# Patient Record
Sex: Female | Born: 1940 | Race: White | Hispanic: No | Marital: Married | Smoking: Former smoker
Health system: Southern US, Community
[De-identification: ages and names within clinical notes are randomized; demographics above are authoritative.]

## PROBLEM LIST (undated history)

## (undated) DIAGNOSIS — I2510 Atherosclerotic heart disease of native coronary artery without angina pectoris: Secondary | ICD-10-CM

## (undated) DIAGNOSIS — J449 Chronic obstructive pulmonary disease, unspecified: Secondary | ICD-10-CM

## (undated) DIAGNOSIS — K219 Gastro-esophageal reflux disease without esophagitis: Secondary | ICD-10-CM

## (undated) DIAGNOSIS — R635 Abnormal weight gain: Secondary | ICD-10-CM

## (undated) DIAGNOSIS — J398 Other specified diseases of upper respiratory tract: Secondary | ICD-10-CM

## (undated) DIAGNOSIS — G4733 Obstructive sleep apnea (adult) (pediatric): Secondary | ICD-10-CM

## (undated) DIAGNOSIS — Z9289 Personal history of other medical treatment: Secondary | ICD-10-CM

## (undated) DIAGNOSIS — E119 Type 2 diabetes mellitus without complications: Secondary | ICD-10-CM

## (undated) DIAGNOSIS — M1990 Unspecified osteoarthritis, unspecified site: Secondary | ICD-10-CM

## (undated) DIAGNOSIS — K5790 Diverticulosis of intestine, part unspecified, without perforation or abscess without bleeding: Secondary | ICD-10-CM

## (undated) DIAGNOSIS — I10 Essential (primary) hypertension: Secondary | ICD-10-CM

## (undated) DIAGNOSIS — IMO0001 Reserved for inherently not codable concepts without codable children: Secondary | ICD-10-CM

## (undated) DIAGNOSIS — Z9981 Dependence on supplemental oxygen: Secondary | ICD-10-CM

## (undated) DIAGNOSIS — E785 Hyperlipidemia, unspecified: Secondary | ICD-10-CM

## (undated) HISTORY — DX: Reserved for inherently not codable concepts without codable children: IMO0001

## (undated) HISTORY — DX: Obstructive sleep apnea (adult) (pediatric): G47.33

## (undated) HISTORY — DX: Diverticulosis of intestine, part unspecified, without perforation or abscess without bleeding: K57.90

## (undated) HISTORY — DX: Essential (primary) hypertension: I10

## (undated) HISTORY — DX: Hyperlipidemia, unspecified: E78.5

## (undated) HISTORY — DX: Atherosclerotic heart disease of native coronary artery without angina pectoris: I25.10

## (undated) HISTORY — PX: TUBAL LIGATION: SHX77

## (undated) HISTORY — DX: Type 2 diabetes mellitus without complications: E11.9

## (undated) HISTORY — DX: Abnormal weight gain: R63.5

## (undated) HISTORY — DX: Gastro-esophageal reflux disease without esophagitis: K21.9

---

## 1997-11-26 ENCOUNTER — Emergency Department (HOSPITAL_COMMUNITY): Admission: EM | Admit: 1997-11-26 | Discharge: 1997-11-26 | Payer: Self-pay | Admitting: Emergency Medicine

## 1999-01-24 ENCOUNTER — Encounter: Payer: Self-pay | Admitting: Internal Medicine

## 1999-01-24 ENCOUNTER — Ambulatory Visit (HOSPITAL_COMMUNITY): Admission: RE | Admit: 1999-01-24 | Discharge: 1999-01-24 | Payer: Self-pay | Admitting: Internal Medicine

## 1999-01-28 ENCOUNTER — Ambulatory Visit (HOSPITAL_COMMUNITY): Admission: RE | Admit: 1999-01-28 | Discharge: 1999-01-28 | Payer: Self-pay | Admitting: Internal Medicine

## 1999-03-09 ENCOUNTER — Other Ambulatory Visit: Admission: RE | Admit: 1999-03-09 | Discharge: 1999-03-09 | Payer: Self-pay | Admitting: Obstetrics and Gynecology

## 1999-03-09 ENCOUNTER — Encounter (INDEPENDENT_AMBULATORY_CARE_PROVIDER_SITE_OTHER): Payer: Self-pay

## 2004-09-29 ENCOUNTER — Encounter: Admission: RE | Admit: 2004-09-29 | Discharge: 2004-09-29 | Payer: Self-pay | Admitting: Family Medicine

## 2005-10-05 ENCOUNTER — Encounter: Admission: RE | Admit: 2005-10-05 | Discharge: 2005-10-05 | Payer: Self-pay | Admitting: Family Medicine

## 2006-08-17 ENCOUNTER — Emergency Department (HOSPITAL_COMMUNITY): Admission: EM | Admit: 2006-08-17 | Discharge: 2006-08-17 | Payer: Self-pay | Admitting: Emergency Medicine

## 2006-08-18 ENCOUNTER — Inpatient Hospital Stay (HOSPITAL_COMMUNITY): Admission: EM | Admit: 2006-08-18 | Discharge: 2006-08-24 | Payer: Self-pay | Admitting: Emergency Medicine

## 2006-08-18 ENCOUNTER — Ambulatory Visit: Payer: Self-pay | Admitting: Pulmonary Disease

## 2006-08-20 ENCOUNTER — Encounter: Payer: Self-pay | Admitting: Emergency Medicine

## 2006-08-21 ENCOUNTER — Encounter (INDEPENDENT_AMBULATORY_CARE_PROVIDER_SITE_OTHER): Payer: Self-pay | Admitting: Internal Medicine

## 2006-08-23 ENCOUNTER — Encounter: Payer: Self-pay | Admitting: Pulmonary Disease

## 2006-09-07 ENCOUNTER — Ambulatory Visit: Payer: Self-pay | Admitting: Pulmonary Disease

## 2006-09-19 ENCOUNTER — Ambulatory Visit: Payer: Self-pay | Admitting: Cardiology

## 2006-09-25 ENCOUNTER — Ambulatory Visit: Payer: Self-pay | Admitting: Pulmonary Disease

## 2006-09-26 ENCOUNTER — Encounter: Payer: Self-pay | Admitting: Pulmonary Disease

## 2006-09-26 ENCOUNTER — Ambulatory Visit (HOSPITAL_BASED_OUTPATIENT_CLINIC_OR_DEPARTMENT_OTHER): Admission: RE | Admit: 2006-09-26 | Discharge: 2006-09-26 | Payer: Self-pay | Admitting: Pulmonary Disease

## 2006-10-03 ENCOUNTER — Ambulatory Visit: Payer: Self-pay | Admitting: Pulmonary Disease

## 2006-10-08 ENCOUNTER — Ambulatory Visit: Payer: Self-pay

## 2006-10-08 ENCOUNTER — Encounter: Payer: Self-pay | Admitting: Cardiology

## 2006-10-16 ENCOUNTER — Ambulatory Visit: Admission: RE | Admit: 2006-10-16 | Discharge: 2006-10-16 | Payer: Self-pay | Admitting: Critical Care Medicine

## 2006-10-16 ENCOUNTER — Encounter: Payer: Self-pay | Admitting: Emergency Medicine

## 2006-10-16 ENCOUNTER — Ambulatory Visit: Payer: Self-pay | Admitting: Critical Care Medicine

## 2006-10-24 ENCOUNTER — Ambulatory Visit: Payer: Self-pay | Admitting: Pulmonary Disease

## 2006-10-26 ENCOUNTER — Ambulatory Visit: Admission: RE | Admit: 2006-10-26 | Discharge: 2006-10-26 | Payer: Self-pay | Admitting: *Deleted

## 2006-10-26 ENCOUNTER — Ambulatory Visit: Payer: Self-pay | Admitting: Pulmonary Disease

## 2006-10-26 ENCOUNTER — Encounter: Payer: Self-pay | Admitting: Pulmonary Disease

## 2006-10-31 ENCOUNTER — Ambulatory Visit: Payer: Self-pay | Admitting: Critical Care Medicine

## 2006-11-26 ENCOUNTER — Ambulatory Visit: Payer: Self-pay | Admitting: Pulmonary Disease

## 2006-12-04 ENCOUNTER — Encounter (HOSPITAL_COMMUNITY): Admission: RE | Admit: 2006-12-04 | Discharge: 2007-02-12 | Payer: Self-pay | Admitting: Pulmonary Disease

## 2006-12-04 ENCOUNTER — Encounter: Payer: Self-pay | Admitting: Pulmonary Disease

## 2006-12-31 ENCOUNTER — Telehealth: Payer: Self-pay | Admitting: Pulmonary Disease

## 2007-01-01 DIAGNOSIS — R635 Abnormal weight gain: Secondary | ICD-10-CM | POA: Insufficient documentation

## 2007-01-01 DIAGNOSIS — J8410 Pulmonary fibrosis, unspecified: Secondary | ICD-10-CM | POA: Insufficient documentation

## 2007-01-01 DIAGNOSIS — G4733 Obstructive sleep apnea (adult) (pediatric): Secondary | ICD-10-CM | POA: Insufficient documentation

## 2007-01-07 ENCOUNTER — Telehealth: Payer: Self-pay | Admitting: Pulmonary Disease

## 2007-01-08 ENCOUNTER — Telehealth (INDEPENDENT_AMBULATORY_CARE_PROVIDER_SITE_OTHER): Payer: Self-pay | Admitting: *Deleted

## 2007-01-15 ENCOUNTER — Telehealth (INDEPENDENT_AMBULATORY_CARE_PROVIDER_SITE_OTHER): Payer: Self-pay | Admitting: *Deleted

## 2007-02-14 ENCOUNTER — Encounter (HOSPITAL_COMMUNITY): Admission: RE | Admit: 2007-02-14 | Discharge: 2007-05-09 | Payer: Self-pay | Admitting: Pulmonary Disease

## 2007-02-15 ENCOUNTER — Ambulatory Visit: Payer: Self-pay | Admitting: Pulmonary Disease

## 2007-02-15 DIAGNOSIS — Q318 Other congenital malformations of larynx: Secondary | ICD-10-CM | POA: Insufficient documentation

## 2007-02-15 DIAGNOSIS — Q349 Congenital malformation of respiratory system, unspecified: Secondary | ICD-10-CM

## 2007-02-15 LAB — CONVERTED CEMR LAB
BUN: 19 mg/dL (ref 6–23)
CO2: 35 meq/L — ABNORMAL HIGH (ref 19–32)
Calcium: 10.2 mg/dL (ref 8.4–10.5)
Chloride: 103 meq/L (ref 96–112)
Creatinine, Ser: 0.5 mg/dL (ref 0.4–1.2)

## 2007-04-02 ENCOUNTER — Ambulatory Visit (HOSPITAL_COMMUNITY): Admission: RE | Admit: 2007-04-02 | Discharge: 2007-04-02 | Payer: Self-pay | Admitting: Pulmonary Disease

## 2007-04-03 ENCOUNTER — Encounter: Payer: Self-pay | Admitting: Pulmonary Disease

## 2007-04-22 ENCOUNTER — Ambulatory Visit: Payer: Self-pay | Admitting: Pulmonary Disease

## 2007-09-09 ENCOUNTER — Encounter: Admission: RE | Admit: 2007-09-09 | Discharge: 2007-09-09 | Payer: Self-pay | Admitting: Family Medicine

## 2007-10-14 ENCOUNTER — Ambulatory Visit: Payer: Self-pay | Admitting: Pulmonary Disease

## 2008-05-19 ENCOUNTER — Ambulatory Visit: Payer: Self-pay | Admitting: Pulmonary Disease

## 2008-05-20 ENCOUNTER — Ambulatory Visit: Payer: Self-pay | Admitting: Cardiology

## 2008-05-21 ENCOUNTER — Telehealth: Payer: Self-pay | Admitting: Pulmonary Disease

## 2008-09-09 ENCOUNTER — Encounter: Admission: RE | Admit: 2008-09-09 | Discharge: 2008-09-09 | Payer: Self-pay | Admitting: Family Medicine

## 2008-11-27 ENCOUNTER — Ambulatory Visit: Payer: Self-pay | Admitting: Pulmonary Disease

## 2009-03-08 ENCOUNTER — Encounter: Payer: Self-pay | Admitting: Pulmonary Disease

## 2009-04-17 ENCOUNTER — Emergency Department (HOSPITAL_COMMUNITY): Admission: EM | Admit: 2009-04-17 | Discharge: 2009-04-17 | Payer: Self-pay | Admitting: Family Medicine

## 2009-06-01 ENCOUNTER — Ambulatory Visit: Payer: Self-pay | Admitting: Pulmonary Disease

## 2009-06-29 ENCOUNTER — Ambulatory Visit: Payer: Self-pay | Admitting: Pulmonary Disease

## 2009-06-30 DIAGNOSIS — R490 Dysphonia: Secondary | ICD-10-CM | POA: Insufficient documentation

## 2009-07-27 ENCOUNTER — Ambulatory Visit: Payer: Self-pay | Admitting: Pulmonary Disease

## 2009-09-10 ENCOUNTER — Encounter: Admission: RE | Admit: 2009-09-10 | Discharge: 2009-09-10 | Payer: Self-pay | Admitting: Family Medicine

## 2009-09-15 ENCOUNTER — Encounter: Admission: RE | Admit: 2009-09-15 | Discharge: 2009-09-15 | Payer: Self-pay | Admitting: Family Medicine

## 2010-03-06 ENCOUNTER — Encounter: Payer: Self-pay | Admitting: Pulmonary Disease

## 2010-03-15 NOTE — Assessment & Plan Note (Signed)
Summary: ROV 4 MONTHS///KP   Primary Provider/Referring Provider:  Dr. Lynnea Ferrier, [facility]  CC:  Pt is here for a routine f/u appt.  Pt states she is using her bipap machine every night.  Pt denied any complaints with bipap machine.  Pt states breathing is unchanged from last visit.  Pt does c/o hoarseness x 1 month.   Pt states occ non-productive cough. .  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 Improved with BiPAP. HRCT 4/10 did not show any evidence of pulmonary fibrosis 8/09 Desaturates on exertion. walks on a treadmill x 30 mins  4/10 >>Dyspnea at baseline. Still unable to lie flat but can sleep with CPAP. Heartburn 'not bad'. Has changed to generic metformin 1000 two times a day _-nausea & lisinopril for avapro (doughnut hole) Cough 'sometimes', hoarse voice  June 01, 2009 3:50 PM  c/o hoarseness, otherwise doing well. Could not afford avapro Still working 4 h/day, continues with exercise regimen 5 ds/ wk Compliant with BiPAP, cannot lie flat for longer duration without it. Did not desaturate on exertion   Current Medications (verified): 1)  Simvastatin 80 Mg  Tabs (Simvastatin) .... Take 1 Tab By Mouth At Bedtime 2)  Metformin Hcl 500 Mg Tabs (Metformin Hcl) .... Take 1 Tablet By Mouth Two Times A Day 3)  Gabapentin 600 Mg  Tabs (Gabapentin) .... Take 1 Tablet By Mouth Four Times A Day 4)  Adult Aspirin Low Strength 81 Mg  Tbdp (Aspirin) .... Take 1 Tablet By Mouth Once A Day 5)  Oscal 500/200 D-3 500-200 Mg-Unit  Tabs (Calcium-Vitamin D) .... Take 1 Tablet By Mouth Once A Day 6)  Spiriva Handihaler 18 Mcg  Caps (Tiotropium Bromide Monohydrate) .Marland Kitchen.. 1 Capsule Once Daily 7)  Vitamin C .... As Needed 8)  Antioxidant A/c/e   Tabs (Multiple Vitamin) .... Once Daily 9)  Bipap .... +10/5 10)  Lisinopril 20 Mg Tabs (Lisinopril) .... Take 1 Tablet By Mouth  Once A Day 11)  Vitamin B-12 1000 Mcg Tabs (Cyanocobalamin) .... Take 1 Tablet By Mouth Once A Day 12)  Vitamin B-6 100 Mg Tabs (Pyridoxine Hcl) .... Take 1 Tablet By Mouth Once A Day  Allergies (verified): No Known Drug Allergies  Past History:  Social History: Last updated: 06/01/2009 quit smoking '88. 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  A sleep study did reveal mild obstructive sleep apnea.  She has been able to use her BiPAP  +10/5 & is able to sleep supine.   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  HRCT in 2/09 >> Large hiatal hernia. Stable overall appearance of the lungs with mild nonspecific interstitial process but no acute overlying alveolitis or pulmonary edema or infiltrate.  PFTs 8/09 >> marked improvment, FEV1% was 78, FEV68 %, FVC 63%, TLC 67%, DLCO remains 51% Work exposure to chemicals - has been outlined for her school.  Social History: quit smoking '88. max 1.5 PPD, started @ 19 = 20 Pyrs  Review of Systems  The patient complains of dyspnea on exertion.  The patient denies anorexia, fever, weight loss, weight gain, vision loss, decreased hearing, hoarseness, chest pain, syncope, peripheral edema, prolonged cough, headaches, hemoptysis, abdominal pain, melena, hematochezia, severe indigestion/heartburn, hematuria, muscle weakness, difficulty walking, depression, unusual weight change, and abnormal bleeding.    Vital Signs:  Patient profile:   70 year old female Height:      62 inches Weight:      181.13  pounds BMI:     33.25 O2 Sat:      94 % on Room air Temp:     98.1 degrees F oral Pulse rate:   97 / minute BP sitting:   118 / 72  (right arm) Cuff size:   regular  Vitals Entered By: Arman Filter LPN (June 01, 2009 3:39 PM)  O2 Flow:  Room air  Serial Vital Signs/Assessments:  Comments: 4:38 PM Ambulatory Pulse Oximetry  Resting; HR__101___    02 Sat__96%ra___  Lap1 (185 feet)   HR___108__   02 Sat___96%ra__ Lap2 (185 feet)   HR___113__   02 Sat__93%ra___    Lap3 (185 feet)   HR___112__   02 Sat__95%ra___  _X_Test Completed without Difficulty ___Test Stopped due to:  Arman Filter LPN  June 01, 2009 4:39 PM  By: Arman Filter LPN   CC: Pt is here for a routine f/u appt.  Pt states she is using her bipap machine every night.  Pt denied any complaints with bipap machine.  Pt states breathing is unchanged from last visit.  Pt does c/o hoarseness x 1 month.   Pt states occ non-productive cough.  Comments Medications reviewed with patient Arman Filter LPN  June 01, 2009 3:43 PM    Physical Exam  Additional Exam:  Gen. 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 Patient Level III (57846) Prescription Created Electronically [phone]) Pulse Oximetry, Ambulatory (28413)  Problem # 2:  HYPERTENSION (ICD-401.9)  Change to atenolol FU in 1 mnth for BP chk The following medications were removed from the medication list:     Lisinopril 20 Mg Tabs (Lisinopril) .Marland Kitchen... Take 1 tablet by mouth once a day Her updated medication list for this problem includes:    Atenolol 25 Mg Tabs (Atenolol) ..... Once daily  Orders: Est. Patient Level III (24401) Prescription Created Electronically [phone] Pulse Oximetry, Ambulatory (36644)  Medications Added to Medication List This Visit: 1)  Metformin Hcl 500 Mg Tabs (Metformin hcl) .... Take 1 tablet by mouth two times a day 2)  Atenolol 25 Mg Tabs (Atenolol) .... Once daily  Patient Instructions: 1)  Copy sent I[facility]:[id number] [place] [facility] 2)  Please schedule a follow-up appointment in 1 month with TP 3)  STOP lisinopril 4)  Use atenolol 25 once daily , check BP every other day , call me if BP more than 140/90 Prescriptions: ATENOLOL 25 MG TABS (ATENOLOL) once daily  #30 x 1   Entered and Authorized by:   Comer Locket Vassie Loll MD   Signed  by:   Comer Locket Vassie Loll MD on 06/01/2009   Method used:   [facility]        [facility]  [address] [id number]       [address]       [place], [place]  [address]       Ph: [phone]       Fax: [phone]   RxID:   [id number]

## 2010-03-15 NOTE — Assessment & Plan Note (Signed)
Summary: NP follow up - BP med change   Primary Provider/Referring Provider:  Dr. Lynnea Ferrier, [facility]  CC:  1 month follow up for med change to atenolol - pt states she is still hoarse.  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 Improved with BiPAP. HRCT 4/10 did not show any evidence of pulmonary fibrosis 8/09 Desaturates on exertion. walks on a treadmill x 30 mins  4/10 >>Dyspnea at baseline. Still unable to lie flat but can sleep with CPAP. Heartburn 'not bad'. Has changed to generic metformin 1000 two times a day _-nausea & lisinopril for avapro (doughnut hole) Cough 'sometimes', hoarse voice  June 01, 2009 3:50 PM  c/o hoarseness, otherwise doing well. Could not afford avapro Still working 4 h/day, continues with exercise regimen 5 ds/ wk Compliant with BiPAP, cannot lie flat for longer duration without it. Did not desaturate on exertion  May 17, 201--Returns for 1 month follow up for med change to atenolol. Last visit w/ chnaged off ACE inhibitor Lisinopril to Atenolol due to hoarseness. She is some better , hoarsenss is not completely gone but better. She does have some drainage intermittently. Denies chest pain, dyspnea, orthopnea, hemoptysis, fever, n/v/d, edema, headache.    Medications Prior to Update: 1)  Simvastatin 80 Mg  Tabs (Simvastatin) .... Take 1 Tab By Mouth At Bedtime 2)  Metformin Hcl 500 Mg Tabs (Metformin Hcl) .... Take 1 Tablet By Mouth Two Times A Day 3)  Gabapentin 600 Mg  Tabs (Gabapentin) .... Take 1 Tablet By Mouth Four Times A Day 4)  Adult Aspirin Low Strength 81 Mg  Tbdp (Aspirin) .... Take 1 Tablet By Mouth Once A Day 5)  Oscal 500/200 D-3 500-200 Mg-Unit  Tabs (Calcium-Vitamin D) .... Take 1 Tablet By Mouth Once A Day 6)  Spiriva Handihaler 18 Mcg  Caps (Tiotropium Bromide Monohydrate) .Marland Kitchen.. 1 Capsule Once Daily 7)  Vitamin  C .... As Needed 8)  Antioxidant A/c/e   Tabs (Multiple Vitamin) .... Once Daily 9)  Bipap .... +10/5 10)  Vitamin B-12 1000 Mcg Tabs (Cyanocobalamin) .... Take 1 Tablet By Mouth Once A Day 11)  Vitamin B-6 100 Mg Tabs (Pyridoxine Hcl) .... Take 1 Tablet By Mouth Once A Day 12)  Atenolol 25 Mg Tabs (Atenolol) .... Once Daily  Allergies: No Known Drug Allergies  Past History:  Past Medical History: Last updated: 06/01/2009 Current Problems:  WEIGHT GAIN (ICD-783.1) OBSTRUCTIVE SLEEP APNEA (ICD-327.23) Hypertension  Family History: Last updated: 06/29/2009 asthma - MGM heart disease - mother, MGM, MGF clotting disorders - mother DM - brother, pat aunts x4  Social History: Last updated: 06/29/2009 quit smoking '88. 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  A sleep study did reveal mild obstructive sleep apnea.  She has been able to use her BiPAP  +10/5 & is able to sleep supine.   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  HRCT in 2/09 >> Large hiatal hernia. Stable  overall appearance of the lungs with mild nonspecific interstitial process but no acute overlying alveolitis or pulmonary edema or infiltrate.  PFTs 8/09 >> marked improvment, FEV1% was 78, FEV68 %, FVC 63%, TLC 67%, DLCO remains 51% Work exposure to chemicals - has been outlined for her school.  Family History: asthma - MGM heart disease - mother, MGM, MGF clotting disorders - mother DM - brother, pat aunts x4  Social History: quit  smoking '88. 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 Yes Comments Medications reviewed with patient Daytime contact number verified with patient. Boone Master CNA  Jun 29, 2009 3:50 PM    Physical Exam  Additional Exam:  Gen. Pleasant, well-nourished, in no distress, mild hoarseness, ENT - no lesions, no post nasal drip Neck: No JVD, no thyromegaly, no carotid bruits Lungs: no use of accessory muscles, no dullness to percussion,clear, no rhonchi  Cardiovascular: Rhythm regular, heart sounds  normal, no murmurs or gallops, no peripheral edema Musculoskeletal: No deformities, no cyanosis or clubbing       Impression & Recommendations:  Problem # 1:  HYPERTENSION (ICD-401.9)  controlled w/ recent change in meds . Now off ace inhibitor.  cont on current meds.   Her updated medication list for this problem includes:    Atenolol 25 Mg Tabs (Atenolol) ..... Once daily  Orders: Est. Patient Level IV (16109)  Problem # 2:  HOARSENESS (UEA-540.98)   ? etiology possible 2/2 ACE vs rhinitis vs GERD REC: if not improving may need referral to ENT to evaluate Zyrtec 10mg  at bedtime for 1 week then as needed for draiange Saline nasal rinses as needed nasal congestion  Dexilant 60mg  once daily until seen back in office .  Continue on Atenolol once daily  Avoid clearing throat, use sugarless candy, ice chips and water. NO MINTS.  follow up  3 weeks Nickole Adamek NP  Please contact office for sooner follow up if symptoms do not improve or worsen   Orders: Est. Patient Level IV (11914)  Complete Medication List: 1)  Simvastatin 80 Mg Tabs (Simvastatin) .... Take 1 tab by mouth at bedtime 2)  Metformin Hcl 500 Mg Tabs (Metformin hcl) .... Take 1 tablet by mouth two times a day 3)  Gabapentin 600 Mg Tabs (Gabapentin) .... Take 1 tablet by mouth four times a day 4)  Adult Aspirin Low Strength 81 Mg Tbdp (Aspirin) .... Take 1 tablet by mouth once a day 5)  Oscal 500/200 D-3 500-200 Mg-unit Tabs (Calcium-vitamin d) .... Take 1 tablet by mouth once a day 6)  Spiriva Handihaler 18 Mcg Caps (Tiotropium bromide monohydrate) .Marland Kitchen.. 1 capsule once daily 7)  Vitamin C  .... As  needed 8)  Antioxidant A/c/e Tabs (Multiple vitamin) .... Once daily 9)  Bipap  .... +10/5 10)  Vitamin B-12 1000 Mcg Tabs (Cyanocobalamin) .... Take 1 tablet by mouth once a day 11)  Vitamin B-6 100 Mg Tabs (Pyridoxine hcl) .... Take 1 tablet by mouth once a day 12)  Atenolol 25 Mg Tabs (Atenolol) .... Once daily  Patient Instructions: 1)  Zyrtec 10mg  at bedtime for 1 week then as needed for draiange 2)  Saline nasal rinses as needed nasal congestion  3)  Dexilant 60mg  once daily until seen back in office .  4)  Continue on Atenolol once daily  5)  Avoid clearing throat, use sugarless candy, ice chips and water. NO MINTS.  6)  follow up 3 weeks Malakhi Markwood NP  7)  Please contact office for sooner follow up if symptoms do not improve or worsen

## 2010-03-15 NOTE — Letter (Signed)
Summary: CMN for CPAP Supplies/[facility]  CMN for CPAP Supplies/[facility]   Imported By: Sherian Rein 03/13/2009 08:19:33  _____________________________________________________________________  External Attachment:    Type:   Image     Comment:   External Document

## 2010-03-15 NOTE — Assessment & Plan Note (Signed)
Summary: NP follow up - BP med follow up   Primary Provider/Referring Provider:  Dr. Lynnea Ferrier, [facility]  CC:  3 week follow up for hoarseness w/ BP med change to atenolol.  states hoarseness has improved and believes it was from allergies.  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 Improved with BiPAP. HRCT 4/10 did not show any evidence of pulmonary fibrosis 8/09 Desaturates on exertion. walks on a treadmill x 30 mins  4/10 >>Dyspnea at baseline. Still unable to lie flat but can sleep with CPAP. Heartburn 'not bad'. Has changed to generic metformin 1000 two times a day _-nausea & lisinopril for avapro (doughnut hole) Cough 'sometimes', hoarse voice  June 01, 2009 3:50 PM  c/o hoarseness, otherwise doing well. Could not afford avapro Still working 4 h/day, continues with exercise regimen 5 ds/ wk Compliant with BiPAP, cannot lie flat for longer duration without it. Did not desaturate on exertion  May 17, 201--Returns for 1 month follow up for med change to atenolol. Last visit w/ chnaged off ACE inhibitor Lisinopril to Atenolol due to hoarseness. She is some better , hoarsenss is not completely gone but better. She does have some drainage intermittently.   July 27, 2009--Returns for 3 week follow up for hoarseness. Last visit she was doing well w/ change from ACE inhibitor to Atenolol however hoarseness did not totally resolve. She was recommended to use zyrtec and PPI for possible rhinitis and GERD. She did use dexilant for 2 weeks and zyrtec occasionally. Hoarseness is better but has not resolved. She gets it on most days-it comes and goes. Does have occasional GERD. On previous CT showed large Hiatal hernia. Denies chest pain, dyspnea, orthopnea, hemoptysis, fever, n/v/d, edema, headache, disclored mucus.   Medications Prior to Update: 1)  Simvastatin 80  Mg  Tabs (Simvastatin) .... Take 1 Tab By Mouth At Bedtime 2)  Metformin Hcl 500 Mg Tabs (Metformin Hcl) .... Take 1 Tablet By Mouth Two Times A Day 3)  Gabapentin 600 Mg  Tabs (Gabapentin) .... Take 1 Tablet By Mouth Four Times A Day 4)  Adult Aspirin Low Strength 81 Mg  Tbdp (Aspirin) .... Take 1 Tablet By Mouth Once A Day 5)  Oscal 500/200 D-3 500-200 Mg-Unit  Tabs (Calcium-Vitamin D) .... Take 1 Tablet By Mouth Once A Day 6)  Spiriva Handihaler 18 Mcg  Caps (Tiotropium Bromide Monohydrate) .Marland Kitchen.. 1 Capsule Once Daily 7)  Vitamin C .... As Needed 8)  Antioxidant A/c/e   Tabs (Multiple Vitamin) .... Once Daily 9)  Bipap .... +10/5 10)  Vitamin B-12 1000 Mcg Tabs (Cyanocobalamin) .... Take 1 Tablet By Mouth Once A Day 11)  Vitamin B-6 100 Mg Tabs (Pyridoxine Hcl) .... Take 1 Tablet By Mouth Once A Day  Allergies: No Known Drug Allergies  Past History:  Past Medical History: Last updated: 06/01/2009 Current Problems:  WEIGHT GAIN (ICD-783.1) OBSTRUCTIVE SLEEP APNEA (ICD-327.23) Hypertension  Family History: Last updated: 06/29/2009 asthma - MGM heart disease - mother, MGM, MGF clotting disorders - mother DM - brother, pat aunts x4  Social History: Last updated: 06/29/2009 quit smoking '88. 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  A sleep study did reveal mild obstructive sleep apnea.  She has been able to use her BiPAP  +10/5 & is able to sleep supine.   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  HRCT  in 2/09 >> Large hiatal hernia. Stable overall appearance of the lungs with mild nonspecific interstitial process but no acute overlying alveolitis or pulmonary edema or infiltrate.  PFTs 8/09 >> marked improvment, FEV1% was 78, FEV68 %, FVC 63%, TLC 67%, DLCO remains 51% Work exposure to chemicals - has been outlined for her school.  Review of Systems      See HPI  Vital Signs:  Patient profile:   70 year old female Height:      62 inches Weight:      178 pounds BMI:     32.67 O2 Sat:      92 % on Room air Temp:     97.6 degrees F oral Pulse rate:   68 / minute BP sitting:   108 / 72  (left arm) Cuff size:   regular  Vitals Entered By: Boone Master CNA/MA (July 27, 2009 9:55 AM)  O2 Flow:  Room air CC: 3 week follow up for hoarseness w/ BP med change to atenolol.  states hoarseness has improved and believes it was from allergies Is Patient Diabetic? No Comments Medications reviewed with patient Daytime contact number verified with patient. Boone Master CNA/MA  July 27, 2009 9:56 AM  .p  Physical Exam  Additional Exam:  Gen. Pleasant, well-nourished, in no distress, mild hoarseness, ENT - no lesions in post pharynx, clear drainage noted. nontender sinus, TM normal.  Neck: No JVD, no thyromegaly, no carotid bruits, no adenopathy paplataed.  Lungs: no use of accessory muscles, no dullness to percussion,clear, no rhonchi  Cardiovascular: Rhythm regular, heart sounds  normal, no murmurs or gallops, no peripheral edema ABD: soft and nontender  Musculoskeletal: No deformities, no cyanosis or clubbing       Impression & Recommendations:  Problem # 1:  HOARSENESS (ONG-295.28)   ? etiology . she is improved but not totally resolved w/ discontinuation of ACE.  I do not think she has maximized her rhinitis/ GERD prevention regimen.  However she is a former smoker, will refer to  ENT to evaluate for persistent hoarseness.  REC:  Zyrtec 10mg  at bedtime for 1 week then as needed  for draiange Saline nasal rinses as needed nasal congestion  Prilosec daily , GERD diet  .  Continue on Atenolol once daily  Avoid clearing throat, use sugarless candy, ice chips and water. NO MINTS.     Orders: ENT Referral (ENT) Est. Patient Level IV (41324)  Problem # 2:  HYPERTENSION (ICD-401.9)  controlled on rx follow up PCP for further management  would avoid ace inhibitors in future.  atenolol refills sent today.  Her updated medication list for this problem includes:    Atenolol 25 Mg Tabs (Atenolol) .Marland Kitchen... 1 by mouth once daily  Orders: Est. Patient Level IV (40102)  Problem # 3:  INTERSTITIAL LUNG DISEASE (ICD-515) No evidence of worsening , no further CTs needed she is doing well follow up Dr. Vassie Loll in 3 months.   Problem # 4:  OBSTRUCTIVE SLEEP APNEA (ICD-327.23)  cont on nocturnal bipap  Orders: Est. Patient  Level IV (62130)  Medications Added to Medication List This Visit: 1)  Atenolol 25 Mg Tabs (Atenolol) .Marland Kitchen.. 1 by mouth once daily 2)  Vitamin C 500 Mg Tabs (Ascorbic acid) .... As needed  Complete Medication List: 1)  Simvastatin 80 Mg Tabs (Simvastatin) .... Take 1 tab by mouth at bedtime 2)  Atenolol 25 Mg Tabs (Atenolol) .Marland Kitchen.. 1 by mouth once daily 3)  Metformin Hcl 500 Mg Tabs (Metformin hcl) .... Take 1 tablet by mouth two times a day 4)  Gabapentin 600 Mg Tabs (Gabapentin) .... Take 1 tablet by mouth four times a day 5)  Adult Aspirin Low Strength 81 Mg Tbdp (Aspirin) .... Take 1 tablet by mouth once a day 6)  Oscal 500/200 D-3 500-200 Mg-unit Tabs (Calcium-vitamin d) .... Take 1 tablet by mouth once a day 7)  Spiriva Handihaler 18 Mcg Caps (Tiotropium bromide monohydrate) .Marland Kitchen.. 1 capsule once daily 8)  Vitamin C 500 Mg Tabs (Ascorbic acid) .... As needed 9)  Antioxidant A/c/e Tabs (Multiple vitamin) .... Once daily 10)  Bipap  .... +10/5 11)  Vitamin B-12 1000 Mcg Tabs (Cyanocobalamin) .... Take 1 tablet by mouth once a day 12)  Vitamin B-6 100  Mg Tabs (Pyridoxine hcl) .... Take 1 tablet by mouth once a day  Patient Instructions: 1)  Zyrtec 10mg  at bedtime for 1 week then as needed for draiange 2)  Saline nasal rinses as needed nasal congestion  3)  Begin Prilosec 20mg  once daily before meal.  4)  Continue on Atenolol once daily  5)  We are referring you to ENT (throat specialist) to evaluate your hoarseness.  6)  Avoid clearing throat, use sugarless candy, ice chips and water. NO MINTS.  7)  follow up  3-4 months Dr. Vassie Loll  8)  Please contact office for sooner follow up if symptoms do not improve or worsen  Prescriptions: ATENOLOL 25 MG TABS (ATENOLOL) 1 by mouth once daily  #30 x 5   Entered and Authorized by:   Rubye Oaks NP   Signed by:   Denham Mose NP on 07/27/2009   Method used:   [facility] to        [facility]  [address] [id number]       [address]       [place], [place]  [address]       Ph: [phone]       Fax: [phone]   RxID:   [id number]     Appended Document: NP follow up - BP med follow up faxed via biscom to Dr. Suzanna Obey for upcoming appt 08-20-09 @1 :20pm.  ov note printed off and faxed to pt's PCP Lynnea Ferrier @ [facility] (P) [phone].

## 2010-03-24 ENCOUNTER — Ambulatory Visit (INDEPENDENT_AMBULATORY_CARE_PROVIDER_SITE_OTHER): Payer: Medicare Other | Admitting: Pulmonary Disease

## 2010-03-24 ENCOUNTER — Encounter: Payer: Self-pay | Admitting: Pulmonary Disease

## 2010-03-24 DIAGNOSIS — G4733 Obstructive sleep apnea (adult) (pediatric): Secondary | ICD-10-CM

## 2010-03-24 DIAGNOSIS — Q318 Other congenital malformations of larynx: Secondary | ICD-10-CM

## 2010-03-31 ENCOUNTER — Encounter: Payer: Self-pay | Admitting: Pulmonary Disease

## 2010-03-31 NOTE — Assessment & Plan Note (Addendum)
Summary: 7 mth follow-up//jj   Visit Type:  Follow-up Primary Provider/Referring Provider:  Dr. Lynnea Ferrier, [facility]  CC:  OSA follow up. Pt states no new complaints. Using BiPAP all night. Mask not fitting well feels like it is leaking.  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 Improved with BiPAP. HRCT 4/10 did not show any evidence of pulmonary fibrosis 8/09 Desaturates on exertion. walks on a treadmill x 30 mins   March 24, 2010 4:25 PM  Still working 4 h/day, continues with exercise regimen 5 ds/ wk Compliant with BiPAP, cannot lie flat for longer duration without it. Did not desaturate on exertion Hoarseness resolved  - lisinopril stopped, neg ENt evaluation Jearld Fenton) Does have occasional GERD. On previous CT showed large Hiatal hernia. Denies chest pain, dyspnea, orthopnea, hemoptysis, fever, n/v/d, edema, headache, disclored mucus.   Preventive Screening-Counseling & Management  Alcohol-Tobacco     Smoking Status: quit 1988     Packs/Day: 1-1.5     Year Quit: 1988     Pack years: 25  Current Medications (verified): 1)  Simvastatin 80 Mg  Tabs (Simvastatin) .... Take 1 Tab By Mouth At Bedtime 2)  Atenolol 25 Mg Tabs (Atenolol) .Marland Kitchen.. 1 By Mouth Once Daily 3)  Metformin Hcl 500 Mg Tabs (Metformin Hcl) .... Take 1 Tablet By Mouth Two Times A Day 4)  Gabapentin 600 Mg  Tabs (Gabapentin) .... Take 1 Tablet By Mouth Four Times A Day 5)  Adult Aspirin Low Strength 81 Mg  Tbdp (Aspirin) .... Take 1 Tablet By Mouth Once A Day 6)  Oscal 500/200 D-3 500-200 Mg-Unit  Tabs (Calcium-Vitamin D) .... Take 1 Tablet By Mouth Once A Day 7)  Spiriva Handihaler 18 Mcg  Caps (Tiotropium Bromide Monohydrate) .Marland Kitchen.. 1 Capsule Once Daily 8)  Vitamin C 500 Mg Tabs (Ascorbic Acid) .... As Needed 9)  Antioxidant A/c/e   Tabs (Multiple Vitamin) .... Once Daily 10)  Bipap  .... +10/5 11)  Vitamin B-12 1000 Mcg Tabs (Cyanocobalamin) .... Take 1 Tablet By Mouth Once A Day 12)  Vitamin B-6 100 Mg Tabs (Pyridoxine Hcl) .... Take 1 Tablet By Mouth Once A Day  Allergies (verified): No Known Drug Allergies  Past History:  Past Medical History: Last updated: 06/01/2009 Current Problems:  WEIGHT GAIN (ICD-783.1) OBSTRUCTIVE SLEEP APNEA (ICD-327.23) Hypertension  Social History: Last updated: 06/29/2009 quit smoking '88. max 1.5 PPD, started @ 19 = 20 Pyrs no alcohol  married 3 children works in a cafeteria at a local school  Past Pulmonary History:  Pulmonary History:  PFT  showed intraparenchymal restriction with a diffuse capacity of 49% and CT revealed mild bibasilar interstitial pulmonary fibrosis.  A sleep study did reveal mild obstructive sleep apnea.  On  BiPAP  +10/5 & is able to sleep supine.   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  HRCT in 2/09 >> Large hiatal hernia. Stable overall appearance of the lungs with mild nonspecific interstitial process but no acute overlying alveolitis or pulmonary edema or infiltrate.  PFTs 8/09 >> marked improvment, FEV1% was 78, FEV68 %, FVC 63%, TLC 67%, DLCO remains 51% Work exposure to chemicals - has been outlined for her school.  Review of Systems       The patient complains of dyspnea on exertion.  The patient denies anorexia, fever, weight loss, weight gain, vision loss, decreased hearing, hoarseness, chest pain, syncope, peripheral edema, prolonged cough, headaches, hemoptysis, abdominal pain, melena, hematochezia, severe indigestion/heartburn, hematuria, muscle weakness, suspicious skin lesions, transient blindness, difficulty walking, depression, unusual weight change, abnormal bleeding, enlarged lymph nodes, and angioedema.    Vital Signs:  Patient profile:   70 year old female Height:      62  inches Weight:      169.8 pounds BMI:     31.17 O2 Sat:      98 % on Room air Temp:     98.5 degrees F oral Pulse rate:   80 / minute BP sitting:   134 / 82  (left arm) Cuff size:   regular  Vitals Entered By: Zackery Barefoot CMA (March 24, 2010 3:33 PM)  O2 Flow:  Room air  Serial Vital Signs/Assessments:  Comments: Ambulatory Pulse Oximetry  Resting; HR__61___    02 Sat_95%RA____  Lap1 (185 feet)   HR__86___   02 Sat__94%RA___ Lap2 (185 feet)   HR__88___   02 Sat__92%RA___    Lap3 (185 feet)   HR__105___   02 Sat__93%RA___  _x__Test Completed without Difficulty Zackery Barefoot CMA  March 24, 2010 4:08 PM  ___Test Stopped due to:   By: Zackery Barefoot CMA   CC: OSA follow up. Pt states no new complaints. Using BiPAP all night. Mask not fitting well feels like it is leaking Comments Medications reviewed with patient Verified contact number and pharmacy with patient Zackery Barefoot CMA  March 24, 2010 3:33 PM    Physical Exam  Additional Exam:  Gen. Pleasant, well-nourished, in no distress, mild hoarseness, ENT - no lesions in post pharynx, clear drainage noted. nontender sinus, TM normal.  Neck: No JVD, no thyromegaly, no carotid bruits, no adenopathy paplataed.  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 Patient Level III (16109)  Problem # 2:  OBSTRUCTIVE SLEEP APNEA (ICD-327.23)  cont on nocturnal bipap  Orders: DME Referral (DME) Est.  Patient Level III (60454)  Patient Instructions: 1)  Copy sent to: [facility] [place] 2)  Please schedule a follow-up appointment in 6 months with TP 3)  Ambulatory satn & check oxygen levels durign sleep 4)  If both OK, will dc oxygen    Immunization History:  Influenza Immunization History:    Influenza:  historical (11/16/2009)  Pneumovax Immunization History:    Pneumovax:  historical (03/19/2006)    Appended Document: 7 mth follow-up//jj ONO shows on bipap/ RA  desaturation for about 6 mins - OK to stay off oxygen during sleep whiel on bipap  Appended Document: 7 mth follow-up//jj lmomtcb x 1  Appended Document: 7 mth follow-up//jj pt informed

## 2010-04-13 ENCOUNTER — Telehealth: Payer: Self-pay | Admitting: Pulmonary Disease

## 2010-04-21 NOTE — Procedures (Signed)
Summary: Oximetry/[facility]  Oximetry/[facility]   Imported By: Sherian Rein 04/14/2010 14:38:32  _____________________________________________________________________  External Attachment:    Type:   Image     Comment:   External Document

## 2010-04-21 NOTE — Progress Notes (Signed)
Summary: returned call  Phone Note Call from Patient Call back at Home Phone [phone]   Caller: Patient Call For: alva Summary of Call: pt returned call from Ruth r. (from yesterday per pt).  Initial call taken by: Tivis Ringer, CNA,  April 13, 2010 4:25 PM  Follow-up for Phone Call        see 04/11/10 append. Zackery Barefoot CMA  April 13, 2010 5:42 PM

## 2010-06-17 ENCOUNTER — Ambulatory Visit (INDEPENDENT_AMBULATORY_CARE_PROVIDER_SITE_OTHER): Payer: Medicare Other | Admitting: Pulmonary Disease

## 2010-06-17 ENCOUNTER — Encounter: Payer: Self-pay | Admitting: Pulmonary Disease

## 2010-06-17 ENCOUNTER — Ambulatory Visit (INDEPENDENT_AMBULATORY_CARE_PROVIDER_SITE_OTHER)
Admission: RE | Admit: 2010-06-17 | Discharge: 2010-06-17 | Disposition: A | Discharge status: Home / Self Care | Payer: Medicare Other | Source: Ambulatory Visit | Attending: Pulmonary Disease | Admitting: Pulmonary Disease

## 2010-06-17 VITALS — BP 132/74 | HR 93 | Temp 98.2°F | Ht 62.0 in | Wt 165.0 lb

## 2010-06-17 DIAGNOSIS — Q318 Other congenital malformations of larynx: Secondary | ICD-10-CM

## 2010-06-17 DIAGNOSIS — J40 Bronchitis, not specified as acute or chronic: Secondary | ICD-10-CM

## 2010-06-17 DIAGNOSIS — G4733 Obstructive sleep apnea (adult) (pediatric): Secondary | ICD-10-CM

## 2010-06-17 MED ORDER — PREDNISONE 10 MG PO TABS: ORAL_TABLET | ORAL | Status: DC

## 2010-06-17 NOTE — Progress Notes (Signed)
  Subjective:    Patient ID: April [name], female    DOB: October 19, 1940, 70 y.o.   MRN: [id number]  HPI Primary Provider/Referring Provider: Dr. 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 Improved with BiPAP.  HRCT 2/09 &  4/10 did not show any evidence of pulmonary fibrosis . Large hiatal hernia was noted. PFT showed intraparenchymal restriction with a diffuse capacity of 49%.PFTs 8/09 >> marked improvment, FEV1% was 78, FEV68 %, FVC 63%, TLC 67%, DLCO remains 51%.  A sleep study did reveal mild obstructive sleep apnea. On BiPAP +10/5 & is able to sleep supine.  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    Work exposure to chemicals - has been outlined for her school   March 24, 2010  Still working 4 h/day, continues with exercise regimen 5 ds/ wk  Compliant with BiPAP, cannot lie flat for longer duration without it.  Did not desaturate on exertion  Hoarseness resolved - lisinopril stopped, neg ENt evaluation Jearld Fenton) Does have occasional GERD. On previous CT showed large Hiatal hernia.   06/17/2010 ONO shows on bipap/ RA  desaturation for about 6 mins - OK to stay off oxygen during sleep whiel on bipap Sick visit - diagnosed with strep throat ,  Throat swab neg, given  amox . C/o dyspnea & green phlegm CXR - no infiltrate  Denies chest pain, dyspnea, orthopnea, hemoptysis, fever, n/v/d, edema, headache      Review of Systems Pt denies any significant  nasal congestion or excess secretions, fever, chills, sweats, unintended wt loss, pleuritic or exertional cp, orthopnea pnd or leg swelling.  Pt also denies any obvious fluctuation in symptoms with weather or environmental change or other alleviating or aggravating factors.    Pt denies any increase in rescue therapy  over baseline, denies waking up needing it or having early am exacerbations or coughing/wheezing/ or dyspnea       Objective:   Physical Exam    Gen. Pleasant, well-nourished, in no distress ENT - no lesions, no post nasal drip Neck: No JVD, no thyromegaly, no carotid bruits Lungs: no use of accessory muscles, no dullness to percussion, coarse BS with faint rhonchi , no rales Cardiovascular: Rhythm regular, heart sounds  normal, no murmurs or gallops, no peripheral edema Musculoskeletal: No deformities, no cyanosis or clubbing      Assessment & Plan:

## 2010-06-17 NOTE — Patient Instructions (Addendum)
CXR does not show pneumonia You have bronchitis Complete antibiotic course Short course of prednisone as prescribed

## 2010-06-18 NOTE — Assessment & Plan Note (Addendum)
Ct BiPAP for splinting of airways during sleep OK to dc O2 For acute bronchitis, complete course of amoxcillin Short burst of prednisone x 10 ds She will call if no better.

## 2010-06-22 ENCOUNTER — Other Ambulatory Visit: Payer: Self-pay | Admitting: Pulmonary Disease

## 2010-06-28 NOTE — Assessment & Plan Note (Signed)
[facility]                             PULMONARY OFFICE NOTE   REE, ALCALDE                       MRN:          [id number]  DATE:10/31/2006                            DOB:          Mar 01, 1940    Ms. Maraj was seen today in followup.  Her dyspnea is essentially  unchanged.  She is still short of breath with significant orthopnea.  Her blood gasses from her last visit showed a pH of 7.39, PCO2 49, PO2  of 63.  She is now on Bystolic, and off Coreg, and now on Spiriva daily.  Bronchoscopy done by Dr. Vassie Loll shows significant tracheal bronchomalacia.  I will discuss with Dr. Vassie Loll the possibility of bilevel or CPAP HS  support for her chronic respiratory failure.  In the interim, she is to  continue Bystolic and Spiriva.  Prescription for Bystolic was given.  She is to stay off Coreg.     Charlcie Cradle Delford Field, MD, [facility]  Electronically Signed    PEW/MedQ  DD: 10/31/2006  DT: 10/31/2006  Job #: [id number]   cc:   Ernestina Penna, M.D.  Oretha Milch, MD

## 2010-06-28 NOTE — Assessment & Plan Note (Signed)
[facility]                             PULMONARY OFFICE NOTE   NAME:APPLEMyra, Weng                       MRN:          [id number]  DATE:11/26/2006                            DOB:          08-Jan-1941    Ms. Losh is a 70 year old nonsmoker with an extensive workup for  dyspnea which has finally been diagnosed as tracheobronchial malacia.  Her PFT have shown intraparenchymal restriction with a diffuse capacity  of 49% and imaging has revealed mild bibasilar interstitial pulmonary  fibrosis.  A sleep study did reveal mild obstructive sleep apnea.  She  has been able to use her BiPAP  +10/5 with good results.  She is finally  able to rest at night.  Her exercise tolerance during day time is still  limited.  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   CURRENT MEDICATIONS:  1. Simvastatin 80 mg q.h.s.  2. Glyburide/Metformin 2.5/500 one tablet b.i.d.  3. Avapro 300 mg daily.  4. Gabapentin 600 mg 4 times a day.  5. Multivitamin.  6. Aspirin 81 mg daily.  7. Bystolic 5 mg daily.  8. Spiriva inhaler 1 capsule daily.  9. Os-Cal.  10.Tylenol Arthritis.   PHYSICAL EXAMINATION:  VITAL SIGNS:  Weight 185 pounds, blood pressure  104/67, heart rate 78, oxygen saturation 94% on room air.  CV:  S1 S2 normal.  No S3, no pedal edema.  CHEST:  Fine bibasilar crackles.   IMPRESSION:  1. Tracheobronchial malacia.  I have explained the prognosis of this      illness to Ms. Lauderback.  Unfortunately, I do not think her dyspnea      will improve much and she may have periodic exacerbations.  2. Interstitial lung disease.  A followup CT will be obtained in 3      months.  A flu shot was given today.  She has received a pneumonia      vaccine in the past.  3. Sleep apnea.  She was unable to tolerate the CPAP but has done much      better with the BiPAP.   Indication for the BiPAP here is sleep      apnea as well as hypercarbic respiratory failure.  4. Weight gain.  She is interested in losing weight.  I would suggest      changing her diabetes medications to something like      Januvia/Metformin combination which may help with weight      loss. I will defer this to her PMD.  I will go ahead and enroll her      in a pulmonary rehab program which hopefully will also help her to      lose weight.     Oretha Milch, MD  Electronically Signed    RVA/MedQ  DD: 11/26/2006  DT: 11/26/2006  Job #: [id number]   cc:   Alfredia Client, MD  @ [employer] Ms. Madelyn Flavors

## 2010-06-28 NOTE — Consult Note (Signed)
NAME:  Borge, Flor                ACCOUNT NO.:  [id number]   MEDICAL RECORD NO.:  [id number]          PATIENT TYPE:  INP   LOCATION:  5735                         FACILITY:  [facility]   PHYSICIAN:  Oretha Milch, MD      DATE OF BIRTH:  [birth year]   DATE OF CONSULTATION:  08/21/2006  DATE OF DISCHARGE:                                 CONSULTATION   REASON FOR CONSULTATION:  Unexplained dyspnea.   HISTORY OF PRESENT ILLNESS:  Ms. Cobb is a pleasant 70 year old  Caucasian woman who developed sudden onset of dyspnea about 4 days ago.  This was worse on lying flat and, in fact, she spent a couple of nights  sitting up.  She also developed some pedal edema.  She sought attention  in the emergency room when she also developed some left shoulder pain  radiating to her back, and chest x-ray did not show any infiltrates.  She was initially given low-dose prednisone and discharged, but then she  went to urgent care when her shortness of breath was persistent, and  this time she was directly admitted to the hospital.  She has been  treated with steroids for an acute bronchitis; however, she denies  cough, sputum production, or wheezing.  She denies fever or chills.   Her chest x-ray showed some mild atelectasis at the bases and large  hiatal hernia.  The BtNP level on admission was normal.  A spiral CT  chest did not show any evidence of pulmonary emboli, and again showed  some atelectasis at the bases.  Full PFTs were performed.  FEV1/FVC  ratio was normal, thus ruling out airway obstruction.  However, both  FEV1 and FVC were decreased, as was the diffusion capacity suggesting  moderate restriction likely from an intraparenchymal component.   PAST MEDICAL HISTORY:  Includes:  1. Diabetes type 2.  2. Hypertension.  3. Hyperlipidemia.  4. Large hiatal hernia.  5. GERD.   PAST SURGICAL HISTORY:  Includes bilateral tubal ligation.   HOME MEDICATIONS:  1. Prilosec 20 mg daily.  2. Aspirin  81 mg daily.  3. Avapro 300 mg p.o. daily.  4. Gabapentin 600 mg 3 times a day.  5. Flexeril daily.  6. Glyburide/metformin 2.5/500 mg twice a day.  7. Multivitamin daily.  8. Zocor 80 mg daily.  9. Coreg 10 mg daily.  10.Fish oil 1000 mg daily.  11.Calcium daily.   SOCIAL HISTORY:  She works in a Futures trader.  She is married and  lives with her husband.  She has 3 grown children.  She quit smoking in  1988, smoked about a pack and a half a day and started at the age of 7.   FAMILY HISTORY:  Her mother died of congestive heart failure at an  advanced age.  Her siblings are all in good health.   REVIEW OF SYSTEMS:  States that her shoulder pain has subsided after a  pain shot ,her pedal edema has gone down.  Her dyspnea, however, is  still present, and she has to sleep upright.   PHYSICAL EXAM:  GENERAL:  Adult woman ambulating in the room with a long  oxygen cord and not in respiratory distress.  HEART:  Heart rate 68 per minute, respirations 20 per minute, oxygen  saturation 98% on 3 liters nasal cannula, and blood pressure 132/72.  HEENT:  Normal pharyngeal space,no postnasal drip.  NECK:  Supple.  No JVD, no lymphadenopathy.  CVS:  S1, S2 normal.  No rub, no murmur.  ABDOMEN:  Soft, nontender.  NEUROLOGICALLY:  Nonfocal.  EXTREMITIES:  No edema, no calf tenderness.  CHEST:  Clear to auscultation.   LABORATORY DATA:  BtNP level was 48.  Arterial ABG on admission was  7.39/44/76 on ? FiO2.  BUN and creatinine was 32/0.8, hemoglobin 13, WBC  count 13.  Cardiac enzymes have been negative.   IMPRESSION:  1. Unexplained dyspnea:  Pulmonary function tests suggest moderate      restriction with an intraparenchymal component.  However, the BtNP      level was normal, and the CT scan did not show any evidence of      pulmonary fibrosis.  She does have a large hiatal hernia, but I      feel that this is unlikely to explain the degree of restriction and      her sudden onset  dyspnea and mild hypoxia.  I think we will need to      look for other etiologies, such as, pulmonary hypertension and      intracardiac right-to-left shunt (less likely).  There is a small      likelihood that she still has pulmonary fibrosis, which was missed      under spiral CT scan, and we would need a high-resolution CT scan      to further evaluate this.  2. Diabetes type 2.  3. Gastroesophageal reflux disease and hiatal hernia.  4. Ex-smoker.   RECOMMEND:  1. I would suggest that we obtain an echo with Doppler to evaluate the      right heart pressures.  If that is normal, we can do a bubble study      at the same time to evaluate for an intracardiac shunt.  2. I would also like to obtain room air oximetry at rest and on      ambulation.   Thank you for this interesting consult.  We will assist with management  during her hospital stay and after.      Oretha Milch, MD  Electronically Signed    RVA/MEDQ  D:  08/21/2006  T:  08/21/2006  Job:  [id number]   cc:   Day Irving Burton

## 2010-06-28 NOTE — Assessment & Plan Note (Signed)
[facility]                             PULMONARY OFFICE NOTE   [name], April                       MRN:          [id number]  DATE:09/25/2006                            DOB:          September 27, 1940    April [name] is a 70 year old nonsmoker with bibasilar pulmonary fibrosis  with moderate intraparenchymal restriction on PFTs with a total lung  capacity of 55% and diffusion capacity of 49%. Surprisingly, her main  symptom is orthopnea. She reports that she is fine when walking around,  but worse when she has to lie down to sleep at night. She denies cough  or sputum production. She was recently hospitalized for sudden onset  dyspnea and hypoxemia which seemed to have improved.   Cardiac evaluation has been negative. She is scheduled for a treadmill  stress test, but does not appear to be cardiac cause for her symptoms.  Echo did not reveal any evidence of pulmonary hypertension either. She  used her son's CPAP machine and was able to sleep a little bit with  that.   PHYSICAL EXAMINATION:  Weight 180 pounds, blood pressure 118/80, heart  rate 96, oxygen saturation is 93% on room air.  CARDIOVASCULAR: No S3. No pedal edema.  CHEST: Bibasilar fine crackles. Clear to auscultation.   IMPRESSION/PLAN:  1. I have explained to April [name] that she certainly seems to have      interstitial lung disease with moderate restriction. However, I do      not feel that this is the cause of her orthopnea. This seems to be      her most distressing symptom. I have told her that I can pursue a      lung biopsy ( prefer surgical lung biopsy), but I am not sure      whether this will relieve her symptom of orthopnea. I have      explained to her that it may help [place] find a cause for her pulmonary      fibrosis and help to treat it better. I have explained the      procedures involved.  2. Since her symptoms were somewhat relieved with the CPAP, I feel it      is  reasonable to pursue a sleep study. I do note that she is not      sleepy. Her Epworth sleepiness score is 5/24. She does report      snoring and occasional choking episodes during sleep.   At her request, I will send her to Dr.  Delford [name] for a second opinion  specifically in regards to her interstitial lung disease, the need for a  biopsy and whether this would contribute to her symptoms of orthopnea.     Oretha Milch, MD  Electronically Signed    RVA/MedQ  DD: 09/25/2006  DT: 09/26/2006  Job #: [id number]   cc:   April Client, MD  April Cradle. Delford Field, MD, FCCP

## 2010-06-28 NOTE — Letter (Signed)
November 02, 2006    Letter of medical necessity.   RE:  April, [name]  MRN:  [id number]  /  DOB:  1940/09/28   To Whom It May Concern:   This is to certify that [name] has been diagnosed with hypercarbic  respiratory failure. An arterial blood gas on room air demonstrates a  pCO2 of 49. 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 She has severe  orthopnea and has not been able to lay down flat. An overnight  polysomnogram showed mild obstructive sleep apnea but unfortunately only  61 minutes of sleep was recorded. There were a total of 8 hypopneas. No  supine or REM sleep was noted. However she has shown subjective  improvement on positive airway pressure (PAP) therapy. PAP therapy has  enabled her to lie down flat and significantly improved her sleep and  dyspnea.   For these reasons I think she would qualify for bilevel therapy. I have  decided to undertake an empiric trial of BIPAP less 10 over 5 for 3  months and continue this if she has good response to therapy.    Sincerely,      Oretha Milch, MD  Electronically Signed    RVA/MedQ  DD: 11/02/2006  DT: 11/02/2006  Job #: [id number]

## 2010-06-28 NOTE — Assessment & Plan Note (Signed)
[facility]                             PULMONARY OFFICE NOTE   NAME:April [name]                       MRN:          [id number]  DATE:10/17/2006                            DOB:          April 08, 1940    This patient is seen for second opinion. This is a 70 year old female  who has had significant dyspnea but mainly orthopnea since July of 2008.  She is short of breath at rest. She cannot sleep flat at all. If she  bends over, she is short of breath as well. She says her air gets cut  off very easily. She has no cough. No mucous production. She denies any  wheezing. She has slight hoarseness and overall heartburn. No real  choking or dysphagia. There is no edema. She has had some weight gain.  She has post-nasal drainage. She cannot sleep at night because of the  inability to lay flat. She has been using her son's CPAP machine, and  she states this helps her breathing. She has no real cough at this time.  No acid indigestion. No abdominal pain. There is no sore throat, nasal  congestion, sneezing, itching, earache, anxiety or depression. She was  seen previously by Dr. Vassie Loll who diagnosed mild pulmonary fibrosis and  also no real significant apnea. A sleep study that was performed did not  show significant respiratory disturbance index but did show desaturation  at night. Dr. Vassie Loll felt that a surgical lung biopsy would be preferable.  The patient was not keen on this, and he sent the patient to me for a  second opinion.   PAST HISTORY:  Medical history of hypertension, diabetes,  hypercholesterolemia. No operative history except for tubal ligation.   MEDICATION ALLERGIES:  None.   SOCIAL HISTORY:  She smoked 1-1/2 packs a day for 25 years, quit smoking  in 1988. She works as a Engineer, petroleum.   FAMILY HISTORY:  Heart disease in a mother.   REVIEW OF SYSTEMS:  Otherwise noncontributory.   CURRENT MEDICATIONS:  1. Simvastatin 80 mg daily.  2.  Glyburide metformin b.i.d.  3. Coreg 10 mg daily.  4. Avapro 300 mg daily.  5. Gabapentin 600 mg 4 times daily.  6. Multivitamins daily.  7. Aspirin 81 mg daily.  8. Os-Cal D 500 mg daily.   PHYSICAL EXAMINATION:  This is an obese female in no distress.  Temperature 97, blood pressure 114/76, pulse 90. Saturation was 95% on  room air.  CHEST:  Showed diminished breath sounds with a few wheezes with forced  exhalation. There were dry rales at the bases.  CARDIAC EXAM:  Showed a regular rate and rhythm without S3. Normal S1  and S2.  ABDOMEN:  Was soft, protuberant.  EXTREMITIES:  Showed no edema, clubbing or venous disease.  SKIN:  Was clear.  NEUROLOGIC EXAM:  Was intact.  HEENT EXAM:  Showed no jugular venous distention. No lymphadenopathy.   LABORATORY DATA:  CT scan of the chest showed bronchiectatic changes and  minimal fibrosis at the basis but no evidence of ground-glass changes.  Pulmonary functions  including nocturnal polysomnography showed a  respiratory disturbance index of AHI of only 3.7. Pulmonary functions  showed desaturation 79% range, FEV1 was 53% predicted, FVC 49%  predicted; no change with bronchodilator. FEF 25/75 was low at 38%  predicted. There was restriction in the reduction in total lung capacity  down to 65% predicted. Diffusing capacity was low at 48% predicted.   IMPRESSION:  On this patient is significant orthopnea and dyspnea that  appears to be multifactorial. This patient clearly has evidence of  obstructive airways disease on pulmonary function studies and may well  benefit from bronchodilator therapy. Also, I am concerned that the Coreg  may be adversely affecting the patient's pulmonary function. She may  also have some degree of pulmonary fibrosis, but I do not believe it is  severe enough to warrant an open lung biopsy. The plan will be to obtain  an arterial blood gas on room air and to discontinue Coreg, begin  Bystolic 5 mg daily, begin  Spiriva daily, pulse prednisone 40 mg daily.  If the blood gas shows significant CO2 retention, we may need to give  consideration for bilevel h.s. for this patient.     April Cradle Delford Field, MD, [facility]  Electronically Signed    PEW/MedQ  DD: 10/17/2006  DT: 10/18/2006  Job #: [id number]   cc:   April Milch, MD

## 2010-06-28 NOTE — Procedures (Signed)
NAME:  April [name], April [name]                ACCOUNT NO.:  [id number]   MEDICAL RECORD NO.:  [id number]          PATIENT TYPE:  OUT   LOCATION:  SLEEP CENTER                 FACILITY:  [facility]   PHYSICIAN:  Oretha Milch, MD      DATE OF BIRTH:  [birth year]   DATE OF STUDY:  09/26/2006                            NOCTURNAL POLYSOMNOGRAM   REFERRING PHYSICIAN:   INDICATION FOR STUDY:  April [name] is a 70 year old woman who weighs 180  pounds, height 5 feet 1 inch with interstitial lung disease and severe  orthopnea which was somewhat alleviated after using her son's CPAP  machine.  Neck size 13 inches.   EPWORTH SLEEPINESS SCORE:  8   This baseline polysomnogram was performed with a sleep  . EEG, ECG, EOG,  EMG and were recorded.  Sleep stages, periodic movement and EEG arousal  during sleep were scored according to criteria from American Academy of  sleep medicine.  Data acquisition, collation have been validated and  clinically correlated.   MEDICATIONS:   SLEEP ARCHITECTURE:  Lights out time was 10:24 p.m.  Lights on was 4:31  a.m.  Total sleep time was 128 minutes and a sleep period time of 280  minutes.  Sleep maintenance efficiency was 46% with awake during sleep  of 153 minutes.  Sleep latency was 77 minutes.  REM sleep was not  recorded.  Sleep stage distribution as a percentage of total sleep time  was 20% stage I, 65% stage II, 15% stage III sleep.  No REM sleep was  recorded.   AROUSAL DATA:  She had 34 total arousals of which 30 were nonspecific  and 4 were associated with TLM.  There were no arousals associated with  respiratory events.   BODY POSITION:  She was elevated on a wedge and slept on her right side  throughout the study.   RESPIRATORY DATA:  There were a total of 8 hypopneas, 0 obstructive  apneas and 0 mixed events, with an AHI of 3.7 per hour.  No supine or  REM sleep was noted.   OXYGEN DATA:  The lowest oxygen saturation was 79% of end-stage II sleep  and 80%  with a respiratory event.  She spent a total of 61.2 minutes  asleep with a saturation less than 90% and 244.9 minutes of total  recording time.  She met lab protocol for oxygen, 1 liter per minute was  applied at 4:50 a.m. with improvement in her O2 saturation.   CARDIAC DATA:  A low heart rate during sleep was 67 beats per minute,  and a high heart rate as 106 beats per minute during non-REMsleep.   PERIODIC LEG MOVEMENT DATA:  There were a total of 8 leg movements of  which four were associated with arousal, leading to a limb movement with  arousal index of 1.9 per hour.   IMPRESSIONS-RECOMMENDATIONS:  1. No evidence for obstructive sleep apnea.  Sleep efficiency was      extremely poor.  Cardiac arrhythmias were not noted.  Few PLM's      were noticed.  2. There was a delay in sleep onset, and REM  sleep was not observed.  3. Episodes of spontaneous oxygen desaturation were noted which      improved after  application of 1 liter of oxygen.   DISCUSSION:  The patient does not seem to have significant sleep apnea  or periodic limb movements.  The desaturations are most likely on the  basis of interstitial lung disease. Her significant orthopnea remains  unexplained.  Since no supine or REM sleep was observed, the possibility  of a false negative study for obstructive sleep apnea remains.   RECOMMENDATIONS:  1. She will qualify for nocturnal oxygen 1 liter per minute.  2. Maintain lean body weight.  3. Management of interstitial lung disease.      Oretha Milch, MD  Electronically Signed     RVA/MEDQ  D:  10/03/2006 14:33:52  T:  10/04/2006 13:48:56  Job:  [id number]   cc:   Alfredia Client, MD  Fax: [phone]

## 2010-06-28 NOTE — H&P (Signed)
NAME:  April [name], April [name]                ACCOUNT NO.:  [id number]   MEDICAL RECORD NO.:  [id number]          PATIENT TYPE:  INP   LOCATION:  5735                         FACILITY:  [facility]   PHYSICIAN:  Lonia Blood, M.D.       DATE OF BIRTH:  20-Feb-1940   DATE OF ADMISSION:  08/18/2006  DATE OF DISCHARGE:                              HISTORY & PHYSICAL   PRIMARY CARE PHYSICIAN:  Dr. Jaymes Graff [facility].   CHIEF COMPLAINT:  Shortness of breath.   HISTORY OF PRESENT ILLNESS:  Ms. Karabin is a 70 year old woman with a  history of diabetes and hypertension who reported 3 days prior to  admission started having some back pain.  Shortly after, she noticed  that she was so short of breath.  She noticed that her dyspnea was worse  when she was lying flat.  She denies any coughing and any chest pain.  The patient continued to remain short of breath and had a visit to the  emergency room where she was prescribed a low-dose prednisone.  She did  not improve and returned today for further workup and evaluation.  The  patient denies any recent exposures to allergens, chemicals, animals.  She did not travel.  She denies any fever or chills.  Nobody else is  sick in the family.  The patient has not changed her medications  recently.  No pets have recently moved into the house.   PAST MEDICAL HISTORY:  1. Diabetes mellitus, type 2.  2. Hypertension.  3. Hyperlipidemia.  4. Bilateral tubal ligation.  5. Hiatal hernia.  6. Gastroesophageal reflux disease.   HOME MEDICATIONS.:  1. Aspirin 81 mg daily.  2. Avapro 300 mg daily.  3. Calcium daily.  4. Flexeril daily.  5. Gabapentin 600 5hree times a day  6. Glyburide/metformin 2.5/500 twice a day.  7. Multivitamin daily.  8. Zocor 80 mg daily.  9. Coreg 10 mg daily.  10.Fish oil 1000 mg daily.  11.Prilosec over-the-counter 20 mg daily.   SOCIAL HISTORY:  The patient is married and lives with her husband.  She  works in a Chartered certified accountant. She has three grown-up children.  She used  to smoke cigarettes but quit in 1988.  She does not drink alcohol.  Does  not abuse any drugs.   FAMILY HISTORY:  The patient's mother passed away at age 61 with  congestive heart failure.  The patient's father died at age 53 in an  accident.  The patient has two sister and three brothers living. All of  them are healthy.   REVIEW OF SYSTEMS:  As per HPI.  Also the patient denies any headaches,  denies vision changes, denies focal weakness and numbness.  Denies any  swallowing difficulties.  She denies any abdominal pain.  Denies any  nausea or vomiting, denies any urinary complaints. Reports some minor  joint pain.  Denies any skin changes.   PHYSICAL EXAMINATION:  VITAL SIGNS:  Upon admission, temperature 97.9,  blood pressure 129/63, pulse 66, respirations 22, saturation 96% on room  air.  GENERAL  APPEARANCE:  A well-developed, well-nourished woman in no acute  major impending doom.  She is alert, oriented to place, person and time.  Head normocephalic, atraumatic. Eyes:  Pupils equal, round reactive to  light and accommodation.  Extraocular movements intact.  Throat clear.  NECK:  Supple.  No JVD.  CHEST:  The patient has good inspiratory effort.  The expiratory phase  is prolonged.  There is no wheezing that I can appreciate.  There are  crackles in both upper fields and lower fields.  HEART: Regular rate and rhythm without murmurs, rubs or gallop.  ABDOMEN:  Soft, nontender, nondistended.  Bowel sounds are present.  EXTREMITIES:  Lower Extremities have no edema.  SKIN:  Warm, dry without any suspicious rashes.  NEUROLOGIC:  Cranial nerves II-XII are intact. Sensation intact.  Deep  tendon reflexes symmetric.   LABORATORY VALUES ON ADMISSION:  White blood cell count 13,000,  hemoglobin 13, platelet count 261.  Sodium 138, potassium 4.8, chloride  106, BUN 32, creatinine 0.8.  BNP 48.  Arterial blood gas measurements   showed pH 7.39, pCO2 44, pO2 76. Myoglobin is 68, CK-MB less than 1,  troponin less than 0.05.   Scan of the chest shows scattered atelectasis and no pulmonary embolus.   ASSESSMENT/PLAN:  1. Worsening dyspnea.  The etiology of this patient's worsening      dyspnea is obscure for now.  I would worry about possibility of an      atypical pneumonia versus possibility of a pulmonary hypertension      problem.  My plan is to admit the patient to the hospital.  I would      place her on continuous pulse oximetry.  Will obtain pulmonary      function tests and transthoracic echocardiogram.  Empiric      antibiotics for community-acquired pneumonia will be started.      Steroids will be started also for air flow obstruction improvement.  2. Hiatal hernia and gastroesophageal reflux disease.  I am going to      increase the patient proton pump inhibitor dose twice a day to try      to control the reflux symptoms as they may be contributing to her      dyspnea.  3. Diabetes mellitus, type 2.  We will check hemoglobin A1c and keep      the patient on this while in hospital.      Lonia Blood, M.D.  Electronically Signed     SL/MEDQ  D:  08/18/2006  T:  08/19/2006  Job:  [id number]   cc:   Dr.Day, Olena Leatherwood [facility]

## 2010-06-28 NOTE — Discharge Summary (Signed)
NAME:  [name] [name]                ACCOUNT NO.:  [id number]   MEDICAL RECORD NO.:  [id number]          PATIENT TYPE:  INP   LOCATION:  5735                         FACILITY:  [facility]   PHYSICIAN:  Lonia Blood, M.D.DATE OF BIRTH:  Jan 29, 1941   DATE OF ADMISSION:  08/18/2006  DATE OF DISCHARGE:  08/24/2006                               DISCHARGE SUMMARY   ADDENDUM   PRIMARY CARE PHYSICIAN:  Dr. Molly Maduro Day with [facility].   DISCHARGE DIAGNOSES:  1. Dyspnea - thought to be secondary to bilateral lower lobe      bronchiectasis and pulmonary fibrosis - outpatient pulmonary      followup to be accomplished.  2. Diabetes mellitus type 2.  3. Dyslipidemia.  4. Hypertension.  5. Large hiatal hernia with gastroesophageal reflux disease.   DISCHARGE MEDICATIONS:  1. Doxicycline 100 mg p.o. daily for 4 days and then stop.  2. Prednisone 10 mg tablets - 3 tablets for 2 days and then 2 tablets      for 3 days and then 1 tablet for 3 days and then stop.  3. Gabapentin 600 mg t.i.d.  4. Glyburide/metformin 2.5/500 b.i.d.  5. Avapro 300 mg daily.  6. Coreg CR 10 mg daily.  7. Simvastatin 80 mg q.h.s.  8. Os-Cal 600 plus D daily.  9. Multivitamin daily.  10.Vitamin B daily.  11.Fish oil daily.  12.Flexeril 10 mg p.r.n.   FOLLOWUP:  1. The patient is advised to followup with Dr. Vassie Loll with [facility] in 2 weeks.  2. The patient is advised to followup with her primary care physician,      Dr. Molly Maduro Day at [facility] for routine medical      recheck in approximately 1 week.   ADDENDUM TO THE PROCEDURES:  High resolution CT scan showing  bronchiectasis in the bibasilar regions with some pulmonary fibrosis.   ADDENDUM TO HOSPITAL COURSE:  The patient's hospital course was extended  from the date of her previously dictated discharge summary of August 22, 2006 to facilitate the accomplishment of a high resolution CT scan. For  details  concerning extensive testing done prior to the  portion of the  hospital stay covered by this discharge summary, please see that  discharge summary dictated by Dr. Brien Few on August 22, 2006 labeled job  #[id number]. Nonetheless, high resolution CT scan was able to be  accomplished on August 23, 2006. It revealed evidence of bibasilar  bronchiectasis with some pulmonary fibrosis. This is felt to be an  adequate explanation for the patient's dyspnea. The patient was found to  be able to ambulate in the hallway with O2 sats  remaining at 93%  without oxygen support. She had improved significantly from the clinical  standpoint. She was therefore felt to be stable for discharge home and  pulmonary service agreed. On August 24, 2006, the patient was cleared for  discharge to the home setting with followup as detailed above.      Lonia Blood, M.D.  Electronically Signed  JTM/MEDQ  D:  08/24/2006  T:  08/24/2006  Job:  [id number]   cc:   Oretha Milch, MD  Alfredia Client, MD

## 2010-06-28 NOTE — Op Note (Signed)
NAME:  April [name], April [name]                ACCOUNT NO.:  [id number]   MEDICAL RECORD NO.:  [id number]          PATIENT TYPE:  AMB   LOCATION:  CARD                         FACILITY[facility]   PHYSICIAN:  Oretha Milch, MD      DATE OF BIRTH:  06/06/1940   DATE OF PROCEDURE:  10/26/2006  DATE OF DISCHARGE:                               OPERATIVE REPORT   PROCEDURE PERFORMED:  Bronchoscopy.   REASON FOR PROCEDURE:  Persistent orthopnea with CT scan suggestive of  interstitial lung disease.   PROCEDURE NOTE:  Written informed consent was obtained from the patient  prior to the procedure.  The risks of the procedure including coughing,  bleeding, small chance of lung puncture in case a biopsy was taken were  discussed.   ANESTHESIA:  Versed 2 mg and fentanyl 50 mcg were used in divided doses  for conscious sedation.   PROCEDURE IN DETAIL:  Bronchoscope was inserted through the right  nostril.  Upper airway appeared normal.  Vocal cords showed normal  appearance and motion.   The posterior wall of the trachea seemed extremely collapsible during  exploration.  The trachea lumen would narrow down to a few millimeters  during exploration (see picture).  This seemed to extend all the way  down to the segmental bronchi.  The lower lobes of both the lungs, the  lower lobar bronchi of both the lungs seemed to collapse during  exploration.  BAL was obtained from the right lower lobe.  The patient  tolerated the procedure well except for transient desaturation.   FINAL IMPRESSION:  Tracheo- bronchomalacia.  This may very well be the  cause of the patient's severe orthopnea.  Further treatment measures  will be discussed with the patient.      Oretha Milch, MD  Electronically Signed     RVA/MEDQ  D:  10/26/2006  T:  10/26/2006  Job:  [id number]

## 2010-06-28 NOTE — Assessment & Plan Note (Signed)
[facility]                             PULMONARY OFFICE NOTE   NAME:APPLESantasia, Rew                       MRN:          [id number]  DATE:09/07/2006                            DOB:          12/31/1940    Ms. Clendenning is a 70 year old nonsmoker who was admitted for dyspnea and  transient hypoxia.  PFTs showed an FEV1/FVC ratio of 78, ruling out  airway obstruction.  FVC was decreased at 49%.  FEV1 was 53%.  Total  lung capacity was 55%.  Diffusion capacity was decreased at 49%  suggesting severe intraparenchymal restriction.  A 2D echo showed normal  left ventricular function.  Did not suggest pulmonary hypertension.  Mobile study was negative.  Interestingly, the spiral CT scan did not  show any evidence of pulmonary emboli, but did not suggest much fibrosis  either.  However, a high-resolution CT scan with tone images confirmed  fibrosis in both lung bases with traction bronchiectasis.  A moderate to  large-sized hiatal hernia was also noted.   She returns today and still reports considerable dyspnea.  Surprisingly,  she has no dyspnea on exertion, but this seems to be mostly on lying  down.  She denies cough, sputum production.  She does not desaturate on  exercise.   PHYSICAL EXAM:  Blood pressure 128/92, heart rate 84, oxygen saturation  97% on room air.  Respirations 18 per minute.  CHEST:  Fine bibasilar crackles.  No rhonchi.  CVS:  S1, S2 normal.  No murmur.   IMPRESSION:  1. Pulmonary fibrosis with moderate restriction.  Her dyspnea seems to      be quite out of proportion to the degree of fibrosis noted on a CT      scan.  2. The presence of persistent orthopnea makes me wonder about cardiac      disease.   RECOMMENDATIONS:  1. We will get cardiology inpur regarding her persistent orthopnea &      the presence of cardiac disease.  2. She may need a cardiopulmonary stress test.  3. I wonder if the hiatal hernia is in some way  contributing to this      sensation of dyspnea when she lays down.  4. If the bowel workup is all negative, we will proceed with a lung      biopsy to further delineate the kind of fibrosis.  I have discussed      that a transbronchial biopsy may not give [place] sufficient yield to      find the etiology of fibrosis and an open lung biopsy may be      indicated here.  This is      discussed with the patient in detail and she evidenced      understanding. She will also need cardiology clearance for the      procedure.     Oretha Milch, MD  Electronically Signed    RVA/MedQ  DD: 09/07/2006  DT: 09/08/2006  Job #: [id number]   cc:   Alfredia Client, MD  Arturo Morton. Riley Kill, MD, [facility]

## 2010-06-28 NOTE — Assessment & Plan Note (Signed)
April [name]                            CARDIOLOGY OFFICE NOTE   April, [name]                       MRN:          [id number]  DATE:09/19/2006                            DOB:          [birth year]    REFERRING PHYSICIAN:  Comer Locket. Vassie Loll, M.D.   PRIMARY CARE PHYSICIAN:  Ernestina Penna, M.D.   REASON FOR CONSULTATION:  Cardiac evaluation of dyspnea.   HISTORY OF PRESENT ILLNESS:  April [name] is a pleasant 70 year old woman  with a history of type 2 diabetes mellitus, hypertension,  hyperlipidemia, large hiatal hernia and gastroesophageal reflux disease.  She was admitted to the hospital in July, presenting with progressive  dyspnea.  She noted these symptoms becoming a problem in June and  progressing since that time.  She has a remote history of tobacco use,  but quit approximately 20 years ago, and no clear diagnosis of chronic  obstructive pulmonary disease.  She underwent a high resolution CT scan  of the chest during her evaluation and was noted to have bibasilar  bronchiectasis as well as pulmonary fibrosis.  She did not have  ambulatory hypoxia and has not been on supplemental oxygen.  She  recently saw Dr. Oretha Milch in late July, and had also undergone  pulmonary function tests, revealing an FEV-I to FVC ratio of 78, with a  decreased FVC of 49% and FEV-I of 53%.  Diffusion capacity was also  decreased at 49%.  I note that the patient underwent a 2-D  echocardiogram during her hospital stay in July, which was read by Dr.  Jaclyn Prime. [place], to show a left ventricular fraction of 50%-55% with  evidence of diastolic dysfunction.  Also noted was systolic bowing of  the mitral anterior leaflet without frank prolapse and with no  significant mitral regurgitation.  The right heart size was described as  being upper normal, although pulmonary artery systolic pressure was  estimated in the normal range.  She is being considered for possible  transbronchial biopsy and an additional pulmonary evaluation.  We have  been asked to assess her cardiac status from the perspective of  ischemia.   April [name] denies having any sense of exertional chest pain or  palpitations.  When describing her symptoms to me, she says that she  really notices her shortness of breath only when she tries to lie back  or has something touch her back, or if she leans forward.  She states  that this cuts off her breath.  When she is seated upright and even  walking upright, she does not notice any marked shortness of breath.   Her electrocardiogram today showed a sinus rhythm at 89 beats per  minute.   ALLERGIES:  No known drug allergies.   CURRENT MEDICATIONS:  1. Simvastatin 80 mg p.o. q.h.s.  2. Glyburide/Metformin 2.5/500 mg p.o. twice daily.  3. Coreg CR 10 mg p.o. daily.  4. Avapro 300 mg p.o. daily.  5. Gabapentin 600 mg p.o. four times daily.  6. Multivitamin daily.  7. Aspirin 81 mg p.o. daily.  8. Tylenol  Arthritis.  9. Os-Cal/vitamin D, 500 mg p.o. daily.   PAST MEDICAL/SURGICAL HISTORY:  As outlined above.  The patient also  reports a tubal ligation in 1979.  No reported history of pneumonia or  occupational exposures.   SOCIAL HISTORY:  The patient is married.  She has three children.  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  She has a remote tobacco use  history but quit in 1988.  Denies any significant alcohol use.  She does  not exercise regularly.   FAMILY HISTORY:  Was reviewed.  The patient's mother died at age 82 with  congestive heart failure.  The patient's father died at age 76, due to  an accidental death.   REVIEW OF SYSTEMS:  As described in the history of present illness.  She  denies any wheezing.  Has occasional cough but not progressive.  No  hemoptysis.  No palpitations.  No significant lower extremity edema.  Appetite has been normal.  No fever or chills.   PHYSICAL EXAMINATION:  VITAL SIGNS:  Weight 150  pounds, blood pressure  109/66, heart rate 86.  GENERAL:  The patient is overweight.  In no acute distress, without  labored  breathing at rest.  HEENT:  Conjunctivae normal.  Pharynx clear.  NECK:  Supple, no elevated jugular venous pressure.  No obvious  Kussmaul's sign.  No loud bruits.  No thyromegaly.  LUNGS:  Coarse breath sounds at the left base.  Decreased breath sounds  at the right base.  No active wheezing.  HEART:  A regular rate and rhythm.  No obvious S3 gallop.  No loud  systolic murmur or pericardial rub.  No pericardial knot.  ABDOMEN:  Obese, nontender.  Normoactive bowel sounds.  EXTREMITIES:  Exhibit trace edema.  No significant pitting edema.  Distal pulses are 2+.  SKIN:  Warm and dry.  MUSCULOSKELETAL:  No kyphosis noted.  NEUROPSYCHIATRIC:  The patient is alert and oriented x3.   IMPRESSION/RECOMMENDATIONS:  1. Dyspnea, as outlined above:  This really seems to be more      positional rather than clearly with exertion, based on the      patient's description today.  She is not reporting any chest pain.      Evaluation to this point seems to indicate a pulmonary etiology,      based on high resolution CT scan findings and the pulmonary      function tests, although not obstructive lung disease.  She does      have risk factors for underlying cardiac disease, although no frank      angina.  I have records of a stress test performed in 2006,      demonstrating no ischemia.  She has had no interval testing since      that time.  I will plan to have her follow up in the office soon      for a resting and exercise echocardiogram.  If these remain      reassuring and do not offer any clear explanation of the patient's      symptoms, then she should be able to proceed with her continued      pulmonary assessment.  One could ultimately consider a      cardiopulmonary stress test as well.  We will plan to inform the      patient of the test results and also forward  these to Dr. Oretha Milch.  If reassuring,  I doubt that we need to proceed with any      additional      cardiac evaluation at this time.  2. Further plans to follow.     Jonelle Sidle, MD  Electronically Signed    SGM/MedQ  DD: 09/19/2006  DT: 09/19/2006  Job #: [id number]   cc:   Ernestina Penna, M.D.  Oretha Milch, MD

## 2010-06-28 NOTE — Discharge Summary (Signed)
NAME:  April [name], April [name]                ACCOUNT NO.:  [id number]   MEDICAL RECORD NO.:  [id number]          PATIENT TYPE:  INP   LOCATION:  5735                         FACILITY:  [facility]   PHYSICIAN:  Isidor Holts, M.D.  DATE OF BIRTH:  [birth year]   DATE OF ADMISSION:  08/18/2006  DATE OF DISCHARGE:                               DISCHARGE SUMMARY   PMD:  Alfredia Client, M.D., [facility]   DISCHARGE DIAGNOSES:  1. Dyspnea, query etiology.  2. 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  3. Type 2 diabetes mellitus.  4. Dyslipidemia.  5. Hypertension.  6. Large hiatal hernia.  7. Gastroesophageal reflux disease.   DISCHARGE MEDICATIONS:  These will be listed in an addendum at their  appropriate time by the discharging MD.   PROCEDURES:  1. Chest x-ray dated August 17, 2006:  This showed a normal heart size,      mediastinal contours, and pulmonary vascularity, minimal left      basilar atelectasis.  Lungs are otherwise clear.  2. Chest CT angiogram, dated August 18, 2006.  This showed no pulmonary      embolism.  There was moderate-to-large sized hiatal hernia.  Also      mild bilateral lower lobe atelectasis.  3. A 2D echocardiogram dated August 21, 2006.  This showed overall left      ventricular systolic function at the lower limits of normal.  EF 50-      55%.  The study was inadequate for evaluation of left ventricular      regional wall motion.  There was an increased relative contribution      of atrial contraction to left ventricular filling.  Aortic valve      thickness was mildly increased.  There was systolic mitral bowing      of the anterior leaflet but without diagnosed evidence of mitral      valve prolapse.  Right ventricular size was at the upper limits of      normal.   CONSULTATIONS:  Dr. Cyril Mourning, pulmonologist.   ADMISSION HISTORY:  As in H&P notes of August 18, 2006, dictated by Dr.  Leticia Penna. However, in brief, this  is a 70 year old female, with known  history of type 2 diabetes mellitus, hypertension, dyslipidemia, hiatal  hernia, GERD, bilateral tubal ligation, who presents with a three day  history of back pain, subsequently followed by marked shortness of  breath, particularly on lying flat.  No associated coughing or chest  pain.  She initially presented to the emergency department, where she  was evaluated, prescribed low-dose prednisone and allowed home. This  however, this did not seem to ameliorate her symptoms; therefore, she  represented and was subsequently admitted for further evaluation,  investigation, and management.   CLINICAL COURSE:  1. Dyspnea:  Etiology is still not clear.  The patient is an ex-      smoker.  She quit smoking far back in 1988 and denies passive      smoking.  She also has no known  history of allergies.  Has had no      recent sick contacts.  No history of recent prolonged travel.  Be      that as it may, she was evaluated initially with chest x-ray, which      was quite unremarkable, and then subsequently by chest CT      angiogram, which demonstrated a large hiatal hernia with no      evidence of pneumonic consolidation or pulmonary embolism.  ABGs on      August 18, 2006 showed a pH of 7.399, pCO2 44.5, pO2 76, bicarbonate      27.4.  She underwent pulmonary function testing on August 20, 2006.      This showed an FVC of 1.41, against a predicted of 2.86, an FEV1 of      1.11, against a predicted of 2.07.  An FEV1/FVC ratio, however, was      78, effectively ruling out obstructive airway disease, although      there was an obvious restrictive defect.  In addition, DLCO was      11.10, against a predicted of 22.91.  Lung volumes were diminished      with an FVC of 1.78, against a predicted of 2.86.  An IC was 0.98      against a predicted of 2.12.  These findings raised suspicions of      restrictive lung disease with consideration of possible pulmonary       fibrosis.  However, patient was managed with bronchodilator      nebulizers and an empiric trial of antibiotics as well as steroids,      which were administered initially parenterally and subsequently      transitioned to oral taper.  Pulmonary consultation was called,      which was kindly provided by Dr. Cyril Mourning, who recommended a 2D      echocardiogram to rule out pulmonary hypertension and also high-      resolution chest CT scan.  For details of 2D echocardiogram, refer      to procedure list above.  this however, did not show conclusive      evidence of pulmonary hypertension.  The patient had a normal      ejection fraction.  Unfortunately, high-resolution CT scan could      not be carried out on August 22, 2006 as anticipated, because of the      patient's inability to lie flat for this study.  It has been      deferred for now, but the hope is to complete this study prior to      discharge.   1. Type 2 diabetes mellitus:  This has been controlled on a      carbohydrate-modified diet as well as sliding-scale insulin      coverage and pre-admission oral hypoglycemics.   1. Hypertension:  This was addressed with ACE inhibitor treatment.   1. Dyslipidemia:  Patient continues on Statin treatment for this.   1. Hiatal hernia:  Imaging studies, in particular CT angiogram,      demonstrated a large hiatal hernia.  It is not likely that this is      contributory to the patient's acute onset of shortness of breath;      however, she has been placed on proton pump inhibitor treatment.      Certainly, she has had no complaints of retrosternal chest      discomfort.   DISPOSITION:  The patient  has improved somewhat on a combination of  bronchodilator nebulizers, steroid treatment, empiric antibiotics, and  oxygen supplementation.  Unfortunately, she does have a tendency to  desaturate on ambulation and at the time of this dictation, gentle  ambulation has been initiated.  It is likely  that the patient's clinical  picture will become a lot clearer following high-resolution chest CT,  whenever this is done.  However, in the interim, she may require short-  term oxygen supplementation and certainly will benefit from pulmonary  followup on discharge.  Details of disposition will be elucidated in an  addendum at the appropriate time, by the discharging MD.      Isidor Holts, M.D.  Electronically Signed     CO/MEDQ  D:  08/22/2006  T:  08/22/2006  Job:  [id number]   cc:   Alfredia Client, MD

## 2010-08-15 ENCOUNTER — Encounter: Payer: Self-pay | Admitting: Family Medicine

## 2010-08-15 DIAGNOSIS — R0600 Dyspnea, unspecified: Secondary | ICD-10-CM | POA: Insufficient documentation

## 2010-08-15 DIAGNOSIS — E785 Hyperlipidemia, unspecified: Secondary | ICD-10-CM | POA: Insufficient documentation

## 2010-08-15 DIAGNOSIS — I10 Essential (primary) hypertension: Secondary | ICD-10-CM | POA: Insufficient documentation

## 2010-08-15 DIAGNOSIS — E1169 Type 2 diabetes mellitus with other specified complication: Secondary | ICD-10-CM | POA: Insufficient documentation

## 2010-08-15 DIAGNOSIS — E119 Type 2 diabetes mellitus without complications: Secondary | ICD-10-CM | POA: Insufficient documentation

## 2010-08-15 DIAGNOSIS — K5790 Diverticulosis of intestine, part unspecified, without perforation or abscess without bleeding: Secondary | ICD-10-CM | POA: Insufficient documentation

## 2010-08-15 DIAGNOSIS — K219 Gastro-esophageal reflux disease without esophagitis: Secondary | ICD-10-CM | POA: Insufficient documentation

## 2010-08-19 ENCOUNTER — Other Ambulatory Visit: Payer: Self-pay | Admitting: Family Medicine

## 2010-08-19 DIAGNOSIS — Z1231 Encounter for screening mammogram for malignant neoplasm of breast: Secondary | ICD-10-CM

## 2010-09-01 ENCOUNTER — Ambulatory Visit: Payer: Medicare Other | Admitting: Pulmonary Disease

## 2010-09-14 ENCOUNTER — Ambulatory Visit
Admission: RE | Admit: 2010-09-14 | Discharge: 2010-09-14 | Disposition: A | Discharge status: Home / Self Care | Payer: Medicare Other | Source: Ambulatory Visit | Attending: Family Medicine | Admitting: Family Medicine

## 2010-09-14 DIAGNOSIS — Z1231 Encounter for screening mammogram for malignant neoplasm of breast: Secondary | ICD-10-CM

## 2010-11-25 LAB — AFB CULTURE WITH SMEAR (NOT AT ARMC)

## 2010-11-25 LAB — BLOOD GAS, ARTERIAL
Acid-Base Excess: 4.2 — ABNORMAL HIGH
Patient temperature: 98.6
TCO2: 26.3

## 2010-11-25 LAB — CULTURE, RESPIRATORY W GRAM STAIN

## 2010-11-25 LAB — FUNGUS CULTURE W SMEAR: Fungal Smear: NONE SEEN

## 2010-11-29 LAB — POCT CARDIAC MARKERS
CKMB, poc: <1 — ABNORMAL LOW
CKMB, poc: <1 — ABNORMAL LOW
Myoglobin, poc: 41.1
Myoglobin, poc: 68.9
Operator id: 291361
Troponin i, poc: <0.05

## 2010-11-29 LAB — POCT I-STAT 3, ART BLOOD GAS (G3+)
Acid-Base Excess: 2
O2 Saturation: 95
Patient temperature: 98.7
TCO2: 29
pH, Arterial: 7.399

## 2010-11-29 LAB — I-STAT 8, (EC8 V) (CONVERTED LAB)
Acid-Base Excess: 2
Acid-Base Excess: 3 — ABNORMAL HIGH
Bicarbonate: 28.3 — ABNORMAL HIGH
Bicarbonate: 29.7 — ABNORMAL HIGH
Chloride: 106
Glucose, Bld: 84
HCT: 44
Hemoglobin: 15
Potassium: 4
Potassium: 4.8
TCO2: 30
pH, Ven: 7.349 — ABNORMAL HIGH

## 2010-11-29 LAB — CBC
HCT: 42
Hemoglobin: 13.6
Hemoglobin: 13.8
MCHC: 32.4
MCHC: 32.7
MCV: 90.5
MCV: 91.4
MCV: 91.8
Platelets: 261
Platelets: 278
RBC: 4.54
RBC: 4.68
RDW: 13.4
WBC: 13 — ABNORMAL HIGH
WBC: 14.3 — ABNORMAL HIGH

## 2010-11-29 LAB — B-NATRIURETIC PEPTIDE (CONVERTED LAB): Pro B Natriuretic peptide (BNP): 45

## 2010-11-29 LAB — CULTURE, BLOOD (ROUTINE X 2)
Culture: NO GROWTH
Culture: NO GROWTH

## 2010-11-29 LAB — MPO/PR-3 (ANCA) ANTIBODIES: Serine Protease 3: <3.5 U/mL (ref <3.5)

## 2010-11-29 LAB — BASIC METABOLIC PANEL
CO2: 34 — ABNORMAL HIGH
Calcium: 9.8
Chloride: 100
GFR calc Af Amer: >60
Sodium: 143

## 2010-11-29 LAB — DIFFERENTIAL
Basophils Absolute: 0
Basophils Relative: 0
Eosinophils Absolute: 0
Eosinophils Absolute: 0.1
Eosinophils Relative: 1
Lymphocytes Relative: 47 — ABNORMAL HIGH
Lymphs Abs: 1.2
Monocytes Absolute: 0.5
Monocytes Relative: 3
Neutro Abs: 11.4 — ABNORMAL HIGH
Neutrophils Relative %: 88 — ABNORMAL HIGH

## 2010-11-29 LAB — LEGIONELLA ANTIGEN, URINE

## 2010-11-29 LAB — CK TOTAL AND CKMB (NOT AT ARMC)
CK, MB: 1.2
Total CK: 53

## 2010-11-29 LAB — POCT I-STAT CREATININE
Creatinine, Ser: 0.8
Operator id: 284251
Operator id: 291361

## 2010-11-29 LAB — TROPONIN I: Troponin I: 0.01

## 2010-11-29 LAB — D-DIMER, QUANTITATIVE: D-Dimer, Quant: <0.22

## 2010-11-29 LAB — STREP PNEUMONIAE URINARY ANTIGEN: Strep Pneumo Urinary Antigen: NEGATIVE

## 2010-11-29 LAB — RAPID URINE DRUG SCREEN, HOSP PERFORMED: Barbiturates: NOT DETECTED

## 2011-08-10 ENCOUNTER — Ambulatory Visit (INDEPENDENT_AMBULATORY_CARE_PROVIDER_SITE_OTHER): Payer: Medicare Other | Admitting: Pulmonary Disease

## 2011-08-10 ENCOUNTER — Encounter: Payer: Self-pay | Admitting: Pulmonary Disease

## 2011-08-10 VITALS — BP 110/64 | HR 74 | Temp 98.2°F | Ht 62.0 in | Wt 164.0 lb

## 2011-08-10 DIAGNOSIS — G4733 Obstructive sleep apnea (adult) (pediatric): Secondary | ICD-10-CM

## 2011-08-10 DIAGNOSIS — Q318 Other congenital malformations of larynx: Secondary | ICD-10-CM

## 2011-08-10 NOTE — Progress Notes (Signed)
  Subjective:    Patient ID: April [name], female    DOB: 05-13-1940, 71 y.o.   MRN: [id number]  HPI Primary Provider: Dr. 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 Improved with BiPAP.  HRCT 2/09 & 4/10 did not show any evidence of pulmonary fibrosis . Large hiatal hernia was noted.  PFT showed intraparenchymal restriction with a diffuse capacity of 49%.PFTs 8/09 >> marked improvment, FEV1% was 78, FEV68 %, FVC 63%, TLC 67%, DLCO remains 51%.  A sleep study did reveal mild obstructive sleep apnea. On BiPAP +10/5 & is able to sleep supine.  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  Work exposure to chemicals - has been outlined for her school   March 24, 2010  -Did not desaturate on exertion  Hoarseness resolved - lisinopril stopped, neg ENt evaluation Jearld Fenton) Does have occasional GERD. On previous CT showed large Hiatal hernia.   06/17/2010  -ONO shows on bipap/ RA desaturation for about 6 mins - OK to stay off oxygen during sleep while on bipap  Sick visit - diagnosed with strep throat , Throat swab neg, given amox . C/o dyspnea & green phlegm  CXR - no infiltrate   08/10/2011 1 yr FU Still working 4 h/day, continues with exercise regimen 5 ds/ wk  Compliant with BiPAP, cannot lie flat for longer duration without it. Continues to use O2 during sleep but not in daytime Pt states overall breathing is doing very well. She denies having a cough or any other complaints today.  Denies chest pain, dyspnea, orthopnea, hemoptysis, fever, n/v/d, edema, headache     Review of Systems Patient denies significant dyspnea,cough, hemoptysis,  chest pain, palpitations, pedal edema, orthopnea, paroxysmal nocturnal dyspnea, lightheadedness, nausea, vomiting, abdominal or  leg pains        Objective:   Physical Exam  Gen. Pleasant, well-nourished, in no distress ENT - no lesions, no post nasal drip Neck: No JVD, no thyromegaly, no carotid bruits Lungs: no use of accessory muscles, no dullness to percussion, clear without rales or rhonchi  Cardiovascular: Rhythm regular, heart sounds  normal, no murmurs or gallops, no peripheral edema Musculoskeletal: No deformities, no cyanosis or clubbing        Assessment & Plan:

## 2011-08-10 NOTE — Patient Instructions (Signed)
We will ask DME to take away daytime oxygen Let me know when you are ready to get rid of night time oxygen & we can test you for it.

## 2011-08-10 NOTE — Assessment & Plan Note (Signed)
Ct bipap for splinting airways

## 2011-08-11 NOTE — Assessment & Plan Note (Signed)
Ct bipap during sleep Dc daytime o2 - ok to stay off o2 during sleep as a trial

## 2011-08-16 ENCOUNTER — Other Ambulatory Visit: Payer: Self-pay | Admitting: Family Medicine

## 2011-08-16 DIAGNOSIS — Z1231 Encounter for screening mammogram for malignant neoplasm of breast: Secondary | ICD-10-CM

## 2011-09-18 ENCOUNTER — Ambulatory Visit
Admission: RE | Admit: 2011-09-18 | Discharge: 2011-09-18 | Disposition: A | Discharge status: Home / Self Care | Payer: Medicare Other | Source: Ambulatory Visit | Attending: Family Medicine | Admitting: Family Medicine

## 2011-09-18 DIAGNOSIS — Z1231 Encounter for screening mammogram for malignant neoplasm of breast: Secondary | ICD-10-CM

## 2012-08-12 ENCOUNTER — Other Ambulatory Visit: Payer: Self-pay | Admitting: Family Medicine

## 2012-08-20 ENCOUNTER — Other Ambulatory Visit: Payer: Self-pay | Admitting: Family Medicine

## 2012-08-22 ENCOUNTER — Encounter: Payer: Self-pay | Admitting: Pulmonary Disease

## 2012-08-22 ENCOUNTER — Ambulatory Visit (INDEPENDENT_AMBULATORY_CARE_PROVIDER_SITE_OTHER): Payer: Self-pay | Admitting: Pulmonary Disease

## 2012-08-22 VITALS — BP 122/76 | HR 80 | Temp 97.0°F | Ht 61.0 in | Wt 171.4 lb

## 2012-08-22 DIAGNOSIS — Q318 Other congenital malformations of larynx: Secondary | ICD-10-CM

## 2012-08-22 DIAGNOSIS — R0609 Other forms of dyspnea: Secondary | ICD-10-CM

## 2012-08-22 DIAGNOSIS — R0600 Dyspnea, unspecified: Secondary | ICD-10-CM

## 2012-08-22 DIAGNOSIS — R0989 Other specified symptoms and signs involving the circulatory and respiratory systems: Secondary | ICD-10-CM

## 2012-08-22 NOTE — Patient Instructions (Addendum)
Breathing test shows mild restriction as before- 75% Oxygen level stayed ok Sniff test for diaphragm

## 2012-08-22 NOTE — Progress Notes (Signed)
  Subjective:    Patient ID: April [name], female    DOB: [birth year], 72 y.o.   MRN: [id number]  HPI  Primary Provider: Dr. 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 Improved with BiPAP.   Data : HRCT 2/09 & 4/10 did not show any evidence of pulmonary fibrosis . Large hiatal hernia was noted.  PFT showed intraparenchymal restriction with a diffuse capacity of 49%.PFTs 8/09 >> marked improvment, FEV1% was 78, FEV68 %, FVC 63%, TLC 67%, DLCO remains 51%.  A sleep study did reveal mild obstructive sleep apnea. On BiPAP +10/5 & is able to sleep supine.  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  Work exposure to chemicals - has been outlined for her school   March 24, 2010 -  Hoarseness resolved - lisinopril stopped, neg ENt evaluation Jearld Fenton) Does have occasional GERD. CT showed large Hiatal hernia.  06/17/2010 -ONO on bipap/ RA desaturation for about 6 mins -   08/22/2012 Annual FU Oxygen was dc'd 2013 & she has done well without it pt reports has been okay as long as she does not over exert herself. Continues to have some DOE. Orthopnea persists -Pt uses BIPAP and O2 everynight. Denies any problems with machine.  Did not desaturate on exertion She wonders if she will ever improve fully RPt spirometry >> FVC 75% , mild restriction - improved slightly  Past Medical History  Diagnosis Date  . Abnormal weight gain   . OSA (obstructive sleep apnea)   . Hyperlipidemia   . GERD (gastroesophageal reflux disease)   . Hypertension   . NIDDM (non-insulin dependent diabetes mellitus)   . Dyspnea   . Diverticulosis      Review of Systems neg for any significant sore throat, dysphagia, itching, sneezing, nasal congestion or excess/ purulent secretions, fever, chills,  sweats, unintended wt loss, pleuritic or exertional cp, hempoptysis, orthopnea pnd or change in chronic leg swelling. Also denies presyncope, palpitations, heartburn, abdominal pain, nausea, vomiting, diarrhea or change in bowel or urinary habits, dysuria,hematuria, rash, arthralgias, visual complaints, headache, numbness weakness or ataxia.     Objective:   Physical Exam  Gen. Pleasant, well-nourished, in no distress ENT - no lesions, no post nasal drip Neck: No JVD, no thyromegaly, no carotid bruits Lungs: no use of accessory muscles, no dullness to percussion, clear without rales or rhonchi  Cardiovascular: Rhythm regular, heart sounds  normal, no murmurs or gallops, no peripheral edema Musculoskeletal: No deformities, no cyanosis or clubbing         Assessment & Plan:

## 2012-08-22 NOTE — Assessment & Plan Note (Addendum)
Breathing test shows mild restriction as before- 75% Oxygen level stayed ok Sniff test for diaphragmatic weakness - Looking for alternative diagnosis, neurologic weakness should have been apparent by now as would be pulmonary fibrosis. Hiatal hernia may be contributing in some way

## 2012-08-26 ENCOUNTER — Ambulatory Visit (HOSPITAL_COMMUNITY)
Admission: RE | Admit: 2012-08-26 | Discharge: 2012-08-26 | Disposition: A | Discharge status: Home / Self Care | Payer: Medicare Other | Source: Ambulatory Visit | Attending: Pulmonary Disease | Admitting: Pulmonary Disease

## 2012-08-26 DIAGNOSIS — R0602 Shortness of breath: Secondary | ICD-10-CM | POA: Insufficient documentation

## 2012-08-26 DIAGNOSIS — Q318 Other congenital malformations of larynx: Secondary | ICD-10-CM

## 2012-08-27 ENCOUNTER — Other Ambulatory Visit: Payer: Self-pay

## 2012-08-27 DIAGNOSIS — Z1231 Encounter for screening mammogram for malignant neoplasm of breast: Secondary | ICD-10-CM

## 2012-09-03 ENCOUNTER — Encounter: Payer: Self-pay | Admitting: Family Medicine

## 2012-09-03 ENCOUNTER — Ambulatory Visit (INDEPENDENT_AMBULATORY_CARE_PROVIDER_SITE_OTHER): Payer: Medicare Other | Admitting: Family Medicine

## 2012-09-03 VITALS — BP 152/94 | HR 82 | Temp 98.5°F | Resp 16 | Ht 61.0 in | Wt 169.0 lb

## 2012-09-03 DIAGNOSIS — Z124 Encounter for screening for malignant neoplasm of cervix: Secondary | ICD-10-CM

## 2012-09-03 DIAGNOSIS — I10 Essential (primary) hypertension: Secondary | ICD-10-CM

## 2012-09-03 DIAGNOSIS — E785 Hyperlipidemia, unspecified: Secondary | ICD-10-CM

## 2012-09-03 DIAGNOSIS — E119 Type 2 diabetes mellitus without complications: Secondary | ICD-10-CM

## 2012-09-03 DIAGNOSIS — Z0000 Encounter for general adult medical examination without abnormal findings: Secondary | ICD-10-CM

## 2012-09-03 LAB — LIPID PANEL
Cholesterol: 176 mg/dL (ref 0–200)
HDL: 53 mg/dL (ref >39)
LDL Cholesterol: 49 mg/dL (ref 0–99)
Triglycerides: 370 mg/dL — ABNORMAL HIGH (ref <150)

## 2012-09-03 LAB — CBC WITH DIFFERENTIAL/PLATELET
Eosinophils Absolute: 0.1 10*3/uL (ref 0.0–0.7)
Eosinophils Relative: 2 % (ref 0–5)
HCT: 44.5 % (ref 36.0–46.0)
Lymphocytes Relative: 44 % (ref 12–46)
Lymphs Abs: 2.7 10*3/uL (ref 0.7–4.0)
MCH: 29.9 pg (ref 26.0–34.0)
MCV: 88.8 fL (ref 78.0–100.0)
Monocytes Absolute: 0.4 10*3/uL (ref 0.1–1.0)
Platelets: 255 10*3/uL (ref 150–400)
RBC: 5.01 MIL/uL (ref 3.87–5.11)
WBC: 6 10*3/uL (ref 4.0–10.5)

## 2012-09-03 LAB — COMPLETE METABOLIC PANEL WITH GFR
ALT: 38 U/L — ABNORMAL HIGH (ref 0–35)
BUN: 16 mg/dL (ref 6–23)
CO2: 31 mEq/L (ref 19–32)
Calcium: 10.2 mg/dL (ref 8.4–10.5)
Chloride: 100 mEq/L (ref 96–112)
Creat: 0.6 mg/dL (ref 0.50–1.10)
GFR, Est African American: >89 mL/min
GFR, Est Non African American: >89 mL/min
Glucose, Bld: 161 mg/dL — ABNORMAL HIGH (ref 70–99)
Total Bilirubin: 0.4 mg/dL (ref 0.3–1.2)

## 2012-09-03 LAB — MICROALBUMIN, URINE: Microalb, Ur: 0.9 mg/dL (ref 0.00–1.89)

## 2012-09-03 LAB — TSH: TSH: 2.551 u[IU]/mL (ref 0.350–4.500)

## 2012-09-03 MED ORDER — AMLODIPINE BESYLATE 5 MG PO TABS: 5.0000 mg | ORAL_TABLET | Freq: Every day | ORAL | Status: DC

## 2012-09-03 NOTE — Progress Notes (Signed)
Subjective:    Patient ID: April [name], female    DOB: 21-Feb-1940, 72 y.o.   MRN: [id number]  HPI Patient is here for complete physical exam. Her last colonoscopy was 2 years ago and was normal. She does have a history of colon polyps and is due again in 3 more years. Her mammogram is not due again until August. She has had a pneumonia vaccine. Her last tetanus shot was in 2006. She is overdue for the shingles vaccine.  Her blood pressure is elevated today. We had to discontinue losartan due to hyperkalemia. She is currently only taking chlorothiazide 12.5 mg by mouth daily. She is not checking her blood pressure at home. She denies any chest pain shortness of breath or dyspnea on exertion.   Past Medical History  Diagnosis Date  . Abnormal weight gain   . OSA (obstructive sleep apnea)   . Hyperlipidemia   . GERD (gastroesophageal reflux disease)   . Hypertension   . NIDDM (non-insulin dependent diabetes mellitus)   . Dyspnea   . Diverticulosis    No past surgical history on file. Current Outpatient Prescriptions on File Prior to Visit  Medication Sig Dispense Refill  . acetaminophen (TYLENOL ARTHRITIS PAIN) 650 MG CR tablet Take 650 mg by mouth every 8 (eight) hours as needed.        Marland Kitchen aspirin 81 MG tablet Take 81 mg by mouth daily.        . Calcium Carbonate-Vitamin D (CALCIUM 600 + D PO) Take 1 tablet by mouth daily.        Marland Kitchen gabapentin (NEURONTIN) 600 MG tablet Take 1 tablet by mouth 4 (four) times daily.      Marland Kitchen glipiZIDE (GLUCOTROL) 5 MG tablet Take 5 mg by mouth daily.      . hydrochlorothiazide (MICROZIDE) 12.5 MG capsule TAKE 1 CAPSULE BY MOUTH DAILY  30 capsule  2  . Multiple Vitamin (MULTIVITAMIN) capsule Take 1 capsule by mouth daily.        . Omega-3 Fatty Acids (FISH OIL) 1000 MG CAPS Take 1 capsule by mouth daily.        . simvastatin (ZOCOR) 40 MG tablet TAKE 1 TABLET BY MOUTH DAILY AT BEDTIME  30 tablet  0   No current facility-administered medications on file prior  to visit.   No Known Allergies History   Social History  . Marital Status: Married    Spouse Name: N/A    Number of Children: 3  . Years of Education: N/A   Occupational History  . Engineer, petroleum    Social History Main Topics  . Smoking status: Former Smoker -- 1.50 packs/day for 20 years    Types: Cigarettes    Quit date: 02/13/1986  . Smokeless tobacco: Never Used  . Alcohol Use: No  . Drug Use: Not on file  . Sexually Active: Not on file   Other Topics Concern  . Not on file   Social History Narrative  . No narrative on file   Family History  Problem Relation Age of Onset  . Asthma Maternal Grandmother   . Heart disease Mother   . Heart disease Maternal Grandmother   . Heart disease Maternal Grandfather   . Clotting disorder Mother   . Diabetes Brother   . Diabetes Paternal Aunt       Review of Systems  All other systems reviewed and are negative.       Objective:   Physical Exam  Vitals reviewed. Constitutional:  She is oriented to person, place, and time. She appears well-developed and well-nourished. No distress.  HENT:  Head: Normocephalic and atraumatic.  Right Ear: External ear normal.  Left Ear: External ear normal.  Nose: Nose normal.  Mouth/Throat: Oropharynx is clear and moist. No oropharyngeal exudate.  Eyes: Conjunctivae and EOM are normal. Pupils are equal, round, and reactive to light. Right eye exhibits no discharge. Left eye exhibits no discharge. No scleral icterus.  Neck: Normal range of motion. Neck supple. No JVD present. No tracheal deviation present. No thyromegaly present.  Cardiovascular: Normal rate, regular rhythm and intact distal pulses.  Exam reveals no gallop and no friction rub.   No murmur heard. Pulmonary/Chest: Effort normal and breath sounds normal. No respiratory distress. She has no wheezes. She has no rales. She exhibits no tenderness.  Abdominal: Soft. Bowel sounds are normal. She exhibits no distension and no  mass. There is no tenderness. There is no rebound and no guarding. Hernia confirmed negative in the right inguinal area and confirmed negative in the left inguinal area.  Genitourinary: No breast swelling, discharge or bleeding.  Musculoskeletal: Normal range of motion. She exhibits no edema and no tenderness.  Lymphadenopathy:    She has no cervical adenopathy.       Right: No inguinal adenopathy present.       Left: No inguinal adenopathy present.  Neurological: She is alert and oriented to person, place, and time. She has normal reflexes. She displays normal reflexes. No cranial nerve deficit. She exhibits normal muscle tone. Coordination normal.  Skin: Skin is warm and dry. No rash noted. She is not diaphoretic. No erythema. No pallor.  Psychiatric: She has a normal mood and affect. Her behavior is normal. Judgment and thought content normal.   she has significant atrophic vaginitis.        Assessment & Plan:  1. HTN (hypertension) Amlodipine 5 mg by mouth daily and recheck in 1 month - amLODipine (NORVASC) 5 MG tablet; Take 1 tablet (5 mg total) by mouth daily.  Dispense: 30 tablet; Refill: 3 - COMPLETE METABOLIC PANEL WITH GFR  2. Routine general medical examination at a health care facility Otherwise physical exam is within normal limits. I gave patient prescription to get the shingles vaccine at her local pharmacy - CBC with Differential - COMPLETE METABOLIC PANEL WITH GFR - TSH  3. Type II or unspecified type diabetes mellitus without mention of complication, not stated as uncontrolled Check hemoglobin A1c. Goal is less than 6.5. - COMPLETE METABOLIC PANEL WITH GFR - Hemoglobin A1c - Microalbumin, urine  4. HLD (hyperlipidemia) Check fasting lipid panel, goal LDL is less than 100 - COMPLETE METABOLIC PANEL WITH GFR - Lipid panel  5. Cervical cancer screening Pap smear was sent for cytology. - PAP, ThinPrep, Imaging, Medicare

## 2012-09-04 LAB — PAP, THIN PREP, IMAGING, MEDICARE

## 2012-09-18 ENCOUNTER — Ambulatory Visit
Admission: RE | Admit: 2012-09-18 | Discharge: 2012-09-18 | Disposition: A | Discharge status: Home / Self Care | Payer: Medicare Other | Source: Ambulatory Visit

## 2012-09-18 DIAGNOSIS — Z1231 Encounter for screening mammogram for malignant neoplasm of breast: Secondary | ICD-10-CM

## 2012-09-21 ENCOUNTER — Other Ambulatory Visit: Payer: Self-pay | Admitting: Family Medicine

## 2012-09-23 ENCOUNTER — Other Ambulatory Visit: Payer: Self-pay | Admitting: Family Medicine

## 2012-09-23 ENCOUNTER — Telehealth: Payer: Self-pay | Admitting: Family Medicine

## 2012-09-23 MED ORDER — GLIPIZIDE 5 MG PO TABS: 5.0000 mg | ORAL_TABLET | Freq: Every day | ORAL | Status: DC

## 2012-09-23 MED ORDER — GLIPIZIDE ER 5 MG PO TB24: 5.0000 mg | ORAL_TABLET | Freq: Every day | ORAL | Status: DC

## 2012-09-23 NOTE — Telephone Encounter (Signed)
Rx Refilled  

## 2012-09-23 NOTE — Telephone Encounter (Signed)
Resent rx for XR.

## 2012-09-24 NOTE — Telephone Encounter (Signed)
Med refilled.

## 2012-11-09 ENCOUNTER — Other Ambulatory Visit: Payer: Self-pay | Admitting: Family Medicine

## 2012-12-11 ENCOUNTER — Ambulatory Visit (INDEPENDENT_AMBULATORY_CARE_PROVIDER_SITE_OTHER): Payer: Medicare Other | Admitting: Family Medicine

## 2012-12-11 DIAGNOSIS — Z23 Encounter for immunization: Secondary | ICD-10-CM

## 2013-01-21 ENCOUNTER — Ambulatory Visit (INDEPENDENT_AMBULATORY_CARE_PROVIDER_SITE_OTHER): Payer: Medicare Other | Admitting: Family Medicine

## 2013-01-21 DIAGNOSIS — Z23 Encounter for immunization: Secondary | ICD-10-CM

## 2013-02-05 ENCOUNTER — Other Ambulatory Visit: Payer: Self-pay | Admitting: Family Medicine

## 2013-02-07 NOTE — Telephone Encounter (Signed)
Medication refilled per protocol. 

## 2013-02-11 ENCOUNTER — Telehealth: Payer: Self-pay | Admitting: Family Medicine

## 2013-02-11 NOTE — Telephone Encounter (Signed)
Has been sick since Christmas Eve.  Cough congestion. Green secretions.  NTBS.  Appt given for tomorrow morning

## 2013-02-12 ENCOUNTER — Ambulatory Visit (INDEPENDENT_AMBULATORY_CARE_PROVIDER_SITE_OTHER): Payer: Medicare Other | Admitting: Physician Assistant

## 2013-02-12 ENCOUNTER — Encounter: Payer: Self-pay | Admitting: Physician Assistant

## 2013-02-12 VITALS — BP 122/80 | HR 92 | Temp 97.1°F | Resp 20 | Wt 167.0 lb

## 2013-02-12 DIAGNOSIS — J988 Other specified respiratory disorders: Secondary | ICD-10-CM

## 2013-02-12 DIAGNOSIS — J22 Unspecified acute lower respiratory infection: Secondary | ICD-10-CM

## 2013-02-12 DIAGNOSIS — A499 Bacterial infection, unspecified: Secondary | ICD-10-CM

## 2013-02-12 MED ORDER — AZITHROMYCIN 250 MG PO TABS: ORAL_TABLET | ORAL | Status: DC

## 2013-02-12 NOTE — Progress Notes (Signed)
Patient ID: DECLAN ADAMSON MRN: [id number], DOB: 1940/03/06, 72 y.o. Date of Encounter: 02/12/2013, 12:35 PM    Chief Complaint:  Chief Complaint  Patient presents with  . cough,congestion, short of breath    x 1 week, green sputum     HPI: 72 y.o. year old white female reports that she's been sick for a little over one week. SHe says that she has all chest congestion with cough. Really has no head and nasal congestion. No mucus from her nose. No significant sore throat. No earache. No fevers or chills.     Home Meds: See attached medication section for any medications that were entered at today's visit. The computer does not put those onto this list.The following list is a list of meds entered prior to today's visit.   Current Outpatient Prescriptions on File Prior to Visit  Medication Sig Dispense Refill  . acetaminophen (TYLENOL ARTHRITIS PAIN) 650 MG CR tablet Take 650 mg by mouth every 8 (eight) hours as needed.        Marland Kitchen amLODipine (NORVASC) 5 MG tablet Take 1 tablet (5 mg total) by mouth daily.  30 tablet  3  . aspirin 81 MG tablet Take 81 mg by mouth daily.        . Calcium Carbonate-Vitamin D (CALCIUM 600 + D PO) Take 1 tablet by mouth daily.        Marland Kitchen gabapentin (NEURONTIN) 600 MG tablet Take 1 tablet by mouth 4 (four) times daily.      Marland Kitchen glipiZIDE (GLUCOTROL XL) 5 MG 24 hr tablet TAKE 1 TABLET BY MOUTH DAILY  30 tablet  5  . hydrochlorothiazide (MICROZIDE) 12.5 MG capsule TAKE 1 CAPSULE BY MOUTH DAILY  30 capsule  2  . Multiple Vitamin (MULTIVITAMIN) capsule Take 1 capsule by mouth daily.        . Omega-3 Fatty Acids (FISH OIL) 1000 MG CAPS Take 1 capsule by mouth daily.        . simvastatin (ZOCOR) 40 MG tablet TAKE 1 TABLET BY MOUTH DAILY AT BEDTIME, NEEDS TO BE SEEN  30 tablet  5   No current facility-administered medications on file prior to visit.    Allergies: No Known Allergies    Review of Systems: See HPI for pertinent ROS. All other ROS negative.     Physical Exam: Blood pressure 122/80, pulse 92, temperature 97.1 F (36.2 C), temperature source Oral, resp. rate 20, weight 167 lb (75.751 kg), SpO2 96.00%., Body mass index is 31.57 kg/(m^2). General: WNWD WF.  Appears in no acute distress. HEENT: Normocephalic, atraumatic, eyes without discharge, sclera non-icteric, nares are without discharge. Bilateral auditory canals clear, TM's are without perforation, pearly grey and translucent with reflective cone of light bilaterally. Oral cavity moist, posterior pharynx without exudate, erythema, peritonsillar abscess.  Neck: Supple. No thyromegaly. No lymphadenopathy. Lungs: Clear bilaterally to auscultation without wheezes, rales, or rhonchi. Breathing is unlabored. Lungs are clear. Heart: Regular rhythm. No murmurs, rubs, or gallops. Msk:  Strength and tone normal for age. Extremities/Skin: Warm and dry. No clubbing or cyanosis. No edema. No rashes or suspicious lesions. Neuro: Alert and oriented X 3. Moves all extremities spontaneously. Gait is normal. CNII-XII grossly in tact. Psych:  Responds to questions appropriately with a normal affect.     ASSESSMENT AND PLAN:  72 y.o. year old female with  1. Bacterial lower respiratory infection - azithromycin (ZITHROMAX) 250 MG tablet; Day 1: Take 2 daily.  Days 2-5: Take 1 daily.  Dispense: 6 tablet; Refill: 0 Recommend Mucinex DM as expectorant. Follow up if symptoms did not resolve after completion of antibiotic.  [address] [place], [place], [facility] 02/12/2013 12:35 PM

## 2013-03-05 ENCOUNTER — Other Ambulatory Visit: Payer: Medicare Other

## 2013-03-10 ENCOUNTER — Ambulatory Visit (INDEPENDENT_AMBULATORY_CARE_PROVIDER_SITE_OTHER): Payer: Medicare Other | Admitting: Family Medicine

## 2013-03-10 ENCOUNTER — Encounter: Payer: Self-pay | Admitting: Family Medicine

## 2013-03-10 VITALS — BP 128/72 | HR 80 | Temp 98.0°F | Resp 16 | Ht 62.0 in | Wt 170.0 lb

## 2013-03-10 DIAGNOSIS — E119 Type 2 diabetes mellitus without complications: Secondary | ICD-10-CM

## 2013-03-10 DIAGNOSIS — I10 Essential (primary) hypertension: Secondary | ICD-10-CM

## 2013-03-10 DIAGNOSIS — E785 Hyperlipidemia, unspecified: Secondary | ICD-10-CM

## 2013-03-10 DIAGNOSIS — D485 Neoplasm of uncertain behavior of skin: Secondary | ICD-10-CM

## 2013-03-10 LAB — COMPLETE METABOLIC PANEL WITH GFR
ALBUMIN: 4.4 g/dL (ref 3.5–5.2)
ALK PHOS: 62 U/L (ref 39–117)
ALT: 74 U/L — AB (ref 0–35)
AST: 60 U/L — ABNORMAL HIGH (ref 0–37)
BUN: 19 mg/dL (ref 6–23)
CO2: 33 meq/L — AB (ref 19–32)
Calcium: 9.9 mg/dL (ref 8.4–10.5)
Chloride: 99 mEq/L (ref 96–112)
Creat: 0.63 mg/dL (ref 0.50–1.10)
GFR, Est African American: >89 mL/min
GLUCOSE: 170 mg/dL — AB (ref 70–99)
POTASSIUM: 4.6 meq/L (ref 3.5–5.3)
SODIUM: 140 meq/L (ref 135–145)
TOTAL PROTEIN: 7.3 g/dL (ref 6.0–8.3)
Total Bilirubin: 0.5 mg/dL (ref 0.3–1.2)

## 2013-03-10 LAB — LIPID PANEL
CHOL/HDL RATIO: 3.4 ratio
Cholesterol: 165 mg/dL (ref 0–200)
HDL: 49 mg/dL (ref >39)
LDL CALC: 66 mg/dL (ref 0–99)
TRIGLYCERIDES: 251 mg/dL — AB (ref <150)
VLDL: 50 mg/dL — AB (ref 0–40)

## 2013-03-10 LAB — HEMOGLOBIN A1C
Hgb A1c MFr Bld: 7.5 % — ABNORMAL HIGH (ref <5.7)
Mean Plasma Glucose: 169 mg/dL — ABNORMAL HIGH (ref <117)

## 2013-03-10 NOTE — Progress Notes (Signed)
Subjective:    Patient ID: April [name], female    DOB: March 27, 1940, 73 y.o.   MRN: [id number]  HPI Patient is here today for a recheck of her diabetes.  She is currently on glipizide xr 5 mg poqday.  She inadvertantly stopped metformin in the past.  Her fasting blood sugar is 130-150.  She is not taking post prandial readings.  She denies hypoglycemia.  She is also concerned about a lesion on her right cheek that has been there for 1 year.  It is 6 mm in size, scaly, and bleeds occasionally.  She denies any chest pain, sob, doe.  She denies any myalgias or ruq pain. Past Medical History  Diagnosis Date  . Abnormal weight gain   . OSA (obstructive sleep apnea)   . Hyperlipidemia   . GERD (gastroesophageal reflux disease)   . Hypertension   . NIDDM (non-insulin dependent diabetes mellitus)   . Dyspnea   . Diverticulosis   \ Current Outpatient Prescriptions on File Prior to Visit  Medication Sig Dispense Refill  . acetaminophen (TYLENOL ARTHRITIS PAIN) 650 MG CR tablet Take 650 mg by mouth every 8 (eight) hours as needed.        Marland Kitchen aspirin 81 MG tablet Take 81 mg by mouth daily.        . Calcium Carbonate-Vitamin D (CALCIUM 600 + D PO) Take 1 tablet by mouth daily.        Marland Kitchen gabapentin (NEURONTIN) 600 MG tablet Take 1 tablet by mouth 4 (four) times daily.      Marland Kitchen glipiZIDE (GLUCOTROL XL) 5 MG 24 hr tablet TAKE 1 TABLET BY MOUTH DAILY  30 tablet  5  . hydrochlorothiazide (MICROZIDE) 12.5 MG capsule TAKE 1 CAPSULE BY MOUTH DAILY  30 capsule  2  . Multiple Vitamin (MULTIVITAMIN) capsule Take 1 capsule by mouth daily.        . Omega-3 Fatty Acids (FISH OIL) 1000 MG CAPS Take 1 capsule by mouth daily.        . simvastatin (ZOCOR) 40 MG tablet TAKE 1 TABLET BY MOUTH DAILY AT BEDTIME, NEEDS TO BE SEEN  30 tablet  5   No current facility-administered medications on file prior to visit.   No Known Allergies History   Social History  . Marital Status: Married    Spouse Name: N/A    Number of  Children: 3  . Years of Education: N/A   Occupational History  . Systems analyst    Social History Main Topics  . Smoking status: Former Smoker -- 1.50 packs/day for 20 years    Types: Cigarettes    Quit date: 02/13/1986  . Smokeless tobacco: Never Used  . Alcohol Use: No  . Drug Use: Not on file  . Sexual Activity: Not on file   Other Topics Concern  . Not on file   Social History Narrative  . No narrative on file      Review of Systems  All other systems reviewed and are negative.       Objective:   Physical Exam  Vitals reviewed. Constitutional: She appears well-developed and well-nourished.  Neck: Neck supple. No JVD present. No thyromegaly present.  Cardiovascular: Normal rate, regular rhythm, normal heart sounds and intact distal pulses.  Exam reveals no gallop and no friction rub.   No murmur heard. Pulmonary/Chest: Effort normal and breath sounds normal. No respiratory distress. She has no wheezes. She has no rales. She exhibits no tenderness.  Abdominal: Soft.  Bowel sounds are normal. She exhibits no distension and no mass. There is no tenderness. There is no rebound and no guarding.  Lymphadenopathy:    She has no cervical adenopathy.  6 mm scaly red papule on right cheek.        Assessment & Plan:  1. Neoplasm of uncertain behavior of skin Using sterile technique, I anesthetized the lesion with lidocaine and then performed a shave biopsy.  The lesion was sent to path in a labeled container.  Hemostasis was achieved with drysol.   - Pathology  2. Type II or unspecified type diabetes mellitus without mention of complication, not stated as uncontrolled Check HgA1c, if greater than 6.5, add metformin. - COMPLETE METABOLIC PANEL WITH GFR - Hemoglobin A1c  3. HLD (hyperlipidemia) Check flp, goal ldl is less than 100. - COMPLETE METABOLIC PANEL WITH GFR - Lipid panel  4. HTN (hypertension) Blood pressure is at goal.  No change in meds at this  time. - COMPLETE METABOLIC PANEL WITH GFR

## 2013-03-12 ENCOUNTER — Other Ambulatory Visit: Payer: Self-pay | Admitting: Family Medicine

## 2013-03-12 DIAGNOSIS — R7989 Other specified abnormal findings of blood chemistry: Secondary | ICD-10-CM

## 2013-03-12 DIAGNOSIS — R945 Abnormal results of liver function studies: Principal | ICD-10-CM

## 2013-03-12 LAB — PATHOLOGY

## 2013-03-19 ENCOUNTER — Other Ambulatory Visit: Payer: Self-pay | Admitting: Family Medicine

## 2013-04-09 ENCOUNTER — Ambulatory Visit (INDEPENDENT_AMBULATORY_CARE_PROVIDER_SITE_OTHER): Payer: Medicare Other | Admitting: Physician Assistant

## 2013-04-09 ENCOUNTER — Encounter: Payer: Self-pay | Admitting: Physician Assistant

## 2013-04-09 VITALS — BP 128/80 | HR 100 | Temp 98.6°F | Resp 18 | Ht 62.0 in | Wt 169.0 lb

## 2013-04-09 DIAGNOSIS — J988 Other specified respiratory disorders: Secondary | ICD-10-CM

## 2013-04-09 DIAGNOSIS — A499 Bacterial infection, unspecified: Secondary | ICD-10-CM

## 2013-04-09 DIAGNOSIS — B9689 Other specified bacterial agents as the cause of diseases classified elsewhere: Principal | ICD-10-CM

## 2013-04-09 MED ORDER — AZITHROMYCIN 250 MG PO TABS: ORAL_TABLET | ORAL | Status: DC

## 2013-04-09 NOTE — Progress Notes (Signed)
Patient ID: April [name] MRN: [id number], DOB: February 20, 1940, 73 y.o. Date of Encounter: 04/09/2013, 2:36 PM    Chief Complaint:  Chief Complaint  Patient presents with  . sick x 2-3 days    very nauseated with HA, cough up green secretions, also nasal drainage     HPI: 73 y.o. year old white female is that she's only been sick for about 3 days.  However she is coughing up very thick dark phlegm from her chest. She actually has some any tissue that she just coughed up and it is extremely dark green and extremely thick. Says she is having some nasal congestion and mucus but that the chest is much worse than the head and nose. Has had no known fever. No sore throat or ear ache.     Home Meds: See attached medication section for any medications that were entered at today's visit. The computer does not put those onto this list.The following list is a list of meds entered prior to today's visit.   Current Outpatient Prescriptions on File Prior to Visit  Medication Sig Dispense Refill  . aspirin 81 MG tablet Take 81 mg by mouth daily.        . Calcium Carbonate-Vitamin D (CALCIUM 600 + D PO) Take 1 tablet by mouth daily.        Marland Kitchen gabapentin (NEURONTIN) 600 MG tablet TAKE 1 TABLET 4 TIMES DAILY  360 tablet  3  . hydrochlorothiazide (MICROZIDE) 12.5 MG capsule TAKE 1 CAPSULE BY MOUTH DAILY  30 capsule  2  . metFORMIN (GLUCOPHAGE) 500 MG tablet TAKE 1 TABLET TWICE DAILY  120 tablet  3  . Multiple Vitamin (MULTIVITAMIN) capsule Take 1 capsule by mouth daily.        . Omega-3 Fatty Acids (FISH OIL) 1000 MG CAPS Take 1 capsule by mouth daily.        Marland Kitchen acetaminophen (TYLENOL ARTHRITIS PAIN) 650 MG CR tablet Take 650 mg by mouth every 8 (eight) hours as needed.        Marland Kitchen glipiZIDE (GLUCOTROL XL) 5 MG 24 hr tablet TAKE 1 TABLET BY MOUTH DAILY  30 tablet  5  . simvastatin (ZOCOR) 40 MG tablet TAKE 1 TABLET BY MOUTH DAILY AT BEDTIME, NEEDS TO BE SEEN  30 tablet  5   No current facility-administered  medications on file prior to visit.    Allergies: No Known Allergies    Review of Systems: See HPI for pertinent ROS. All other ROS negative.    Physical Exam: Blood pressure 128/80, pulse 100, temperature 98.6 F (37 C), temperature source Oral, resp. rate 18, height 5\' 2"  (1.575 m), weight 169 lb (76.658 kg)., Body mass index is 30.9 kg/(m^2). General: WNWD WF.  Appears in no acute distress. HEENT: Normocephalic, atraumatic, eyes without discharge, sclera non-icteric, nares are without discharge. Bilateral auditory canals clear, TM's are without perforation, pearly grey and translucent with reflective cone of light bilaterally. Oral cavity moist, posterior pharynx without exudate, erythema, peritonsillar abscess. No tenderness with percussion of the frontal or maxillary sinuses bilaterally.  Neck: Supple. No thyromegaly. No lymphadenopathy. Lungs: Clear bilaterally to auscultation without wheezes, rales, or rhonchi. Breathing is unlabored. Lungs are clear throughout. I hear no wheezes rhonchi or rales. Heart: Regular rhythm. No murmurs, rubs, or gallops. Msk:  Strength and tone normal for age. Extremities/Skin: Warm and dry. Neuro: Alert and oriented X 3. Moves all extremities spontaneously. Gait is normal. CNII-XII grossly in tact. Psych:  Responds to  questions appropriately with a normal affect.     ASSESSMENT AND PLAN:  73 y.o. year old female with  1. Bacterial respiratory infection - azithromycin (ZITHROMAX) 250 MG tablet; Day 1; Take 2 daily. Days 2-5; Take 1 daily.  Dispense: 6 tablet; Refill: 0 Recommended she also used Mucinex DM as expectorant. Drink lots of fluids to help thin secretions. Followup is symptoms worsen significantly or do not resolve within one week after completion of antibiotic.  Marin Olp Dexter, [place], [facility] 04/09/2013 2:36 PM

## 2013-04-21 ENCOUNTER — Other Ambulatory Visit: Payer: Self-pay | Admitting: Family Medicine

## 2013-04-21 ENCOUNTER — Other Ambulatory Visit: Payer: Medicare Other

## 2013-04-21 DIAGNOSIS — R945 Abnormal results of liver function studies: Principal | ICD-10-CM

## 2013-04-21 DIAGNOSIS — R7989 Other specified abnormal findings of blood chemistry: Secondary | ICD-10-CM

## 2013-04-22 LAB — COMPREHENSIVE METABOLIC PANEL
ALK PHOS: 52 U/L (ref 39–117)
ALT: 86 U/L — AB (ref 0–35)
AST: 74 U/L — AB (ref 0–37)
Albumin: 4 g/dL (ref 3.5–5.2)
BILIRUBIN TOTAL: 0.3 mg/dL (ref 0.2–1.2)
BUN: 22 mg/dL (ref 6–23)
CALCIUM: 9.3 mg/dL (ref 8.4–10.5)
CHLORIDE: 101 meq/L (ref 96–112)
CO2: 28 mEq/L (ref 19–32)
CREATININE: 0.64 mg/dL (ref 0.50–1.10)
Glucose, Bld: 168 mg/dL — ABNORMAL HIGH (ref 70–99)
Potassium: 4.8 mEq/L (ref 3.5–5.3)
Sodium: 140 mEq/L (ref 135–145)
Total Protein: 6.9 g/dL (ref 6.0–8.3)

## 2013-04-23 LAB — HEPATITIS PANEL, ACUTE
HCV Ab: NEGATIVE
HEP A IGM: NONREACTIVE
HEP B C IGM: NONREACTIVE
HEP B S AG: NEGATIVE

## 2013-04-24 ENCOUNTER — Other Ambulatory Visit: Payer: Self-pay | Admitting: Family Medicine

## 2013-04-24 DIAGNOSIS — R945 Abnormal results of liver function studies: Principal | ICD-10-CM

## 2013-04-24 DIAGNOSIS — R7989 Other specified abnormal findings of blood chemistry: Secondary | ICD-10-CM

## 2013-04-28 ENCOUNTER — Encounter: Payer: Self-pay | Admitting: *Deleted

## 2013-04-30 ENCOUNTER — Ambulatory Visit
Admission: RE | Admit: 2013-04-30 | Discharge: 2013-04-30 | Disposition: A | Discharge status: Home / Self Care | Payer: Medicare Other | Source: Ambulatory Visit | Attending: Family Medicine | Admitting: Family Medicine

## 2013-04-30 DIAGNOSIS — R945 Abnormal results of liver function studies: Principal | ICD-10-CM

## 2013-04-30 DIAGNOSIS — R7989 Other specified abnormal findings of blood chemistry: Secondary | ICD-10-CM

## 2013-05-05 ENCOUNTER — Other Ambulatory Visit: Payer: Self-pay | Admitting: Family Medicine

## 2013-05-05 NOTE — Telephone Encounter (Signed)
Medication refilled per protocol. 

## 2013-06-13 ENCOUNTER — Inpatient Hospital Stay (HOSPITAL_COMMUNITY)
Admission: EM | Admit: 2013-06-13 | Discharge: 2013-06-17 | DRG: 287 | Disposition: A | Discharge status: Home / Self Care | Payer: Medicare Other | Attending: Cardiology | Admitting: Cardiology

## 2013-06-13 ENCOUNTER — Encounter (HOSPITAL_COMMUNITY): Payer: Self-pay | Admitting: Emergency Medicine

## 2013-06-13 ENCOUNTER — Emergency Department (HOSPITAL_COMMUNITY): Payer: Medicare Other

## 2013-06-13 DIAGNOSIS — I200 Unstable angina: Secondary | ICD-10-CM | POA: Diagnosis present

## 2013-06-13 DIAGNOSIS — J988 Other specified respiratory disorders: Secondary | ICD-10-CM

## 2013-06-13 DIAGNOSIS — Z7982 Long term (current) use of aspirin: Secondary | ICD-10-CM

## 2013-06-13 DIAGNOSIS — Z8249 Family history of ischemic heart disease and other diseases of the circulatory system: Secondary | ICD-10-CM

## 2013-06-13 DIAGNOSIS — E1169 Type 2 diabetes mellitus with other specified complication: Secondary | ICD-10-CM | POA: Diagnosis present

## 2013-06-13 DIAGNOSIS — K219 Gastro-esophageal reflux disease without esophagitis: Secondary | ICD-10-CM | POA: Diagnosis present

## 2013-06-13 DIAGNOSIS — Z6832 Body mass index (BMI) 32.0-32.9, adult: Secondary | ICD-10-CM

## 2013-06-13 DIAGNOSIS — R0609 Other forms of dyspnea: Secondary | ICD-10-CM

## 2013-06-13 DIAGNOSIS — Z87891 Personal history of nicotine dependence: Secondary | ICD-10-CM

## 2013-06-13 DIAGNOSIS — R0600 Dyspnea, unspecified: Secondary | ICD-10-CM

## 2013-06-13 DIAGNOSIS — E119 Type 2 diabetes mellitus without complications: Secondary | ICD-10-CM | POA: Diagnosis present

## 2013-06-13 DIAGNOSIS — G4733 Obstructive sleep apnea (adult) (pediatric): Secondary | ICD-10-CM | POA: Diagnosis present

## 2013-06-13 DIAGNOSIS — R0989 Other specified symptoms and signs involving the circulatory and respiratory systems: Secondary | ICD-10-CM

## 2013-06-13 DIAGNOSIS — E039 Hypothyroidism, unspecified: Secondary | ICD-10-CM | POA: Diagnosis present

## 2013-06-13 DIAGNOSIS — K7689 Other specified diseases of liver: Secondary | ICD-10-CM | POA: Diagnosis present

## 2013-06-13 DIAGNOSIS — E785 Hyperlipidemia, unspecified: Secondary | ICD-10-CM | POA: Diagnosis present

## 2013-06-13 DIAGNOSIS — J398 Other specified diseases of upper respiratory tract: Secondary | ICD-10-CM | POA: Diagnosis present

## 2013-06-13 DIAGNOSIS — I10 Essential (primary) hypertension: Secondary | ICD-10-CM | POA: Diagnosis present

## 2013-06-13 DIAGNOSIS — I2510 Atherosclerotic heart disease of native coronary artery without angina pectoris: Secondary | ICD-10-CM

## 2013-06-13 HISTORY — DX: Chronic obstructive pulmonary disease, unspecified: J44.9

## 2013-06-13 HISTORY — DX: Unspecified osteoarthritis, unspecified site: M19.90

## 2013-06-13 HISTORY — DX: Personal history of other medical treatment: Z92.89

## 2013-06-13 HISTORY — DX: Other specified diseases of upper respiratory tract: J39.8

## 2013-06-13 HISTORY — DX: Atherosclerotic heart disease of native coronary artery without angina pectoris: I25.10

## 2013-06-13 HISTORY — PX: CARDIAC CATHETERIZATION: SHX172

## 2013-06-13 HISTORY — DX: Dependence on supplemental oxygen: Z99.81

## 2013-06-13 LAB — PROTIME-INR
INR: 1.05 (ref 0.00–1.49)
PROTHROMBIN TIME: 13.5 s (ref 11.6–15.2)

## 2013-06-13 LAB — COMPREHENSIVE METABOLIC PANEL
ALBUMIN: 4 g/dL (ref 3.5–5.2)
ALK PHOS: 58 U/L (ref 39–117)
ALT: 103 U/L — ABNORMAL HIGH (ref 0–35)
AST: 73 U/L — ABNORMAL HIGH (ref 0–37)
BUN: 18 mg/dL (ref 6–23)
CO2: 27 mEq/L (ref 19–32)
Calcium: 9.7 mg/dL (ref 8.4–10.5)
Chloride: 101 mEq/L (ref 96–112)
Creatinine, Ser: 0.61 mg/dL (ref 0.50–1.10)
GFR calc Af Amer: >90 mL/min (ref >90)
GFR calc non Af Amer: 89 mL/min — ABNORMAL LOW (ref >90)
Glucose, Bld: 133 mg/dL — ABNORMAL HIGH (ref 70–99)
Potassium: 4.8 mEq/L (ref 3.7–5.3)
SODIUM: 142 meq/L (ref 137–147)
TOTAL PROTEIN: 7.5 g/dL (ref 6.0–8.3)

## 2013-06-13 LAB — APTT: APTT: 32 s (ref 24–37)

## 2013-06-13 LAB — CBC
HCT: 43.6 % (ref 36.0–46.0)
Hemoglobin: 14.3 g/dL (ref 12.0–15.0)
MCH: 30.3 pg (ref 26.0–34.0)
MCHC: 32.8 g/dL (ref 30.0–36.0)
MCV: 92.4 fL (ref 78.0–100.0)
Platelets: 236 10*3/uL (ref 150–400)
RBC: 4.72 MIL/uL (ref 3.87–5.11)
RDW: 13.7 % (ref 11.5–15.5)
WBC: 8 10*3/uL (ref 4.0–10.5)

## 2013-06-13 LAB — TROPONIN I: Troponin I: <0.3 ng/mL (ref <0.30)

## 2013-06-13 MED ORDER — NITROGLYCERIN 0.4 MG SL SUBL: 0.4000 mg | SUBLINGUAL_TABLET | SUBLINGUAL | Status: DC | PRN

## 2013-06-13 MED ORDER — OMEGA-3-ACID ETHYL ESTERS 1 G PO CAPS
1.0000 g | ORAL_CAPSULE | Freq: Every day | ORAL | Status: DC
  Administered 2013-06-14 – 2013-06-17 (×4): 1 g via ORAL
  Filled 2013-06-13 (×4): qty 1

## 2013-06-13 MED ORDER — HEPARIN SODIUM (PORCINE) 5000 UNIT/ML IJ SOLN
5000.0000 [IU] | Freq: Three times a day (TID) | INTRAMUSCULAR | Status: DC
  Administered 2013-06-13 – 2013-06-17 (×8): 5000 [IU] via SUBCUTANEOUS
  Filled 2013-06-13 (×14): qty 1

## 2013-06-13 MED ORDER — ASPIRIN 81 MG PO CHEW
324.0000 mg | CHEWABLE_TABLET | Freq: Once | ORAL | Status: AC
  Administered 2013-06-13: 324 mg via ORAL
  Filled 2013-06-13: qty 4

## 2013-06-13 MED ORDER — SODIUM CHLORIDE 0.9 % IJ SOLN: 3.0000 mL | INTRAMUSCULAR | Status: DC | PRN

## 2013-06-13 MED ORDER — INSULIN ASPART 100 UNIT/ML ~~LOC~~ SOLN
0.0000 [IU] | Freq: Three times a day (TID) | SUBCUTANEOUS | Status: DC
  Administered 2013-06-14 – 2013-06-15 (×3): 2 [IU] via SUBCUTANEOUS
  Administered 2013-06-16: 3 [IU] via SUBCUTANEOUS
  Administered 2013-06-17: 2 [IU] via SUBCUTANEOUS

## 2013-06-13 MED ORDER — SODIUM CHLORIDE 0.9 % IJ SOLN
3.0000 mL | Freq: Two times a day (BID) | INTRAMUSCULAR | Status: DC
Start: 2013-06-13 — End: 2013-06-16
  Administered 2013-06-13 – 2013-06-16 (×5): 3 mL via INTRAVENOUS

## 2013-06-13 MED ORDER — METOPROLOL TARTRATE 25 MG PO TABS
25.0000 mg | ORAL_TABLET | Freq: Two times a day (BID) | ORAL | Status: DC
  Administered 2013-06-13 – 2013-06-17 (×8): 25 mg via ORAL
  Filled 2013-06-13 (×9): qty 1

## 2013-06-13 MED ORDER — GABAPENTIN 600 MG PO TABS
600.0000 mg | ORAL_TABLET | Freq: Four times a day (QID) | ORAL | Status: DC
  Administered 2013-06-13 – 2013-06-17 (×14): 600 mg via ORAL
  Filled 2013-06-13 (×17): qty 1

## 2013-06-13 MED ORDER — ASPIRIN EC 81 MG PO TBEC
81.0000 mg | DELAYED_RELEASE_TABLET | Freq: Every day | ORAL | Status: DC
  Administered 2013-06-14 – 2013-06-17 (×3): 81 mg via ORAL
  Filled 2013-06-13 (×4): qty 1

## 2013-06-13 MED ORDER — GLIPIZIDE ER 5 MG PO TB24
5.0000 mg | ORAL_TABLET | Freq: Every day | ORAL | Status: DC
  Administered 2013-06-13 – 2013-06-16 (×4): 5 mg via ORAL
  Filled 2013-06-13 (×5): qty 1

## 2013-06-13 MED ORDER — SODIUM CHLORIDE 0.9 % IV SOLN: 250.0000 mL | INTRAVENOUS | Status: DC | PRN

## 2013-06-13 MED ORDER — ASPIRIN 81 MG PO TABS: 81.0000 mg | ORAL_TABLET | Freq: Every day | ORAL | Status: DC

## 2013-06-13 MED ORDER — HYDROCHLOROTHIAZIDE 12.5 MG PO CAPS
12.5000 mg | ORAL_CAPSULE | Freq: Every day | ORAL | Status: DC
  Administered 2013-06-14 – 2013-06-15 (×2): 12.5 mg via ORAL
  Filled 2013-06-13 (×2): qty 1

## 2013-06-13 NOTE — ED Notes (Signed)
Having chest pain off and on for 3 to 3 weeks. Patient is pain free at this time but when she has the pain it is in her left chest and is non-radiating. Chest pain usually occurs with activity. Pain is relieved by rest.

## 2013-06-13 NOTE — ED Notes (Signed)
Attempted report x1. 

## 2013-06-13 NOTE — ED Provider Notes (Signed)
CSN: [id number]     Arrival date & time 06/13/13  1509 History   First MD Initiated Contact with Patient 06/13/13 1539     Chief Complaint  Patient presents with  . Chest Pain     (Consider location/radiation/quality/duration/timing/severity/associated sxs/prior Treatment) HPI 73 year old female former smoker, hypertension, diabetes presents today with exertional chest pain occurring for the past one to 2 weeks. She states that she now has pain with walking. She describes it as substernal and pressure in nature. It does radiate up to her neck bilaterally. She does not have any associated dyspnea, diaphoresis, lightheadedness, nausea, or vomiting. She states that it resolves when she sits down. She has not taken any medications to treat this. She spoke with her doctor today and they told her to come to the emergency department. She is not currently having pain as she does not have pain when she is at rest. Past Medical History  Diagnosis Date  . Abnormal weight gain   . OSA (obstructive sleep apnea)   . Hyperlipidemia   . GERD (gastroesophageal reflux disease)   . Hypertension   . NIDDM (non-insulin dependent diabetes mellitus)   . Dyspnea   . Diverticulosis    History reviewed. No pertinent past surgical history. Family History  Problem Relation Age of Onset  . Asthma Maternal Grandmother   . Heart disease Mother   . Heart disease Maternal Grandmother   . Heart disease Maternal Grandfather   . Clotting disorder Mother   . Diabetes Brother   . Diabetes Paternal Aunt    History  Substance Use Topics  . Smoking status: Former Smoker -- 1.50 packs/day for 20 years    Types: Cigarettes    Quit date: 02/13/1986  . Smokeless tobacco: Never Used  . Alcohol Use: No   OB History   Grav Para Term Preterm Abortions TAB SAB Ect Mult Living                 Review of Systems  All other systems reviewed and are negative.     Allergies  Review of patient's allergies indicates no  known allergies.  Home Medications   Prior to Admission medications   Medication Sig Start Date End Date Taking? Authorizing Provider  acetaminophen (TYLENOL ARTHRITIS PAIN) 650 MG CR tablet Take 650 mg by mouth every 8 (eight) hours as needed.      Historical Provider, MD  aspirin 81 MG tablet Take 81 mg by mouth daily.      Historical Provider, MD  azithromycin (ZITHROMAX) 250 MG tablet Day 1; Take 2 daily. Days 2-5; Take 1 daily. 04/09/13   Orlena Sheldon, PA-C  Calcium Carbonate-Vitamin D (CALCIUM 600 + D PO) Take 1 tablet by mouth daily.      Historical Provider, MD  gabapentin (NEURONTIN) 600 MG tablet TAKE 1 TABLET 4 TIMES DAILY 03/19/13   Susy Frizzle, MD  glipiZIDE (GLUCOTROL XL) 5 MG 24 hr tablet TAKE 1 TABLET BY MOUTH DAILY 09/23/12   Susy Frizzle, MD  hydrochlorothiazide (MICROZIDE) 12.5 MG capsule TAKE 1 CAPSULE BY MOUTH DAILY 05/05/13   Susy Frizzle, MD  metFORMIN (GLUCOPHAGE) 500 MG tablet TAKE 1 TABLET TWICE DAILY 03/12/13   Susy Frizzle, MD  Multiple Vitamin (MULTIVITAMIN) capsule Take 1 capsule by mouth daily.      Historical Provider, MD  Omega-3 Fatty Acids (FISH OIL) 1000 MG CAPS Take 1 capsule by mouth daily.      Historical Provider, MD  simvastatin (  ZOCOR) 40 MG tablet TAKE 1 TABLET BY MOUTH DAILY AT BEDTIME, NEEDS TO BE SEEN 03/12/13   Susy Frizzle, MD   BP 140/56  Pulse 88  Temp(Src) 97.5 F (36.4 C) (Oral)  Resp 18  Ht 5\' 1"  (1.549 m)  Wt 173 lb (78.472 kg)  BMI 32.70 kg/m2  SpO2 93% Physical Exam  Nursing note and vitals reviewed. Constitutional: She is oriented to person, place, and time. She appears well-developed and well-nourished.  HENT:  Head: Normocephalic and atraumatic.  Right Ear: External ear normal.  Left Ear: External ear normal.  Nose: Nose normal.  Mouth/Throat: Oropharynx is clear and moist.  Eyes: Conjunctivae and EOM are normal. Pupils are equal, round, and reactive to light.  Neck: Normal range of motion. Neck supple.   Cardiovascular: Normal rate, regular rhythm, normal heart sounds and intact distal pulses.   Pulmonary/Chest: Effort normal and breath sounds normal.  Abdominal: Soft.  Musculoskeletal: Normal range of motion.  Neurological: She is alert and oriented to person, place, and time. She has normal reflexes. She displays normal reflexes. No cranial nerve deficit. Coordination normal.  Skin: Skin is warm and dry.  Psychiatric: She has a normal mood and affect. Her behavior is normal. Judgment and thought content normal.    ED Course  Procedures (including critical care time) Labs Review Labs Reviewed  CBC  COMPREHENSIVE METABOLIC PANEL  TROPONIN I    Imaging Review No results found.   EKG Interpretation   Date/Time:  Friday Jun 13 2013 15:15:39 EDT Ventricular Rate:  89 PR Interval:  158 QRS Duration: 72 QT Interval:  370 QTC Calculation: 450 R Axis:   60 Text Interpretation:  Normal sinus rhythm Septal infarct , age  undetermined Abnormal ECG septal q waves new from first prior of August 18 2006 Confirmed by Yostin Malacara MD, Andee Poles [phone]) on 06/13/2013 3:40:56 PM      MDM   Final diagnoses:  Unstable angina   73 year old female with multiple coronary artery disease risk factors who presents today with new onset of exertional chest pain. EKG reveals anterior Q waves new from prior. Aspirin given here in emergency department. Patient is pain-free at rest here.  Discussed with Drs Mare Ferrari and [facility] and Dr. Hartford Poli will observe with Dr. Mare Ferrari will consult.    Shaune Pollack, MD 06/13/13 [id number]

## 2013-06-13 NOTE — ED Notes (Signed)
Pt c/o intermittent L sided chest pain for several weeks.  Pain increases with activity and decreases when she is at rest.  She finally became worried enough to call her Dr's office today and they told her to come here.

## 2013-06-13 NOTE — H&P (Signed)
Patient ID: April [name] MRN: [id number], DOB/AGE: [birth year]   Admit date: 06/13/2013  Primary Physician: Odette Fraction, MD Primary Cardiologist: new to  - seen by D. Catalino Plascencia   Pt. 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  Problem List  Past Medical History  Diagnosis Date  . Abnormal weight gain   . OSA (obstructive sleep apnea)   . Hyperlipidemia   . GERD (gastroesophageal reflux disease)   . Hypertension   . NIDDM (non-insulin dependent diabetes mellitus)   . Diverticulosis   . History of stress test     a. 09/2006 nl Dobutamine Echo  . Tracheobronchomalacia     a. followed by [facility]    History reviewed. No pertinent past surgical history.   Allergies  No Known Allergies  HPI  73 year old female without prior cardiac history. She does have multiple risk factors including hypertension, hyperlipidemia, diabetes mellitus, remote tobacco abuse, and a family history of coronary artery disease though not premature. She has a long history of dyspnea and has been followed in pulmonology clinic. She has a diagnosis of tracheobronchomalacia which has been managed with BiPAP at night with improvement in symptoms. In 2008, in the setting of dyspnea, she underwent a dobutamine echocardiogram which was normal.  She was in her usual state of health until approximately 3 weeks ago when she began to experience progressive exertional substernal chest tightness with radiation to her throat, jaw, and chin. Discomfort has been associated with more dyspnea than usual and resolves within one to 5 minutes of rest. She says that at this point even light activity will bring on her symptoms. Because of progressive symptoms, she realized that she needed to get checked out and called her primary care provider today. She was advised present to the ED for evaluation. Here, she is  pain-free. ECG is nonacute. Initial troponin is normal. We have been asked to evaluate.  She denies palpitations, dyspnea, pnd, orthopnea, n, v, dizziness, syncope, edema, weight gain, or early satiety.   Home Medications  Prior to Admission medications   Medication Sig Start Date End Date Taking? Authorizing Provider  aspirin 81 MG tablet Take 81 mg by mouth daily.     Yes Historical Provider, MD  Calcium Carbonate-Vitamin D (CALCIUM 600 + D PO) Take 1 tablet by mouth daily.     Yes Historical Provider, MD  gabapentin (NEURONTIN) 600 MG tablet Take 600 mg by mouth 4 (four) times daily.   Yes Historical Provider, MD  glipiZIDE (GLUCOTROL XL) 5 MG 24 hr tablet Take 5 mg by mouth at bedtime.   Yes Historical Provider, MD  hydrochlorothiazide (MICROZIDE) 12.5 MG capsule Take 12.5 mg by mouth daily.   Yes Historical Provider, MD  metFORMIN (GLUCOPHAGE) 500 MG tablet Take 1,000 mg by mouth 2 (two) times daily with a meal.   Yes Historical Provider, MD  Multiple Vitamin (MULTIVITAMIN) capsule Take 1 capsule by mouth daily.     Yes Historical Provider, MD  Omega-3 Fatty Acids (FISH OIL) 1000 MG CAPS Take 1 capsule by mouth daily.     Yes Historical Provider, MD    Family History  Family History  Problem Relation Age of Onset  . Asthma Maternal Grandmother   . Heart disease Mother   . Heart disease Maternal Grandmother   . Heart disease Maternal Grandfather   . Clotting disorder Mother   .  Diabetes Brother   . Diabetes Paternal Aunt     Social History  History   Social History  . Marital Status: Married    Spouse Name: N/A    Number of Children: 3  . Years of Education: N/A   Occupational History  . Systems analyst    Social History Main Topics  . Smoking status: Former Smoker -- 1.50 packs/day for 20 years    Types: Cigarettes    Quit date: 02/13/1986  . Smokeless tobacco: Never Used  . Alcohol Use: No  . Drug Use: No  . Sexual Activity: Not on file   Other Topics Concern   . Not on file   Social History Narrative  . No narrative on file    Review of Systems General:  No chills, fever, night sweats or weight changes.  Cardiovascular: +++chest pain, dyspnea on exertion, no edema, orthopnea, palpitations, paroxysmal nocturnal dyspnea. Dermatological: No rash, lesions/masses Respiratory: No cough, +++ chronic dyspnea Urologic: No hematuria, dysuria Abdominal:   No nausea, vomiting, diarrhea, bright red blood per rectum, melena, or hematemesis Neurologic:  No visual changes, wkns, changes in mental status. All other systems reviewed and are otherwise negative except as noted above.  Physical Exam  Blood pressure 140/56, pulse 88, temperature 97.5 F (36.4 C), temperature source Oral, resp. rate 18, height 5\' 1"  (1.549 m), weight 173 lb (78.472 kg), SpO2 93.00%.  General: Pleasant, NAD Psych: Normal affect. Neuro: Alert and oriented X 3. Moves all extremities spontaneously. HEENT: Normal  Neck: Supple without bruits or JVD. Lungs:  Resp regular and unlabored, bibasilar crackles that improve with deep breathing. Heart: RRR no s3, s4, or murmurs. Abdomen: Soft, non-tender, non-distended, BS + x 4.  Extremities: No clubbing, cyanosis or edema. DP/PT/Radials 2+ and equal bilaterally.  Labs   Recent Labs  06/13/13 1518  TROPONINI <0.30   Lab Results  Component Value Date   WBC 8.0 06/13/2013   HGB 14.3 06/13/2013   HCT 43.6 06/13/2013   MCV 92.4 06/13/2013   PLT 236 06/13/2013    Recent Labs Lab 06/13/13 1518  NA 142  K 4.8  CL 101  CO2 27  BUN 18  CREATININE 0.61  CALCIUM 9.7  PROT 7.5  BILITOT <0.2*  ALKPHOS 58  ALT 103*  AST 73*  GLUCOSE 133*   Lab Results  Component Value Date   CHOL 165 03/10/2013   HDL 49 03/10/2013   LDLCALC 66 03/10/2013   TRIG 251* 03/10/2013   Radiology/Studies  No results found.  ECG  Rsr, 89, ? Septal infarct, no acute st/t changes.  ASSESSMENT AND PLAN  1.  [place]:  Pt presents with a 3 wk h/o  progressive exertional chest tightness with radiation to the neck/throat/jaw/chin.  Ss highly concerning for angina in women with multiple risk factors including htn, dm, hl, rem tob abuse and FH of CAD.  Will admit, cycle CE.  Cont ASA.  Add bb (BP/HR mildly elev).  Hold off on adding statin for now as LFT's are mildly elevated.  She has not had any Ss @ rest thus will not add heparin unless she rules in.  Hold metformin and plan on cath on Monday.  2.  HTN:  BP up currently.  Follow.  Add BB as above.  3.  Lipids:  LDL 66 in January.  LFT's mildly elevated.  F/u in AM prior to adding statin.  4.  DM:  Hold metformin. Add SSI.  5.  Tracheobronchomalacia:  BiPap @  night.  She will likely need BiPap in place to lie flat for cath on Monday as well.  Signed, Rogelia Mire, NP 06/13/2013, 5:49 PM   I have seen and evaluated the patient this PM along with Rogelia Mire, NP-C. I agree with his findings, examination as well as impression recommendations.  She is a pleasant 73 year old woman with a history of hypertension, hyperlipidemia, diabetes mellitus and distant history of tobacco abuse as well as a family history of CAD. She has noticed about 3 weeks of exertional substernal chest pressure/tightness that radiates to the throat/jaw/chin associated with dyspnea. This has become progressively more severe in intensity as well as occurring with less and less activity.  Simply walking around the emergency room induced her chest tightness symptoms. He So does resolve with rest, but is lasting longer and is more severe.  Her exam is relatively benign, and her EKG is also benign. Her initial set of cardiac enzymes were also negative.  Based on the nature presentation, the most appropriate course of action would be to proceed with cardiac catheterization on Monday. I discussed the procedure with risks, benefits alternatives and indications. She has been both was understanding and agree to  proceed. 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  As she is currently chest pain-free, she can be admitted to the telemetry floor. Provided no symptoms at rest occur, we will hold on heparin unless her troponin levels increase.    Due to her age greater than 90, and more than 3 cardiac risk factors, aspirin use and more than 2 episodes of angina within the last 24 hours, her TIMI risk score for unstable angina is 4 making her risk of adverse events 19.9% which is a high-risk for Acute Coronary Syndrome.  She will need BiPAP at night, now I asked the RT team provide assistance with using BiPAP for R. catheterization since she is likely to have difficulty breathing with her tracheomalacia.  She does a history of NAFLD with mildly elevated LFTs. Most likely the recommendation from the GI would be to try to avoid statins. If her LFTs are within the normal range, I would like to at least to use short-term statins for the acute coronary syndrome timeframe and in the first month or so following her catheterization.     [name], M.D., M.S. Interventional Cardiologist  [place] Pager # [phone] 06/13/2013

## 2013-06-14 LAB — COMPREHENSIVE METABOLIC PANEL
ALBUMIN: 3.7 g/dL (ref 3.5–5.2)
ALK PHOS: 54 U/L (ref 39–117)
ALT: 94 U/L — AB (ref 0–35)
AST: 59 U/L — ABNORMAL HIGH (ref 0–37)
BUN: 19 mg/dL (ref 6–23)
CO2: 27 mEq/L (ref 19–32)
Calcium: 9.2 mg/dL (ref 8.4–10.5)
Chloride: 103 mEq/L (ref 96–112)
Creatinine, Ser: 0.57 mg/dL (ref 0.50–1.10)
GFR calc Af Amer: >90 mL/min (ref >90)
GFR calc non Af Amer: >90 mL/min (ref >90)
Glucose, Bld: 99 mg/dL (ref 70–99)
POTASSIUM: 4.2 meq/L (ref 3.7–5.3)
SODIUM: 146 meq/L (ref 137–147)
Total Bilirubin: 0.3 mg/dL (ref 0.3–1.2)
Total Protein: 7.3 g/dL (ref 6.0–8.3)

## 2013-06-14 LAB — GLUCOSE, CAPILLARY
GLUCOSE-CAPILLARY: 131 mg/dL — AB (ref 70–99)
GLUCOSE-CAPILLARY: 107 mg/dL — AB (ref 70–99)
Glucose-Capillary: 117 mg/dL — ABNORMAL HIGH (ref 70–99)

## 2013-06-14 LAB — TROPONIN I
Troponin I: <0.3 ng/mL (ref <0.30)
Troponin I: <0.3 ng/mL (ref <0.30)
Troponin I: <0.3 ng/mL (ref <0.30)

## 2013-06-14 LAB — TSH: TSH: 6.81 u[IU]/mL — ABNORMAL HIGH (ref 0.350–4.500)

## 2013-06-14 MED ORDER — PANTOPRAZOLE SODIUM 40 MG PO TBEC
40.0000 mg | DELAYED_RELEASE_TABLET | Freq: Every day | ORAL | Status: DC
  Administered 2013-06-14 – 2013-06-17 (×4): 40 mg via ORAL
  Filled 2013-06-14 (×4): qty 1

## 2013-06-14 MED ORDER — PANTOPRAZOLE SODIUM 40 MG IV SOLR: 40.0000 mg | INTRAVENOUS | Status: DC

## 2013-06-14 NOTE — Progress Notes (Signed)
Placed patient on CPAP auto with ranges from 2min to 23max, unable to use BiPAP auto at this time and patient aware and just wants to use something. Said she would like to bring her machine in tomorrow and explained our rules and she will try to bring in home machine tomorrow and mask. Mask is uncomfortable and she doesn't believe she can wear it all night, but she is trying.

## 2013-06-14 NOTE — Progress Notes (Signed)
SUBJECTIVE:  No further CP  OBJECTIVE:   Vitals:   Filed Vitals:   06/13/13 2044 06/13/13 2340 06/14/13 0453 06/14/13 1018  BP: 141/58  128/48 116/68  Pulse: 80 81 71 67  Temp: 98.4 F (36.9 C)  97.5 F (36.4 C)   TempSrc: Oral  Oral   Resp: 18 16 16    Height: 5\' 1"  (1.549 m)     Weight: 168 lb 3.4 oz (76.3 kg)     SpO2: 95% 96% 94%    I&O's:   Intake/Output Summary (Last 24 hours) at 06/14/13 1021 Last data filed at 06/14/13 0934  Gross per 24 hour  Intake    240 ml  Output    500 ml  Net   -260 ml   TELEMETRY: Reviewed telemetry pt in NSR:     PHYSICAL EXAM General: Well developed, well nourished, in no acute distress Head: Eyes PERRLA, No xanthomas.   Normal cephalic and atramatic  Lungs:   Clear bilaterally to auscultation and percussion. Heart:   HRRR S1 S2 Pulses are 2+ & equal. Abdomen: Bowel sounds are positive, abdomen soft and non-tender without masses Extremities:   No clubbing, cyanosis or edema.  DP +1 Neuro: Alert and oriented X 3. Psych:  Good affect, responds appropriately   LABS: Basic Metabolic Panel:  Recent Labs  06/13/13 1518 06/14/13 0511  NA 142 146  K 4.8 4.2  CL 101 103  CO2 27 27  GLUCOSE 133* 99  BUN 18 19  CREATININE 0.61 0.57  CALCIUM 9.7 9.2   Liver Function Tests:  Recent Labs  06/13/13 1518 06/14/13 0511  AST 73* 59*  ALT 103* 94*  ALKPHOS 58 54  BILITOT <0.2* 0.3  PROT 7.5 7.3  ALBUMIN 4.0 3.7   No results found for this basename: LIPASE, AMYLASE,  in the last 72 hours CBC:  Recent Labs  06/13/13 1518  WBC 8.0  HGB 14.3  HCT 43.6  MCV 92.4  PLT 236   Cardiac Enzymes:  Recent Labs  06/13/13 1518 06/14/13 0511  TROPONINI <0.30 <0.30   BNP: No components found with this basename: POCBNP,  D-Dimer: No results found for this basename: DDIMER,  in the last 72 hours Hemoglobin A1C: No results found for this basename: HGBA1C,  in the last 72 hours Fasting Lipid Panel: No results found for this  basename: CHOL, HDL, LDLCALC, TRIG, CHOLHDL, LDLDIRECT,  in the last 72 hours Thyroid Function Tests:  Recent Labs  06/13/13 2318  TSH 6.810*   Anemia Panel: No results found for this basename: VITAMINB12, FOLATE, FERRITIN, TIBC, IRON, RETICCTPCT,  in the last 72 hours Coag Panel:   Lab Results  Component Value Date   INR 1.05 06/13/2013    RADIOLOGY: Dg Chest 2 View  06/13/2013   CLINICAL DATA:  Chest pain, cough and shortness of breath.  EXAM: CHEST  2 VIEW  COMPARISON:  06/17/2010.  FINDINGS: Poor inspiration. Stable normal sized heart and diffuse peribronchial thickening. Small to moderate-sized hiatal hernia. Diffuse osteopenia. Thoracic spine degenerative changes.  IMPRESSION: 1. No acute abnormality. 2. Stable chronic bronchitic changes. 3. Small to moderate-sized hiatal hernia.   Electronically Signed   By: Enrique Sack M.D.   On: 06/13/2013 17:54    ASSESSMENT AND PLAN  1. [place]: Pt presents with a 3 wk h/o progressive exertional chest tightness with radiation to the neck/throat/jaw/chin. Ss highly concerning for angina in women with multiple risk factors including htn, dm, hl, rem tob abuse and FH  of CAD. Will admit, cycle CE. Cont ASA. Add bb (BP/HR mildly elev). Hold off on adding statin for now as LFT's are mildly elevated. She has not had any Ss @ rest thus will not add heparin unless she rules in. Hold metformin and plan on cath on Monday.  2. HTN: controlled. Continue metoprolol 3. Lipids: LDL 66 in January. LFT's mildly elevated. Will hold on statin for now 4. DM: Hold metformin. Add SSI.  5. 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 She will likely need BiPap in place to lie flat for cath on Monday as well.  6.  Mildly elevated TSH c/w hypothyroidism.  Will hold on synthroid until after cath given [place].   April Margarita, MD  06/14/2013  10:21 AM

## 2013-06-15 LAB — GLUCOSE, CAPILLARY
Glucose-Capillary: 114 mg/dL — ABNORMAL HIGH (ref 70–99)
Glucose-Capillary: 192 mg/dL — ABNORMAL HIGH (ref 70–99)
Glucose-Capillary: 133 mg/dL — ABNORMAL HIGH (ref 70–99)
Glucose-Capillary: 82 mg/dL (ref 70–99)
Glucose-Capillary: 132 mg/dL — ABNORMAL HIGH (ref 70–99)
Glucose-Capillary: 206 mg/dL — ABNORMAL HIGH (ref 70–99)

## 2013-06-15 LAB — TROPONIN I
Troponin I: <0.3 ng/mL (ref <0.30)
Troponin I: <0.3 ng/mL (ref <0.30)

## 2013-06-15 MED ORDER — ASPIRIN 81 MG PO CHEW
81.0000 mg | CHEWABLE_TABLET | ORAL | Status: AC
  Administered 2013-06-16: 81 mg via ORAL
  Filled 2013-06-15: qty 1

## 2013-06-15 MED ORDER — SODIUM CHLORIDE 0.9 % IV SOLN
1.0000 mL/kg/h | INTRAVENOUS | Status: DC
Start: 2013-06-16 — End: 2013-06-16
  Administered 2013-06-16: 1 mL/kg/h via INTRAVENOUS

## 2013-06-15 NOTE — Progress Notes (Signed)
Patient sets self on BiPAP QH'S, no issues. Water in chamber for humidify and patient can place self off and on .

## 2013-06-15 NOTE — Progress Notes (Signed)
SUBJECTIVE:  Denies any chest pain or SOB  OBJECTIVE:   Vitals:   Filed Vitals:   06/14/13 1018 06/14/13 1359 06/14/13 2100 06/15/13 0544  BP: 116/68 124/50 122/53 124/41  Pulse: 67 73 72 66  Temp:  97.9 F (36.6 C) 98.4 F (36.9 C) 98.3 F (36.8 C)  TempSrc:  Oral  Oral  Resp:  18 16 16   Height:      Weight:    165 lb 11.2 oz (75.161 kg)  SpO2:  94% 94% 94%   I&O's:   Intake/Output Summary (Last 24 hours) at 06/15/13 1117 Last data filed at 06/15/13 0544  Gross per 24 hour  Intake    300 ml  Output   2200 ml  Net  -1900 ml   TELEMETRY: Reviewed telemetry pt in NSR:     PHYSICAL EXAM General: Well developed, well nourished, in no acute distress Head: Eyes PERRLA, No xanthomas.   Normal cephalic and atramatic  Lungs:   Clear bilaterally to auscultation and percussion. Heart:   HRRR S1 S2 Pulses are 2+ & equal. Abdomen: Bowel sounds are positive, abdomen soft and non-tender without masses  Extremities:   No clubbing, cyanosis or edema.  DP +1 Neuro: Alert and oriented X 3. Psych:  Good affect, responds appropriately   LABS: Basic Metabolic Panel:  Recent Labs  06/13/13 1518 06/14/13 0511  NA 142 146  K 4.8 4.2  CL 101 103  CO2 27 27  GLUCOSE 133* 99  BUN 18 19  CREATININE 0.61 0.57  CALCIUM 9.7 9.2   Liver Function Tests:  Recent Labs  06/13/13 1518 06/14/13 0511  AST 73* 59*  ALT 103* 94*  ALKPHOS 58 54  BILITOT <0.2* 0.3  PROT 7.5 7.3  ALBUMIN 4.0 3.7   No results found for this basename: LIPASE, AMYLASE,  in the last 72 hours CBC:  Recent Labs  06/13/13 1518  WBC 8.0  HGB 14.3  HCT 43.6  MCV 92.4  PLT 236   Cardiac Enzymes:  Recent Labs  06/14/13 1808 06/15/13 0055 06/15/13 0751  TROPONINI <0.30 <0.30 <0.30   BNP: No components found with this basename: POCBNP,  D-Dimer: No results found for this basename: DDIMER,  in the last 72 hours Hemoglobin A1C: No results found for this basename: HGBA1C,  in the last 72  hours Fasting Lipid Panel: No results found for this basename: CHOL, HDL, LDLCALC, TRIG, CHOLHDL, LDLDIRECT,  in the last 72 hours Thyroid Function Tests:  Recent Labs  06/13/13 2318  TSH 6.810*   Anemia Panel: No results found for this basename: VITAMINB12, FOLATE, FERRITIN, TIBC, IRON, RETICCTPCT,  in the last 72 hours Coag Panel:   Lab Results  Component Value Date   INR 1.05 06/13/2013    RADIOLOGY: Dg Chest 2 View  06/13/2013   CLINICAL DATA:  Chest pain, cough and shortness of breath.  EXAM: CHEST  2 VIEW  COMPARISON:  06/17/2010.  FINDINGS: Poor inspiration. Stable normal sized heart and diffuse peribronchial thickening. Small to moderate-sized hiatal hernia. Diffuse osteopenia. Thoracic spine degenerative changes.  IMPRESSION: 1. No acute abnormality. 2. Stable chronic bronchitic changes. 3. Small to moderate-sized hiatal hernia.   Electronically Signed   By: Enrique Sack M.D.   On: 06/13/2013 17:54   ASSESSMENT AND PLAN  1. [place]: Pt presents with a 3 wk h/o progressive exertional chest tightness with radiation to the neck/throat/jaw/chin. Ss highly concerning for angina in women with multiple risk factors including htn, dm, hl, rem  tob abuse and FH of CAD.  Troponins are negative. Cont ASA/beta blocker. Hold off on adding statin for now as LFT's are mildly elevated. She has not had any Ss @ rest thus will not add heparin unless she rules in. Hold metformin and plan on cath on Monday.  2. HTN: controlled. Continue metoprolol.  Will hold HCTZ for cath 3. Lipids: LDL 66 in January. LFT's mildly elevated. Will hold on statin for now  4. DM: Hold metformin. Add SSI.  5. 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 She will likely need BiPap in place to lie flat for cath on Monday as well.  6. Mildly elevated TSH c/w hypothyroidism. Will hold on adding synthroid until after cath given [place].    April Margarita, MD  06/15/2013  11:17 AM

## 2013-06-16 ENCOUNTER — Encounter (HOSPITAL_COMMUNITY)
Admission: EM | Disposition: A | Discharge status: Home / Self Care | Payer: Self-pay | Source: Home / Self Care | Attending: Cardiology

## 2013-06-16 DIAGNOSIS — G4733 Obstructive sleep apnea (adult) (pediatric): Secondary | ICD-10-CM

## 2013-06-16 DIAGNOSIS — I2510 Atherosclerotic heart disease of native coronary artery without angina pectoris: Principal | ICD-10-CM

## 2013-06-16 DIAGNOSIS — J398 Other specified diseases of upper respiratory tract: Secondary | ICD-10-CM | POA: Diagnosis present

## 2013-06-16 DIAGNOSIS — E039 Hypothyroidism, unspecified: Secondary | ICD-10-CM | POA: Diagnosis present

## 2013-06-16 HISTORY — PX: LEFT HEART CATHETERIZATION WITH CORONARY ANGIOGRAM: SHX5451

## 2013-06-16 LAB — GLUCOSE, CAPILLARY
GLUCOSE-CAPILLARY: 154 mg/dL — AB (ref 70–99)
GLUCOSE-CAPILLARY: 93 mg/dL (ref 70–99)
Glucose-Capillary: 103 mg/dL — ABNORMAL HIGH (ref 70–99)
Glucose-Capillary: 105 mg/dL — ABNORMAL HIGH (ref 70–99)
Glucose-Capillary: 181 mg/dL — ABNORMAL HIGH (ref 70–99)

## 2013-06-16 LAB — TROPONIN I

## 2013-06-16 SURGERY — LEFT HEART CATHETERIZATION WITH CORONARY ANGIOGRAM
Anesthesia: LOCAL

## 2013-06-16 MED ORDER — HEPARIN (PORCINE) IN NACL 2-0.9 UNIT/ML-% IJ SOLN
INTRAMUSCULAR | Status: AC
  Filled 2013-06-16: qty 1500

## 2013-06-16 MED ORDER — SODIUM CHLORIDE 0.9 % IV SOLN: 250.0000 mL | INTRAVENOUS | Status: DC | PRN

## 2013-06-16 MED ORDER — VERAPAMIL HCL 2.5 MG/ML IV SOLN
INTRAVENOUS | Status: AC
  Filled 2013-06-16: qty 2

## 2013-06-16 MED ORDER — SODIUM CHLORIDE 0.9 % IJ SOLN: 3.0000 mL | INTRAMUSCULAR | Status: DC | PRN

## 2013-06-16 MED ORDER — ISOSORBIDE MONONITRATE ER 30 MG PO TB24
30.0000 mg | ORAL_TABLET | Freq: Every day | ORAL | Status: DC
Start: 2013-06-16 — End: 2013-06-17
  Administered 2013-06-16 – 2013-06-17 (×2): 30 mg via ORAL
  Filled 2013-06-16 (×2): qty 1

## 2013-06-16 MED ORDER — NITROGLYCERIN 0.2 MG/ML ON CALL CATH LAB
INTRAVENOUS | Status: AC
  Filled 2013-06-16: qty 1

## 2013-06-16 MED ORDER — MIDAZOLAM HCL 2 MG/2ML IJ SOLN
INTRAMUSCULAR | Status: AC
  Filled 2013-06-16: qty 2

## 2013-06-16 MED ORDER — LIDOCAINE HCL (PF) 1 % IJ SOLN
INTRAMUSCULAR | Status: AC
  Filled 2013-06-16: qty 30

## 2013-06-16 MED ORDER — SODIUM CHLORIDE 0.9 % IV SOLN: 1.0000 mL/kg/h | INTRAVENOUS | Status: AC

## 2013-06-16 MED ORDER — OXYCODONE HCL 5 MG PO TABS
5.0000 mg | ORAL_TABLET | Freq: Once | ORAL | Status: AC
  Administered 2013-06-16: 5 mg via ORAL
  Filled 2013-06-16: qty 1

## 2013-06-16 MED ORDER — SODIUM CHLORIDE 0.9 % IJ SOLN
3.0000 mL | Freq: Two times a day (BID) | INTRAMUSCULAR | Status: DC
  Administered 2013-06-17: 3 mL via INTRAVENOUS

## 2013-06-16 NOTE — CV Procedure (Signed)
CARDIAC CATHETERIZATION REPORT  NAME:  April [name]   MRN: [id number] DOB:  Jul 21, 1940   ADMIT DATE: 06/13/2013 Procedure Date: 06/16/2013  INTERVENTIONAL CARDIOLOGIST: [name], M.D., MS PRIMARY CARE PROVIDER: Odette Fraction, MD PRIMARY CARDIOLOGIST: [name], MD  PATIENT:  April [name] is a 73 y.o. female with cardiac risk factors of hypertension, hyperlipidemia, remote tobacco abuse or family history of premature CAD and without three-week history of gradually worsening exertional chest and jaw discomfort concerning for unstable angina with crescendo pattern. She is referred for diagnostic cardiac catheterization based on the high risk for CAD.  PRE-OPERATIVE DIAGNOSIS:    Unstable - Crescendo Angina  PROCEDURES PERFORMED:    Left Heart Catheterization with Coronary Angiography  PROCEDURE:Consent:  Risks of procedure as well as the alternatives and risks of each were explained to the (patient/caregiver).  Consent for procedure obtained. Consent for signed by MD and patient with RN witness -- placed on chart.   PROCEDURE: The patient was brought to the 2nd [facility] in the fasting state and prepped and draped in the usual sterile fashion for Right groin or radial access. A modified Allen's test with plethysmography was performed, revealing excellent Ulnar artery collateral flow.  Sterile technique was used including antiseptics, cap, gloves, gown, hand hygiene, mask and sheet.  Skin prep: Chlorhexidine.  Time Out: Verified patient identification, verified procedure, site/side was marked, verified correct patient position, special equipment/implants available, medications/allergies/relevent history reviewed, required imaging and test results available.  Performed  Access: Right Radial Artery; 6 Fr Sheath -- Seldinger technique (Angiocath Micropuncture Kit)  IA Radial Cocktail - 10 mL, IV Heparin 4000 Units Diagnostic Left Heart  Catheterization with Coronary Angiography:  5 Fr JR 4, JL 3.5, EBU 3.0 Guide catheters advanced and exchanged over [employer] J-wire into the ascending aorta and used for selective coronary artery engagement.  Right  Coronary Artery Angiography: JR 4  Left Coronary Artery Angiography: Unsuccessful a JL3.5, successful with EBU 3.0 guide  LV Hemodynamics (LV Gram): JR 4; due to tortuosity in the brachiocephalic artery and the ascending aorta, I was unable to advance the angled pigtail catheter into the a.c. in the aorta and off to cross the aortic valve.  TR Band:  1830 Hours, 17 mL air  Hemodynamics:  Central Aortic / Mean Pressures: 137/69 mmHg; 99 mmHg  Left Ventricular Pressures / EDP: 136/5 mmHg; 12 mmHg  Left Ventriculography: Not performed due to difficulty crossing with angled pigtail  Coronary Anatomy: Left dominant  Left Main: Large-caliber vessel that trifurcates into the LAD, Circumflex, and Ramus Intermedius. Angiographically normal. LAD: Large-caliber vessel that it is somewhat tortuous course. There is mild diffuse luminal irregularities proximally. There is a stent smaller moderate caliber bifurcating diagonal branch followed by a sharp bend in the LAD with a roughly 40-50% stenosis in the bend. The remainder the vessel takes a somewhat tortuous course around the apex withmother significant lesions.  D1: Small large-caliber vessel that bifurcates into 2 small caliber branches.  Left Circumflex: Large-caliber vessel it takes a very hyperacute angle off of the left main. It tapers to a roughly 70-80% stenosis prior to the bifurcation into a large lateral OM and the follow on AV Groove Circumflex which gives off a Posterior Lateral Branch and the Posterior Descending Artery. The ostia of both branch vessels are involved with roughly 50% in the OM 1 and 60% in AV Groove Circumflex. The branches both take hyperacute almost hairpin type  turns in the cephalad direction  before taking yet again another sharp hairpin turn back call to resume a normal courses for the vessels.  These sharp turns make this lesion very unfavorable for PCI options.  OM1: Large-caliber vessel with a tortuous course as noted above. He ostium is noted to have a roughly 50% stenosis. As the vessel is relatively free of disease, tortuous with a small distal branch. Essentially is in the main lateral OM.  AV Groove Circumflex: Again takes a tortuous course of toward the atrium before turning back down in the AV groove. It gives off a small pair of posterolateral branches with a major arch caliber descending artery. In the distal portion prior to the PDA has a roughly 40% eccentric tubular lesion.  Ramus intermedius: Moderate caliber vessel that courses as a diagonal along the anterolateral wall. Minimal luminal arteries.   RCA: Very small, nondominant vessel with ostial 50% stenosis.    After reviewing the initial angiography, the culprit lesion was thought to be the occasion point of the circumflex into OM1 and AV groove.  After reviewing these images with Dr. Burt Knack, we both agreed to this is a very unfavorable lesion for PCI. Best plan to be for medical therapy as the extreme nature the bands in this vessel make it very difficult to consider safely pass a stent at the bifurcation stenting.Marland Kitchen  MEDICATIONS:  Anesthesia:  Local Lidocaine 2 ml  Sedation:  1 mg IV Versed;   Omnipaque Contrast: 100 ml  Anticoagulation:  IV Heparin 4000 Units  Radial Cocktail: 5 mg Verapamil, 400 mcg NTG, 2 ml 2% Lidocaine in 10 ml NS Intracoronary Nitroglycerin 200 mcg  PATIENT DISPOSITION:    The patient was transferred to the PACU holding area in a hemodynamicaly stable, chest pain free condition.  The patient tolerated the procedure well, and there were no complications.  EBL:   < 10 ml  The patient was stable before, during, and after the procedure.  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 This clearly is a 2 culprit for exertional anginal symptoms, but is not significant enough to cause resting symptoms.  Otherwise mild luminal irregularities in the left dominant system.  Normal the high normal LVEDP. Unable to assess LV [place] to inability to cross the valve with the angled pigtail catheter.  PLAN OF CARE:  The patient was monitored overnight anticipate discharge tomorrow. Would check 2-D echocardiogram to assess EF.  Continue aggressive risk factor modification and medical therapy for her exertional angina.  Have added Imdur to beta blocker. Would consider the possibility of Ranexa plus or minus ARB pending upon blood pressure  She will followup with me post discharge.  If she did have intractable angina, we will need to consider the possibility of either high-risk PCI versus referral for CABG   [name], M.D., M.S. [facility] [address] [place], [place]  [address]  [phone]  06/16/2013 6:42 PM

## 2013-06-16 NOTE — Brief Op Note (Signed)
   BRIEF CARDIAC CATHETERIZATION REPORT  06/13/2013 - 06/16/2013  7:42 PM  PATIENT:  April [name]  73 y.o. female with DM, HTN, HLD & former smoker admitted for progressively worsening Sx of exertional jaw pain c/s crescendo unstable angina. She r/o for MI & has not had any pain since admission - has not exerted herself. She is referred for Cardiac Catheterization.  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  Both branches take a severe "hairpin" turn in opposite directions at the bifurcation taking "upward" turns before a second hairpin turn to return to the normal trajectory.  Very unfavorable for PCI -- HIGH RISK bifurcation lesion   Mild-moderate disease in LAD  Possible mildly reduced LVEF with mildly elevated LVEDP.  PROCEDURE:  Procedure(s): LEFT HEART CATHETERIZATION WITH CORONARY ANGIOGRAM (N/A)  SURGEON:  Surgeon(s) and Role:    * [name], MD - Primary  ANESTHESIA:   local and IV sedation; Lidocaine 2 ml; 1 mg Versed IV  EBL:    < 10 mL  LHC-ANGIO: 6Fr R Radial Artery Sheath - micropuncture kit --> TIG 4.0 Cath for LCA & RCA Angio, Angled Pigtail for LV Gram & hemodynamics   TR Band - 1830 hr, 17 ml  DICTATION: .Note written in EPIC  PLAN OF CARE: Optimize Medical Therapy -- add imdur to BB; Agressive RF modification - Lipid control (? Welchol with inabilty to use statins 2/2 NAFLD); check 2D Echo in AM.  Anticipate d/c tomorrow after Echo & ambulation.  PATIENT DISPOSITION:  PACU - hemodynamically stable.   Delay start of Pharmacological VTE agent (>24hrs) due to surgical blood loss or risk of bleeding: not applicable  [name],  M.D., M.S. Interventional Cardiologist  [place] Pager # [phone] 06/16/2013

## 2013-06-16 NOTE — Progress Notes (Signed)
Patient Profile: 73 year old female without prior cardiac history. She does have multiple risk factors including hypertension, hyperlipidemia, diabetes mellitus, remote tobacco abuse, and a family history of coronary artery disease, though not premature. She presented to [facility] on 5/1 with a 3 wk h/o progressive exertional chest tightness with radiation to the neck/throat/jaw/chin. Ss highly concerning for angina.   Troponins negative x 3. Plan is for diagnostic LHC, +/- PCI on 06/16/13.    Subjective: Denies CP. She felt a little SOB this morning while bathing. No dyspnea at rest.   Objective: Vital signs in last 24 hours: Temp:  [98.3 F (36.8 C)-98.5 F (36.9 C)] 98.5 F (36.9 C) (05/04 0522) Pulse Rate:  [65-73] 66 (05/04 0522) Resp:  [16] 16 (05/04 0522) BP: (97-115)/(47-64) 97/64 mmHg (05/04 0522) SpO2:  [94 %] 94 % (05/04 0522) Weight:  [164 lb 9.6 oz (74.662 kg)] 164 lb 9.6 oz (74.662 kg) (05/04 6144) Last BM Date: 06/15/13  Intake/Output from previous day: 05/03 0701 - 05/04 0700 In: -  Out: 800 [Urine:800] Intake/Output this shift:    Medications Current Facility-Administered Medications  Medication Dose Route Frequency Provider Last Rate Last Dose  . 0.9 %  sodium chloride infusion  250 mL Intravenous PRN Rogelia Mire, NP      . 0.9 %  sodium chloride infusion  1 mL/kg/hr Intravenous Continuous Rogelia Mire, NP 78.5 mL/hr at 06/16/13 0635 1 mL/kg/hr at 06/16/13 0635  . aspirin EC tablet 81 mg  81 mg Oral Daily [name], MD   81 mg at 06/15/13 0911  . gabapentin (NEURONTIN) tablet 600 mg  600 mg Oral QID Rogelia Mire, NP   600 mg at 06/15/13 2217  . glipiZIDE (GLUCOTROL XL) 24 hr tablet 5 mg  5 mg Oral QHS Rogelia Mire, NP   5 mg at 06/15/13 2217  . heparin injection 5,000 Units  5,000 Units Subcutaneous 3 times per day Rogelia Mire, NP   5,000 Units at 06/15/13 2216  . insulin aspart (novoLOG) injection 0-15 Units  0-15 Units  Subcutaneous TID WC Rogelia Mire, NP   2 Units at 06/15/13 1749  . metoprolol tartrate (LOPRESSOR) tablet 25 mg  25 mg Oral BID Rogelia Mire, NP   25 mg at 06/15/13 2217  . nitroGLYCERIN (NITROSTAT) SL tablet 0.4 mg  0.4 mg Sublingual Q5 Min x 3 PRN Rogelia Mire, NP      . omega-3 acid ethyl esters (LOVAZA) capsule 1 g  1 g Oral Daily Rogelia Mire, NP   1 g at 06/15/13 0911  . pantoprazole (PROTONIX) EC tablet 40 mg  40 mg Oral Daily Rocky Crafts Hammons, RPH   40 mg at 06/15/13 0911  . sodium chloride 0.9 % injection 3 mL  3 mL Intravenous Q12H Rogelia Mire, NP   3 mL at 06/15/13 2218  . sodium chloride 0.9 % injection 3 mL  3 mL Intravenous PRN Rogelia Mire, NP        PE: General appearance: alert, cooperative and mild distress Lungs: clear to auscultation bilaterally Heart: regular rate and rhythm, S1, S2 normal, no murmur, click, rub or gallop Extremities: no LEE Pulses: 2+ and symmetric Skin: warm and dry Neurologic: Grossly normal  Lab Results:   Recent Labs  06/13/13 1518  WBC 8.0  HGB 14.3  HCT 43.6  PLT 236   BMET  Recent Labs  06/13/13 1518 06/14/13 0511  NA 142 146  K 4.8  4.2  CL 101 103  CO2 27 27  GLUCOSE 133* 99  BUN 18 19  CREATININE 0.61 0.57  CALCIUM 9.7 9.2   PT/INR  Recent Labs  06/13/13 2318  LABPROT 13.5  INR 1.05   Lipid Panel     Component Value Date/Time   CHOL 165 03/10/2013 1001   TRIG 251* 03/10/2013 1001   HDL 49 03/10/2013 1001   CHOLHDL 3.4 03/10/2013 1001   VLDL 50* 03/10/2013 1001   LDLCALC 66 03/10/2013 1001    Cardiac Panel (last 3 results)  Recent Labs  06/15/13 1200 06/15/13 1835 06/16/13 0050  TROPONINI <0.30 <0.30 <0.30    Assessment/Plan  Active Problems:   HYPERTENSION   Hyperlipidemia   Type 2 diabetes mellitus   Unstable angina   Hypothyroid   Tracheobronchomalacia  1. [place]: Troponins are negative. Cont ASA/beta blocker. Hold off on adding statin for now  as LFT's are mildly elevated. Plan for [facility] +/- PCI today.    2. HTN: controlled. Continue metoprolol. Will hold HCTZ for cath   3. Lipids: LDL 66 in January. LFT's mildly elevated. Will hold on statin for now   4. DM: Hold metformin in anticipation for LHC. Add SSI.   5. 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 She will likely need BiPap in place to lie flat for cath on Monday as well.   6. Mildly elevated TSH c/w hypothyroidism. Will hold on adding synthroid until after cath given [place].     LOS: 3 days    Marinus Eicher M. Ladoris Gene 06/16/2013 7:32 AM

## 2013-06-16 NOTE — H&P (View-Only) (Signed)
The patient was examined and data reviewed. The planned procedure was discussed. Labs are okay. Plan cath and possible PCI later today/. Remain NPO.

## 2013-06-16 NOTE — Progress Notes (Signed)
Medicare initial IM given to pt and signed copy placed in shadow chart 

## 2013-06-16 NOTE — Progress Notes (Signed)
Pt places self on/off our machine with her mask and circuit.  RT refilled water chamber for pt and told her to call if she needs assistance.

## 2013-06-16 NOTE — Progress Notes (Signed)
Attempted to show patient patient education video.  System is down.  Provided handout on cardiac cath.  Pt reviewed and had no additional questions.

## 2013-06-16 NOTE — Progress Notes (Signed)
The patient was examined and data reviewed. The planned procedure was discussed. Labs are okay. Plan cath and possible PCI later today/. Remain NPO. 

## 2013-06-16 NOTE — Interval H&P Note (Signed)
History and Physical Interval Note:  06/16/2013 5:21 PM  April [name]  has presented today for surgery, with the diagnosis of UNSTABLE ANGINA.  The various methods of treatment have been discussed with the patient and family. After consideration of risks, benefits and other options for treatment, the patient has consented to  Procedure(s): LEFT HEART CATHETERIZATION WITH CORONARY ANGIOGRAM (N/A) +/- PCI as a surgical intervention .    The patient's history has been reviewed, patient examined, no change in status, stable for surgery.  I have reviewed the patient's chart and labs.  Questions were answered to the patient's satisfaction.     April [name]  Cath Lab Visit (complete for each Cath Lab visit)  Clinical Evaluation Leading to the Procedure:   ACS: yes  Non-ACS:    Anginal Classification: CCS III  Anti-ischemic medical therapy: Minimal Therapy (1 class of medications)  Non-Invasive Test Results: No non-invasive testing performed  Prior CABG: No previous CABG

## 2013-06-16 NOTE — H&P (View-Only) (Signed)
Patient Profile: 73 year old female without prior cardiac history. She does have multiple risk factors including hypertension, hyperlipidemia, diabetes mellitus, remote tobacco abuse, and a family history of coronary artery disease, though not premature. She presented to [facility] on 5/1 with a 3 wk h/o progressive exertional chest tightness with radiation to the neck/throat/jaw/chin. Ss highly concerning for angina.   Troponins negative x 3. Plan is for diagnostic LHC, +/- PCI on 06/16/13.    Subjective: Denies CP. She felt a little SOB this morning while bathing. No dyspnea at rest.   Objective: Vital signs in last 24 hours: Temp:  [98.3 F (36.8 C)-98.5 F (36.9 C)] 98.5 F (36.9 C) (05/04 0522) Pulse Rate:  [65-73] 66 (05/04 0522) Resp:  [16] 16 (05/04 0522) BP: (97-115)/(47-64) 97/64 mmHg (05/04 0522) SpO2:  [94 %] 94 % (05/04 0522) Weight:  [164 lb 9.6 oz (74.662 kg)] 164 lb 9.6 oz (74.662 kg) (05/04 6144) Last BM Date: 06/15/13  Intake/Output from previous day: 05/03 0701 - 05/04 0700 In: -  Out: 800 [Urine:800] Intake/Output this shift:    Medications Current Facility-Administered Medications  Medication Dose Route Frequency Provider Last Rate Last Dose  . 0.9 %  sodium chloride infusion  250 mL Intravenous PRN Rogelia Mire, NP      . 0.9 %  sodium chloride infusion  1 mL/kg/hr Intravenous Continuous Rogelia Mire, NP 78.5 mL/hr at 06/16/13 0635 1 mL/kg/hr at 06/16/13 0635  . aspirin EC tablet 81 mg  81 mg Oral Daily [name], MD   81 mg at 06/15/13 0911  . gabapentin (NEURONTIN) tablet 600 mg  600 mg Oral QID Rogelia Mire, NP   600 mg at 06/15/13 2217  . glipiZIDE (GLUCOTROL XL) 24 hr tablet 5 mg  5 mg Oral QHS Rogelia Mire, NP   5 mg at 06/15/13 2217  . heparin injection 5,000 Units  5,000 Units Subcutaneous 3 times per day Rogelia Mire, NP   5,000 Units at 06/15/13 2216  . insulin aspart (novoLOG) injection 0-15 Units  0-15 Units  Subcutaneous TID WC Rogelia Mire, NP   2 Units at 06/15/13 1749  . metoprolol tartrate (LOPRESSOR) tablet 25 mg  25 mg Oral BID Rogelia Mire, NP   25 mg at 06/15/13 2217  . nitroGLYCERIN (NITROSTAT) SL tablet 0.4 mg  0.4 mg Sublingual Q5 Min x 3 PRN Rogelia Mire, NP      . omega-3 acid ethyl esters (LOVAZA) capsule 1 g  1 g Oral Daily Rogelia Mire, NP   1 g at 06/15/13 0911  . pantoprazole (PROTONIX) EC tablet 40 mg  40 mg Oral Daily Rocky Crafts Hammons, RPH   40 mg at 06/15/13 0911  . sodium chloride 0.9 % injection 3 mL  3 mL Intravenous Q12H Rogelia Mire, NP   3 mL at 06/15/13 2218  . sodium chloride 0.9 % injection 3 mL  3 mL Intravenous PRN Rogelia Mire, NP        PE: General appearance: alert, cooperative and mild distress Lungs: clear to auscultation bilaterally Heart: regular rate and rhythm, S1, S2 normal, no murmur, click, rub or gallop Extremities: no LEE Pulses: 2+ and symmetric Skin: warm and dry Neurologic: Grossly normal  Lab Results:   Recent Labs  06/13/13 1518  WBC 8.0  HGB 14.3  HCT 43.6  PLT 236   BMET  Recent Labs  06/13/13 1518 06/14/13 0511  NA 142 146  K 4.8  4.2  CL 101 103  CO2 27 27  GLUCOSE 133* 99  BUN 18 19  CREATININE 0.61 0.57  CALCIUM 9.7 9.2   PT/INR  Recent Labs  06/13/13 2318  LABPROT 13.5  INR 1.05   Lipid Panel     Component Value Date/Time   CHOL 165 03/10/2013 1001   TRIG 251* 03/10/2013 1001   HDL 49 03/10/2013 1001   CHOLHDL 3.4 03/10/2013 1001   VLDL 50* 03/10/2013 1001   LDLCALC 66 03/10/2013 1001    Cardiac Panel (last 3 results)  Recent Labs  06/15/13 1200 06/15/13 1835 06/16/13 0050  TROPONINI <0.30 <0.30 <0.30    Assessment/Plan  Active Problems:   HYPERTENSION   Hyperlipidemia   Type 2 diabetes mellitus   Unstable angina   Hypothyroid   Tracheobronchomalacia  1. [place]: Troponins are negative. Cont ASA/beta blocker. Hold off on adding statin for now  as LFT's are mildly elevated. Plan for [facility] +/- PCI today.    2. HTN: controlled. Continue metoprolol. Will hold HCTZ for cath   3. Lipids: LDL 66 in January. LFT's mildly elevated. Will hold on statin for now   4. DM: Hold metformin in anticipation for LHC. Add SSI.   5. 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 She will likely need BiPap in place to lie flat for cath on Monday as well.   6. Mildly elevated TSH c/w hypothyroidism. Will hold on adding synthroid until after cath given [place].     LOS: 3 days    Brittainy M. Ladoris Gene 06/16/2013 7:32 AM

## 2013-06-17 DIAGNOSIS — I519 Heart disease, unspecified: Secondary | ICD-10-CM

## 2013-06-17 HISTORY — PX: TRANSTHORACIC ECHOCARDIOGRAM: SHX275

## 2013-06-17 LAB — GLUCOSE, CAPILLARY
GLUCOSE-CAPILLARY: 125 mg/dL — AB (ref 70–99)
Glucose-Capillary: 136 mg/dL — ABNORMAL HIGH (ref 70–99)

## 2013-06-17 MED ORDER — ISOSORBIDE MONONITRATE ER 30 MG PO TB24: 30.0000 mg | ORAL_TABLET | Freq: Every day | ORAL | Status: DC

## 2013-06-17 MED ORDER — NITROGLYCERIN 0.4 MG SL SUBL: 0.4000 mg | SUBLINGUAL_TABLET | SUBLINGUAL | Status: DC | PRN

## 2013-06-17 MED ORDER — METOPROLOL TARTRATE 25 MG PO TABS: 25.0000 mg | ORAL_TABLET | Freq: Two times a day (BID) | ORAL | Status: DC

## 2013-06-17 MED ORDER — METFORMIN HCL 500 MG PO TABS: 1000.0000 mg | ORAL_TABLET | Freq: Two times a day (BID) | ORAL | Status: DC

## 2013-06-17 NOTE — Progress Notes (Signed)
  Echocardiogram 2D Echocardiogram has been performed.  Valinda Hoar 06/17/2013, 9:52 AM

## 2013-06-17 NOTE — Discharge Instructions (Signed)
Acute Coronary Syndrome °Acute coronary syndrome (ACS) is an urgent problem in which the blood and oxygen supply to the heart is critically deficient. ACS requires hospitalization because one or more coronary arteries may be blocked. °ACS represents a range of conditions including: °· Previous angina that is now unstable, lasts longer, happens at rest, or is more intense. °· A heart attack, with heart muscle cell injury and death. °There are three vital coronary arteries that supply the heart muscle with blood and oxygen so that it can pump blood effectively. If blockages to these arteries develop, blood flow to the heart muscle is reduced. If the heart does not get enough blood, angina may occur as the first warning sign. °SYMPTOMS  °· The most common signs of angina include: °· Tightness or squeezing in the chest. °· Feeling of heaviness on the chest. °· Discomfort in the arms, neck, or jaw. °· Shortness of breath and nausea. °· Cold, wet skin. °· Angina is usually brought on by physical effort or excitement which increase the oxygen needs of the heart. These states increase the blood flow needs of the heart beyond what can be delivered. °TREATMENT  °· Medicines to help discomfort may include nitroglycerin (nitro) in the form of tablets or a spray for rapid relief, or longer-acting forms such as cream, patches, or capsules. (Be aware that there are many side effects and possible interactions with other drugs). °· Other medicines may be used to help the heart pump better. °· Procedures to open blocked arteries including angioplasty or stent placement to keep the arteries open. °· Open heart surgery may be needed when there are many blockages or they are in critical locations that are best treated with surgery. °HOME CARE INSTRUCTIONS  °· Avoid smoking. °· Take one baby or adult aspirin daily, if your caregiver advises. This helps reduce the risk of a heart attack. °· It is very important that you follow the angina  treatment prescribed by your caregiver. Make arrangements for proper follow-up care. °· Eat a heart healthy diet with salt and fat restrictions as advised. °· Regular exercise is good for you as long as it does not cause discomfort. Do not begin any new type of exercise until you check with your caregiver. °· If you are overweight, you should lose weight. °· Try to maintain normal blood lipid levels. °· Keep your blood pressure under control as recommended by your caregiver. °· You should tell your caregiver right away about any increase in the severity or frequency of your chest discomfort or angina attacks. When you have angina, you should stop what you are doing and sit down. This may bring relief in 3 to 5 minutes. If your caregiver has prescribed nitro, take it as directed. °· If your caregiver has given you a follow-up appointment, it is very important to keep that appointment. Not keeping the appointment could result in a chronic or permanent injury, pain, and disability. If there is any problem keeping the appointment, you must call back to this facility for assistance. °SEEK IMMEDIATE MEDICAL CARE IF:  °· You develop nausea, vomiting, or shortness of breath. °· You feel faint, lightheaded, or pass out. °· Your chest discomfort gets worse. °· You are sweating or experience sudden profound fatigue. °· You do not get relief of your chest pain after 3 doses of nitro. °· Your discomfort lasts longer than 15 minutes. °MAKE SURE YOU:  °· Understand these instructions. °· Will watch your condition. °· Will get help   right away if you are not doing well or get worse. Document Released: 01/30/2005 Document Revised: 04/24/2011 Document Reviewed: 09/03/2007 [facility] Patient Information 2014 [place].  Angina Pectoris Angina pectoris is extreme discomfort in your chest, neck, or arm. Your doctor may call it just angina. It is caused by a lack of oxygen to your heart wall. It may feel like tightness or heavy  pressure. It may feel like a crushing or squeezing pain. Some people say it feels like gas. It may go down your shoulders, back, and arms. Some people have symptoms other than pain. These include:  Tiredness.  Shortness of breath.  Cold sweats.  Feeling sick to your stomach (nausea). There are four types of angina:  Stable angina often lasts the same amount of time each time it happens. Activity, stress, or excitement can bring it on. It often gets better after taking a special medicine called nitroglycerin. This goes under your tongue.  Unstable angina can happen when you are not active or even during sleep. It can suddenly get worse or happen more often. It may not get better after taking the special medicine. It can last up to 30 minutes.  Microvascular angina is more common in women. It may be more severe or last longer than other types.  Prinzmetal angina often happens when you are not active or in the early morning hours. HOME CARE   Only take medicines as told by your doctor.  Stay active or exercise more as told by your doctor.  Limit very hard activity as told by your doctor.  Limit heavy lifting as told by your doctor.  Keep a healthy weight.  Learn about and eat foods that are healthy for your heart.  Do not smoke. GET HELP RIGHT AWAY IF:   You have chest, neck, deep shoulder, or arm pain or discomfort that lasts more than a few minutes.  You have chest, neck, deep shoulder, or arm pain or discomfort that goes away and comes back over and over again.  You have heavy sweating that seems to happen for no reason.  You have shortness of breath or trouble breathing.  Your angina does not get better after a few minutes of rest.  Your angina does not get better after you take nitroglycerin medicine. These can all be symptoms of a heart attack. Get help right away. Call your local emergency service (911 in [place] Do not  drive yourself to the hospital. Do not  wait to  for your symptoms to go away. MAKE SURE YOU:   Understand these instructions.  Will watch your condition.  Will get help right away if you are not doing well or get worse. Document Released: 07/19/2007 Document Revised: 01/17/2012 Document Reviewed: 11/09/2011 [facility] Patient Information 2014 [place] [address], [place].  Coronary Artery Disease, Risk Factors Research has shown that the risk of developing coronary artery disease (CAD) and having a heart attack increases with each factor you have. RISK FACTORS YOU CANNOT CHANGE  Your age. Your risk goes up as you get older. Most heart attacks happen to people over the age of 45.  Gender. Men have a greater risk of heart attack than women, and they have attacks earlier in life. However, women are more likely to die from a heart attack.  Heredity. Children of parents with heart disease are more likely to develop it themselves.  Race. African Americans and other ethnic groups have a higher risk, possibly because of high blood pressure, a tendency toward obesity,  and diabetes.  Your family. Most people with a strong family history of heart disease have one or more other risk factors. RISK FACTORS YOU CAN CHANGE  Exposure to tobacco smoke. Even secondhand smoke greatly increases the risk for heart disease.  High blood cholesterol may be lowered with changes in diet, activity, and medicines.  High blood pressure makes the heart work harder. This causes the heart muscles to become thick and, eventually, weaker. It also increases your risk of stroke, heart attack, and kidney or heart failure.  Physical inactivity is a risk factor for CAD. Regular physical activity helps prevent heart and blood vessel disease. Exercise helps control blood cholesterol, diabetes, obesity, and it may help lower blood pressure in some people.  Excess body fat, especially belly fat, increases the risk of heart disease and stroke even if there are no other risk factors.  Excess weight increases the heart's workload and raises blood pressure and blood cholesterol.  Diabetes seriously increases your risk of developing CAD. If you have diabetes, you should work with your caregiver to manage it and control other risk factors. OTHER RISK FACTORS FOR CAD  How you respond to stress.  Drinking too much alcohol may raise blood pressure, cause heart failure, and lead to stroke.  Total cholesterol greater than 200 milligrams.  HDL (good) cholesterol less than 40 milligrams. HDL helps keep cholesterol from building up in the walls of the arteries. PREVENTING CAD  Maintain a healthy weight.  Exercise or do physical activity.  Eat a heart-healthy diet low in fat and salt and high in fiber.  Control your blood pressure to keep it below 120 over 80.  Keep your cholesterol at a level that lowers your risk.  Manage diabetes if you have it.  Stop smoking.  Learn how to manage stress. HEART SMART SUBSTITUTIONS  Instead of whole or 2% milk and cream, use skim milk.  Instead of fried foods, eat baked, steamed, boiled, broiled, or microwaved foods.  Instead of lard, butter, palm and coconut oils, cook with unsaturated vegetable oils, such as corn, olive, canola, safflower, sesame, soybean, sunflower, or peanut.  Instead of fatty cuts of meat, eat lean cuts of meat or cut off the fatty parts.  Instead of 1 whole egg in recipes, use 2 egg whites.  Instead of sauces, butter, and salt, season vegetables with herbs and spices.  Instead of regular hard and processed cheeses, eat low-fat, low-sodium cheeses.  Instead of salted potato chips, choose low-fat, unsalted tortilla and potato chips and unsalted pretzels and popcorn.  Instead of sour cream and mayonnaise, use plain low-fat yogurt, low-fat cottage cheese, or low-fat or "light" sour cream. FOR MORE INFORMATION  National Heart Lung and Blood Institute: http://www.ball-ray.net/ American Heart  Association: weddingcandid.com Document Released: 04/22/2003 Document Revised: 04/24/2011 Document Reviewed: 04/17/2007 [employer] Patient Information 2014 [place], [place].

## 2013-06-17 NOTE — Progress Notes (Signed)
UR Completed Merel Santoli Graves-Bigelow, RN,BSN [phone]  

## 2013-06-17 NOTE — Progress Notes (Addendum)
Now on up-titrated anti anginal regimen, BB and LA nitrate. Ready for d/c and f/u with Dr. Ellyn Hack in 1-2 weeks.

## 2013-06-17 NOTE — Discharge Summary (Signed)
CARDIOLOGY DISCHARGE SUMMARY   Patient ID: April [name] MRN: [id number] DOB/AGE: 04/13/1940 73 y.o.  Admit date: 06/13/2013 Discharge date: 06/19/2013  PCP: Odette Fraction, MD Primary Cardiologist: Dr. Ellyn Hack  Primary Discharge Diagnosis:    Unstable angina Secondary Discharge Diagnosis:    HYPERTENSION   Hyperlipidemia   Type 2 diabetes mellitus   Hypothyroid   Tracheobronchomalacia  Procedures: Cardiac catheterization, coronary arteriogram  Hospital Course: April [name] is a 73 y.o. [name] with no history of CAD.  She came to the hospital with a three-week history of chest pain. It was progressive and concerning for angina. She has multiple cardiac risk factors. She was admitted for further evaluation and treatment.  Her cardiac enzymes were negative for MI. She remained pain free on medical therapy including aspirin, heparin and nitrates when necessary. She was started on a beta blocker. A statin was not added because of abnormal LFTs.  She was taken to the cardiac cath lab on 05/04. Results are below. She tolerated the procedure well. She had significant disease in a bifurcation lesion but after reviewing the films, Dr. Ellyn Hack and Dr. Burt Knack felt that the lesion was unfavorable for PCI and medical therapy was the best option. She was started on oral nitrates. An echocardiogram was performed, results below.  Her blood sugars were managed with a combination of her medications and sliding scale insulin. Her metformin was held on admission and will be restarted 48 hours post-cath.  Her respiratory status was monitored carefully because of her history of tracheobronchomalacia. She had no problems during the cardiac catheterization and her respiratory status is at baseline. She was continued on BiPAP by respiratory therapy at bedtime daily during her hospital stay.  A TSH was checked and was slightly elevated. She is to follow up with primary care for this. Further thyroid  function testing can be performed as an outpatient.  On 05/05, she was seen by Dr. Tamala Julian and all data were reviewed. She was having no chest pain with ambulation and tolerating medications well. No further inpatient workup is indicated and she is considered stable for discharge, to follow up as an outpatient.  Labs:   Lab Results  Component Value Date   WBC 8.0 06/13/2013   HGB 14.3 06/13/2013   HCT 43.6 06/13/2013   MCV 92.4 06/13/2013   PLT 236 06/13/2013     Recent Labs Lab 06/14/13 0511  NA 146  K 4.2  CL 103  CO2 27  BUN 19  CREATININE 0.57  CALCIUM 9.2  PROT 7.3  BILITOT 0.3  ALKPHOS 54  ALT 94*  AST 59*  GLUCOSE 99   No results found for this basename: CKTOTAL, CKMB, CKMBINDEX, TROPONINI,  in the last 72 hours Lab Results  Component Value Date   TSH 6.810* 06/13/2013    Radiology: Dg Chest 2 View 06/13/2013   CLINICAL DATA:  Chest pain, cough and shortness of breath.  EXAM: CHEST  2 VIEW  COMPARISON:  06/17/2010.  FINDINGS: Poor inspiration. Stable normal sized heart and diffuse peribronchial thickening. Small to moderate-sized hiatal hernia. Diffuse osteopenia. Thoracic spine degenerative changes.  IMPRESSION: 1. No acute abnormality. 2. Stable chronic bronchitic changes. 3. Small to moderate-sized hiatal hernia.   Electronically Signed   By: Enrique Sack M.D.   On: 06/13/2013 17:54    Cardiac Cath: 06/16/2013 Coronary Anatomy: Left dominant  Left Main: Large-caliber vessel that trifurcates into the LAD, Circumflex, and Ramus Intermedius. Angiographically normal. LAD: Large-caliber vessel that  it is somewhat tortuous course. There is mild diffuse luminal irregularities proximally. There is a stent smaller moderate caliber bifurcating diagonal branch followed by a sharp bend in the LAD with a roughly 40-50% stenosis in the bend. The remainder the vessel takes a somewhat tortuous course around the apex withmother significant lesions.  D1: Small large-caliber vessel that  bifurcates into 2 small caliber branches. Left Circumflex: Large-caliber vessel it takes a very hyperacute angle off of the left main. It tapers to a roughly 70-80% stenosis prior to the bifurcation into a large lateral OM and the follow on AV Groove Circumflex which gives off a Posterior Lateral Branch and the Posterior Descending Artery. The ostia of both branch vessels are involved with roughly 50% in the OM 1 and 60% in AV Groove Circumflex. The branches both take hyperacute almost hairpin type turns in the cephalad direction before taking yet again another sharp hairpin turn back call to resume a normal courses for the vessels. These sharp turns make this lesion very unfavorable for PCI options.  OM1: Large-caliber vessel with a tortuous course as noted above. He ostium is noted to have a roughly 50% stenosis. As the vessel is relatively free of disease, tortuous with a small distal branch. Essentially is in the main lateral OM.  AV Groove Circumflex: Again takes a tortuous course of toward the atrium before turning back down in the AV groove. It gives off a small pair of posterolateral branches with a major arch caliber descending artery. In the distal portion prior to the PDA has a roughly 40% eccentric tubular lesion. Ramus intermedius: Moderate caliber vessel that courses as a diagonal along the anterolateral wall. Minimal luminal arteries.  RCA: Very small, nondominant vessel with ostial 50% stenosis.  After reviewing the initial angiography, the culprit lesion was thought to be the occasion point of the circumflex into OM1 and AV groove. After reviewing these images with Dr. Burt Knack, we both agreed to this is a very unfavorable lesion for PCI. Best plan to be for medical therapy as the extreme nature the bands in this vessel make it very difficult to consider safely pass a stent at the bifurcation stenting..  EKG: 06/15/2013 Sinus rhythm, no acute ischemic changes Vent. rate 65 BPM PR interval  166 ms QRS duration 88 ms QT/QTc 436/453 ms P-R-T axes 48 20 57  Echo: 06/17/2013 Study Conclusions Left ventricle: The cavity size was normal. Wall thickness was normal. Systolic function was normal. The estimated ejection fraction was in the range of 55% to 60%. Wall motion was normal; there were no regional wall motion abnormalities. Doppler parameters are consistent with abnormal left ventricular relaxation (grade 1 diastolic dysfunction).    FOLLOW UP PLANS AND APPOINTMENTS No Known Allergies   Medication List         aspirin 81 MG tablet  Take 81 mg by mouth daily.     CALCIUM 600 + D PO  Take 1 tablet by mouth daily.     Fish Oil 1000 MG Caps  Take 1 capsule by mouth daily.     gabapentin 600 MG tablet  Commonly known as:  NEURONTIN  Take 600 mg by mouth 4 (four) times daily.     glipiZIDE 5 MG 24 hr tablet  Commonly known as:  GLUCOTROL XL  Take 5 mg by mouth at bedtime.     hydrochlorothiazide 12.5 MG capsule  Commonly known as:  MICROZIDE  Take 12.5 mg by mouth daily.     isosorbide  mononitrate 30 MG 24 hr tablet  Commonly known as:  IMDUR  Take 1 tablet (30 mg total) by mouth daily.     metFORMIN 500 MG tablet  Commonly known as:  GLUCOPHAGE  Take 2 tablets (1,000 mg total) by mouth 2 (two) times daily with a meal. HOLD for 48 hours, restart on 05/07     metoprolol tartrate 25 MG tablet  Commonly known as:  LOPRESSOR  Take 1 tablet (25 mg total) by mouth 2 (two) times daily.     multivitamin capsule  Take 1 capsule by mouth daily.     nitroGLYCERIN 0.4 MG SL tablet  Commonly known as:  NITROSTAT  Place 1 tablet (0.4 mg total) under the tongue every 5 (five) minutes x 3 doses as needed for chest pain.        Discharge Orders   Future Appointments Provider Department Dept Phone   06/23/2013 3:00 PM Susy Frizzle, MD [facility] [phone]   07/01/2013 10:30 AM Brittainy Rosita Fire, PA-C [facility] [phone]    Future Orders Complete By Expires   Diet - low sodium heart healthy  As directed    Diet Carb Modified  As directed    Increase activity slowly  As directed      Follow-up Information   Follow up with Lyda Jester, PA-C On 07/01/2013. (at 10:30 am)    Specialty:  Cardiology   Contact information:   [address] [place] [address] [phone]       BRING ALL MEDICATIONS WITH YOU TO FOLLOW UP APPOINTMENTS  Time spent with patient to include physician time: 38 min Signed: Lonn Georgia, PA-C 06/19/2013, 8:00 AM Co-Sign MD

## 2013-06-17 NOTE — Progress Notes (Signed)
Reviewed discharge instructions with patient and family, they stated their understanding.  Patient given instruction on how to use NTG SL and patient able to state how to use.  Discharged home with family.  Sanda Linger

## 2013-06-19 ENCOUNTER — Telehealth: Payer: Self-pay | Admitting: Cardiology

## 2013-06-19 NOTE — Discharge Summary (Signed)
Now on up-titrated anti anginal regimen, BB and LA nitrate. Ready for d/c and f/u with Dr. Ellyn Hack in 1-2 weeks.Note is accurate and all plans reviewed.

## 2013-06-19 NOTE — Telephone Encounter (Signed)
Called patient. Advised to take imdur at night. If headaches do not subside, call office, cause may can decrease to 15mg  QD. Patient voiced understanding.

## 2013-06-19 NOTE — Telephone Encounter (Signed)
Just got out of hospital on Tuesday and have had a headache since she came home . Thinks its the medication  Isosorbide Mononitrate 30mg .. Please Call    Thanks

## 2013-06-23 ENCOUNTER — Encounter: Payer: Self-pay | Admitting: Family Medicine

## 2013-06-23 ENCOUNTER — Ambulatory Visit (INDEPENDENT_AMBULATORY_CARE_PROVIDER_SITE_OTHER): Payer: Medicare Other | Admitting: Family Medicine

## 2013-06-23 VITALS — BP 140/70 | HR 72 | Temp 97.6°F | Resp 18 | Ht 62.0 in | Wt 168.0 lb

## 2013-06-23 DIAGNOSIS — E119 Type 2 diabetes mellitus without complications: Secondary | ICD-10-CM

## 2013-06-23 DIAGNOSIS — Z09 Encounter for follow-up examination after completed treatment for conditions other than malignant neoplasm: Secondary | ICD-10-CM

## 2013-06-23 DIAGNOSIS — E785 Hyperlipidemia, unspecified: Secondary | ICD-10-CM

## 2013-06-23 DIAGNOSIS — R945 Abnormal results of liver function studies: Secondary | ICD-10-CM

## 2013-06-23 DIAGNOSIS — R7989 Other specified abnormal findings of blood chemistry: Secondary | ICD-10-CM

## 2013-06-23 MED ORDER — TRAMADOL HCL 50 MG PO TABS: 50.0000 mg | ORAL_TABLET | Freq: Three times a day (TID) | ORAL | Status: DC | PRN

## 2013-06-23 MED ORDER — SIMVASTATIN 20 MG PO TABS: 20.0000 mg | ORAL_TABLET | Freq: Every day | ORAL | Status: DC

## 2013-06-23 NOTE — Progress Notes (Signed)
Subjective:    Patient ID: April [name], female    DOB: May 16, 1940, 73 y.o.   MRN: [id number]  HPI Admit date: 06/13/2013  Discharge date: 06/19/2013  PCP: Odette Fraction, MD  Primary Cardiologist: Dr. Ellyn Hack  Primary Discharge Diagnosis: Unstable angina  Secondary Discharge Diagnosis:  HYPERTENSION  Hyperlipidemia  Type 2 diabetes mellitus  Hypothyroid  Tracheobronchomalacia  Procedures: Cardiac catheterization, coronary arteriogram  Hospital Course: April [name] is a 73 y.o. female with no history of CAD. She came to the hospital with a three-week history of chest pain. It was progressive and concerning for angina. She has multiple cardiac risk factors. She was admitted for further evaluation and treatment.  Her cardiac enzymes were negative for MI. She remained pain free on medical therapy including aspirin, heparin and nitrates when necessary. She was started on a beta blocker. A statin was not added because of abnormal LFTs.  She was taken to the cardiac cath lab on 05/04. Results are below. She tolerated the procedure well. She had significant disease in a bifurcation lesion but after reviewing the films, Dr. Ellyn Hack and Dr. Burt Knack felt that the lesion was unfavorable for PCI and medical therapy was the best option. She was started on oral nitrates. An echocardiogram was performed, results below.  Her blood sugars were managed with a combination of her medications and sliding scale insulin. Her metformin was held on admission and will be restarted 48 hours post-cath.  Her respiratory status was monitored carefully because of her history of tracheobronchomalacia. She had no problems during the cardiac catheterization and her respiratory status is at baseline. She was continued on BiPAP by respiratory therapy at bedtime daily during her hospital stay.  A TSH was checked and was slightly elevated. She is to follow up with primary care for this. Further thyroid function testing can be  performed as an outpatient.  On 05/05, she was seen by Dr. Tamala Julian and all data were reviewed. She was having no chest pain with ambulation and tolerating medications well. No further inpatient workup is indicated and she is considered stable for discharge, to follow up as an outpatient.  Labs:  Lab Results   Component  Value  Date    WBC  8.0  06/13/2013    HGB  14.3  06/13/2013    HCT  43.6  06/13/2013    MCV  92.4  06/13/2013    PLT  236  06/13/2013   Recent Labs  Lab  06/14/13 0511   NA  146   K  4.2   CL  103   CO2  27   BUN  19   CREATININE  0.57   CALCIUM  9.2   PROT  7.3   BILITOT  0.3   ALKPHOS  54   ALT  94*   AST  59*   GLUCOSE  99   No results found for this basename: CKTOTAL, CKMB, CKMBINDEX, TROPONINI, in the last 72 hours  Lab Results   Component  Value  Date    TSH  6.810*  06/13/2013   Radiology: Dg Chest 2 View  06/13/2013 CLINICAL DATA: Chest pain, cough and shortness of breath. EXAM: CHEST 2 VIEW COMPARISON: 06/17/2010. FINDINGS: Poor inspiration. Stable normal sized heart and diffuse peribronchial thickening. Small to moderate-sized hiatal hernia. Diffuse osteopenia. Thoracic spine degenerative changes. IMPRESSION: 1. No acute abnormality. 2. Stable chronic bronchitic changes. 3. Small to moderate-sized hiatal hernia. Electronically Signed By: Enrique Sack M.D. On: 06/13/2013 17:54  Cardiac Cath: 06/16/2013  Coronary Anatomy: Left dominant  Left Main: Large-caliber vessel that trifurcates into the LAD, Circumflex, and Ramus Intermedius. Angiographically normal. LAD: Large-caliber vessel that it is somewhat tortuous course. There is mild diffuse luminal irregularities proximally. There is a stent smaller moderate caliber bifurcating diagonal branch followed by a sharp bend in the LAD with a roughly 40-50% stenosis in the bend. The remainder the vessel takes a somewhat tortuous course around the apex withmother significant lesions.  D1: Small large-caliber vessel that bifurcates  into 2 small caliber branches. Left Circumflex: Large-caliber vessel it takes a very hyperacute angle off of the left main. It tapers to a roughly 70-80% stenosis prior to the bifurcation into a large lateral OM and the follow on AV Groove Circumflex which gives off a Posterior Lateral Branch and the Posterior Descending Artery. The ostia of both branch vessels are involved with roughly 50% in the OM 1 and 60% in AV Groove Circumflex. The branches both take hyperacute almost hairpin type turns in the cephalad direction before taking yet again another sharp hairpin turn back call to resume a normal courses for the vessels. These sharp turns make this lesion very unfavorable for PCI options.  OM1: Large-caliber vessel with a tortuous course as noted above. He ostium is noted to have a roughly 50% stenosis. As the vessel is relatively free of disease, tortuous with a small distal branch. Essentially is in the main lateral OM.  AV Groove Circumflex: Again takes a tortuous course of toward the atrium before turning back down in the AV groove. It gives off a small pair of posterolateral branches with a major arch caliber descending artery. In the distal portion prior to the PDA has a roughly 40% eccentric tubular lesion. Ramus intermedius: Moderate caliber vessel that courses as a diagonal along the anterolateral wall. Minimal luminal arteries.  RCA: Very small, nondominant vessel with ostial 50% stenosis.  After reviewing the initial angiography, the culprit lesion was thought to be the occasion point of the circumflex into OM1 and AV groove. After reviewing these images with Dr. Burt Knack, we both agreed to this is a very unfavorable lesion for PCI. Best plan to be for medical therapy as the extreme nature the bands in this vessel make it very difficult to consider safely pass a stent at the bifurcation stenting..  EKG: 06/15/2013  Sinus rhythm, no acute ischemic changes  Vent. rate 65 BPM  PR interval 166 ms    QRS duration 88 ms  QT/QTc 436/453 ms  P-R-T axes 48 20 57  Echo: 06/17/2013  Study Conclusions Left ventricle: The cavity size was normal. Wall thickness was normal. Systolic function was normal. The estimated ejection fraction was in the range of 55% to 60%. Wall motion was normal; there were no regional wall motion abnormalities. Doppler parameters are consistent with abnormal left ventricular relaxation (grade 1 diastolic dysfunction).  FOLLOW UP PLANS AND APPOINTMENTS  No Known Allergies    Medication List         aspirin 81 MG tablet    Take 81 mg by mouth daily.    CALCIUM 600 + D PO    Take 1 tablet by mouth daily.    Fish Oil 1000 MG Caps    Take 1 capsule by mouth daily.    gabapentin 600 MG tablet    Commonly known as: NEURONTIN    Take 600 mg by mouth 4 (four) times daily.    glipiZIDE 5 MG 24 hr tablet  Commonly known as: GLUCOTROL XL    Take 5 mg by mouth at bedtime.    hydrochlorothiazide 12.5 MG capsule    Commonly known as: MICROZIDE    Take 12.5 mg by mouth daily.    isosorbide mononitrate 30 MG 24 hr tablet    Commonly known as: IMDUR    Take 1 tablet (30 mg total) by mouth daily.    metFORMIN 500 MG tablet    Commonly known as: GLUCOPHAGE    Take 2 tablets (1,000 mg total) by mouth 2 (two) times daily with a meal. HOLD for 48 hours, restart on 05/07    metoprolol tartrate 25 MG tablet    Commonly known as: LOPRESSOR    Take 1 tablet (25 mg total) by mouth 2 (two) times daily.    multivitamin capsule    Take 1 capsule by mouth daily.    nitroGLYCERIN 0.4 MG SL tablet    Commonly known as: NITROSTAT    Place 1 tablet (0.4 mg total) under the tongue every 5 (five) minutes x 3 doses as needed for chest pain.      Patient is here today for follow up.  Overall she is doing well since discharge from the hospital.  She does report a vague daily headache due to the isosorbide mononitrate.  However she had no further chest pain. She is on maximal medical  therapy for coronary artery disease except for the fact she is not on a statin.  I stopped the simvastatin in January when her ALT rose from 38-70. However off the statin and off Tylenol, the patient's liver tests had risen greater than 90 in the hospital.  I believe this is likely due to fatty liver disease.  The patient is also asking what he can take for arthritis pain now but she cannot take Tylenol and she is concerned about taking an element daily basis due to her coronary artery disease. Past Medical History  Diagnosis Date  . Abnormal weight gain   . Hyperlipidemia   . GERD (gastroesophageal reflux disease)   . Hypertension   . Diverticulosis   . History of stress test     a. 09/2006 nl Dobutamine Echo  . Tracheobronchomalacia     a. followed by [facility]  Marland Kitchen COPD (chronic obstructive pulmonary disease)   . On home oxygen therapy     "2L q hs; runs into my BIPAP" (06/13/2013)  . OSA (obstructive sleep apnea)     "BIPAP w/O2" (06/13/2013)  . NIDDM (non-insulin dependent diabetes mellitus)   . Arthritis     "joints" (06/13/2013)   Past Surgical History  Procedure Laterality Date  . Tubal ligation     Current Outpatient Prescriptions on File Prior to Visit  Medication Sig Dispense Refill  . aspirin 81 MG tablet Take 81 mg by mouth daily.        . Calcium Carbonate-Vitamin D (CALCIUM 600 + D PO) Take 1 tablet by mouth daily.        Marland Kitchen gabapentin (NEURONTIN) 600 MG tablet Take 600 mg by mouth 4 (four) times daily.      Marland Kitchen glipiZIDE (GLUCOTROL XL) 5 MG 24 hr tablet Take 5 mg by mouth at bedtime.      . hydrochlorothiazide (MICROZIDE) 12.5 MG capsule Take 12.5 mg by mouth daily.      . isosorbide mononitrate (IMDUR) 30 MG 24 hr tablet Take 1 tablet (30 mg total) by mouth daily.  30 tablet  11  .  metFORMIN (GLUCOPHAGE) 500 MG tablet Take 2 tablets (1,000 mg total) by mouth 2 (two) times daily with a meal. HOLD for 48 hours, restart on 05/07      . metoprolol tartrate (LOPRESSOR) 25 MG  tablet Take 1 tablet (25 mg total) by mouth 2 (two) times daily.  60 tablet  11  . Multiple Vitamin (MULTIVITAMIN) capsule Take 1 capsule by mouth daily.        . nitroGLYCERIN (NITROSTAT) 0.4 MG SL tablet Place 1 tablet (0.4 mg total) under the tongue every 5 (five) minutes x 3 doses as needed for chest pain.  25 tablet  12  . Omega-3 Fatty Acids (FISH OIL) 1000 MG CAPS Take 1 capsule by mouth daily.         No current facility-administered medications on file prior to visit.   No Known Allergies History   Social History  . Marital Status: Married    Spouse Name: N/A    Number of Children: 3  . Years of Education: N/A   Occupational History  . Systems analyst    Social History Main Topics  . Smoking status: Former Smoker -- 1.50 packs/day for 20 years    Types: Cigarettes    Quit date: 02/13/1986  . Smokeless tobacco: Never Used  . Alcohol Use: No  . Drug Use: No  . Sexual Activity: Yes   Other Topics Concern  . Not on file   Social History Narrative   Lives in [place] with her husband. 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      Review of Systems  All other systems reviewed and are negative.      Objective:   Physical Exam  Vitals reviewed. Constitutional: She is oriented to person, place, and time. She appears well-developed and well-nourished. No distress.  Neck: Neck supple. No JVD present. No thyromegaly present.  Cardiovascular: Normal rate, regular rhythm, normal heart sounds and intact distal pulses.  Exam reveals no gallop and no friction rub.   No murmur heard. Pulmonary/Chest: Effort normal and breath sounds normal. No respiratory distress. She has no wheezes. She has no rales.  Abdominal: Soft. Bowel sounds are normal. She exhibits no distension. There is no tenderness. There is no rebound and no guarding.  Musculoskeletal: She exhibits no edema.  Lymphadenopathy:    She has no cervical adenopathy.  Neurological: She is alert and oriented to person, place,  and time. She has normal reflexes. No cranial nerve deficit.  Skin: She is not diaphoretic.          Assessment & Plan:  1. Hospital discharge follow-up  2. HLD (hyperlipidemia)  3. Elevated LFTs  4. Type II or unspecified type diabetes mellitus without mention of complication, not stated as uncontrolled  I believe the benefit from taking a daily statin outweighs the risk to her liver with mild fatty liver disease. Therefore I will start the patient back on simvastatin 20 mg by mouth daily due to the known coronary artery disease. Recommended that she return for a CMP in one month. If her liver function tests dramatically rise I will discontinue his statin. Otherwise I would be willing to accept chronic elevations in her liver function test between 70 and 100.  I do not the patient taking Tylenol. I also cautioned her against daily use of NSAIDs due to increased risk of cardiovascular disease. I am fine with her using ibuprofen sparingly for headache and joint pain.  I also gave the patient tramadol 50 mg every 8 hours  when necessary severe joint pain. She can use this at night to help her sleep.  Recheck diabetes tests in 3 months along with a fasting lipid panel.

## 2013-07-01 ENCOUNTER — Ambulatory Visit (INDEPENDENT_AMBULATORY_CARE_PROVIDER_SITE_OTHER): Payer: Medicare Other | Admitting: Cardiology

## 2013-07-01 ENCOUNTER — Encounter: Payer: Self-pay | Admitting: Cardiology

## 2013-07-01 VITALS — BP 128/72 | HR 73 | Ht 61.0 in | Wt 168.0 lb

## 2013-07-01 DIAGNOSIS — I10 Essential (primary) hypertension: Secondary | ICD-10-CM

## 2013-07-01 DIAGNOSIS — I2510 Atherosclerotic heart disease of native coronary artery without angina pectoris: Secondary | ICD-10-CM

## 2013-07-01 DIAGNOSIS — E119 Type 2 diabetes mellitus without complications: Secondary | ICD-10-CM

## 2013-07-01 DIAGNOSIS — E039 Hypothyroidism, unspecified: Secondary | ICD-10-CM

## 2013-07-01 DIAGNOSIS — E785 Hyperlipidemia, unspecified: Secondary | ICD-10-CM

## 2013-07-01 DIAGNOSIS — I25119 Atherosclerotic heart disease of native coronary artery with unspecified angina pectoris: Secondary | ICD-10-CM | POA: Insufficient documentation

## 2013-07-01 NOTE — Progress Notes (Signed)
Patient ID: April [name], female   DOB: 03-04-1940, 73 y.o.   MRN: [id number]    07/01/2013 April [name]   10-29-1940  [id number]  Primary Physicia Jenna Luo TOM, MD Primary Cardiologist: Dr. Ellyn Hack   April [name] presents to clinic today for post hospital followup after recent admission for chest pain.  HPI:  April [name] is a 73 y.o. female with no history of CAD. Her past medical history is significant for hypertension, hyperlipidemia, remote tobacco abuse and family history of premature CAD. She presented to the hospital on 06/16/13 with a three-week history of chest pain. It was progressive and concerning for angina. She was admitted for further evaluation and treatment. Her cardiac enzymes were negative for MI. She remained pain free on medical therapy including aspirin, heparin and nitrates when necessary. She was started on a beta blocker. A statin was not added because of abnormal LFTs. She was taken to the cardiac cath lab on 05/04. She had significant disease in a bifurcation lesion but after reviewing the films, Dr. Ellyn Hack and Dr. Burt Knack felt that the lesion was unfavorable for PCI and medical therapy was the best option.  EF was estimated at 55-60%. She was started on oral nitrates. Her CP resolved. She had no post-cath issues. It should also be noted that a TSH was checked and was slightly elevated. It was felt that further thyroid function testing could be performed as an outpatient. It was recommended that she followup with her PCP for further evaluation. She was discharged home on 06/19/13.  She presents back today for post hospital followup. She states that she has been doing well since discharge. She denies any recurrent chest pain. She also denies shortness of breath, dizziness, syncope/near syncope, lower extremity edema, orthopnea, PND. She has not required it any sublingual nitroglycerin use. She has been fully compliant with her medications. She states that she has followup  with her PCP but states that she was not aware of an abnormal TSH. When asked about symptoms, she does endorse fatigue, weight gain, dry skin and cold intolerances.    Current Outpatient Prescriptions  Medication Sig Dispense Refill  . aspirin 81 MG tablet Take 81 mg by mouth daily.        . Calcium Carbonate-Vitamin D (CALCIUM 600 + D PO) Take 1 tablet by mouth daily.        Marland Kitchen gabapentin (NEURONTIN) 600 MG tablet Take 600 mg by mouth 4 (four) times daily.      Marland Kitchen glipiZIDE (GLUCOTROL XL) 5 MG 24 hr tablet Take 5 mg by mouth at bedtime.      . hydrochlorothiazide (MICROZIDE) 12.5 MG capsule Take 12.5 mg by mouth daily.      . isosorbide mononitrate (IMDUR) 30 MG 24 hr tablet Take 1 tablet (30 mg total) by mouth daily.  30 tablet  11  . metFORMIN (GLUCOPHAGE) 500 MG tablet Take 2 tablets (1,000 mg total) by mouth 2 (two) times daily with a meal. HOLD for 48 hours, restart on 05/07      . metoprolol tartrate (LOPRESSOR) 25 MG tablet Take 1 tablet (25 mg total) by mouth 2 (two) times daily.  60 tablet  11  . Multiple Vitamin (MULTIVITAMIN) capsule Take 1 capsule by mouth daily.        . nitroGLYCERIN (NITROSTAT) 0.4 MG SL tablet Place 1 tablet (0.4 mg total) under the tongue every 5 (five) minutes x 3 doses as needed for chest pain.  25 tablet  12  .  Omega-3 Fatty Acids (FISH OIL) 1000 MG CAPS Take 1 capsule by mouth daily.        . simvastatin (ZOCOR) 20 MG tablet Take 1 tablet (20 mg total) by mouth at bedtime.  90 tablet  3  . traMADol (ULTRAM) 50 MG tablet Take 1 tablet (50 mg total) by mouth every 8 (eight) hours as needed.  60 tablet  0   No current facility-administered medications for this visit.    No Known Allergies  History   Social History  . Marital Status: Married    Spouse Name: N/A    Number of Children: 3  . Years of Education: N/A   Occupational History  . Systems analyst    Social History Main Topics  . Smoking status: Former Smoker -- 1.50 packs/day for 20 years      Types: Cigarettes    Quit date: 02/13/1986  . Smokeless tobacco: Never Used  . Alcohol Use: No  . Drug Use: No  . Sexual Activity: Yes   Other Topics Concern  . Not on file   Social History Narrative   Lives in [place] with her husband. 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     Review of Systems: General: negative for chills, fever, night sweats or weight changes.  Cardiovascular: negative for chest pain, dyspnea on exertion, edema, orthopnea, palpitations, paroxysmal nocturnal dyspnea or shortness of breath Dermatological: negative for rash Respiratory: negative for cough or wheezing Urologic: negative for hematuria Abdominal: negative for nausea, vomiting, diarrhea, bright red blood per rectum, melena, or hematemesis Neurologic: negative for visual changes, syncope, or dizziness All other systems reviewed and are otherwise negative except as noted above.    Blood pressure 128/72, pulse 73, height 5\' 1"  (1.549 m), weight 168 lb (76.204 kg).  General appearance: alert, cooperative and no distress Neck: no carotid bruit and no JVD Lungs: clear to auscultation bilaterally Heart: regular rate and rhythm, S1, S2 normal, no murmur, click, rub or gallop Extremities: no LEE Pulses: 2+ and symmetric Skin: warm and dry Neurologic: Grossly normal  EKG NSR 73 bpm  ASSESSMENT AND PLAN:   CAD (coronary artery disease) Recent left heart cath demonstrated significant  bifurcation disease in the Dominant L Circumflex (Cx), unfavorable for PCI. Medical therapy was recommended. She denies further angina with the addition of aspirin metoprolol and Imdur. Continue current plan of care.  Hypertension Well-controlled at 128/72. continue hydrochlorothiazide and Lopressor.   Hypothyroid TSH was slightly elevated during recent hospitalization. Patient was instructed to followup with her PCP.  Type 2 diabetes mellitus Managed by her PCP.  Hyperlipidemia Managed by her PCP. Her statin was held  during her recent hospitalization due to elevated liver enzymes. However, she was seen buy her PCP for post hospital followup and he has restarted her on simvastatin. He will continue to follow her lipid profile and hepatic function.    PLAN   She appears to be doing well post discharge and denies any recurrent angina now that she is on medical therapy which includes the addition of oral nitrates. Heart rate and blood pressure is well controlled. Continue current plan of care. She has been instructed to followup with Dr. Ellyn Hack in 6-8 weeks.      Brittainy SimmonsPA-C 07/01/2013 7:34 PM

## 2013-07-01 NOTE — Assessment & Plan Note (Signed)
Recent left heart cath demonstrated significant  bifurcation disease in the Dominant L Circumflex (Cx), unfavorable for PCI. Medical therapy was recommended. She denies further angina with the addition of aspirin metoprolol and Imdur. Continue current plan of care.

## 2013-07-01 NOTE — Assessment & Plan Note (Signed)
Well-controlled at 128/72. continue hydrochlorothiazide and Lopressor.

## 2013-07-01 NOTE — Patient Instructions (Signed)
1. Continue same meds  2.Your physician recommends that you schedule a follow-up appointment in:  2 -3 months with Dr. Ellyn Hack

## 2013-07-01 NOTE — Assessment & Plan Note (Signed)
Managed by her PCP. Her statin was held during her recent hospitalization due to elevated liver enzymes. However, she was seen buy her PCP for post hospital followup and he has restarted her on simvastatin. He will continue to follow her lipid profile and hepatic function.

## 2013-07-01 NOTE — Assessment & Plan Note (Signed)
TSH was slightly elevated during recent hospitalization. Patient was instructed to followup with her PCP.

## 2013-07-01 NOTE — Assessment & Plan Note (Signed)
Managed by her PCP

## 2013-07-17 ENCOUNTER — Encounter: Payer: Self-pay | Admitting: Family Medicine

## 2013-07-17 ENCOUNTER — Ambulatory Visit (INDEPENDENT_AMBULATORY_CARE_PROVIDER_SITE_OTHER): Payer: Medicare Other | Admitting: Family Medicine

## 2013-07-17 VITALS — BP 150/82 | HR 76 | Temp 98.2°F | Resp 16 | Ht 62.0 in | Wt 170.0 lb

## 2013-07-17 DIAGNOSIS — R5381 Other malaise: Secondary | ICD-10-CM

## 2013-07-17 DIAGNOSIS — R5383 Other fatigue: Principal | ICD-10-CM

## 2013-07-17 DIAGNOSIS — G4700 Insomnia, unspecified: Secondary | ICD-10-CM

## 2013-07-17 MED ORDER — ALPRAZOLAM 0.5 MG PO TABS: 0.5000 mg | ORAL_TABLET | Freq: Every evening | ORAL | Status: DC | PRN

## 2013-07-17 NOTE — Progress Notes (Signed)
Subjective:    Patient ID: April [name], female    DOB: [birth year], 73 y.o.   MRN: [id number]  HPI My plan from my last office visit (06/23/13): 1. Hospital discharge follow-up 2. HLD (hyperlipidemia) 3. Elevated LFTs 4. Type II or unspecified type diabetes mellitus without mention of complication, not stated as uncontrolled On cardiac catheterization, she had significant disease in a bifurcation lesion but after reviewing the films, Dr. Ellyn Hack and Dr. Burt Knack felt that the lesion was unfavorable for PCI and medical therapy was the best option. She was started on oral nitrates.  I believe the benefit from taking a daily statin outweighs the risk to her liver with mild fatty liver disease. Therefore I will start the patient back on simvastatin 20 mg by mouth daily due to the known coronary artery disease. Recommended that she return for a CMP in one month. If her liver function tests dramatically rise I will discontinue his statin. Otherwise I would be willing to accept chronic elevations in her liver function test between 70 and 100.  I do not want the patient taking Tylenol. I also cautioned her against daily use of NSAIDs due to increased risk of cardiovascular disease. I am fine with her using ibuprofen sparingly for headache and joint pain.  I also gave the patient tramadol 50 mg every 8 hours when necessary severe joint pain. She can use this at night to help her sleep.  Recheck diabetes tests in 3 months along with a fasting lipid panel.  07/17/13 Patient is here today for follow up.  Since discharge from the hospital she is complaining of severe fatigue. She is also having a terrible daily headache which she attributes to the isosorbide.  Fortunately, she denies any chest pain or angina. Unfortunately, her quality of life has been drastically reduced by her severe fatigue. Her TSH in the hospital was slightly elevated at greater than 6. She was not anemic in the hospital. She denies any melena  or hematochezia. She denies any depression. She does complain of some insomnia. She states that she is unable to fall asleep at night. She states that her mind "just wanders" and she is unable to go to sleep.   Past Medical History  Diagnosis Date  . Abnormal weight gain   . Hyperlipidemia   . GERD (gastroesophageal reflux disease)   . Hypertension   . Diverticulosis   . History of stress test     a. 09/2006 nl Dobutamine Echo  . Tracheobronchomalacia     a. followed by [facility].  Marland Kitchen COPD (chronic obstructive pulmonary disease)   . On home oxygen therapy     "2L q hs; runs into my BIPAP" (06/13/2013)  . OSA (obstructive sleep apnea)     "BIPAP w/O2" (06/13/2013)  . NIDDM (non-insulin dependent diabetes mellitus)   . Arthritis     "joints" (06/13/2013)   Current Outpatient Prescriptions on File Prior to Visit  Medication Sig Dispense Refill  . aspirin 81 MG tablet Take 81 mg by mouth daily.        . Calcium Carbonate-Vitamin D (CALCIUM 600 + D PO) Take 1 tablet by mouth daily.        Marland Kitchen gabapentin (NEURONTIN) 600 MG tablet Take 600 mg by mouth 4 (four) times daily.      Marland Kitchen glipiZIDE (GLUCOTROL XL) 5 MG 24 hr tablet Take 5 mg by mouth at bedtime.      . hydrochlorothiazide (MICROZIDE) 12.5 MG capsule Take 12.5  mg by mouth daily.      . isosorbide mononitrate (IMDUR) 30 MG 24 hr tablet Take 1 tablet (30 mg total) by mouth daily.  30 tablet  11  . metFORMIN (GLUCOPHAGE) 500 MG tablet Take 2 tablets (1,000 mg total) by mouth 2 (two) times daily with a meal. HOLD for 48 hours, restart on 05/07      . metoprolol tartrate (LOPRESSOR) 25 MG tablet Take 1 tablet (25 mg total) by mouth 2 (two) times daily.  60 tablet  11  . Multiple Vitamin (MULTIVITAMIN) capsule Take 1 capsule by mouth daily.        . nitroGLYCERIN (NITROSTAT) 0.4 MG SL tablet Place 1 tablet (0.4 mg total) under the tongue every 5 (five) minutes x 3 doses as needed for chest pain.  25 tablet  12  . Omega-3 Fatty Acids (FISH OIL)  1000 MG CAPS Take 1 capsule by mouth daily.        . simvastatin (ZOCOR) 20 MG tablet Take 1 tablet (20 mg total) by mouth at bedtime.  90 tablet  3  . traMADol (ULTRAM) 50 MG tablet Take 1 tablet (50 mg total) by mouth every 8 (eight) hours as needed.  60 tablet  0   No current facility-administered medications on file prior to visit.   No Known Allergies History   Social History  . Marital Status: Married    Spouse Name: N/A    Number of Children: 3  . Years of Education: N/A   Occupational History  . Systems analyst    Social History Main Topics  . Smoking status: Former Smoker -- 1.50 packs/day for 20 years    Types: Cigarettes    Quit date: 02/13/1986  . Smokeless tobacco: Never Used  . Alcohol Use: No  . Drug Use: No  . Sexual Activity: Yes   Other Topics Concern  . 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      Review of Systems  All other systems reviewed and are negative.      Objective:   Physical Exam  Vitals reviewed. Constitutional: She is oriented to person, place, and time. She appears well-developed and well-nourished. No distress.  Neck: Neck supple. No JVD present. No thyromegaly present.  Cardiovascular: Normal rate, regular rhythm, normal heart sounds and intact distal pulses.  Exam reveals no gallop and no friction rub.   No murmur heard. Pulmonary/Chest: Effort normal and breath sounds normal. No respiratory distress. She has no wheezes. She has no rales.  Abdominal: Soft. Bowel sounds are normal. She exhibits no distension. There is no tenderness. There is no rebound and no guarding.  Musculoskeletal: She exhibits no edema.  Lymphadenopathy:    She has no cervical adenopathy.  Neurological: She is alert and oriented to person, place, and time. She has normal reflexes. No cranial nerve deficit.  Skin: She is not diaphoretic.          Assessment & Plan:  1. Other  malaise and fatigue Patient denies depression. With fatigue is likely due to the beta blocker. I will check the patient for hypothyroidism, vitamin B12 deficiency, and anemia. Meanwhile I asked the patient to reduce metoprolol to 12.5 mg by mouth twice a day.  I also asked her to monitor blood pressure closely at home and notify me of the results in one week. I have sent a message to her cardiologist about possibly switching the  patient from isosorbide to ranexa to see if we can mitigate her headache as this seems to be affecting her quality of life. I will defer to his judgment due to my lack of experience with Ranexa.  Fortunately the antianginal agents seems to be working. - CBC with Differential - COMPLETE METABOLIC PANEL WITH GFR - TSH - Vitamin B12 - T4, free  2. Insomnia - ALPRAZolam (XANAX) 0.5 MG tablet; Take 1 tablet (0.5 mg total) by mouth at bedtime as needed for anxiety or sleep.  Dispense: 30 tablet; Refill: 0

## 2013-07-18 LAB — COMPLETE METABOLIC PANEL WITH GFR
ALK PHOS: 48 U/L (ref 39–117)
ALT: 76 U/L — ABNORMAL HIGH (ref 0–35)
AST: 65 U/L — ABNORMAL HIGH (ref 0–37)
Albumin: 4.1 g/dL (ref 3.5–5.2)
BILIRUBIN TOTAL: 0.3 mg/dL (ref 0.2–1.2)
BUN: 21 mg/dL (ref 6–23)
CO2: 31 meq/L (ref 19–32)
Calcium: 9.6 mg/dL (ref 8.4–10.5)
Chloride: 98 mEq/L (ref 96–112)
Creat: 0.62 mg/dL (ref 0.50–1.10)
Glucose, Bld: 130 mg/dL — ABNORMAL HIGH (ref 70–99)
Potassium: 4.5 mEq/L (ref 3.5–5.3)
SODIUM: 139 meq/L (ref 135–145)
Total Protein: 6.8 g/dL (ref 6.0–8.3)

## 2013-07-18 LAB — CBC WITH DIFFERENTIAL/PLATELET
BASOS PCT: 0 % (ref 0–1)
Basophils Absolute: 0 10*3/uL (ref 0.0–0.1)
EOS ABS: 0.3 10*3/uL (ref 0.0–0.7)
Eosinophils Relative: 3 % (ref 0–5)
HCT: 42.2 % (ref 36.0–46.0)
HEMOGLOBIN: 13.9 g/dL (ref 12.0–15.0)
Lymphocytes Relative: 37 % (ref 12–46)
Lymphs Abs: 3.1 10*3/uL (ref 0.7–4.0)
MCH: 28.6 pg (ref 26.0–34.0)
MCHC: 32.9 g/dL (ref 30.0–36.0)
MCV: 86.8 fL (ref 78.0–100.0)
Monocytes Absolute: 0.6 10*3/uL (ref 0.1–1.0)
Monocytes Relative: 7 % (ref 3–12)
NEUTROS PCT: 53 % (ref 43–77)
Neutro Abs: 4.5 10*3/uL (ref 1.7–7.7)
Platelets: 258 10*3/uL (ref 150–400)
RBC: 4.86 MIL/uL (ref 3.87–5.11)
RDW: 13.9 % (ref 11.5–15.5)
WBC: 8.4 10*3/uL (ref 4.0–10.5)

## 2013-07-18 LAB — TSH: TSH: 2.134 u[IU]/mL (ref 0.350–4.500)

## 2013-07-18 LAB — T4, FREE: Free T4: 1.08 ng/dL (ref 0.80–1.80)

## 2013-07-18 LAB — VITAMIN B12: Vitamin B-12: 697 pg/mL (ref 211–911)

## 2013-07-21 ENCOUNTER — Other Ambulatory Visit: Payer: Self-pay | Admitting: Family Medicine

## 2013-07-25 ENCOUNTER — Other Ambulatory Visit: Payer: Self-pay | Admitting: Family Medicine

## 2013-07-25 DIAGNOSIS — Z1211 Encounter for screening for malignant neoplasm of colon: Secondary | ICD-10-CM

## 2013-07-25 DIAGNOSIS — Z1231 Encounter for screening mammogram for malignant neoplasm of breast: Secondary | ICD-10-CM

## 2013-07-25 DIAGNOSIS — K219 Gastro-esophageal reflux disease without esophagitis: Secondary | ICD-10-CM

## 2013-07-28 ENCOUNTER — Encounter: Payer: Self-pay | Admitting: *Deleted

## 2013-09-02 ENCOUNTER — Other Ambulatory Visit: Payer: Medicare Other

## 2013-09-02 ENCOUNTER — Ambulatory Visit: Payer: Medicare Other

## 2013-09-03 ENCOUNTER — Encounter: Payer: Self-pay | Admitting: Pulmonary Disease

## 2013-09-03 ENCOUNTER — Ambulatory Visit (INDEPENDENT_AMBULATORY_CARE_PROVIDER_SITE_OTHER): Payer: Medicare Other | Admitting: Pulmonary Disease

## 2013-09-03 VITALS — BP 124/78 | HR 77 | Ht 61.0 in | Wt 170.4 lb

## 2013-09-03 DIAGNOSIS — J988 Other specified respiratory disorders: Secondary | ICD-10-CM

## 2013-09-03 DIAGNOSIS — G4733 Obstructive sleep apnea (adult) (pediatric): Secondary | ICD-10-CM

## 2013-09-03 DIAGNOSIS — J398 Other specified diseases of upper respiratory tract: Secondary | ICD-10-CM

## 2013-09-03 NOTE — Progress Notes (Signed)
   Subjective:    Patient ID: April [name], female    DOB: 1940/07/22, 73 y.o.   MRN: [id number]  HPI  Primary Provider: Dr. Jenna [name], [facility]   73 year old former -smoker with an extensive workup for dyspnea which was finally attributed to tracheobroncho- malacia. Improved with BiPAP. She also has large Hiatal hernia.   Significant tests/ events :  HRCT 2/09 & 4/10 did not show any evidence of pulmonary fibrosis . Large hiatal hernia was noted.  PFT showed intraparenchymal restriction with a diffuse capacity of 49%.PFTs 8/09 >> marked improvment, FEV1% was 78, FEV68 %, FVC 63%, TLC 67%, DLCO remains 51%.  A sleep study did reveal mild obstructive sleep apnea. On BiPAP +10/5 & is able to sleep supine.  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  Work exposure to chemicals - was outlined for her school   Feb, 2012 - Hoarseness resolved - lisinopril stopped, neg ENt evaluation April [name]) Does have occasional GERD.  06/17/2010 -ONO on bipap/ RA desaturation for about 6 mins -   08/22/2012 spirometry >> FVC 75% , mild restriction - improved slightly 09/03/2013  Chief Complaint  Patient presents with  . Follow-up    Pt now has a heart blockage. Stent was not able to be placed. Reports good and bad days with breathing. Humid days are her worse. Slight dry cough. No wheezing, no chest tx. Still uses BIPAP w/ 2 liters O2 everynight.     Oxygen was dc'd 2013 & she has done well without it  pt reports has been okay as long as she does not over exert herself. Continues to have some DOE. She required cardiac cath, left circumflex lesion could not be stented Orthopnea persists -Pt uses BIPAP and O2 everynight. Denies any problems with machine. She is able to spend a few nights without oxygen which he travels Does not desaturate on exertion        Review of Systems neg for  any significant sore throat, dysphagia, itching, sneezing, nasal congestion or excess/ purulent secretions, fever, chills, sweats, unintended wt loss, pleuritic or exertional cp, hempoptysis, orthopnea pnd or change in chronic leg swelling. Also denies presyncope, palpitations, heartburn, abdominal pain, nausea, vomiting, diarrhea or change in bowel or urinary habits, dysuria,hematuria, rash, arthralgias, visual complaints, headache, numbness weakness or ataxia.     Objective:   Physical Exam  Gen. Pleasant, well-nourished, in no distress ENT - no lesions, no post nasal drip Neck: No JVD, no thyromegaly, no carotid bruits Lungs: no use of accessory muscles, no dullness to percussion, clear without rales or rhonchi  Cardiovascular: Rhythm regular, heart sounds  normal, no murmurs or gallops, no peripheral edema Musculoskeletal: No deformities, no cyanosis or clubbing        Assessment & Plan:

## 2013-09-03 NOTE — Assessment & Plan Note (Signed)
We will check your oxygen levels during sleep (on bipap) & decide whether you still need oxygen Ct bipap during sleep

## 2013-09-03 NOTE — Patient Instructions (Signed)
We will check your oxygen levels during sleep (on bipap) & decide whether you still need oxygen

## 2013-09-08 ENCOUNTER — Ambulatory Visit (INDEPENDENT_AMBULATORY_CARE_PROVIDER_SITE_OTHER): Payer: Medicare Other | Admitting: Cardiology

## 2013-09-08 ENCOUNTER — Other Ambulatory Visit: Payer: Self-pay | Admitting: Family Medicine

## 2013-09-08 ENCOUNTER — Encounter: Payer: Self-pay | Admitting: Cardiology

## 2013-09-08 VITALS — BP 125/71 | HR 69 | Ht 61.0 in | Wt 171.2 lb

## 2013-09-08 DIAGNOSIS — I10 Essential (primary) hypertension: Secondary | ICD-10-CM

## 2013-09-08 DIAGNOSIS — E785 Hyperlipidemia, unspecified: Secondary | ICD-10-CM

## 2013-09-08 DIAGNOSIS — I2510 Atherosclerotic heart disease of native coronary artery without angina pectoris: Secondary | ICD-10-CM

## 2013-09-08 DIAGNOSIS — I200 Unstable angina: Secondary | ICD-10-CM

## 2013-09-08 DIAGNOSIS — E119 Type 2 diabetes mellitus without complications: Secondary | ICD-10-CM

## 2013-09-08 DIAGNOSIS — E669 Obesity, unspecified: Secondary | ICD-10-CM

## 2013-09-08 NOTE — Patient Instructions (Signed)
Continue with current  Medication.  Your physician wants you to follow-up in Jan 2016 Dr Ellyn Hack.  You will receive a reminder letter in the mail two months in advance. If you don't receive a letter, please call our office to schedule the follow-up appointment.

## 2013-09-10 ENCOUNTER — Encounter: Payer: Self-pay | Admitting: Cardiology

## 2013-09-10 DIAGNOSIS — E669 Obesity, unspecified: Secondary | ICD-10-CM | POA: Insufficient documentation

## 2013-09-10 NOTE — Assessment & Plan Note (Signed)
Will control on HCTZ add low dose plus low-dose metoprolol.

## 2013-09-10 NOTE — Assessment & Plan Note (Signed)
Back on her statin. Monitored by PCP.

## 2013-09-10 NOTE — Progress Notes (Signed)
PATIENTJylian Pappalardo [name] MRN: [id number]  DOB: [birth year]   DOV:09/10/2013 PCP: Odette Fraction, MD  Clinic Note: Chief Complaint  Patient presents with  . Follow-up    2 months follow-up    HPI: April [name] is a 73 y.o.  female with a PMH below who presents today for two-month followup visit. She saw Ellen Henri, PA-C. in mid May as followup from her hospitalization where she underwent cardiac catheterization revealing single vessel severe disease in extremely tortuous dominant circumflex that was not thought to be safe PCI. I reviewed the images with Dr. Burt Knack we both felt that it would be unsafe to attempt PCI on this lesion. The plan was for medical therapy and she has done relatively well. She does have baseline COPD which limits her activity level at baseline anyway with underlying dyspnea. She was started on beta blocker and Imdur. Her primary reduce the beta blocker from 25 twice a day to 12.5 twice a day because of headache.  Interval History: She presents today doing quite well without any major complaints. She has no more dyspnea than her baseline dyspnea to no symptoms consistent with a chest discomfort that led to her admission and catheterization in May.  She denies any resting or exertional chest tightness or pressure. She does have exertional dyspnea but is no change from baseline. She has been more conscious of her need to try to exercise, and has been doing more walking -- with doing so she denies any of the chest pressure she noted before.Marland Kitchen No PND, orthopnea with only occasional mild edema. No rapid or irregular heartbeat/palpitations or rhythms. No syncope/near syncope or TIA/amaurosis fugax. No melena, hematochezia, hematuria, or epistaxis. No claudication symptoms.  Past Medical History  Diagnosis Date  . Abnormal weight gain   . Hyperlipidemia   . GERD (gastroesophageal reflux disease)   . Hypertension   . Diverticulosis   . History of stress test     a.  09/2006 nl Dobutamine Echo  . Tracheobronchomalacia     a. followed by [facility]  Marland Kitchen COPD (chronic obstructive pulmonary disease)   . On home oxygen therapy     "2L q hs; runs into my BIPAP" (06/13/2013)  . OSA (obstructive sleep apnea)     "BIPAP w/O2" (06/13/2013)  . NIDDM (non-insulin dependent diabetes mellitus)   . Arthritis     "joints" (06/13/2013)  . Coronary artery disease, non-occlusive 06/2013    Cardiac Cath: sharp Angle take-off of Large Dominant Cx: ~70-80% mid Cx bifurcation lesion (Lateral OM &  AVGCx-PL-PDA both with hairpin ostial & ~50% lesions) - Not PCI amenable due to vessel tortuosity; ~40-50% mid LAD;Ramus - no significnat diseaes; small non-dominant RCA    Prior Cardiac Evaluation and Past Surgical History: Past Surgical History  Procedure Laterality Date  . Tubal ligation    . Cardiac catheterization  06/2013    Cardiac Cath: sharp Angle take-off of Large Dominant Cx: ~70-80% mid Cx bifurcation lesion (Lateral OM &  AVGCx-PL-PDA both with hairpin ostial & ~50% lesions) - Not PCI amenable due to vessel tortuosity; ~40-50% mid LAD;Ramus - no significnat diseaes; small non-dominant RCA  . Transthoracic echocardiogram  06/17/2013    Normal LV size and function. EF 55-60% with no regional WMA. Grade 1 diastolic dysfunction. No significant valvular lesions    No Known Allergies  Current Outpatient Prescriptions  Medication Sig Dispense Refill  . ALPRAZolam (XANAX) 0.5 MG tablet Take 1 tablet (0.5 mg total) by mouth at bedtime  as needed for anxiety or sleep.  30 tablet  0  . aspirin 81 MG tablet Take 81 mg by mouth daily.        . Calcium Carbonate-Vitamin D (CALCIUM 600 + D PO) Take 1 tablet by mouth daily.        Marland Kitchen gabapentin (NEURONTIN) 600 MG tablet Take 600 mg by mouth 4 (four) times daily.      Marland Kitchen glipiZIDE (GLUCOTROL XL) 5 MG 24 hr tablet TAKE 1 TABLET BY MOUTH DAILY  30 tablet  5  . hydrochlorothiazide (MICROZIDE) 12.5 MG capsule Take 12.5 mg by mouth daily.        . isosorbide mononitrate (IMDUR) 30 MG 24 hr tablet Take 1 tablet (30 mg total) by mouth daily.  30 tablet  11  . metFORMIN (GLUCOPHAGE) 500 MG tablet Take 1,000 mg by mouth 2 (two) times daily with a meal.      . metoprolol tartrate (LOPRESSOR) 25 MG tablet Take 1 tablet (25 mg total) by mouth 2 (two) times daily.  60 tablet  11  . Multiple Vitamin (MULTIVITAMIN) capsule Take 1 capsule by mouth daily.        . nitroGLYCERIN (NITROSTAT) 0.4 MG SL tablet Place 1 tablet (0.4 mg total) under the tongue every 5 (five) minutes x 3 doses as needed for chest pain.  25 tablet  12  . Omega-3 Fatty Acids (FISH OIL) 1000 MG CAPS Take 1 capsule by mouth daily.        . simvastatin (ZOCOR) 20 MG tablet Take 1 tablet (20 mg total) by mouth at bedtime.  90 tablet  3  . traMADol (ULTRAM) 50 MG tablet Take 1 tablet (50 mg total) by mouth every 8 (eight) hours as needed.  60 tablet  0   No current facility-administered medications for this visit.    History   Social History Narrative   Lives in [place] with her husband. 3 children.  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   Former 30 Pk/yr Smoker (1.5 PPD x 20 yr) - quit in 1998.    ROS: A comprehensive Review of Systems was performed. Review of Systems  Constitutional: Negative for fever, chills and malaise/fatigue.  HENT: Negative for nosebleeds.        Headaches have improved  Eyes: Negative for blurred vision and double vision.  Respiratory: Positive for cough.        Chronic nagging cough - non-productive. Occasional wheezing  Cardiovascular: Negative for chest pain, palpitations, orthopnea, claudication, leg swelling and PND.  Gastrointestinal: Negative for heartburn, nausea, vomiting, abdominal pain, blood in stool and melena.  Genitourinary: Negative.   Musculoskeletal: Positive for joint pain. Negative for falls.  Neurological: Positive for loss of consciousness. Negative for dizziness, tingling, speech change, focal weakness and weakness.   Endo/Heme/Allergies: Does not bruise/bleed easily.  Psychiatric/Behavioral: Negative.   All other systems reviewed and are negative.  PHYSICAL EXAM Wt Readings from Last 3 Encounters:  09/08/13 171 lb 3.2 oz (77.656 kg)  09/03/13 170 lb 6.4 oz (77.293 kg)  07/17/13 170 lb (77.111 kg)   BP 125/71  Pulse 69  Ht 5\' 1"  (1.549 m)  Wt 171 lb 3.2 oz (77.656 kg)  BMI 32.36 kg/m2 General appearance: alert, cooperative, appears stated age, no distress, mildly obese and Pleasant mood and affect.  Neck: no adenopathy, no carotid bruit, no JVD and supple, symmetrical, trachea midline Lungs: Non-labored with mild diffuse crackles that clear with cough. No significant wheezing or rales. She does have prolonged expiratory  phase Heart: regular rate and rhythm, S1, S2 normal, no murmur, click, rub or gallop and normal apical impulse Abdomen: soft, non-tender; bowel sounds normal; no masses,  no organomegaly Extremities: extremities normal, atraumatic, no cyanosis or edema Pulses: 2+ and symmetric Neurologic: Grossly normal   Adult ECG Report - not performed  ASSESSMENT / PLAN: Atherosclerotic heart disease of native coronary artery without angina pectoris She does have significant lesion in the bifurcation of her dominant circumflex, but has not had any recurrent anginal type symptoms on current regimen. Her blood pressure and heart rate looked good and she is tolerating the low dose of Lopressor well. Hopefully when I see her back in 6 months we can probably increase it to 25 twice a day. Continue Imdur. I think the Imdur was more also her headache and her metoprolol.  Continue Imdur and current dose of beta blocker  Continue with risk factor modification with statin and taking aspirin.  Continue to stay active with exercise in order to monitor recurrence of symptoms.   When necessary nitroglycerin.  Essential hypertension Will control on HCTZ add low dose plus low-dose  metoprolol.  Unstable angina - history of No recurrent symptoms as noted above.  Obesity (BMI 30-39.9) Discussed importance of continued exercise and dietary modification. No weight change since last visit. Hopefully as her activity level increases she will lose weight.  Hyperlipidemia with target LDL less than 70 Back on her statin. Monitored by PCP.  Type 2 diabetes mellitus treated without insulin On oral medications, monitor by PCP.   No orders of the defined types were placed in this encounter.    Followup: 6 months  Lucresha Dismuke W. Ellyn Hack, M.D., M.S. Interventional Cardiology [facility]

## 2013-09-10 NOTE — Assessment & Plan Note (Signed)
No recurrent symptoms as noted above.

## 2013-09-10 NOTE — Assessment & Plan Note (Signed)
She does have significant lesion in the bifurcation of her dominant circumflex, but has not had any recurrent anginal type symptoms on current regimen. Her blood pressure and heart rate looked good and she is tolerating the low dose of Lopressor well. Hopefully when I see her back in 6 months we can probably increase it to 25 twice a day. Continue Imdur. I think the Imdur was more also her headache and her metoprolol.  Continue Imdur and current dose of beta blocker  Continue with risk factor modification with statin and taking aspirin.  Continue to stay active with exercise in order to monitor recurrence of symptoms.   When necessary nitroglycerin.

## 2013-09-10 NOTE — Assessment & Plan Note (Addendum)
Discussed importance of continued exercise and dietary modification. No weight change since last visit. Hopefully as her activity level increases she will lose weight.

## 2013-09-10 NOTE — Assessment & Plan Note (Signed)
On oral medications, monitor by PCP.

## 2013-09-11 ENCOUNTER — Telehealth: Payer: Self-pay | Admitting: Pulmonary Disease

## 2013-09-11 NOTE — Telephone Encounter (Signed)
I spoke with patient about results and she verbalized understanding and had no questions 

## 2013-09-11 NOTE — Telephone Encounter (Signed)
ONO on BiPAP and room air showed 52 minute desaturation. OK to stay off oxygen, and report back in a few weeks.

## 2013-09-19 ENCOUNTER — Ambulatory Visit
Admission: RE | Admit: 2013-09-19 | Discharge: 2013-09-19 | Disposition: A | Discharge status: Home / Self Care | Payer: Medicare Other | Source: Ambulatory Visit | Attending: Family Medicine | Admitting: Family Medicine

## 2013-09-19 DIAGNOSIS — K219 Gastro-esophageal reflux disease without esophagitis: Secondary | ICD-10-CM

## 2013-09-19 DIAGNOSIS — Z1231 Encounter for screening mammogram for malignant neoplasm of breast: Secondary | ICD-10-CM

## 2013-09-26 ENCOUNTER — Encounter: Payer: Self-pay | Admitting: Pulmonary Disease

## 2013-11-10 ENCOUNTER — Other Ambulatory Visit: Payer: Self-pay | Admitting: Family Medicine

## 2013-11-10 NOTE — Telephone Encounter (Signed)
Ok to refill Tramadol??  Last office visit 07/17/2013.  Last refill 06/23/2013.

## 2013-11-10 NOTE — Telephone Encounter (Signed)
Medication called to pharmacy. 

## 2013-11-10 NOTE — Telephone Encounter (Signed)
ok 

## 2013-12-16 ENCOUNTER — Other Ambulatory Visit: Payer: Medicare Other

## 2013-12-16 ENCOUNTER — Other Ambulatory Visit: Payer: Self-pay | Admitting: Family Medicine

## 2013-12-16 DIAGNOSIS — E119 Type 2 diabetes mellitus without complications: Secondary | ICD-10-CM

## 2013-12-16 DIAGNOSIS — E669 Obesity, unspecified: Secondary | ICD-10-CM

## 2013-12-16 DIAGNOSIS — I10 Essential (primary) hypertension: Secondary | ICD-10-CM

## 2013-12-16 DIAGNOSIS — E785 Hyperlipidemia, unspecified: Secondary | ICD-10-CM

## 2013-12-16 DIAGNOSIS — Z79899 Other long term (current) drug therapy: Secondary | ICD-10-CM

## 2013-12-16 LAB — COMPLETE METABOLIC PANEL WITH GFR
ALT: 50 U/L — ABNORMAL HIGH (ref 0–35)
AST: 51 U/L — AB (ref 0–37)
Albumin: 4.2 g/dL (ref 3.5–5.2)
Alkaline Phosphatase: 49 U/L (ref 39–117)
BUN: 15 mg/dL (ref 6–23)
CO2: 35 mEq/L — ABNORMAL HIGH (ref 19–32)
CREATININE: 0.61 mg/dL (ref 0.50–1.10)
Calcium: 9.3 mg/dL (ref 8.4–10.5)
Chloride: 99 mEq/L (ref 96–112)
GFR, Est African American: >89 mL/min
GFR, Est Non African American: >89 mL/min
Glucose, Bld: 139 mg/dL — ABNORMAL HIGH (ref 70–99)
POTASSIUM: 5 meq/L (ref 3.5–5.3)
Sodium: 139 mEq/L (ref 135–145)
TOTAL PROTEIN: 6.5 g/dL (ref 6.0–8.3)
Total Bilirubin: 0.5 mg/dL (ref 0.2–1.2)

## 2013-12-16 LAB — HEMOGLOBIN A1C
HEMOGLOBIN A1C: 7.1 % — AB (ref <5.7)
Mean Plasma Glucose: 157 mg/dL — ABNORMAL HIGH (ref <117)

## 2013-12-16 LAB — LIPID PANEL
Cholesterol: 165 mg/dL (ref 0–200)
HDL: 46 mg/dL (ref >39)
LDL CALC: 72 mg/dL (ref 0–99)
TRIGLYCERIDES: 235 mg/dL — AB (ref <150)
Total CHOL/HDL Ratio: 3.6 Ratio
VLDL: 47 mg/dL — AB (ref 0–40)

## 2013-12-22 ENCOUNTER — Ambulatory Visit (INDEPENDENT_AMBULATORY_CARE_PROVIDER_SITE_OTHER): Payer: Medicare Other | Admitting: Family Medicine

## 2013-12-22 ENCOUNTER — Encounter: Payer: Self-pay | Admitting: Family Medicine

## 2013-12-22 VITALS — BP 130/74 | HR 74 | Temp 97.9°F | Resp 16 | Ht 62.0 in | Wt 170.0 lb

## 2013-12-22 DIAGNOSIS — I10 Essential (primary) hypertension: Secondary | ICD-10-CM

## 2013-12-22 DIAGNOSIS — E1140 Type 2 diabetes mellitus with diabetic neuropathy, unspecified: Secondary | ICD-10-CM

## 2013-12-22 DIAGNOSIS — Z23 Encounter for immunization: Secondary | ICD-10-CM

## 2013-12-22 DIAGNOSIS — E785 Hyperlipidemia, unspecified: Secondary | ICD-10-CM

## 2013-12-22 NOTE — Addendum Note (Signed)
Addended by: Shary Decamp B on: 12/22/2013 12:38 PM   Modules accepted: Orders

## 2013-12-22 NOTE — Progress Notes (Signed)
 Subjective:    Patient ID: April [name], female    DOB: 07/19/1940, 73 y.o.   MRN: [id number]  HPI Patient is a very pleasant 73-year-old white female with a history of hypertension, hyperlipidemia/dyslipidemia, coronary artery disease, and diabetes mellitus type 2. She also has a history of peripheral neuropathy. She continues to have numbness and tingling in both feet. She has occasional neuropathic pain which for which she takes gabapentin. She does complain of diffuse myalgias. She denies any right upper quadrant pain. She denies any chest pain. She denies any shortness of breath or dyspnea on exertion. She denies any episodes of hypoglycemia. She denies any blurred vision, polyuria, polydipsia. Her diabetic foot exam is performed today. Her diabetic eye exam is up-to-date. Her mammogram is up-to-date. She has had Prevnar 13. She is due for a booster on Pneumovax 23.. Past Medical History  Diagnosis Date  . Abnormal weight gain   . Hyperlipidemia   . GERD (gastroesophageal reflux disease)   . Hypertension   . Diverticulosis   . History of stress test     a. 09/2006 nl Dobutamine Echo  . Tracheobronchomalacia     a. followed by [facility]  . COPD (chronic obstructive pulmonary disease)   . On home oxygen therapy     "2L q hs; runs into my BIPAP" (06/13/2013)  . OSA (obstructive sleep apnea)     "BIPAP w/O2" (06/13/2013)  . NIDDM (non-insulin dependent diabetes mellitus)   . Arthritis     "joints" (06/13/2013)  . Coronary artery disease, non-occlusive 06/2013    Cardiac Cath: sharp Angle take-off of Large Dominant Cx: ~70-80% mid Cx bifurcation lesion (Lateral OM &  AVGCx-PL-PDA both with hairpin ostial & ~50% lesions) - Not PCI amenable due to vessel tortuosity; ~40-50% mid LAD;Ramus - no significnat diseaes; small non-dominant RCA   Past Surgical History  Procedure Laterality Date  . Tubal ligation    . Cardiac catheterization  06/2013    Cardiac Cath: sharp Angle take-off of Large  Dominant Cx: ~70-80% mid Cx bifurcation lesion (Lateral OM &  AVGCx-PL-PDA both with hairpin ostial & ~50% lesions) - Not PCI amenable due to vessel tortuosity; ~40-50% mid LAD;Ramus - no significnat diseaes; small non-dominant RCA  . Transthoracic echocardiogram  06/17/2013    Normal LV size and function. EF 55-60% with no regional WMA. Grade 1 diastolic dysfunction. No significant valvular lesions   Current Outpatient Prescriptions on File Prior to Visit  Medication Sig Dispense Refill  . aspirin 81 MG tablet Take 81 mg by mouth daily.      . Calcium Carbonate-Vitamin D (CALCIUM 600 + D PO) Take 1 tablet by mouth daily.      . gabapentin (NEURONTIN) 600 MG tablet Take 600 mg by mouth 4 (four) times daily.    . glipiZIDE (GLUCOTROL XL) 5 MG 24 hr tablet TAKE 1 TABLET BY MOUTH DAILY 30 tablet 5  . hydrochlorothiazide (MICROZIDE) 12.5 MG capsule TAKE 1 CAPSULE BY MOUTH DAILY 30 capsule 5  . isosorbide mononitrate (IMDUR) 30 MG 24 hr tablet Take 1 tablet (30 mg total) by mouth daily. 30 tablet 11  . metFORMIN (GLUCOPHAGE) 500 MG tablet Take 1,000 mg by mouth 2 (two) times daily with a meal.    . metoprolol tartrate (LOPRESSOR) 25 MG tablet Take 1 tablet (25 mg total) by mouth 2 (two) times daily. 60 tablet 11  . Multiple Vitamin (MULTIVITAMIN) capsule Take 1 capsule by mouth daily.      .   nitroGLYCERIN (NITROSTAT) 0.4 MG SL tablet Place 1 tablet (0.4 mg total) under the tongue every 5 (five) minutes x 3 doses as needed for chest pain. 25 tablet 12  . Omega-3 Fatty Acids (FISH OIL) 1000 MG CAPS Take 1 capsule by mouth daily.      . simvastatin (ZOCOR) 20 MG tablet Take 1 tablet (20 mg total) by mouth at bedtime. 90 tablet 3   No current facility-administered medications on file prior to visit.   Allergies  Allergen Reactions  . Neomycin Other (See Comments)    Visual disturbance and swelling in eyes   History   Social History  . Marital Status: Married    Spouse Name: N/A    Number of  Children: 3  . Years of Education: N/A   Occupational History  . Systems analyst    Social History Main Topics  . Smoking status: Former Smoker -- 1.50 packs/day for 20 years    Types: Cigarettes    Quit date: 02/13/1986  . Smokeless tobacco: Never Used  . Alcohol Use: No  . Drug Use: No  . Sexual Activity: Yes   Other Topics Concern  . Not on file   Social History Narrative   Lives in [place] with her husband. 3 children.  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   Former 30 Pk/yr Smoker (1.5 PPD x 20 yr) - quit in 1998.      Review of Systems  All other systems reviewed and are negative.      Objective:   Physical Exam  Constitutional: She appears well-developed and well-nourished.  HENT:  Right Ear: External ear normal.  Left Ear: External ear normal.  Nose: Nose normal.  Mouth/Throat: Oropharynx is clear and moist. No oropharyngeal exudate.  Eyes: Conjunctivae are normal. No scleral icterus.  Neck: Neck supple. No JVD present. No thyromegaly present.  Cardiovascular: Normal rate, regular rhythm, normal heart sounds and intact distal pulses.   No murmur heard. Pulmonary/Chest: Effort normal and breath sounds normal. No respiratory distress. She has no wheezes. She has no rales. She exhibits no tenderness.  Abdominal: Soft. Bowel sounds are normal. She exhibits no distension. There is no tenderness. There is no rebound.  Musculoskeletal: She exhibits no edema.  Lymphadenopathy:    She has no cervical adenopathy.  Vitals reviewed.  Appointment on 12/16/2013  Component Date Value Ref Range Status  . Hgb A1c MFr Bld 12/16/2013 7.1* <5.7 % Final   Comment:                                                                        According to the ADA Clinical Practice Recommendations for 2011, when HbA1c is used as a screening test:     >=6.5%   Diagnostic of Diabetes Mellitus            (if abnormal result is confirmed)   5.7-6.4%   Increased risk of developing Diabetes  [place]   References:Diagnosis and Classification of Diabetes Mellitus,Diabetes QIHK,7425,95(GLOVF 1):S62-S69 and Standards of Medical Care in         Diabetes - 2011,Diabetes IEPP,2951,88 (Suppl 1):S11-S61.     . Mean Plasma Glucose 12/16/2013 157* <117 mg/dL Final  . Cholesterol 12/16/2013 165  0 - 200 mg/dL  Final   Comment: ATP III Classification:       < 200        mg/dL        Desirable      200 - 239     mg/dL        Borderline High      >= 240        mg/dL        High     . Triglycerides 12/16/2013 235* <150 mg/dL Final  . HDL 12/16/2013 46  >39 mg/dL Final  . Total CHOL/HDL Ratio 12/16/2013 3.6   Final  . VLDL 12/16/2013 47* 0 - 40 mg/dL Final  . LDL Cholesterol 12/16/2013 72  0 - 99 mg/dL Final   Comment:   Total Cholesterol/HDL Ratio:CHD Risk                        Coronary Heart Disease Risk Table                                        Men       Women          1/2 Average Risk              3.4        3.3              Average Risk              5.0        4.4           2X Average Risk              9.6        7.1           3X Average Risk             23.4       11.0 Use the calculated Patient Ratio above and the CHD Risk table  to determine the patient's CHD Risk. ATP III Classification (LDL):       < 100        mg/dL         Optimal      100 - 129     mg/dL         Near or Above Optimal      130 - 159     mg/dL         Borderline High      160 - 189     mg/dL         High       > 190        mg/dL         Very High     . Sodium 12/16/2013 139  135 - 145 mEq/L Final  . Potassium 12/16/2013 5.0  3.5 - 5.3 mEq/L Final  . Chloride 12/16/2013 99  96 - 112 mEq/L Final  . CO2 12/16/2013 35* 19 - 32 mEq/L Final  . Glucose, Bld 12/16/2013 139* 70 - 99 mg/dL Final  . BUN 12/16/2013 15  6 - 23 mg/dL Final  . Creat 12/16/2013 0.61  0.50 - 1.10 mg/dL Final  . Total Bilirubin 12/16/2013 0.5  0.2 - 1.2 mg/dL Final  . Alkaline Phosphatase 12/16/2013 49  39 - 117 U/L Final  .    AST 12/16/2013 51* 0 - 37 U/L Final  . ALT 12/16/2013 50* 0 - 35 U/L Final  . Total Protein 12/16/2013 6.5  6.0 - 8.3 g/dL Final  . Albumin 12/16/2013 4.2  3.5 - 5.2 g/dL Final  . Calcium 12/16/2013 9.3  8.4 - 10.5 mg/dL Final  . GFR, Est African American 12/16/2013 >89   Final  . GFR, Est Non African American 12/16/2013 >89   Final   Comment:   The estimated GFR is a calculation valid for adults (>=18 years old) that uses the CKD-EPI algorithm to adjust for age and sex. It is   not to be used for children, pregnant women, hospitalized patients,    patients on dialysis, or with rapidly changing kidney function. According to the NKDEP, eGFR >89 is normal, 60-89 shows mild impairment, 30-59 shows moderate impairment, 15-29 shows severe impairment and <15 is ESRD.            Assessment & Plan:  HLD (hyperlipidemia)  Essential hypertension  Diabetic neuropathy, type II diabetes mellitus  Patient's blood pressure is excellent. I will make no changes in her blood pressure medication. Her hemoglobin A1c is borderline well controlled at 7.1. I have encouraged a low carbohydrate diet in 10-15 pounds of weight loss to help bring her hemoglobin A1c less than 7. Her LDL cholesterol is well within goal. I recommended weight loss to help reduce her triglycerides along with a low carbohydrate diet, low saturated fat diet. Diabetic eye exam and diabetic foot exam are up-to-date. Patient received her flu shot as well as Pneumovax 23. 

## 2014-01-07 ENCOUNTER — Telehealth: Payer: Self-pay | Admitting: *Deleted

## 2014-01-07 MED ORDER — GLIPIZIDE ER 5 MG PO TB24: 5.0000 mg | ORAL_TABLET | Freq: Every day | ORAL | Status: DC

## 2014-01-07 MED ORDER — METFORMIN HCL 500 MG PO TABS: 1000.0000 mg | ORAL_TABLET | Freq: Two times a day (BID) | ORAL | Status: DC

## 2014-01-07 NOTE — Telephone Encounter (Signed)
Begin with xray of hip and knee (left).

## 2014-01-07 NOTE — Telephone Encounter (Signed)
Glipizide ER 5 mg, Metformin HCL 500 mg 90 day supply  Medication refilled

## 2014-01-07 NOTE — Telephone Encounter (Signed)
lmtrc

## 2014-01-07 NOTE — Telephone Encounter (Signed)
Pt called stating that she has been off her cholestrol medication simvastatin for 2 weeks as instructed by pickard, states she is still having joint pain on left side hip and knee. Wants to know what you would like for her to do now. States she did get back on simvastatin last night (01/06/14).   When call pt back please call before 9:15 or after 3pm.

## 2014-01-12 ENCOUNTER — Ambulatory Visit: Payer: Medicare Other | Admitting: Physician Assistant

## 2014-01-12 ENCOUNTER — Encounter (HOSPITAL_COMMUNITY): Payer: Self-pay

## 2014-01-12 ENCOUNTER — Emergency Department (INDEPENDENT_AMBULATORY_CARE_PROVIDER_SITE_OTHER)
Admission: EM | Admit: 2014-01-12 | Discharge: 2014-01-12 | Disposition: A | Discharge status: Home / Self Care | Payer: Medicare Other | Source: Home / Self Care | Attending: Family Medicine | Admitting: Family Medicine

## 2014-01-12 DIAGNOSIS — M25552 Pain in left hip: Secondary | ICD-10-CM

## 2014-01-12 MED ORDER — TRAMADOL HCL 50 MG PO TABS: 50.0000 mg | ORAL_TABLET | Freq: Four times a day (QID) | ORAL | Status: DC | PRN

## 2014-01-12 NOTE — Discharge Instructions (Signed)
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 We will treat you with tramadol for pain. You should call Dr Trevor Mace office to set up an appointment for evaluation of your pain. If you develop swelling, fever, nausea, vomiting, the inability to move your hip due to pain please seek medical attention.   Hip Pain Your hip is the joint between your upper legs and your lower pelvis. The bones, cartilage, tendons, and muscles of your hip joint perform a lot of work each day supporting your body weight and allowing you to move around. Hip pain can range from a minor ache to severe pain in one or both of your hips. Pain may be felt on the inside of the hip joint near the groin, or the outside near the buttocks and upper thigh. You may have swelling or stiffness as well.  HOME CARE INSTRUCTIONS   Take medicines only as directed by your health care provider.  Apply ice to the injured area:  Put ice in a plastic bag.  Place a towel between your skin and the bag.  Leave the ice on for 15-20 minutes at a time, 3-4 times a day.  Keep your leg raised (elevated) when possible to lessen swelling.  Avoid activities that cause pain.  Follow specific exercises as directed by your health care provider.  Sleep with a pillow between your legs on your most comfortable side.  Record how often you have hip pain, the location of the pain, and what it feels like. SEEK MEDICAL CARE IF:   You are unable to put weight on your leg.  Your hip is red or swollen or very tender to touch.  Your pain or swelling continues or worsens after 1 week.  You have increasing difficulty walking.  You have a fever. SEEK IMMEDIATE MEDICAL CARE IF:   You have fallen.  You have a sudden increase in pain and swelling in your hip. MAKE SURE YOU:   Understand these instructions.  Will watch your condition.  Will get help right away if you are not doing well or get worse. Document Released: 07/20/2009  Document Revised: 06/16/2013 Document Reviewed: 09/26/2012 [facility] Patient Information 2015 [place], [place]. This information is not intended to replace advice given to you by your health care provider. Make sure you discuss any questions you have with your health care provider.

## 2014-01-12 NOTE — ED Notes (Signed)
eval by provider only 

## 2014-01-12 NOTE — ED Provider Notes (Signed)
CSN: [id number]     Arrival date & time 01/12/14  1506 History   First MD Initiated Contact with Patient 01/12/14 1558     Chief Complaint  Patient presents with  . Hip Pain   (Consider location/radiation/quality/duration/timing/severity/associated sxs/prior Treatment) Patient is a 73 y.o. female presenting with hip pain. The history is provided by the patient.  Hip Pain This is a new problem. The current episode started more than 2 days ago. The problem occurs daily. The problem has not changed since onset.The symptoms are aggravated by walking and standing. Nothing relieves the symptoms. She has tried a warm compress for the symptoms. The treatment provided no relief.   Patient presents with left hip pain starting one week ago. She denies injury and fall. She does not remember what brought this on the first time it occurred. She notes it feels like the hip is catching. She notes it is a catching pain. It is intermittently worse when she walks, though some times she has no pain with walking. It hurt when standing for a long period of time. Intermittent radiation down the front of her hip and thigh. Denies fevers, chills, nausea, vomiting, and swelling of the hip. She has not taken any medication for this. Can not take tylenol due to fatty liver and cardiologist advised her to not take ibuprofen.   Past Medical History  Diagnosis Date  . Abnormal weight gain   . Hyperlipidemia   . GERD (gastroesophageal reflux disease)   . Hypertension   . Diverticulosis   . History of stress test     a. 09/2006 nl Dobutamine Echo  . Tracheobronchomalacia     a. followed by [facility]  Marland Kitchen COPD (chronic obstructive pulmonary disease)   . On home oxygen therapy     "2L q hs; runs into my BIPAP" (06/13/2013)  . OSA (obstructive sleep apnea)     "BIPAP w/O2" (06/13/2013)  . NIDDM (non-insulin dependent diabetes mellitus)   . Arthritis     "joints" (06/13/2013)  . Coronary artery disease, non-occlusive 06/2013     Cardiac Cath: sharp Angle take-off of Large Dominant Cx: ~70-80% mid Cx bifurcation lesion (Lateral OM &  AVGCx-PL-PDA both with hairpin ostial & ~50% lesions) - Not PCI amenable due to vessel tortuosity; ~40-50% mid LAD;Ramus - no significnat diseaes; small non-dominant RCA   Past Surgical History  Procedure Laterality Date  . Tubal ligation    . Cardiac catheterization  06/2013    Cardiac Cath: sharp Angle take-off of Large Dominant Cx: ~70-80% mid Cx bifurcation lesion (Lateral OM &  AVGCx-PL-PDA both with hairpin ostial & ~50% lesions) - Not PCI amenable due to vessel tortuosity; ~40-50% mid LAD;Ramus - no significnat diseaes; small non-dominant RCA  . Transthoracic echocardiogram  06/17/2013    Normal LV size and function. EF 55-60% with no regional WMA. Grade 1 diastolic dysfunction. No significant valvular lesions   Family History  Problem Relation Age of Onset  . Asthma Maternal Grandmother   . Heart disease Mother     developed coronary dzs in her 48's.  Marland Kitchen Heart disease Maternal Grandmother   . Heart disease Maternal Grandfather   . Clotting disorder Mother   . Diabetes Brother   . Diabetes Paternal Aunt   . CAD Brother     s/p cabg in his 51's  . CAD Brother     s/p cabg in his 61's.   History  Substance Use Topics  . Smoking status: Former Smoker -- 1.50  packs/day for 20 years    Types: Cigarettes    Quit date: 02/13/1986  . Smokeless tobacco: Never Used  . Alcohol Use: No   OB History    No data available     Review of Systems  Constitutional: Negative for fever and chills.  Musculoskeletal: Negative for joint swelling.       Positive for left hip pain  Skin: Negative for rash.    Allergies  Neomycin  Home Medications   Prior to Admission medications   Medication Sig Start Date End Date Taking? Authorizing Provider  aspirin 81 MG tablet Take 81 mg by mouth daily.      Historical Provider, MD  Calcium Carbonate-Vitamin D (CALCIUM 600 + D PO) Take 1  tablet by mouth daily.      Historical Provider, MD  gabapentin (NEURONTIN) 600 MG tablet Take 600 mg by mouth 4 (four) times daily.    Historical Provider, MD  glipiZIDE (GLUCOTROL XL) 5 MG 24 hr tablet Take 1 tablet (5 mg total) by mouth daily. 01/07/14   Susy Frizzle, MD  hydrochlorothiazide (MICROZIDE) 12.5 MG capsule TAKE 1 CAPSULE BY MOUTH DAILY 11/10/13   Susy Frizzle, MD  isosorbide mononitrate (IMDUR) 30 MG 24 hr tablet Take 1 tablet (30 mg total) by mouth daily. 06/17/13   Evelene Croon Barrett, PA-C  metFORMIN (GLUCOPHAGE) 500 MG tablet Take 2 tablets (1,000 mg total) by mouth 2 (two) times daily with a meal. 01/07/14   Susy Frizzle, MD  metoprolol tartrate (LOPRESSOR) 25 MG tablet Take 1 tablet (25 mg total) by mouth 2 (two) times daily. 06/17/13   Evelene Croon Barrett, PA-C  Multiple Vitamin (MULTIVITAMIN) capsule Take 1 capsule by mouth daily.      Historical Provider, MD  nitroGLYCERIN (NITROSTAT) 0.4 MG SL tablet Place 1 tablet (0.4 mg total) under the tongue every 5 (five) minutes x 3 doses as needed for chest pain. 06/17/13   Evelene Croon Barrett, PA-C  Omega-3 Fatty Acids (FISH OIL) 1000 MG CAPS Take 1 capsule by mouth daily.      Historical Provider, MD  omeprazole (PRILOSEC) 20 MG capsule Take 20 mg by mouth daily.    Historical Provider, MD  simvastatin (ZOCOR) 20 MG tablet Take 1 tablet (20 mg total) by mouth at bedtime. 06/23/13   Susy Frizzle, MD  traMADol (ULTRAM) 50 MG tablet Take 1 tablet (50 mg total) by mouth every 6 (six) hours as needed for moderate pain. 01/12/14   Leone Haven, MD   BP 120/60 mmHg  Pulse 73  Temp(Src) 98.3 F (36.8 C) (Oral)  Resp 16  SpO2 97% Physical Exam  Constitutional: She appears well-developed and well-nourished.  HENT:  Head: Normocephalic and atraumatic.  Eyes: Conjunctivae are normal. Pupils are equal, round, and reactive to light.  Cardiovascular: Normal rate, regular rhythm and normal heart sounds.   2+ DP pulses   Pulmonary/Chest: Effort normal and breath sounds normal.  Musculoskeletal:  Left hip with pain on internal and external rotation, there is slightly less ROM on the left compared to the right, though this is not greatly decreased, there is no tenderness of the trochanteric bursa, there are no palpable masses Right hip with full ROM and no pain on ROM, no palpable masses, no tenderness of trochanteric bursa Bilateral knees with no effusion or tenderness, full ROM, negative mcmurray, no ligamentous laxity  Neurological: She is alert.  5/5 strength in bilateral quads, hip flexors, hamstrings, plantar and dorsiflexion, sensation  to light touch intact in bilateral LE  Skin: Skin is warm and dry.    ED Course  Procedures (including critical care time) Labs Review Labs Reviewed - No data to display  Imaging Review No results found.   MDM   1. Pain in left hip    Patient with left hip pain likely related to arthritis based on pain with internal and external rotation. No injury or fall. No systemic signs of illness. Will treat pain with tramadol. Advised to contact Dr Trevor Mace office for orthopedic follow-up. Discussed that they would help determine best course of treatment, whether that be medication, PT, or joint replacement. Advised on return precautions including fever, inability to bear weight or decreased ROM, swelling, or nausea. She endorsed understanding of this.  This patient was discussed with and seen by Dr Juventino Slovak. He helped formulate the plan of care for this patient.   Tommi Rumps, MD [facility] PGY3     Leone Haven, MD 01/12/14 [phone]

## 2014-01-13 NOTE — Telephone Encounter (Signed)
Pt went to [facility] d/t the pain and suggested she go to Dr. Ninfa Linden orthopedic for her joint pain. Pt is going to call to see if can get appointment to be seen.

## 2014-01-22 ENCOUNTER — Encounter (HOSPITAL_COMMUNITY): Payer: Self-pay | Admitting: Cardiology

## 2014-01-29 ENCOUNTER — Ambulatory Visit: Payer: Medicare Other | Attending: Orthopaedic Surgery | Admitting: Physical Therapy

## 2014-01-29 ENCOUNTER — Encounter: Payer: Self-pay | Admitting: Physical Therapy

## 2014-01-29 DIAGNOSIS — M7061 Trochanteric bursitis, right hip: Secondary | ICD-10-CM | POA: Insufficient documentation

## 2014-01-29 DIAGNOSIS — M545 Low back pain: Secondary | ICD-10-CM

## 2014-01-29 DIAGNOSIS — M7062 Trochanteric bursitis, left hip: Secondary | ICD-10-CM | POA: Insufficient documentation

## 2014-01-29 NOTE — Patient Instructions (Signed)

## 2014-01-30 NOTE — Therapy (Signed)
[place], [place], [address] Phone: [phone]   Fax:  [phone]  Physical Therapy Evaluation  Patient Details  Name: April [name] MRN: [id number] Date of Birth: 1940-03-13  Encounter Date: 01/29/2014      PT End of Session - 01/30/14 0647    Visit Number 1   Number of Visits 12   Date for PT Re-Evaluation 03/12/14   PT Start Time 0550   PT Stop Time 0632   PT Time Calculation (min) 42 min   Activity Tolerance Patient tolerated treatment well      Past Medical History  Diagnosis Date  . Abnormal weight gain   . Hyperlipidemia   . GERD (gastroesophageal reflux disease)   . Hypertension   . Diverticulosis   . History of stress test     a. 09/2006 nl Dobutamine Echo  . Tracheobronchomalacia     a. followed by [facility].  Marland Kitchen COPD (chronic obstructive pulmonary disease)   . On home oxygen therapy     "2L q hs; runs into my BIPAP" (06/13/2013)  . OSA (obstructive sleep apnea)     "BIPAP w/O2" (06/13/2013)  . NIDDM (non-insulin dependent diabetes mellitus)   . Arthritis     "joints" (06/13/2013)  . Coronary artery disease, non-occlusive 06/2013    Cardiac Cath: sharp Angle take-off of Large Dominant Cx: ~70-80% mid Cx bifurcation lesion (Lateral OM &  AVGCx-PL-PDA both with hairpin ostial & ~50% lesions) - Not PCI amenable due to vessel tortuosity; ~40-50% mid LAD;Ramus - no significnat diseaes; small non-dominant RCA    Past Surgical History  Procedure Laterality Date  . Tubal ligation    . Cardiac catheterization  06/2013    Cardiac Cath: sharp Angle take-off of Large Dominant Cx: ~70-80% mid Cx bifurcation lesion (Lateral OM &  AVGCx-PL-PDA both with hairpin ostial & ~50% lesions) - Not PCI amenable due to vessel tortuosity; ~40-50% mid LAD;Ramus - no significnat diseaes; small non-dominant RCA  . Transthoracic echocardiogram  06/17/2013    Normal LV size and function. EF 55-60% with no  regional WMA. Grade 1 diastolic dysfunction. No significant valvular lesions  . Left heart catheterization with coronary angiogram N/A 06/16/2013    Procedure: LEFT HEART CATHETERIZATION WITH CORONARY ANGIOGRAM;  Surgeon: [name], MD;  Location: [facility] CATH LAB;  Service: Cardiovascular;  Laterality: N/A;    There were no vitals taken for this visit.  Visit Diagnosis:  Left low back pain, with sciatica presence unspecified - Plan: PT plan of care cert/re-cert  Trochanteric bursitis of both hips - Plan: PT plan of care cert/re-cert      Subjective Assessment - 01/29/14 1556    Symptoms Woke up with pain one morning around Thanksgiving.  Had severe pain for 1 week especially with walking, then it eased up.  Left low  back and left lateral hip and sometimes left lower leg.  No right LE symptoms.   Pertinent History NTG for heart blockage "they can't get to."  ;  COPD   How long can you sit comfortably? unlimited   How long can you stand comfortably? 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 limited by COPD   Diagnostic tests x-rays of hip were good;  they said the problem was my back arthritis   Currently in Pain? Yes   Pain Score 1    Pain Location Back  Pain Orientation Left   Pain Type Acute pain   Pain Onset 1 to 4 weeks ago   Pain Frequency Intermittent   Aggravating Factors  sitting in clinic chair   Pain Relieving Factors standing up          [facility] PT Assessment - 01/29/14 1602    Assessment   Medical Diagnosis bilateral troch bursitis, LBP with radicular symptoms   Onset Date --  4 weeks ago   Next MD Visit 02/23/14   Prior Therapy no   Precautions   Precautions None   Restrictions   Weight Bearing Restrictions No   Balance Screen   Has the patient fallen in the past 6 months No   Has the patient had a decrease in activity level because of a fear of falling?  No   Is the patient reluctant to leave  their home because of a fear of falling?  No   Home Environment   Living Enviornment Private residence   Type of Home House   Prior Function   Level of Independence Independent with basic ADLs   Vocation --  Works Gaffer   Leisure back limits working in the yard   Observation/Other Assessments   Focus on Therapeutic Outcomes (FOTO)  --  33% limit,27% predicted   Posture/Postural Control   Posture Comments decreased lumbar lordosis   AROM   Lumbar Flexion 65   Lumbar Extension 15   Lumbar - Right Side Bend 48   Lumbar - Left Side Bend 42   PROM   Right Hip External Rotation  35   Right Hip Internal Rotation  25   Left Hip External Rotation  25   Left Hip Internal Rotation  10   Strength   Left Hip ABduction 4/5   Lumbar Flexion 3/5   Lumbar Extension 3/5   Right Hip   Right Hip ABduction 4   Flexibility   Soft Tissue Assessment /Muscle Lenght --  Decreased HS length 68 degrees and hip flex bilaterally;    Slump test   Findings Positive   Side Left                  [facility] Adult PT Treatment/Exercise - 01/29/14 1602    Iontophoresis   Type of Iontophoresis Dexamethasone   Location Left SI region   Dose 30ma/ml   Time Action patch 4-6 hours                PT Education - 01/30/14 0647    Education provided Yes   Education Details Ionto instructions   Person(s) Educated Patient   Methods Explanation;Demonstration;Handout   Comprehension Verbalized understanding          PT Short Term Goals - 01/30/14 0703    PT SHORT TERM GOAL #1   Title "Independent with initial HEP   Time 3   Period Weeks   Status New   PT SHORT TERM GOAL #2   Title Patient will reports an overally pain reduction by 25% with home and work activities.   Time 3   Period Weeks   Status New           PT Long Term Goals - 01/30/14 0704    PT LONG TERM GOAL #1   Title "Demonstrate and verbalize techniques to reduce the risk of re-injury including: lifting,  posture, body mechanics   Time 6   Period Weeks   Status New   PT LONG TERM GOAL #2  Title "Pt will be independent with advanced HEP.    Time 6   Period Weeks   Status New   PT LONG TERM GOAL #3   Title "FOTO will improve from   33% to 27%    indicating improved functional mobility with less pain.   Time 6   Period Weeks   Status New   PT LONG TERM GOAL #4   Title Left hip IR improved to 15 degrees and ER to 30 degrees for greater mobility with household chores and work tasks as a Scientist, water quality in a school [employer].     Time 6   Period Weeks   Status New   PT LONG TERM GOAL #5   Title Abdominal and trunk extensor core strength improved to at least 3+/5 and hip abduction strength to 4+/5 needed for prolonged standing and walking in the community and at work.   Time 6   Period Weeks   Status New               Plan - 02/27/14 7322    Clinical Impression Statement The patient is a 73 year old with a chronic history of LBP.  About 3-4 weeks ago, she woke up with new left hip and leg pain for no apparent reason.  She reports the pain was severe for about a week and then eased off some.  She is tender in left PSIS and over left > right greater trochanteric bursa regions.  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  Prolonged periods of most positions are uncomfortable but she states she does OK with walking and standing.  She reports some difficulty with sleeping and is limited to sleeping on her sides because of her CPAP machine which may be aggravating her hip bursitis.  Decreased lumbar lordosis.  Decreased lumbar AROM:  flex 65, ext 15, right side bed 48, left sidebend 42. No directional preference identified.  Patient unable to lie prone secondary to breathing difficulties.   Decreased left hip IR 10 degrees, ER 25 degrees.  Pain with left hip scour at 11:00.  Decreased HS and hip flexor strength bilaterally. Strength  grossly 4+/5 except hip abd 4/5.  Core strength 3/5.  Positive left slump test.     Pt will benefit from skilled therapeutic intervention in order to improve on the following deficits Pain;Impaired flexibility;Decreased strength;Decreased range of motion   Rehab Potential Good   Clinical Impairments Affecting Rehab Potential Multiple co-morbidities including COPD, heart disease, diabetes   PT Frequency 2x / week   PT Duration 6 weeks   PT Treatment/Interventions ADLs/Self Care Home Management;Cryotherapy;Ultrasound;Moist Heat;Therapeutic activities;Therapeutic exercise;Neuromuscular re-education;Patient/family education;Manual techniques;Traction   PT Next Visit Plan Continue ionto trial left SI region and/or bilateral hip greater trochanter bursa(order written on initial MD referral);  stretching active or passive of left LE; manual techniques ?lumbar distraction/decompression techniques, core strengthening          G-Codes - 02-27-14 0711    Functional Assessment Tool Used FOTO; clinical judgement   Functional Limitation Mobility: Walking and moving around   Mobility: Walking and Moving Around Current Status ([id number]) At least 20 percent but less than 40 percent impaired, limited or restricted   Mobility: Walking and Moving Around Goal Status [phone]) At least 1 percent but less than 20 percent impaired, limited or restricted       Problem List Patient Active Problem List   Diagnosis Date  Noted  . Obesity (BMI 30-39.9) 09/10/2013  . Atherosclerotic heart disease of native coronary artery without angina pectoris 07/01/2013  . Hypothyroid 06/16/2013  . 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  . Unstable angina - history of 06/13/2013  . Hyperlipidemia with target LDL less than 70   . GERD (gastroesophageal reflux disease)   . Essential hypertension   . Type 2 diabetes mellitus treated without insulin   . Dyspnea   . Diverticulosis   . HOARSENESS 06/30/2009  . 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 OBSTRUCTIVE SLEEP APNEA 01/01/2007  . WEIGHT GAIN 01/01/2007    Alvera Singh 01/30/2014, 7:17 AM  [facility] [address] [place], [place], [address] Phone: [phone]   Fax:  [phone]  Ruben Im, PT 01/30/2014 7:18 AM Phone: [phone] Fax: [phone]

## 2014-02-03 ENCOUNTER — Ambulatory Visit: Payer: Medicare Other | Admitting: Physical Therapy

## 2014-02-03 DIAGNOSIS — M545 Low back pain: Secondary | ICD-10-CM

## 2014-02-03 DIAGNOSIS — M7061 Trochanteric bursitis, right hip: Secondary | ICD-10-CM

## 2014-02-03 DIAGNOSIS — M7062 Trochanteric bursitis, left hip: Secondary | ICD-10-CM

## 2014-02-03 NOTE — Therapy (Signed)
April [name], [place], [address] Phone: [phone]   Fax:  [phone]  Physical Therapy Treatment  Patient Details  Name: April [name] MRN: [id number] Date of Birth: 1940/05/22  Encounter Date: 02/03/2014      PT End of Session - 02/03/14 1212    Visit Number 2   Number of Visits 12   Date for PT Re-Evaluation 03/12/14   PT Start Time 1100   PT Stop Time 1155   PT Time Calculation (min) 55 min   Equipment Utilized During Treatment Other (comment)  Ionto Action Patch   Activity Tolerance Patient tolerated treatment well   Behavior During Therapy [facility] for tasks assessed/performed      Past Medical History  Diagnosis Date  . Abnormal weight gain   . Hyperlipidemia   . GERD (gastroesophageal reflux disease)   . Hypertension   . Diverticulosis   . History of stress test     a. 09/2006 nl Dobutamine Echo  . Tracheobronchomalacia     a. followed by [facility]  Marland Kitchen COPD (chronic obstructive pulmonary disease)   . On home oxygen therapy     "2L q hs; runs into my BIPAP" (06/13/2013)  . OSA (obstructive sleep apnea)     "BIPAP w/O2" (06/13/2013)  . NIDDM (non-insulin dependent diabetes mellitus)   . Arthritis     "joints" (06/13/2013)  . Coronary artery disease, non-occlusive 06/2013    Cardiac Cath: sharp Angle take-off of Large Dominant Cx: ~70-80% mid Cx bifurcation lesion (Lateral OM &  AVGCx-PL-PDA both with hairpin ostial & ~50% lesions) - Not PCI amenable due to vessel tortuosity; ~40-50% mid LAD;Ramus - no significnat diseaes; small non-dominant RCA    Past Surgical History  Procedure Laterality Date  . Tubal ligation    . Cardiac catheterization  06/2013    Cardiac Cath: sharp Angle take-off of Large Dominant Cx: ~70-80% mid Cx bifurcation lesion (Lateral OM &  AVGCx-PL-PDA both with hairpin ostial & ~50% lesions) - Not PCI amenable due to vessel tortuosity; ~40-50% mid LAD;Ramus - no significnat  diseaes; small non-dominant RCA  . Transthoracic echocardiogram  06/17/2013    Normal LV size and function. EF 55-60% with no regional WMA. Grade 1 diastolic dysfunction. No significant valvular lesions  . Left heart catheterization with coronary angiogram N/A 06/16/2013    Procedure: LEFT HEART CATHETERIZATION WITH CORONARY ANGIOGRAM;  Surgeon: [name], MD;  Location: [facility] CATH LAB;  Service: Cardiovascular;  Laterality: N/A;    There were no vitals taken for this visit.  Visit Diagnosis:  Left low back pain, with sciatica presence unspecified  Trochanteric bursitis of both hips      Subjective Assessment - 02/03/14 1059    Symptoms I wasn't hurting as bad when I came last time, but I am hurting more up to yesterday.  Now my pain level is 5/10   Pertinent History NTG for heart blockage "they can't get to."  ;  COPD   How long can you sit comfortably? unlimited   How long can you stand comfortably? 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 limited by COPD   Diagnostic tests x-rays of hip were good;  they said the problem was my back arthritis   Currently in Pain? Yes   Pain Score 5    Pain Location Back  L PSIS   Pain Orientation Left   Pain  Descriptors / Indicators Aching;Sore   Pain Type Acute pain   Pain Onset Today   Pain Frequency Constant   Aggravating Factors  walking, standing after treatment and the cold weather   Pain Relieving Factors  heat and taking Tramadol, sitting down actually relieve it   Effect of Pain on Daily Activities I can do everything and I am aware of pain but pt states she tries to block out the pain.  she can still maneuver but she would like to not be as aware or it.                      [facility] Adult PT Treatment/Exercise - 02/03/14 1105    Posture/Postural Control   Posture/Postural Control Postural limitations   Postural Limitations Increased lumbar lordosis;Rounded  Shoulders;Forward head;Anterior pelvic tilt  Rt iliac crest higher than Lt, Lt shoulder depressed    Posture Comments Pt instructed on proper sitting and standing posture and instructed not to " wt bear excessively on Rt hip   Lumbar Exercises: Seated   Sit to Stand 5 reps   Sit to Stand Limitations Pt requires momentum to come to standing   Lumbar Exercises: Supine   Ab Set 10 reps;5 seconds   AB Set Limitations needs VC and TC   Clam 10 reps;3 seconds  Left   Bent Knee Raise 10 reps;3 seconds  x2  pt needs VC to prevent back from arching   Bridge 10 reps;3 seconds   Bridge Limitations unable to complete full ROM   Other Supine Lumbar Exercises Pelvic tilt 10 x1 set   Lumbar Exercises: Sidelying   Other Sidelying Lumbar Exercises LT quadratus stretch in RT sidelying over pillow for 3 minutes    Knee/Hip Exercises: Stretches   Passive Hamstring Stretch 3 reps;30 seconds   ITB Stretch 3 reps;30 seconds   Iontophoresis   Type of Iontophoresis Dexamethasone   Location RT PSIS   Dose 4mg /ML   Time Action patch 4-6 hours   Manual Therapy   Manual Therapy Myofascial release   Myofascial Release Quadrutus Lumborum  Left while patient is RT sidelying with pillow under RT side   Manual Traction Pt responds with 0/10 pain with Long leg distraction on LT   Other Manual Therapy Muscle energy technique to correct Anterior sheering of LT SI                PT Education - 02/03/14 1147    Education provided Yes   Education Details Pt given handouts for [employer].  quadratus Lumborum stretch, semi squats using UE support and chair and HEP for supine Core progression.  Pt also instructed on posture in sitting and standing  Pt h   Person(s) Educated Patient   Methods Explanation;Demonstration;Handout   Comprehension Verbalized understanding;Returned demonstration;Verbal cues required          PT Short Term Goals - 02/03/14 1230    PT SHORT TERM GOAL #1   Title  "Independent with initial HEP   Time 3   Period Weeks   Status On-going   PT SHORT TERM GOAL #2   Title Patient will reports an overally pain reduction by 25% with home and work activities.   Time 3   Period Weeks   Status On-going           PT Long Term Goals - 02/03/14 1231    PT LONG TERM GOAL #1   Title "Demonstrate and verbalize techniques to reduce the  risk of re-injury including: lifting, posture, body mechanics   Time 6   Period Weeks   Status On-going   PT LONG TERM GOAL #2   Title "Pt will be independent with advanced HEP.    Time 6   Period Weeks   Status On-going   PT LONG TERM GOAL #3   Title "FOTO will improve from   33% to 27%    indicating improved functional mobility with less pain.   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     Time 6   Period Weeks   Status On-going               Plan - 02/03/14 1146    Clinical Impression Statement Pt is a 2/10 from 5/10 post treatment for quadratus lumborum stretch and myofascial release. Pt responded well to posture instruction and initial Core Supine Exercise progression with pelvic tilt,and abdominal setting as well as bent knee lift and supine hip abduction. Pt also instructed to perform bridging, semi squat with UE support and to perform quadrautus  stretch at home.  Pt given ActionPatch with Dexamethasone and reviewed Ionto precautions verbally.  Pt stated she felt patch initially had strange sensation and PT verbally instructed her to remove patch if she felt itching, pain, burning.  Pt stated she could tolerate and PT again repeated to remove if she felt discomfort/burning or itching.  Pt also responded well to posture instruction.  Pt has limited L internal rotation with PROM. See eval.   Pt will benefit from skilled therapeutic  intervention in order to improve on the following deficits Pain;Impaired flexibility;Decreased strength;Decreased range of motion   Rehab Potential Good   Clinical Impairments Affecting Rehab Potential Multiple co-morbidities including COPD, heart disease, diabetes   PT Next Visit Plan Pt will beneifit form piriformis stretch for L gluteal pain. assess Ionto benefit and stretch/HEP benefit for control of pain.  Pt to progress in Core Strength.  Assess pelvic levels   Consulted and Agree with Plan of Care Patient        Problem List Patient Active Problem List   Diagnosis Date Noted  . Obesity (BMI 30-39.9) 09/10/2013  . Atherosclerotic heart disease of native coronary artery without angina pectoris 07/01/2013  . Hypothyroid 06/16/2013  . 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  . Unstable angina - history of 06/13/2013  . Hyperlipidemia with target LDL less than 70   . GERD (gastroesophageal reflux disease)   . Essential hypertension   . Type 2 diabetes mellitus treated without insulin   . Dyspnea   . Diverticulosis   . HOARSENESS 06/30/2009  . 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 OBSTRUCTIVE SLEEP APNEA 01/01/2007  . WEIGHT GAIN 01/01/2007    Voncille Lo, PT 02/03/2014 12:35 PM Phone: [phone] Fax: [facility] [address] [place], [place], [address] Phone: [phone]   Fax:  [phone]

## 2014-02-03 NOTE — Patient Instructions (Signed)
http://orth.exer.us/136   Copyright  VHI. All rights reserved.  With all exercise try to get your abdomen SET before beginning. ( Think belly button to spine  PELVIC TILT  Lie on back, legs bent. Exhale, tilting top of pelvis back, pubic bone up, to flatten lower back. Inhale, rolling pelvis opposite way, top forward, pubic bone down, arch in back. Repeat __10__ times. Do __2__ sessions per day. Copyright  VHI. All rights reserved.    Isometric Hold With Pelvic Floor (Hook-Lying)  Lie with hips and knees bent. Slowly inhale, and then exhale. Pull navel toward spine and tighten pelvic floor. Hold for __10_ seconds. Continue to breathe in and out during hold. Rest for _10__ seconds. Repeat __10_ times. Do __2-3_ times a day.   Knee Fold  Lie on back, legs bent, arms by sides. Exhale, lifting knee to chest. Inhale, returning. Keep abdominals flat, navel to spine. Repeat __10__ times, alternating legs. Do __2__ sessions per day.  Knee Drop  Keep pelvis stable. Without rotating hips, slowly drop knee to side, pause, return to center, bring knee across midline toward opposite hip. Feel obliques engaging. Repeat for ___10_ times each leg.   Copyright  VHI. All rights reserved.  Pt given handout on R side quadratus lumborum stretch for 3 minutes daily. Pt also instructed to do semisquat 2 set of 15 daily with chair in back and chair in front  With Upper extremity support IONTOPHORESIS PATIENT PRECAUTIONS & CONTRAINDICATIONS:  . Redness under one or both electrodes can occur.  This characterized by a uniform redness that usually disappears within 12 hours of treatment. . Small pinhead size blisters may result in response to the drug.  Contact your physician if the problem persists more than 24 hours. . On rare occasions, iontophoresis therapy can result in temporary skin reactions such as rash, inflammation, irritation or burns.  The skin reactions may be the result of individual  sensitivity to the ionic solution used, the condition of the skin at the start of treatment, reaction to the materials in the electrodes, allergies or sensitivity to dexamethasone, or a poor connection between the patch and your skin.  Discontinue using iontophoresis if you have any of these reactions and report to your therapist. . Remove the Patch or electrodes if you have any undue sensation of pain or burning during the treatment and report discomfort to your therapist. . Tell your Therapist if you have had known adverse reactions to the application of electrical current. . If using the Patch, the LED light will turn off when treatment is complete and the patch can be removed.  Approximate treatment time is 1-3 hours.  Remove the patch when light goes off or after 6 hours. . The Patch can be worn during normal activity, however excessive motion where the electrodes have been placed can cause poor contact between the skin and the electrode or uneven electrical current resulting in greater risk of skin irritation. Marland Kitchen Keep out of the reach of children.   . DO NOT use if you have a cardiac pacemaker or any other electrically sensitive implanted device. . DO NOT use if you have a known sensitivity to dexamethasone. . DO NOT use during Magnetic Resonance Imaging (MRI). . DO NOT use over broken or compromised skin (e.g. sunburn, cuts, or acne) due to the increased risk of skin reaction. . DO NOT SHAVE over the area to be treated:  To establish good contact between the Patch and the skin, excessive hair may  be clipped. . DO NOT place the Patch or electrodes on or over your eyes, directly over your heart, or brain. . DO NOT reuse the Patch or electrodes as this may cause burns to occur.

## 2014-02-11 ENCOUNTER — Ambulatory Visit: Payer: Medicare Other | Admitting: Rehabilitation

## 2014-02-11 DIAGNOSIS — M545 Low back pain: Secondary | ICD-10-CM | POA: Diagnosis not present

## 2014-02-11 DIAGNOSIS — M7062 Trochanteric bursitis, left hip: Secondary | ICD-10-CM

## 2014-02-11 DIAGNOSIS — M7061 Trochanteric bursitis, right hip: Secondary | ICD-10-CM

## 2014-02-11 NOTE — Therapy (Signed)
[place], [place], [address] Phone: [phone]   Fax:  [phone]  Physical Therapy Treatment  Patient Details  Name: April [name] MRN: [id number] Date of Birth: 11-17-1940  Encounter Date: 02/11/2014      PT End of Session - 02/11/14 0811    Visit Number 3   Number of Visits 12   Date for PT Re-Evaluation 03/12/14   PT Start Time 0805   PT Stop Time 0846   PT Time Calculation (min) 41 min      Past Medical History  Diagnosis Date  . Abnormal weight gain   . Hyperlipidemia   . GERD (gastroesophageal reflux disease)   . Hypertension   . Diverticulosis   . History of stress test     a. 09/2006 nl Dobutamine Echo  . Tracheobronchomalacia     a. followed by [facility].  Marland Kitchen COPD (chronic obstructive pulmonary disease)   . On home oxygen therapy     "2L q hs; runs into my BIPAP" (06/13/2013)  . OSA (obstructive sleep apnea)     "BIPAP w/O2" (06/13/2013)  . NIDDM (non-insulin dependent diabetes mellitus)   . Arthritis     "joints" (06/13/2013)  . Coronary artery disease, non-occlusive 06/2013    Cardiac Cath: sharp Angle take-off of Large Dominant Cx: ~70-80% mid Cx bifurcation lesion (Lateral OM &  AVGCx-PL-PDA both with hairpin ostial & ~50% lesions) - Not PCI amenable due to vessel tortuosity; ~40-50% mid LAD;Ramus - no significnat diseaes; small non-dominant RCA    Past Surgical History  Procedure Laterality Date  . Tubal ligation    . Cardiac catheterization  06/2013    Cardiac Cath: sharp Angle take-off of Large Dominant Cx: ~70-80% mid Cx bifurcation lesion (Lateral OM &  AVGCx-PL-PDA both with hairpin ostial & ~50% lesions) - Not PCI amenable due to vessel tortuosity; ~40-50% mid LAD;Ramus - no significnat diseaes; small non-dominant RCA  . Transthoracic echocardiogram  06/17/2013    Normal LV size and function. EF 55-60% with no regional WMA. Grade 1 diastolic dysfunction. No significant  valvular lesions  . Left heart catheterization with coronary angiogram N/A 06/16/2013    Procedure: LEFT HEART CATHETERIZATION WITH CORONARY ANGIOGRAM;  Surgeon: [name], MD;  Location: [facility] CATH LAB;  Service: Cardiovascular;  Laterality: N/A;    There were no vitals taken for this visit.  Visit Diagnosis:  Left low back pain, with sciatica presence unspecified  Trochanteric bursitis of both hips      Subjective Assessment - 02/11/14 0809    Symptoms I am not hurting right now, I think it helped when the P.T. pulled on my leg last visit   Currently in Pain? No/denies   Aggravating Factors  unsure since pain has not returned since last visit.           [facility] PT Assessment - 02/11/14 0813    AROM   Left Hip External Rotation  20   PROM   Left Hip External Rotation  35                  [facility] Adult PT Treatment/Exercise - 02/11/14 0818    Lumbar Exercises: Supine   Ab Set 10 reps;5 seconds   Clam 10 reps;3 seconds  alternating x 10   Heel Slides 10 reps  good control; added to HEP   Bent Knee Raise 10 reps;3 seconds  x2  pt needs VC to prevent back from arching  Knee/Hip Exercises: Stretches   Piriformis Stretch 3 reps;30 seconds  (modified figure 4) also 3 x 30 knee to opp shoulder for IR    Iontophoresis   Type of Iontophoresis Dexamethasone   Location LT PSIS   Dose 16m/ML   Time Action patch 4-6 hours                PT Education - 02/11/14 0846    Education provided Yes   Education Details HEP Hip stretches   Person(s) Educated Patient   Methods Explanation;Handout   Comprehension Verbalized understanding          PT Short Term Goals - 02/11/14 0812    PT SHORT TERM GOAL #1   Title "Independent with initial HEP   Time 3   Period Weeks   Status Achieved   PT SHORT TERM GOAL #2   Title Patient will reports an overally pain reduction by 25% with home and work activities.   Time 3   Period Weeks   Status Achieved            PT Long Term Goals - 02/03/14 1231    PT LONG TERM GOAL #1   Title "Demonstrate and verbalize techniques to reduce the risk of re-injury including: lifting, posture, body mechanics   Time 6   Period Weeks   Status On-going   PT LONG TERM GOAL #2   Title "Pt will be independent with advanced HEP.    Time 6   Period Weeks   Status On-going   PT LONG TERM GOAL #3   Title "FOTO will improve from   33% to 27%    indicating improved functional mobility with less pain.   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 Good progress toward remaining goals. Left hip PROM improved. Pt reports pain at 0/10 since last visit. She is performing her HEP daily, She feels the IONTO patch may have decreased the tenderness on left PSIS area. ASIS bilateral level today in clinic.    PT Next Visit Plan Review hip rotation stretches, cont core and ionto        Problem List Patient Active Problem List   Diagnosis Date Noted  . Obesity (BMI 30-39.9) 09/10/2013  . Atherosclerotic heart disease of native coronary artery without angina pectoris 07/01/2013  . Hypothyroid 06/16/2013  . 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  . Unstable angina - history of 06/13/2013  . Hyperlipidemia with target LDL less than 70   . GERD (gastroesophageal reflux disease)   . Essential hypertension   . Type 2 diabetes mellitus treated without insulin   . Dyspnea   . Diverticulosis   . HOARSENESS 06/30/2009  . 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 OBSTRUCTIVE SLEEP APNEA 01/01/2007  . WEIGHT GAIN 01/01/2007    DDorene Ar PTA 02/11/2014, 9:11 AM  [place] [address]Phone: [phone]  Fax:  [phone]

## 2014-02-11 NOTE — Patient Instructions (Signed)
Piriformis Stretch - Supine   Pull uninvolved knee across body toward opposite shoulder. Hold slight stretch for __30_ seconds. Repeat with involved leg. Repeat _3__ times each side. Do _2__ times per day.  Copyright  VHI. All rights reserved.  Hip Stretch   Put right ankle over left knee. Let right knee fall downward, but keep ankle in place. Feel the stretch in hip. May push down gently with hand to feel stretch. Hold _30___ seconds while counting out loud. Repeat with other leg. Repeat _3___ times each way. Do __2_ sessions per day.  http://[email or web address].exer.us/498         Heel Slide to Straight   Slide one leg down to straight. Return. Be sure pelvis does not rock forward, tilt, rotate, or tip to side. Do _10__ times. Restabilize pelvis. Repeat with other leg. Do __1-2_ sets, __2_ times per day.  http://ss.exer.us/16   Copyright  VHI. All rights reserved.

## 2014-02-17 ENCOUNTER — Ambulatory Visit: Payer: Medicare Other | Attending: Orthopaedic Surgery | Admitting: Physical Therapy

## 2014-02-17 DIAGNOSIS — M545 Low back pain: Secondary | ICD-10-CM | POA: Diagnosis present

## 2014-02-17 DIAGNOSIS — M7061 Trochanteric bursitis, right hip: Secondary | ICD-10-CM | POA: Insufficient documentation

## 2014-02-17 DIAGNOSIS — M7062 Trochanteric bursitis, left hip: Secondary | ICD-10-CM | POA: Diagnosis not present

## 2014-02-17 NOTE — Therapy (Signed)
[place], [place], [address] Phone: [phone]   Fax:  [phone]  Physical Therapy Treatment  Patient Details  Name: ARMIYAH CAPRON MRN: [id number] Date of Birth: 1940-02-24  Encounter Date: 02/17/2014      PT End of Session - 02/17/14 1659    Visit Number 4   Number of Visits 12   Date for PT Re-Evaluation 03/12/14   PT Start Time 1636   PT Stop Time 1515   PT Time Calculation (min) 1359 min   Activity Tolerance Patient tolerated treatment well      Past Medical History  Diagnosis Date  . Abnormal weight gain   . Hyperlipidemia   . GERD (gastroesophageal reflux disease)   . Hypertension   . Diverticulosis   . History of stress test     a. 09/2006 nl Dobutamine Echo  . Tracheobronchomalacia     a. followed by [facility].  Marland Kitchen COPD (chronic obstructive pulmonary disease)   . On home oxygen therapy     "2L q hs; runs into my BIPAP" (06/13/2013)  . OSA (obstructive sleep apnea)     "BIPAP w/O2" (06/13/2013)  . NIDDM (non-insulin dependent diabetes mellitus)   . Arthritis     "joints" (06/13/2013)  . Coronary artery disease, non-occlusive 06/2013    Cardiac Cath: sharp Angle take-off of Large Dominant Cx: ~70-80% mid Cx bifurcation lesion (Lateral OM &  AVGCx-PL-PDA both with hairpin ostial & ~50% lesions) - Not PCI amenable due to vessel tortuosity; ~40-50% mid LAD;Ramus - no significnat diseaes; small non-dominant RCA    Past Surgical History  Procedure Laterality Date  . Tubal ligation    . Cardiac catheterization  06/2013    Cardiac Cath: sharp Angle take-off of Large Dominant Cx: ~70-80% mid Cx bifurcation lesion (Lateral OM &  AVGCx-PL-PDA both with hairpin ostial & ~50% lesions) - Not PCI amenable due to vessel tortuosity; ~40-50% mid LAD;Ramus - no significnat diseaes; small non-dominant RCA  . Transthoracic echocardiogram  06/17/2013    Normal LV size and function. EF 55-60% with no regional  WMA. Grade 1 diastolic dysfunction. No significant valvular lesions  . Left heart catheterization with coronary angiogram N/A 06/16/2013    Procedure: LEFT HEART CATHETERIZATION WITH CORONARY ANGIOGRAM;  Surgeon: [name], MD;  Location: [facility] CATH LAB;  Service: Cardiovascular;  Laterality: N/A;    There were no vitals taken for this visit.  Visit Diagnosis:  Left low back pain, with sciatica presence unspecified  Trochanteric bursitis of both hips      Subjective Assessment - 02/17/14 1628    Symptoms Feeling pretty good not having any pain at present.  No pain for about 1 week.  Back to regular schedule.   Doing exercises.  No longer taking the Tramadol/"I don't need it as long as I'm doing this well."   Pertinent History NTG for heart blockage "they can't get to."  ;  COPD   How long can you walk comfortably? limited by COPD   Currently in Pain? No/denies   Pain Orientation Left   Pain Type Acute pain   Pain Onset More than a month ago   Pain Frequency Intermittent   Aggravating Factors  Not yet, I haven't figured it out   Pain Relieving Factors exercise;                      [facility] Adult PT Treatment/Exercise - 02/17/14 0001  Lumbar Exercises: Seated   Sit to Stand 10 reps   Other Seated Lumbar Exercises --  Seated hip abd/ER red band 3x 10   Other Seated Lumbar Exercises --  Seated red band knee extension and hip flexion 10x each   Lumbar Exercises: Supine   Ab Set 10 reps;5 seconds   Dead Bug 10 reps;5 seconds   Bridge 10 reps;2 seconds  LEs over ball   Isometric Hip Flexion 10 reps;5 seconds   Other Supine Lumbar Exercises --  Lumbar rotation with LEs over ball 10x   Other Supine Lumbar Exercises --  Piriformis stretch with LEs on ball 6x R/L   Lumbar Exercises: Sidelying   Clam 10 reps   Knee/Hip Exercises: Standing   Other Standing Knee Exercises --  Isometric hip abd and extension against wall 5x each/R/L                PT Education  - 02/17/14 1717    Education provided Yes   Education Details Seated red band hip flexion, hip abd/ER, knee extension 15x 3x/week   Person(s) Educated Patient   Methods Explanation;Demonstration;Handout   Comprehension Verbalized understanding;Returned demonstration          PT Short Term Goals - 02/17/14 1721    PT SHORT TERM GOAL #1   Title "Independent with initial HEP   Status Achieved   PT SHORT TERM GOAL #2   Title Patient will reports an overally pain reduction by 25% with home and work activities.   Status Achieved           PT Long Term Goals - 02/17/14 1721    PT LONG TERM GOAL #1   Title "Demonstrate and verbalize techniques to reduce the risk of re-injury including: lifting, posture, body mechanics   Time 6   Period Weeks   Status On-going   PT LONG TERM GOAL #2   Title "Pt will be independent with advanced HEP.    Time 6   Period Weeks   Status On-going   PT LONG TERM GOAL #3   Title "FOTO will improve from   33% to 27%    indicating improved functional mobility with less pain.   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     Time 6   Period Weeks   Status On-going   PT LONG TERM GOAL #5   Title Abdominal and trunk extensor core strength improved to at least 3+/5 and hip abduction strength to 4+/5 needed for prolonged standing and walking in the community and at work.   Time 6   Period Weeks   Status On-going               Plan - 02/17/14 1718    Clinical Impression Statement Patient progressing well with pain reduction and declines the need for ionto treatment today.  Agreed to cancel next appt and follow up in 1 week.  If no further pain, will plan for discharge next visit.  Therapist closely monitoring for pain throughout treatment session.     PT Next Visit Plan  Reassess progress toward goals.  Recheck hip IR and ER ROM;  Do FOTO;  Discharge next visit if majority of goals met.  Will need G-code if D/C.  Problem List Patient Active Problem List   Diagnosis Date Noted  . Obesity (BMI 30-39.9) 09/10/2013  . Atherosclerotic heart disease of native coronary artery without angina pectoris 07/01/2013  . Hypothyroid 06/16/2013  . 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  . Unstable angina - history of 06/13/2013  . Hyperlipidemia with target LDL less than 70   . GERD (gastroesophageal reflux disease)   . Essential hypertension   . Type 2 diabetes mellitus treated without insulin   . Dyspnea   . Diverticulosis   . HOARSENESS 06/30/2009  . 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 OBSTRUCTIVE SLEEP APNEA 01/01/2007  . WEIGHT GAIN 01/01/2007    Alvera Singh 02/17/2014, 5:23 PM  [facility] [address] [place], [place], [address] Phone: [phone]   Fax:  [phone]  Ruben Im, [place] 02/17/2014 5:24 PM Phone: [phone] Fax: [phone]

## 2014-02-17 NOTE — Patient Instructions (Signed)
Seated red band hip flexion, hip abd/ER, knee extension 15x 3x/week per HEP

## 2014-02-19 ENCOUNTER — Other Ambulatory Visit: Payer: Self-pay | Admitting: Family Medicine

## 2014-02-19 ENCOUNTER — Encounter: Payer: Medicare Other | Admitting: Physical Therapy

## 2014-02-20 NOTE — Telephone Encounter (Signed)
Refill appropriate and filled per protocol. 

## 2014-02-23 ENCOUNTER — Ambulatory Visit: Payer: Medicare Other | Admitting: Physical Therapy

## 2014-02-26 ENCOUNTER — Ambulatory Visit: Payer: Medicare Other | Admitting: Rehabilitation

## 2014-02-26 DIAGNOSIS — M545 Low back pain: Secondary | ICD-10-CM | POA: Diagnosis not present

## 2014-02-26 DIAGNOSIS — M7061 Trochanteric bursitis, right hip: Secondary | ICD-10-CM

## 2014-02-26 DIAGNOSIS — M7062 Trochanteric bursitis, left hip: Principal | ICD-10-CM

## 2014-02-26 NOTE — Patient Instructions (Signed)

## 2014-02-26 NOTE — Therapy (Addendum)
[place], [place], [address] Phone: [phone]   Fax:  [phone]  Physical Therapy Treatment  Patient Details  Name: April [name] MRN: [id number] Date of Birth: [birth year] Referring Provider:  Susy Frizzle, MD  Encounter Date: 02/26/2014      PT End of Session - 02/26/14 1555    Visit Number 5   Number of Visits 12   Date for PT Re-Evaluation 03/12/14   PT Start Time 0352   PT Stop Time 0430   PT Time Calculation (min) 38 min      Past Medical History  Diagnosis Date  . Abnormal weight gain   . Hyperlipidemia   . GERD (gastroesophageal reflux disease)   . Hypertension   . Diverticulosis   . History of stress test     a. 09/2006 nl Dobutamine Echo  . Tracheobronchomalacia     a. followed by [facility].  Marland Kitchen COPD (chronic obstructive pulmonary disease)   . On home oxygen therapy     "2L q hs; runs into my BIPAP" (06/13/2013)  . OSA (obstructive sleep apnea)     "BIPAP w/O2" (06/13/2013)  . NIDDM (non-insulin dependent diabetes mellitus)   . Arthritis     "joints" (06/13/2013)  . Coronary artery disease, non-occlusive 06/2013    Cardiac Cath: sharp Angle take-off of Large Dominant Cx: ~70-80% mid Cx bifurcation lesion (Lateral OM &  AVGCx-PL-PDA both with hairpin ostial & ~50% lesions) - Not PCI amenable due to vessel tortuosity; ~40-50% mid LAD;Ramus - no significnat diseaes; small non-dominant RCA    Past Surgical History  Procedure Laterality Date  . Tubal ligation    . Cardiac catheterization  06/2013    Cardiac Cath: sharp Angle take-off of Large Dominant Cx: ~70-80% mid Cx bifurcation lesion (Lateral OM &  AVGCx-PL-PDA both with hairpin ostial & ~50% lesions) - Not PCI amenable due to vessel tortuosity; ~40-50% mid LAD;Ramus - no significnat diseaes; small non-dominant RCA  . Transthoracic echocardiogram  06/17/2013    Normal LV size and function. EF 55-60% with no regional WMA. Grade  1 diastolic dysfunction. No significant valvular lesions  . Left heart catheterization with coronary angiogram N/A 06/16/2013    Procedure: LEFT HEART CATHETERIZATION WITH CORONARY ANGIOGRAM;  Surgeon: [name], MD;  Location: [facility] CATH LAB;  Service: Cardiovascular;  Laterality: N/A;    There were no vitals taken for this visit.  Visit Diagnosis:  Trochanteric bursitis of both hips  Left low back pain, with sciatica presence unspecified      Subjective Assessment - 02/26/14 1554    Symptoms Pt reports very little pain. She would like to discharge.   Currently in Pain? No/denies          [facility] PT Assessment - 02/26/14 1558    Observation/Other Assessments   Focus on Therapeutic Outcomes (FOTO)  11% limited 27% predicted   PROM   Left Hip External Rotation  35   Left Hip Internal Rotation  30                  [facility] Adult PT Treatment/Exercise - 02/26/14 1626    Lumbar Exercises: Supine   Ab Set 10 reps;5 seconds   Heel Slides 10 reps   Bent Knee Raise 10 reps   Knee/Hip Exercises: Stretches   Piriformis Stretch 3 reps;30 seconds                PT Education - 02/26/14 1622  Education provided Yes   Education Details Self Care : Biomedical scientist Handouts Discussed   Person(s) Educated Patient   Methods Explanation;Handout   Comprehension Verbalized understanding          PT Short Term Goals - 02/17/14 1721    PT SHORT TERM GOAL #1   Title "Independent with initial HEP   Status Achieved   PT SHORT TERM GOAL #2   Title Patient will reports an overally pain reduction by 25% with home and work activities.   Status Achieved           PT Long Term Goals - 02/26/14 1556    PT LONG TERM GOAL #1   Title "Demonstrate and verbalize techniques to reduce the risk of re-injury including: lifting, posture, body mechanics   Time 6   Period Weeks   Status Achieved   PT LONG TERM GOAL #2   Title "Pt will be independent with advanced  HEP.    Time 6   Period Weeks   Status Achieved   PT LONG TERM GOAL #3   Title "FOTO will improve from   33% to 27%    indicating improved functional mobility with less pain.   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     Time 6   Period Weeks   Status Achieved   PT LONG TERM GOAL #5   Title Abdominal and trunk extensor core strength improved to at least 3+/5 and hip abduction strength to 4+/5 needed for prolonged standing and walking in the community and at work.   Time 6   Period Weeks   Status Achieved               Plan - 02/26/14 1635    Clinical Impression Statement All LTG's MET.    PT Next Visit Plan Discharge today      Gcodes submitted:  Mobility current status CI, goal status CI, Discharge status CI  Problem List Patient Active Problem List   Diagnosis Date Noted  . Obesity (BMI 30-39.9) 09/10/2013  . Atherosclerotic heart disease of native coronary artery without angina pectoris 07/01/2013  . Hypothyroid 06/16/2013  . 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  . Unstable angina - history of 06/13/2013  . Hyperlipidemia with target LDL less than 70   . GERD (gastroesophageal reflux disease)   . Essential hypertension   . Type 2 diabetes mellitus treated without insulin   . Dyspnea   . Diverticulosis   . HOARSENESS 06/30/2009  . 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 OBSTRUCTIVE SLEEP APNEA 01/01/2007  . WEIGHT GAIN 01/01/2007    Alvera Singh, PTA 02/27/2014, 9:51 AM  [facility] [address] [place], [place], [address] Phone: [phone]   Fax:  [phone]   PHYSICAL THERAPY DISCHARGE SUMMARY  Visits from Start of Care: 5  Current functional level related to goals / functional  outcomes: All STGs and LTGS met.   Remaining deficits: Patient reports very little pain and expresses readiness for discharge from PT   Education / Equipment: HEP  Plan: Patient agrees to discharge.  Patient goals were met. Patient is being discharged due to meeting the stated rehab goals.  ?????      Ruben Im,  PT 02/27/2014 9:53 AM Phone: [phone] Fax: [phone]

## 2014-02-27 ENCOUNTER — Encounter: Payer: Self-pay | Admitting: Physical Therapy

## 2014-03-09 ENCOUNTER — Ambulatory Visit (INDEPENDENT_AMBULATORY_CARE_PROVIDER_SITE_OTHER): Payer: BLUE CROSS/BLUE SHIELD | Admitting: Cardiology

## 2014-03-09 ENCOUNTER — Encounter: Payer: Self-pay | Admitting: Cardiology

## 2014-03-09 VITALS — BP 122/80 | HR 63 | Ht 61.0 in | Wt 173.5 lb

## 2014-03-09 DIAGNOSIS — R635 Abnormal weight gain: Secondary | ICD-10-CM

## 2014-03-09 DIAGNOSIS — E785 Hyperlipidemia, unspecified: Secondary | ICD-10-CM

## 2014-03-09 DIAGNOSIS — E669 Obesity, unspecified: Secondary | ICD-10-CM

## 2014-03-09 DIAGNOSIS — I10 Essential (primary) hypertension: Secondary | ICD-10-CM

## 2014-03-09 DIAGNOSIS — I200 Unstable angina: Secondary | ICD-10-CM

## 2014-03-09 DIAGNOSIS — I2510 Atherosclerotic heart disease of native coronary artery without angina pectoris: Secondary | ICD-10-CM

## 2014-03-09 NOTE — Patient Instructions (Signed)
NO CHANGE IN MEDICATIONS   Your physician wants you to follow-up in [place].  You will receive a reminder letter in the mail two months in advance. If you don't receive a letter, please call our office to schedule the follow-up appointment.

## 2014-03-11 ENCOUNTER — Encounter: Payer: Self-pay | Admitting: Cardiology

## 2014-03-11 NOTE — Assessment & Plan Note (Signed)
She clearly has a significant circumflex lesion after multiple reviews of the films I still think this is a very good PCI amenable target. Thankfully she is not having any significant symptoms. Plan:  Continue current dose of beta blocker, and Imdur. PRN NTG  Continue aspirin and statin.  Continue exercise and monitor for symptoms.

## 2014-03-11 NOTE — Assessment & Plan Note (Signed)
Well-controlled on current medications 

## 2014-03-11 NOTE — Assessment & Plan Note (Signed)
The patient understands the need to lose weight with diet and exercise. We have discussed specific strategies for this.  

## 2014-03-11 NOTE — Assessment & Plan Note (Addendum)
Relatively stable weight for 6 months.  She admits that she has "fallen off the wagon with her walking "

## 2014-03-11 NOTE — Assessment & Plan Note (Signed)
Lab Results  Component Value Date   CHOL 165 12/16/2013   HDL 46 12/16/2013   LDLCALC 72 12/16/2013   TRIG 235* 12/16/2013   CHOLHDL 3.6 12/16/2013  on simvastatin. Excellent control. Would consider NMR panel for next check to confirm that the screening labs correlate.monitored by PCP

## 2014-03-11 NOTE — Progress Notes (Signed)
PATIENTTaressa Rauh [name] MRN: [id number]  DOB: [birth year]   DOV:03/11/2014 PCP: Odette Fraction, MD  Clinic Note: Chief Complaint  Patient presents with  . 6 month visit     no chest pain , sob- sometime "hava a breathing problem , no edema    HPI: April [name] is a 74 y.o.  female with a PMH below who presents today for 76-month followup visit.  Her most recent cardiac episode was in early May a 2015 --> she underwent cardiac catheterization revealing single vessel severe disease in extremely tortuous dominant circumflex that was not thought to be safe PCI. I reviewed the images with Dr. Burt Knack we both felt that it would be unsafe to attempt PCI on this lesion. The plan was for medical therapy and she has done relatively well. She does have baseline COPD which limits her activity level at baseline anyway with underlying dyspnea. She was started on beta blocker and Imdur. Her primary reduce the beta blocker from 25 twice a day to 12.5 twice a day because of headache.  Interval History: She presents today doing quite well without any major complaints. She continues to note that she does not have any notable increase of dyspnea than her baseline dyspnea to no symptoms consistent with a chest discomfort that led to her admission and catheterization in May.  She denies any resting or exertional chest tightness or pressure. She is actually tried to become more active and exercise but notes that she "had some times when she has a "breathing problem making it difficult to catch her breath "this is probably related to her tracheobronchomalacia. She has occasional mild "skipped beats" but nothing significant  She has been more conscious of her need to try to exercise, and has been doing more walking -- with doing so she denies any of the chest pressure she noted before.Marland Kitchen No PND, orthopnea with only occasional mild edema. No rapid or irregular heartbeat/palpitations or rhythms. No syncope/near syncope or  TIA/amaurosis fugax. No melena, hematochezia, hematuria, or epistaxis. No claudication symptoms.  Past Medical History  Diagnosis Date  . Abnormal weight gain   . Hyperlipidemia   . GERD (gastroesophageal reflux disease)   . Hypertension   . Diverticulosis   . History of stress test     a. 09/2006 nl Dobutamine Echo  . Tracheobronchomalacia     a. followed by  [facility].  Marland Kitchen COPD (chronic obstructive pulmonary disease)   . On home oxygen therapy     "2L q hs; runs into my BIPAP" (06/13/2013)  . OSA (obstructive sleep apnea)     "BIPAP w/O2" (06/13/2013)  . NIDDM (non-insulin dependent diabetes mellitus)   . Arthritis     "joints" (06/13/2013)  . Coronary artery disease, non-occlusive 06/2013    Cardiac Cath: sharp Angle take-off of Large Dominant Cx: ~70-80% mid Cx bifurcation lesion (Lateral OM &  AVGCx-PL-PDA both with hairpin ostial & ~50% lesions) - Not PCI amenable due to vessel tortuosity; ~40-50% mid LAD;Ramus - no significnat diseaes; small non-dominant RCA    Prior Cardiac Evaluation and Past Surgical History: Past Surgical History  Procedure Laterality Date  . Tubal ligation    . Cardiac catheterization  06/2013    Cardiac Cath: sharp Angle take-off of Large Dominant Cx: ~70-80% mid Cx bifurcation lesion (Lateral OM &  AVGCx-PL-PDA both with hairpin ostial & ~50% lesions) - Not PCI amenable due to vessel tortuosity; ~40-50% mid LAD;Ramus - no significnat diseaes; small non-dominant RCA  . Transthoracic  echocardiogram  06/17/2013    Normal LV size and function. EF 55-60% with no regional WMA. Grade 1 diastolic dysfunction. No significant valvular lesions  . Left heart catheterization with coronary angiogram N/A 06/16/2013    Procedure: LEFT HEART CATHETERIZATION WITH CORONARY ANGIOGRAM;  Surgeon: [name], MD;  Location: [facility] CATH LAB;  Service: Cardiovascular;  Laterality: N/A;    Allergies  Allergen Reactions  . Neomycin Other (See Comments)    Visual disturbance and  swelling in eyes    Current Outpatient Prescriptions  Medication Sig Dispense Refill  . aspirin 81 MG tablet Take 81 mg by mouth daily.      . Calcium Carbonate-Vitamin D (CALCIUM 600 + D PO) Take 1 tablet by mouth daily.      Marland Kitchen gabapentin (NEURONTIN) 600 MG tablet Take 600 mg by mouth 4 (four) times daily.    Marland Kitchen glipiZIDE (GLUCOTROL XL) 5 MG 24 hr tablet Take 1 tablet (5 mg total) by mouth daily. 30 tablet 5  . hydrochlorothiazide (MICROZIDE) 12.5 MG capsule TAKE 1 CAPSULE BY MOUTH DAILY 30 capsule 5  . isosorbide mononitrate (IMDUR) 30 MG 24 hr tablet Take 1 tablet (30 mg total) by mouth daily. 30 tablet 11  . meloxicam (MOBIC) 7.5 MG tablet Take 7.5 mg by mouth daily as needed.   6  . metFORMIN (GLUCOPHAGE) 500 MG tablet TAKE 1 TABLET TWICE DAILY 120 tablet 3  . metoprolol tartrate (LOPRESSOR) 25 MG tablet Take 1 tablet (25 mg total) by mouth 2 (two) times daily. 60 tablet 11  . Multiple Vitamin (MULTIVITAMIN) capsule Take 1 capsule by mouth daily.      . nitroGLYCERIN (NITROSTAT) 0.4 MG SL tablet Place 1 tablet (0.4 mg total) under the tongue every 5 (five) minutes x 3 doses as needed for chest pain. 25 tablet 12  . Omega-3 Fatty Acids (FISH OIL) 1000 MG CAPS Take 1 capsule by mouth daily.      Marland Kitchen omeprazole (PRILOSEC) 20 MG capsule Take 20 mg by mouth daily.    . simvastatin (ZOCOR) 20 MG tablet Take 1 tablet (20 mg total) by mouth at bedtime. 90 tablet 3  . traMADol (ULTRAM) 50 MG tablet Take 1 tablet (50 mg total) by mouth every 6 (six) hours as needed for moderate pain. 20 tablet 0   No current facility-administered medications for this visit.    History   Social History Narrative   Lives in [place] with her husband. 3 children.  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   Former 30 Pk/yr Smoker (1.5 PPD x 20 yr) - quit in 1998.    ROS: A comprehensive Review of Systems was performed. Review of Systems  Constitutional: Positive for malaise/fatigue. Negative for diaphoresis.  HENT:  Negative for nosebleeds.   Eyes: Negative for blurred vision and double vision.  Respiratory: Positive for shortness of breath (Intermittent episodes of coughing and wheezing that make her short of breath).   Cardiovascular: Negative for claudication.       Per history of present illness  Gastrointestinal: Negative for blood in stool and melena.  Genitourinary: Negative for hematuria.  Musculoskeletal: Positive for joint pain.  Neurological: Negative for dizziness, focal weakness and loss of consciousness.  Endo/Heme/Allergies: Bruises/bleeds easily.  Psychiatric/Behavioral: The patient is nervous/anxious.   All other systems reviewed and are negative.  PHYSICAL EXAM Wt Readings from Last 3 Encounters:  03/09/14 173 lb 8 oz (78.699 kg)  12/22/13 170 lb (77.111 kg)  09/08/13 171 lb 3.2 oz (77.656 kg)  BP 122/80 mmHg  Pulse 63  Ht 5\' 1"  (1.549 m)  Wt 173 lb 8 oz (78.699 kg)  BMI 32.80 kg/m2 General appearance: alert, cooperative, appears stated age, no distress, mildly obese and Pleasant mood and affect.  Neck: no adenopathy, no carotid bruit, no JVD and supple, symmetrical, trachea midline Lungs: Non-labored with mild diffuse crackles that clear with cough. No significant wheezing or rales. She does have prolonged expiratory phase Heart: regular rate and rhythm, S1, S2 normal, no murmur, click, rub or gallop and normal apical impulse Abdomen: soft, non-tender; bowel sounds normal; no masses,  no organomegaly Extremities: extremities normal, atraumatic, no cyanosis or edema Pulses: 2+ and symmetric Neurologic: Grossly normal   Adult ECG Report - normal sinus rhythm, rate 63. Nonspecific ST and T wave changes with baseline artifact -- no notable change  ASSESSMENT / PLAN: Atherosclerotic heart disease of native coronary artery without angina pectoris She clearly has a significant circumflex lesion after multiple reviews of the films I still think this is a very good PCI amenable  target. Thankfully she is not having any significant symptoms. Plan:  Continue current dose of beta blocker, and Imdur. PRN NTG  Continue aspirin and statin.  Continue exercise and monitor for symptoms.   Essential hypertension Well-controlled on current medications.   Hyperlipidemia with target LDL less than 70 Lab Results  Component Value Date   CHOL 165 12/16/2013   HDL 46 12/16/2013   LDLCALC 72 12/16/2013   TRIG 235* 12/16/2013   CHOLHDL 3.6 12/16/2013  on simvastatin. Excellent control. Would consider NMR panel for next check to confirm that the screening labs correlate.monitored by PCP    WEIGHT GAIN Relatively stable weight for 6 months.  She admits that she has "fallen off the wagon with her walking "   Obesity (BMI 30-39.9) The patient understands the need to lose weight with diet and exercise. We have discussed specific strategies for this.    Unstable angina - history of Has not had notable angina symptoms since her PCI.    Orders Placed This Encounter  Procedures  . EKG 12-Lead    Followup: 6 months  DAVID W. Ellyn Hack, M.D., M.S. Interventional Cardiology [facility]

## 2014-03-11 NOTE — Assessment & Plan Note (Signed)
Has not had notable angina symptoms since her PCI.

## 2014-03-25 ENCOUNTER — Other Ambulatory Visit: Payer: Self-pay | Admitting: Family Medicine

## 2014-03-25 NOTE — Telephone Encounter (Signed)
Medication refilled per protocol. 

## 2014-05-01 ENCOUNTER — Other Ambulatory Visit: Payer: Self-pay | Admitting: Family Medicine

## 2014-06-14 ENCOUNTER — Other Ambulatory Visit: Payer: Self-pay | Admitting: Physician Assistant

## 2014-06-15 NOTE — Telephone Encounter (Signed)
Rx refill sent to patient pharmacy   

## 2014-06-30 ENCOUNTER — Encounter: Payer: Self-pay | Admitting: Family Medicine

## 2014-06-30 ENCOUNTER — Other Ambulatory Visit: Payer: Self-pay | Admitting: Family Medicine

## 2014-06-30 NOTE — Telephone Encounter (Signed)
Med refill x 1.  Pt needs to be seen.  Letter sent

## 2014-07-07 ENCOUNTER — Other Ambulatory Visit: Payer: Self-pay | Admitting: Physician Assistant

## 2014-07-08 NOTE — Telephone Encounter (Signed)
This is a Dr. [facility] patient  

## 2014-07-08 NOTE — Telephone Encounter (Signed)
Rx has been sent to the pharmacy electronically. ° °

## 2014-07-16 ENCOUNTER — Encounter: Payer: Self-pay | Admitting: Family Medicine

## 2014-07-21 ENCOUNTER — Other Ambulatory Visit: Payer: Medicare Other

## 2014-07-21 DIAGNOSIS — I10 Essential (primary) hypertension: Secondary | ICD-10-CM

## 2014-07-21 DIAGNOSIS — E119 Type 2 diabetes mellitus without complications: Secondary | ICD-10-CM

## 2014-07-21 DIAGNOSIS — E038 Other specified hypothyroidism: Secondary | ICD-10-CM

## 2014-07-21 DIAGNOSIS — E785 Hyperlipidemia, unspecified: Secondary | ICD-10-CM

## 2014-07-21 LAB — CBC WITH DIFFERENTIAL/PLATELET
BASOS PCT: 0 % (ref 0–1)
Basophils Absolute: 0 10*3/uL (ref 0.0–0.1)
EOS ABS: 0.2 10*3/uL (ref 0.0–0.7)
EOS PCT: 2 % (ref 0–5)
HCT: 42.5 % (ref 36.0–46.0)
Hemoglobin: 14 g/dL (ref 12.0–15.0)
LYMPHS PCT: 34 % (ref 12–46)
Lymphs Abs: 3.2 10*3/uL (ref 0.7–4.0)
MCH: 29.3 pg (ref 26.0–34.0)
MCHC: 32.9 g/dL (ref 30.0–36.0)
MCV: 88.9 fL (ref 78.0–100.0)
MONOS PCT: 6 % (ref 3–12)
MPV: 9.9 fL (ref 8.6–12.4)
Monocytes Absolute: 0.6 10*3/uL (ref 0.1–1.0)
NEUTROS ABS: 5.4 10*3/uL (ref 1.7–7.7)
Neutrophils Relative %: 58 % (ref 43–77)
Platelets: 282 10*3/uL (ref 150–400)
RBC: 4.78 MIL/uL (ref 3.87–5.11)
RDW: 13.4 % (ref 11.5–15.5)
WBC: 9.3 10*3/uL (ref 4.0–10.5)

## 2014-07-21 LAB — HEMOGLOBIN A1C
Hgb A1c MFr Bld: 7.8 % — ABNORMAL HIGH (ref <5.7)
MEAN PLASMA GLUCOSE: 177 mg/dL — AB (ref <117)

## 2014-07-22 LAB — COMPLETE METABOLIC PANEL WITH GFR
ALBUMIN: 3.8 g/dL (ref 3.5–5.2)
ALK PHOS: 54 U/L (ref 39–117)
ALT: 31 U/L (ref 0–35)
AST: 29 U/L (ref 0–37)
BUN: 22 mg/dL (ref 6–23)
CHLORIDE: 99 meq/L (ref 96–112)
CO2: 32 meq/L (ref 19–32)
Calcium: 9.4 mg/dL (ref 8.4–10.5)
Creat: 0.61 mg/dL (ref 0.50–1.10)
GFR, Est African American: >89 mL/min
GFR, Est Non African American: >89 mL/min
GLUCOSE: 149 mg/dL — AB (ref 70–99)
POTASSIUM: 4.5 meq/L (ref 3.5–5.3)
Sodium: 140 mEq/L (ref 135–145)
TOTAL PROTEIN: 6.8 g/dL (ref 6.0–8.3)
Total Bilirubin: 0.4 mg/dL (ref 0.2–1.2)

## 2014-07-22 LAB — LIPID PANEL
Cholesterol: 182 mg/dL (ref 0–200)
HDL: 40 mg/dL — ABNORMAL LOW (ref >=46)
LDL Cholesterol: 98 mg/dL (ref 0–99)
Total CHOL/HDL Ratio: 4.6 Ratio
Triglycerides: 220 mg/dL — ABNORMAL HIGH (ref <150)
VLDL: 44 mg/dL — AB (ref 0–40)

## 2014-07-22 LAB — TSH: TSH: 5.25 u[IU]/mL — AB (ref 0.350–4.500)

## 2014-07-27 ENCOUNTER — Ambulatory Visit (INDEPENDENT_AMBULATORY_CARE_PROVIDER_SITE_OTHER): Payer: Medicare Other | Admitting: Family Medicine

## 2014-07-27 ENCOUNTER — Encounter: Payer: Self-pay | Admitting: Family Medicine

## 2014-07-27 VITALS — BP 126/74 | HR 78 | Temp 98.3°F | Resp 16 | Ht 62.0 in | Wt 170.0 lb

## 2014-07-27 DIAGNOSIS — E1140 Type 2 diabetes mellitus with diabetic neuropathy, unspecified: Secondary | ICD-10-CM | POA: Diagnosis not present

## 2014-07-27 DIAGNOSIS — Z0000 Encounter for general adult medical examination without abnormal findings: Secondary | ICD-10-CM | POA: Diagnosis not present

## 2014-07-27 DIAGNOSIS — I10 Essential (primary) hypertension: Secondary | ICD-10-CM

## 2014-07-27 DIAGNOSIS — E785 Hyperlipidemia, unspecified: Secondary | ICD-10-CM | POA: Diagnosis not present

## 2014-07-27 MED ORDER — METFORMIN HCL 1000 MG PO TABS: 1000.0000 mg | ORAL_TABLET | Freq: Two times a day (BID) | ORAL | Status: DC

## 2014-07-27 NOTE — Progress Notes (Signed)
Subjective:    Patient ID: April [name], female    DOB: [birth year], 74 y.o.   MRN: [id number]  HPI Is here today for complete physical exam. Her mammogram is not due until August. She had a bone density last August which was significant for low bone mass per was otherwise acceptable. Due to age and past medical history she no longer requires a Pap smear. She is due for colonoscopy with Dr. Paulita Fujita. Immunizations are up-to-date. Most recent lab work as listed below and is significant for hemoglobin A1c of 7.8. Past Medical History  Diagnosis Date  . Abnormal weight gain   . Hyperlipidemia   . GERD (gastroesophageal reflux disease)   . Hypertension   . Diverticulosis   . History of stress test     a. 09/2006 nl Dobutamine Echo  . Tracheobronchomalacia     a. followed by [facility].  Marland Kitchen COPD (chronic obstructive pulmonary disease)   . On home oxygen therapy     "2L q hs; runs into my BIPAP" (06/13/2013)  . OSA (obstructive sleep apnea)     "BIPAP w/O2" (06/13/2013)  . NIDDM (non-insulin dependent diabetes mellitus)   . Arthritis     "joints" (06/13/2013)  . Coronary artery disease, non-occlusive 06/2013    Cardiac Cath: sharp Angle take-off of Large Dominant Cx: ~70-80% mid Cx bifurcation lesion (Lateral OM &  AVGCx-PL-PDA both with hairpin ostial & ~50% lesions) - Not PCI amenable due to vessel tortuosity; ~40-50% mid LAD;Ramus - no significnat diseaes; small non-dominant RCA   Past Surgical History  Procedure Laterality Date  . Tubal ligation    . Cardiac catheterization  06/2013    Cardiac Cath: sharp Angle take-off of Large Dominant Cx: ~70-80% mid Cx bifurcation lesion (Lateral OM &  AVGCx-PL-PDA both with hairpin ostial & ~50% lesions) - Not PCI amenable due to vessel tortuosity; ~40-50% mid LAD;Ramus - no significnat diseaes; small non-dominant RCA  . Transthoracic echocardiogram  06/17/2013    Normal LV size and function. EF 55-60% with no regional WMA. Grade 1 diastolic  dysfunction. No significant valvular lesions  . Left heart catheterization with coronary angiogram N/A 06/16/2013    Procedure: LEFT HEART CATHETERIZATION WITH CORONARY ANGIOGRAM;  Surgeon: [name], MD;  Location: [facility] CATH LAB;  Service: Cardiovascular;  Laterality: N/A;   Current Outpatient Prescriptions on File Prior to Visit  Medication Sig Dispense Refill  . aspirin 81 MG tablet Take 81 mg by mouth daily.      . Calcium Carbonate-Vitamin D (CALCIUM 600 + D PO) Take 1 tablet by mouth daily.      Marland Kitchen gabapentin (NEURONTIN) 600 MG tablet TAKE 1 TABLET 4 TIMES DAILY 360 tablet 3  . glipiZIDE (GLUCOTROL XL) 5 MG 24 hr tablet Take 1 tablet (5 mg total) by mouth daily. 30 tablet 5  . hydrochlorothiazide (MICROZIDE) 12.5 MG capsule TAKE 1 CAPSULE BY MOUTH DAILY 30 capsule 5  . isosorbide mononitrate (IMDUR) 30 MG 24 hr tablet TAKE 1 TABLET (30 MG TOTAL) BY MOUTH DAILY. 30 tablet 6  . meloxicam (MOBIC) 7.5 MG tablet Take 7.5 mg by mouth daily as needed.   6  . metFORMIN (GLUCOPHAGE) 500 MG tablet TAKE 1 TABLET TWICE DAILY 120 tablet 3  . metoprolol tartrate (LOPRESSOR) 25 MG tablet TAKE 1 TABLET (25 MG TOTAL) BY MOUTH 2 (TWO) TIMES DAILY. 60 tablet 6  . Multiple Vitamin (MULTIVITAMIN) capsule Take 1 capsule by mouth daily.      . nitroGLYCERIN (  NITROSTAT) 0.4 MG SL tablet Place 1 tablet (0.4 mg total) under the tongue every 5 (five) minutes x 3 doses as needed for chest pain. 25 tablet 12  . Omega-3 Fatty Acids (FISH OIL) 1000 MG CAPS Take 1 capsule by mouth daily.      Marland Kitchen omeprazole (PRILOSEC) 20 MG capsule Take 20 mg by mouth daily.    . simvastatin (ZOCOR) 20 MG tablet TAKE 1 TABLET (20 MG TOTAL) BY MOUTH AT BEDTIME. 90 tablet 0  . traMADol (ULTRAM) 50 MG tablet Take 1 tablet (50 mg total) by mouth every 6 (six) hours as needed for moderate pain. 20 tablet 0   No current facility-administered medications on file prior to visit.   Allergies  Allergen Reactions  . Neomycin Other (See  Comments)    Visual disturbance and swelling in eyes   History   Social History  . Marital Status: Married    Spouse Name: N/A  . Number of Children: 3  . Years of Education: N/A   Occupational History  . Systems analyst    Social History Main Topics  . Smoking status: Former Smoker -- 1.50 packs/day for 20 years    Types: Cigarettes    Quit date: 02/13/1986  . Smokeless tobacco: Never Used  . Alcohol Use: No  . Drug Use: No  . Sexual Activity: Yes   Other Topics Concern  . Not on file   Social History Narrative   Lives in [place] with her husband. 3 children.  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   Former 30 Pk/yr Smoker (1.5 PPD x 20 yr) - quit in 1998.   Family History  Problem Relation Age of Onset  . Asthma Maternal Grandmother   . Heart disease Mother     developed coronary dzs in her 37's.  Marland Kitchen Heart disease Maternal Grandmother   . Heart disease Maternal Grandfather   . Clotting disorder Mother   . Diabetes Brother   . Diabetes Paternal Aunt   . CAD Brother     s/p cabg in his 16's  . CAD Brother     s/p cabg in his 53's.      Review of Systems  All other systems reviewed and are negative.      Objective:   Physical Exam  Constitutional: She is oriented to person, place, and time. She appears well-developed and well-nourished. No distress.  HENT:  Head: Normocephalic and atraumatic.  Right Ear: External ear normal.  Left Ear: External ear normal.  Nose: Nose normal.  Mouth/Throat: Oropharynx is clear and moist. No oropharyngeal exudate.  Eyes: Conjunctivae and EOM are normal. Pupils are equal, round, and reactive to light. Right eye exhibits no discharge. Left eye exhibits no discharge. No scleral icterus.  Neck: Normal range of motion. Neck supple. No JVD present. No thyromegaly present.  Cardiovascular: Normal rate, regular rhythm and intact distal pulses.  Exam reveals no gallop and no friction rub.   No murmur heard. Pulmonary/Chest: Effort  normal and breath sounds normal. No stridor. No respiratory distress. She has no wheezes. She has no rales. She exhibits no tenderness.  Abdominal: Soft. Bowel sounds are normal. She exhibits no distension and no mass. There is no tenderness. There is no rebound and no guarding.  Musculoskeletal: Normal range of motion. She exhibits no edema or tenderness.  Lymphadenopathy:    She has no cervical adenopathy.  Neurological: She is alert and oriented to person, place, and time. She has normal reflexes. She displays normal reflexes.  No cranial nerve deficit. She exhibits normal muscle tone. Coordination normal.  Skin: Skin is warm. No rash noted. She is not diaphoretic. No erythema. No pallor.  Psychiatric: She has a normal mood and affect. Her behavior is normal. Judgment and thought content normal.  Vitals reviewed.         Assessment & Plan:  Diabetic neuropathy, type II diabetes mellitus  Essential hypertension  HLD (hyperlipidemia)  Routine general medical examination at a health care facility  Patient's blood pressure is excellent. Cholesterol is acceptable. Increase metformin to 1000 mg by mouth twice a day and recheck blood sugars in 3 months. If still elevated, increase glipizide to 10 mg a day. Consider adding Januvia if unsuccessful. Immunizations are up-to-date. Mammogram is up-to-date. I will schedule the patient for colonoscopy. Bone density is up-to-date

## 2014-08-18 ENCOUNTER — Other Ambulatory Visit: Payer: Self-pay

## 2014-08-18 DIAGNOSIS — Z1231 Encounter for screening mammogram for malignant neoplasm of breast: Secondary | ICD-10-CM

## 2014-08-25 ENCOUNTER — Other Ambulatory Visit: Payer: Self-pay | Admitting: Family Medicine

## 2014-08-25 NOTE — Telephone Encounter (Signed)
Refill appropriate and filled per protocol. 

## 2014-09-06 ENCOUNTER — Other Ambulatory Visit: Payer: Self-pay | Admitting: Family Medicine

## 2014-09-08 ENCOUNTER — Ambulatory Visit: Payer: BLUE CROSS/BLUE SHIELD | Admitting: Pulmonary Disease

## 2014-09-11 ENCOUNTER — Ambulatory Visit: Payer: BLUE CROSS/BLUE SHIELD | Admitting: Cardiology

## 2014-09-11 ENCOUNTER — Other Ambulatory Visit: Payer: Self-pay | Admitting: Gastroenterology

## 2014-09-16 LAB — HM DIABETES EYE EXAM

## 2014-09-17 ENCOUNTER — Telehealth: Payer: Self-pay | Admitting: Family Medicine

## 2014-09-17 NOTE — Telephone Encounter (Signed)
LMTRC

## 2014-09-17 NOTE — Telephone Encounter (Signed)
Patient calling to discuss her sugar reading going up and down still not where they need to be  [phone]

## 2014-09-25 NOTE — Telephone Encounter (Signed)
LMTRC

## 2014-09-28 ENCOUNTER — Ambulatory Visit
Admission: RE | Admit: 2014-09-28 | Discharge: 2014-09-28 | Disposition: A | Discharge status: Home / Self Care | Payer: Medicare Other | Source: Ambulatory Visit

## 2014-09-28 DIAGNOSIS — Z1231 Encounter for screening mammogram for malignant neoplasm of breast: Secondary | ICD-10-CM

## 2014-09-28 NOTE — Telephone Encounter (Signed)
Closing chart = no return call  

## 2014-09-30 ENCOUNTER — Encounter: Payer: Self-pay | Admitting: Family Medicine

## 2014-10-20 ENCOUNTER — Other Ambulatory Visit: Payer: Self-pay | Admitting: Family Medicine

## 2014-10-23 ENCOUNTER — Encounter: Payer: Self-pay | Admitting: Cardiology

## 2014-10-23 ENCOUNTER — Ambulatory Visit (INDEPENDENT_AMBULATORY_CARE_PROVIDER_SITE_OTHER): Payer: Medicare Other | Admitting: Cardiology

## 2014-10-23 VITALS — BP 128/78 | HR 68 | Ht 61.0 in | Wt 171.6 lb

## 2014-10-23 DIAGNOSIS — I10 Essential (primary) hypertension: Secondary | ICD-10-CM | POA: Diagnosis not present

## 2014-10-23 DIAGNOSIS — I2510 Atherosclerotic heart disease of native coronary artery without angina pectoris: Secondary | ICD-10-CM

## 2014-10-23 DIAGNOSIS — G4733 Obstructive sleep apnea (adult) (pediatric): Secondary | ICD-10-CM | POA: Diagnosis not present

## 2014-10-23 DIAGNOSIS — E785 Hyperlipidemia, unspecified: Secondary | ICD-10-CM | POA: Diagnosis not present

## 2014-10-23 DIAGNOSIS — E669 Obesity, unspecified: Secondary | ICD-10-CM

## 2014-10-23 NOTE — Progress Notes (Signed)
PCP: Odette Fraction, MD  Clinic Note: Chief Complaint  Patient presents with  . Follow-up    6 months:  No complaints of chest pain, SOB, edema or dizziness.    HPI: April [name] is a 74 y.o. female with a PMH below who presents today for ~6-8 month f/u of CAD.  Her most recent cardiac episode was in early May a 2015 --> Cardiac catheterization revealing single vessel severe disease in extremely tortuous dominant circumflex that was not thought to be safe PCI. I reviewed the images with Dr. Burt Knack we both felt that it would be unsafe to attempt PCI on this lesion --> Med Rx recommended.  She does have baseline COPD which limits her activity level at baseline anyway with underlying dyspnea.  She was started on beta blocker and Imdur, but her PCP reduced the beta blocker from 25 twice a day to 12.5 twice a day because of headache.  Scherrie P Lopez was last seen in Jan 2016 - was doing well with no CAD complaints.  Recent Hospitalizations: n/a  Studies Reviewed: n/a  Interval History: She presents today we will any major complaints. She is not overly active bone, being somewhat limited by her COPD with baseline dyspnea. No changes to her baseline. No worsening exertional dyspnea. No resting dyspnea. No resting or exertional chest tightness or pressure to suggest angina.   No PND, orthopnea or edema. -- sleeps with BiPAP  No palpitations, lightheadedness, dizziness, weakness or syncope/near syncope. No TIA/amaurosis fugax symptoms. No melena, hematochezia, hematuria, or epstaxis. No claudication.  Past Medical History  Diagnosis Date  . Abnormal weight gain   . Hyperlipidemia   . GERD (gastroesophageal reflux disease)   . Hypertension   . Diverticulosis   . History of stress test     a. 09/2006 nl Dobutamine Echo  . Tracheobronchomalacia     a. followed by [facility].  Marland Kitchen COPD (chronic obstructive pulmonary disease)   . On home oxygen therapy     "2L q hs; runs into my  BIPAP" (06/13/2013)  . OSA (obstructive sleep apnea)     "BIPAP w/O2" (06/13/2013)  . NIDDM (non-insulin dependent diabetes mellitus)   . Arthritis     "joints" (06/13/2013)  . Coronary artery disease, non-occlusive 06/2013    Cardiac Cath: sharp Angle take-off of Large Dominant Cx: ~70-80% mid Cx bifurcation lesion (Lateral OM &  AVGCx-PL-PDA both with hairpin ostial & ~50% lesions) - Not PCI amenable due to vessel tortuosity; ~40-50% mid LAD;Ramus - no significnat diseaes; small non-dominant RCA    Past Surgical History  Procedure Laterality Date  . Tubal ligation    . Cardiac catheterization  06/2013    Cardiac Cath: sharp Angle take-off of Large Dominant Cx: ~70-80% mid Cx bifurcation lesion (Lateral OM &  AVGCx-PL-PDA both with hairpin ostial & ~50% lesions) - Not PCI amenable due to vessel tortuosity; ~40-50% mid LAD;Ramus - no significnat diseaes; small non-dominant RCA  . Transthoracic echocardiogram  06/17/2013    Normal LV size and function. EF 55-60% with no regional WMA. Grade 1 diastolic dysfunction. No significant valvular lesions  . Left heart catheterization with coronary angiogram N/A 06/16/2013    Procedure: LEFT HEART CATHETERIZATION WITH CORONARY ANGIOGRAM;  Surgeon: [name], MD;  Location: [facility] CATH LAB;  Service: Cardiovascular;  Laterality: N/A;    ROS: A comprehensive was performed. Review of Systems  Constitutional:       Generally feels somewhat tired and weak. Nothing new.  Eyes: Negative for blurred vision.  Respiratory: Positive for cough (Chronic cough that makes her somewhat short of breath), shortness of breath and wheezing.   Cardiovascular: Negative for claudication and leg swelling.  Gastrointestinal: Negative for blood in stool and melena.  Genitourinary: Negative for hematuria.  Neurological: Positive for dizziness (Occasional positional dizziness). Negative for headaches.  Endo/Heme/Allergies: Bruises/bleeds easily.  Psychiatric/Behavioral:        Noticeably less anxious than last visit  All other systems reviewed and are negative.   Prior to Admission medications   Medication Sig Start Date End Date Taking? Authorizing Provider  aspirin 81 MG tablet Take 81 mg by mouth daily.     Yes Historical Provider, MD  Calcium Carbonate-Vitamin D (CALCIUM 600 + D PO) Take 1 tablet by mouth daily.     Yes Historical Provider, MD  gabapentin (NEURONTIN) 600 MG tablet TAKE 1 TABLET 4 TIMES DAILY 03/25/14  Yes Susy Frizzle, MD  glipiZIDE (GLUCOTROL XL) 5 MG 24 hr tablet TAKE 1 TABLET (5 MG TOTAL) BY MOUTH DAILY. 08/25/14  Yes Susy Frizzle, MD  hydrochlorothiazide (MICROZIDE) 12.5 MG capsule TAKE 1 CAPSULE BY MOUTH DAILY 05/01/14  Yes Susy Frizzle, MD  isosorbide mononitrate (IMDUR) 30 MG 24 hr tablet TAKE 1 TABLET (30 MG TOTAL) BY MOUTH DAILY. 06/15/14  Yes [name], MD  metFORMIN (GLUCOPHAGE) 1000 MG tablet Take 1 tablet (1,000 mg total) by mouth 2 (two) times daily with a meal. 07/27/14  Yes Susy Frizzle, MD  metoprolol tartrate (LOPRESSOR) 25 MG tablet TAKE 1 TABLET (25 MG TOTAL) BY MOUTH 2 (TWO) TIMES DAILY. 07/08/14  Yes [name], MD  Multiple Vitamin (MULTIVITAMIN) capsule Take 1 capsule by mouth daily.     Yes Historical Provider, MD  nitroGLYCERIN (NITROSTAT) 0.4 MG SL tablet Place 1 tablet (0.4 mg total) under the tongue every 5 (five) minutes x 3 doses as needed for chest pain. 06/17/13  Yes Rhonda G Barrett, PA-C  Omega-3 Fatty Acids (FISH OIL) 1000 MG CAPS Take 1 capsule by mouth daily.     Yes Historical Provider, MD  omeprazole (PRILOSEC) 20 MG capsule Take 20 mg by mouth daily.   Yes Historical Provider, MD  simvastatin (ZOCOR) 20 MG tablet TAKE 1 TABLET (20 MG TOTAL) BY MOUTH AT BEDTIME. 10/21/14  Yes Susy Frizzle, MD  traMADol (ULTRAM) 50 MG tablet Take 1 tablet (50 mg total) by mouth every 6 (six) hours as needed for moderate pain. 01/12/14  Yes Leone Haven, MD   Allergies  Allergen Reactions  . Neomycin  Other (See Comments)    Visual disturbance and swelling in eyes     Social History   Social History  . Marital Status: Married    Spouse Name: N/A  . Number of Children: 3  . Years of Education: N/A   Occupational History  . Systems analyst    Social History Main Topics  . Smoking status: Former Smoker -- 1.50 packs/day for 20 years    Types: Cigarettes    Quit date: 02/13/1986  . Smokeless tobacco: Never Used  . Alcohol Use: No  . Drug Use: No  . Sexual Activity: Yes   Other Topics Concern  . None   Social History Narrative   Lives in [place] with her husband. 3 children.  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   Former 30 Pk/yr Smoker (1.5 PPD x 20 yr) - quit in 1998.   Family History  Problem Relation Age of  Onset  . Asthma Maternal Grandmother   . Heart disease Mother     developed coronary dzs in her 69's.  Marland Kitchen Heart disease Maternal Grandmother   . Heart disease Maternal Grandfather   . Clotting disorder Mother   . Diabetes Brother   . Diabetes Paternal Aunt   . CAD Brother     s/p cabg in his 9's  . CAD Brother     s/p cabg in his 53's.    Wt Readings from Last 3 Encounters:  10/23/14 171 lb 9.6 oz (77.837 kg)  07/27/14 170 lb (77.111 kg)  03/09/14 173 lb 8 oz (78.699 kg)    PHYSICAL EXAM BP 128/78 mmHg  Pulse 68  Ht 5\' 1"  (1.549 m)  Wt 171 lb 9.6 oz (77.837 kg)  BMI 32.44 kg/m2 General appearance: alert, cooperative, appears stated age, no distress, mildly obese and Pleasant mood and affect.  Neck: no adenopathy, no carotid bruit, no JVD and supple, symmetrical, trachea midline Lungs: Non-labored with mild diffuse crackles that clear with cough. No significant wheezing or rales. She does have prolonged expiratory phase Heart: regular rate and rhythm, S1, S2 normal, no murmur, click, rub or gallop and normal apical impulse Abdomen: soft, non-tender; bowel sounds normal; no masses, no organomegaly Extremities: extremities normal, atraumatic, no cyanosis  or edema Pulses: 2+ and symmetric Neurologic: Grossly normal    Adult ECG Report  Rate: 68 ;  Rhythm: normal sinus rhythm and Septal Q waves concern for septal infarct, age undetermined. Nonspecific ST and T wave changes.;   Narrative Interpretation: stable EKG with otherwise normal axis and intervals and durations. No change from prior EKG.   Other studies Reviewed: Additional studies/ records that were reviewed today include:  Recent Labs:  Checked by PCP in June-July -- was told they are "doing well"    ASSESSMENT / PLAN: Problem List Items Addressed This Visit    Atherosclerotic heart disease of native coronary artery without angina pectoris (Chronic)    Interestingly, since her unstable angina event, she has not had any significant anginal symptoms. She does have quite significant disease a tortuous vessel which makes it a poor PCI target. Plan:  Continue beta blocker and Imdur along with when necessary nitroglycerin.  Continue aspirin or statin.  Continue to stay active with exercise and monitor for any worsening symptoms.   The only revascularization options discussed would probably be single vessel CABG which would try to avoid.      Relevant Orders   EKG 12-Lead   Essential hypertension - Primary (Chronic)    Well-controlled on current medications.      Relevant Orders   EKG 12-Lead   Hyperlipidemia with target LDL less than 70 (Chronic)    I have not seen any recent labs on her. Monitored by PCP. She is on simvastatin and omega-3 acids. Was told that her labs looked good in the past. Need to ensure that she is close to the goal of LDL less than 70.      Relevant Orders   EKG 12-Lead   Obesity (BMI 30-39.9) (Chronic)    The patient understands the need to lose weight with diet and exercise. We have discussed specific strategies for this.      OSA treated with BiPAP (Chronic)    Monitored by pulmonary medicine. On BiPAP.         Current medicines are  reviewed at length with the patient today. (+/- concerns) takes ASA in AM & Advil in PM  with Imdur  Studies Ordered:   Orders Placed This Encounter  Procedures  . EKG 12-Lead     PATIENT INSTRUCTIONS TAKE ASPIRIN  AT NIGHT BEFORE ISOSORBIDE MONO (IMDUR ) DOSE. IF YOU USE ADVIL OR MOTRIN EARLIER PART OF THE DAY ,THEN USE ADVIL OR MOTRIN THAT NIGHT BEFORE ISOSORBIDE DOSE.  NO OTHER CHANGES  Your physician wants you to follow-up in [place] [address] [place].    [name], M.D., M.S. Interventional Cardiologist   Pager # [phone]

## 2014-10-23 NOTE — Patient Instructions (Signed)
TAKE ASPIRIN  AT NIGHT BEFORE ISOSORBIDE MONO (IMDUR ) DOSE. IF YOU USE ADVIL OR MOTRIN EARLIER PART OF THE DAY ,THEN USE ADVIL OR MOTRIN THAT NIGHT BEFORE ISOSORBIDE DOSE.  NO OTHER CHANGES  Your physician wants you to follow-up in [place] [address] [place].  You will receive a reminder letter in the mail two months in advance. If you don't receive a letter, please call our office to schedule the follow-up appointment.

## 2014-10-25 NOTE — Assessment & Plan Note (Signed)
Interestingly, since her unstable angina event, she has not had any significant anginal symptoms. She does have quite significant disease a tortuous vessel which makes it a poor PCI target. Plan:  Continue beta blocker and Imdur along with when necessary nitroglycerin.  Continue aspirin or statin.  Continue to stay active with exercise and monitor for any worsening symptoms.   The only revascularization options discussed would probably be single vessel CABG which would try to avoid.

## 2014-10-25 NOTE — Assessment & Plan Note (Signed)
Well-controlled on current medications 

## 2014-10-25 NOTE — Assessment & Plan Note (Signed)
Monitored by pulmonary medicine. On BiPAP.

## 2014-10-25 NOTE — Assessment & Plan Note (Signed)
I have not seen any recent labs on her. Monitored by PCP. She is on simvastatin and omega-3 acids. Was told that her labs looked good in the past. Need to ensure that she is close to the goal of LDL less than 70.

## 2014-10-25 NOTE — Assessment & Plan Note (Signed)
The patient understands the need to lose weight with diet and exercise. We have discussed specific strategies for this.  

## 2014-10-31 ENCOUNTER — Other Ambulatory Visit: Payer: Self-pay | Admitting: Family Medicine

## 2014-11-02 ENCOUNTER — Other Ambulatory Visit: Payer: Self-pay | Admitting: Pulmonary Disease

## 2014-11-02 DIAGNOSIS — G4733 Obstructive sleep apnea (adult) (pediatric): Secondary | ICD-10-CM

## 2014-11-03 ENCOUNTER — Encounter: Payer: Self-pay | Admitting: Pulmonary Disease

## 2014-11-03 ENCOUNTER — Ambulatory Visit (INDEPENDENT_AMBULATORY_CARE_PROVIDER_SITE_OTHER): Payer: Medicare Other | Admitting: Pulmonary Disease

## 2014-11-03 VITALS — BP 120/78 | HR 68 | Ht 61.0 in | Wt 171.2 lb

## 2014-11-03 DIAGNOSIS — Z23 Encounter for immunization: Secondary | ICD-10-CM

## 2014-11-03 DIAGNOSIS — G4733 Obstructive sleep apnea (adult) (pediatric): Secondary | ICD-10-CM

## 2014-11-03 DIAGNOSIS — J398 Other specified diseases of upper respiratory tract: Secondary | ICD-10-CM | POA: Diagnosis not present

## 2014-11-03 NOTE — Assessment & Plan Note (Signed)
Flu shot Stay on bipap during sleep

## 2014-11-03 NOTE — Progress Notes (Signed)
   Subjective:    Patient ID: April [name], female    DOB: 1940/09/28, 74 y.o.   MRN: [id number]  HPI  74 year old former -smoker with an extensive workup for dyspnea which was finally attributed to tracheobroncho- malacia. Improved with BiPAP. She also has large Hiatal hernia.   11/03/2014  Chief Complaint  Patient presents with  . Follow-up    Patient doing well.  no concerns. Wears CPAP every night, approx 7 - 8 hours nightly.  Pt will take machine to be downloaded tomorrow; Flu shot   Annual FU  Daytime Oxygen was dc'd 2013 08/2013 - ONO on BiPAP and room air showed 52 minute desaturation. OK to stay off oxygen>> she & husband felt that she slept better with O2 Continues to work  Orthopnea persists, cannot lie down without bipap -Pt uses BIPAP and O2 everynight. Denies any problems with machine. She is able to spend a few nights without oxygen when  he travels  Significant tests/ events :  HRCT 03/2007 & 05/2008 did not show any evidence of pulmonary fibrosis . Large hiatal hernia was noted.  PFT initially showed intraparenchymal restriction with a diffuse capacity of 49%. PFTs 09/2007 >> marked improvment, FEV1% was 78, FEV68 %, FVC 63%, TLC 67%, DLCO remains 51%.  PSG  did reveal mild obstructive sleep apnea. On BiPAP +10/5 & is able to sleep supine.  Echo- no rt--> LT shunt   Feb, 2012 - Hoarseness resolved - lisinopril stopped, neg ENt evaluation April [name]) Does have occasional GERD.  06/17/2010 -ONO on bipap/ RA desaturation for about 6 mins -   08/22/2012 spirometry >> FVC 75% , mild restriction - improved slightly    Review of Systems neg for any significant sore throat, dysphagia, itching, sneezing, nasal congestion or excess/ purulent secretions, fever, chills, sweats, unintended wt loss, pleuritic or exertional cp, hempoptysis, orthopnea pnd or change in chronic leg swelling. Also denies presyncope, palpitations, heartburn, abdominal pain, nausea, vomiting, diarrhea or  change in bowel or urinary habits, dysuria,hematuria, rash, arthralgias, visual complaints, headache, numbness weakness or ataxia.     Objective:   Physical Exam  Gen. Pleasant, well-nourished, in no distress ENT - no lesions, no post nasal drip Neck: No JVD, no thyromegaly, no carotid bruits Lungs: no use of accessory muscles, no dullness to percussion, clear without rales or rhonchi  Cardiovascular: Rhythm regular, heart sounds  normal, no murmurs or gallops, no peripheral edema Musculoskeletal: No deformities, no cyanosis or clubbing         Assessment & Plan:

## 2014-11-03 NOTE — Patient Instructions (Signed)
Flu shot Stay on bipap during sleep OK to trial off oxygen & report back

## 2014-11-03 NOTE — Assessment & Plan Note (Signed)
Flu shot Stay on bipap during sleep OK to trial off oxygen & report back

## 2014-11-23 ENCOUNTER — Encounter: Payer: Self-pay | Admitting: Family Medicine

## 2014-11-23 ENCOUNTER — Ambulatory Visit (INDEPENDENT_AMBULATORY_CARE_PROVIDER_SITE_OTHER): Payer: Medicare Other | Admitting: Family Medicine

## 2014-11-23 VITALS — BP 112/80 | HR 78 | Temp 98.2°F | Resp 20 | Wt 172.0 lb

## 2014-11-23 DIAGNOSIS — Z23 Encounter for immunization: Secondary | ICD-10-CM | POA: Diagnosis not present

## 2014-11-23 DIAGNOSIS — E038 Other specified hypothyroidism: Secondary | ICD-10-CM

## 2014-11-23 DIAGNOSIS — E039 Hypothyroidism, unspecified: Secondary | ICD-10-CM

## 2014-11-23 DIAGNOSIS — E1143 Type 2 diabetes mellitus with diabetic autonomic (poly)neuropathy: Secondary | ICD-10-CM

## 2014-11-23 DIAGNOSIS — E1165 Type 2 diabetes mellitus with hyperglycemia: Secondary | ICD-10-CM | POA: Diagnosis not present

## 2014-11-23 DIAGNOSIS — IMO0002 Reserved for concepts with insufficient information to code with codable children: Secondary | ICD-10-CM

## 2014-11-23 NOTE — Progress Notes (Signed)
Subjective:    Patient ID: April [name], female    DOB: [birth year], 74 y.o.   MRN: [id number]  HPI  patient's blood sugars remain elevated. Fasting blood sugar ranges between 160 and 180. Due to her cardiac history, she is not a good candidate for Coumadin. She's not responding to metformin 1000 mg by mouth twice a day. She is already on glipizide extended release 5 mg a day. She denies any polyuria, polydipsia, or blurred vision Past Medical History  Diagnosis Date  . Abnormal weight gain   . Hyperlipidemia   . GERD (gastroesophageal reflux disease)   . Hypertension   . Diverticulosis   . History of stress test     a. 09/2006 nl Dobutamine Echo  . Tracheobronchomalacia     a. followed by [facility]  Marland Kitchen COPD (chronic obstructive pulmonary disease) ([place])   . On home oxygen therapy     "2L q hs; runs into my BIPAP" (06/13/2013)  . OSA (obstructive sleep apnea)     "BIPAP w/O2" (06/13/2013)  . NIDDM (non-insulin dependent diabetes mellitus)   . Arthritis     "joints" (06/13/2013)  . Coronary artery disease, non-occlusive 06/2013    Cardiac Cath: sharp Angle take-off of Large Dominant Cx: ~70-80% mid Cx bifurcation lesion (Lateral OM &  AVGCx-PL-PDA both with hairpin ostial & ~50% lesions) - Not PCI amenable due to vessel tortuosity; ~40-50% mid LAD;Ramus - no significnat diseaes; small non-dominant RCA   Past Surgical History  Procedure Laterality Date  . Tubal ligation    . Cardiac catheterization  06/2013    Cardiac Cath: sharp Angle take-off of Large Dominant Cx: ~70-80% mid Cx bifurcation lesion (Lateral OM &  AVGCx-PL-PDA both with hairpin ostial & ~50% lesions) - Not PCI amenable due to vessel tortuosity; ~40-50% mid LAD;Ramus - no significnat diseaes; small non-dominant RCA  . Transthoracic echocardiogram  06/17/2013    Normal LV size and function. EF 55-60% with no regional WMA. Grade 1 diastolic dysfunction. No significant valvular lesions  . Left heart catheterization with  coronary angiogram N/A 06/16/2013    Procedure: LEFT HEART CATHETERIZATION WITH CORONARY ANGIOGRAM;  Surgeon: [name], MD;  Location: [facility] CATH LAB;  Service: Cardiovascular;  Laterality: N/A;   Current Outpatient Prescriptions on File Prior to Visit  Medication Sig Dispense Refill  . aspirin 81 MG tablet Take 81 mg by mouth daily.      . Calcium Carbonate-Vitamin D (CALCIUM 600 + D PO) Take 1 tablet by mouth daily.      Marland Kitchen gabapentin (NEURONTIN) 600 MG tablet TAKE 1 TABLET 4 TIMES DAILY 360 tablet 3  . glipiZIDE (GLUCOTROL XL) 5 MG 24 hr tablet TAKE 1 TABLET (5 MG TOTAL) BY MOUTH DAILY. 30 tablet 5  . hydrochlorothiazide (MICROZIDE) 12.5 MG capsule TAKE 1 CAPSULE BY MOUTH DAILY 30 capsule 11  . isosorbide mononitrate (IMDUR) 30 MG 24 hr tablet TAKE 1 TABLET (30 MG TOTAL) BY MOUTH DAILY. 30 tablet 6  . metFORMIN (GLUCOPHAGE) 1000 MG tablet Take 1 tablet (1,000 mg total) by mouth 2 (two) times daily with a meal. 60 tablet 11  . metoprolol tartrate (LOPRESSOR) 25 MG tablet TAKE 1 TABLET (25 MG TOTAL) BY MOUTH 2 (TWO) TIMES DAILY. 60 tablet 6  . Multiple Vitamin (MULTIVITAMIN) capsule Take 1 capsule by mouth daily.      . nitroGLYCERIN (NITROSTAT) 0.4 MG SL tablet Place 1 tablet (0.4 mg total) under the tongue every 5 (five) minutes x 3 doses  as needed for chest pain. 25 tablet 12  . Omega-3 Fatty Acids (FISH OIL) 1000 MG CAPS Take 1 capsule by mouth daily.      Marland Kitchen omeprazole (PRILOSEC) 20 MG capsule Take 20 mg by mouth daily.    . simvastatin (ZOCOR) 20 MG tablet TAKE 1 TABLET (20 MG TOTAL) BY MOUTH AT BEDTIME. 90 tablet 3  . traMADol (ULTRAM) 50 MG tablet Take 1 tablet (50 mg total) by mouth every 6 (six) hours as needed for moderate pain. 20 tablet 0   No current facility-administered medications on file prior to visit.   Allergies  Allergen Reactions  . Neomycin Other (See Comments)    Visual disturbance and swelling in eyes   Social History   Social History  . Marital Status:  Married    Spouse Name: N/A  . Number of Children: 3  . Years of Education: N/A   Occupational History  . Systems analyst    Social History Main Topics  . Smoking status: Former Smoker -- 1.50 packs/day for 20 years    Types: Cigarettes    Quit date: 02/13/1986  . Smokeless tobacco: Never Used  . Alcohol Use: No  . Drug Use: No  . Sexual Activity: Yes   Other Topics Concern  . Not on file   Social History Narrative   Lives in [place] with her husband. 3 children.  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   Former 30 Pk/yr Smoker (1.5 PPD x 20 yr) - quit in 1998.      Review of Systems  All other systems reviewed and are negative.      Objective:   Physical Exam  Cardiovascular: Normal rate, regular rhythm and normal heart sounds.   Pulmonary/Chest: Effort normal and breath sounds normal. No respiratory distress. She has no wheezes. She has no rales.  Abdominal: Soft. Bowel sounds are normal.  Musculoskeletal: She exhibits no edema.  Vitals reviewed.         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   Check BMP to monitor kidney function along with hemoglobin A1c to confirm the patient's fasting blood sugars. Recheck TSH to monitor her subclinical hypothyroidism.

## 2014-11-24 LAB — COMPLETE METABOLIC PANEL WITH GFR
ALBUMIN: 3.9 g/dL (ref 3.6–5.1)
ALK PHOS: 43 U/L (ref 33–130)
ALT: 48 U/L — AB (ref 6–29)
AST: 53 U/L — ABNORMAL HIGH (ref 10–35)
BILIRUBIN TOTAL: 0.3 mg/dL (ref 0.2–1.2)
BUN: 22 mg/dL (ref 7–25)
CALCIUM: 9.5 mg/dL (ref 8.6–10.4)
CO2: 26 mmol/L (ref 20–31)
CREATININE: 0.7 mg/dL (ref 0.60–0.93)
Chloride: 102 mmol/L (ref 98–110)
GFR, EST NON AFRICAN AMERICAN: 87 mL/min (ref >=60)
Glucose, Bld: 140 mg/dL — ABNORMAL HIGH (ref 70–99)
Potassium: 4.9 mmol/L (ref 3.5–5.3)
Sodium: 138 mmol/L (ref 135–146)
TOTAL PROTEIN: 6.8 g/dL (ref 6.1–8.1)

## 2014-11-24 LAB — HEMOGLOBIN A1C
HEMOGLOBIN A1C: 7.2 % — AB (ref <5.7)
MEAN PLASMA GLUCOSE: 160 mg/dL — AB (ref <117)

## 2014-11-24 LAB — TSH: TSH: 2.381 u[IU]/mL (ref 0.350–4.500)

## 2014-12-14 ENCOUNTER — Encounter: Payer: Self-pay | Admitting: Family Medicine

## 2014-12-14 ENCOUNTER — Ambulatory Visit (INDEPENDENT_AMBULATORY_CARE_PROVIDER_SITE_OTHER): Payer: Medicare Other | Admitting: Family Medicine

## 2014-12-14 VITALS — BP 132/80 | HR 80 | Temp 98.1°F | Resp 16 | Ht 62.0 in | Wt 170.0 lb

## 2014-12-14 DIAGNOSIS — R197 Diarrhea, unspecified: Secondary | ICD-10-CM | POA: Diagnosis not present

## 2014-12-14 NOTE — Progress Notes (Signed)
Subjective:    Patient ID: April [name], female    DOB: 05/20/1940, 74 y.o.   MRN: [id number]  HPI 11/23/14  patient's blood sugars remain elevated. Fasting blood sugar ranges between 160 and 180. Due to her cardiac history, she is not a good candidate for Coumadin. She's not responding to metformin 1000 mg by mouth twice a day. She is already on glipizide extended release 5 mg a day. She denies any polyuria, polydipsia, or blurred vision.  AT that time, my plan was: Add invokana 300 mg poqam.   Check BMP to monitor kidney function along with hemoglobin A1c to confirm the patient's fasting blood sugars. Recheck TSH to monitor her subclinical hypothyroidism.  12/14/14 Since starting the invokana, patient states that the diarrhea she previously had on metformin has intensified. She always feels nauseated. She has copious watery diarrhea almost on a daily basis for the last 3 weeks. She reports intestinal cramps. Generally she just feels poorly since we started that medication. She denies any fevers or chills. She denies any blood in her stool. She denies any black tarry stool. She denies any vomiting or hematemesis. She has lost 2 pounds. She denies any sick contacts Past Medical History  Diagnosis Date  . Abnormal weight gain   . Hyperlipidemia   . GERD (gastroesophageal reflux disease)   . Hypertension   . Diverticulosis   . History of stress test     a. 09/2006 nl Dobutamine Echo  . Tracheobronchomalacia     a. followed by [facility].  Marland Kitchen COPD (chronic obstructive pulmonary disease) ([place])   . On home oxygen therapy     "2L q hs; runs into my BIPAP" (06/13/2013)  . OSA (obstructive sleep apnea)     "BIPAP w/O2" (06/13/2013)  . NIDDM (non-insulin dependent diabetes mellitus)   . Arthritis     "joints" (06/13/2013)  . Coronary artery disease, non-occlusive 06/2013    Cardiac Cath: sharp Angle take-off of Large Dominant Cx: ~70-80% mid Cx bifurcation lesion (Lateral OM &  AVGCx-PL-PDA both  with hairpin ostial & ~50% lesions) - Not PCI amenable due to vessel tortuosity; ~40-50% mid LAD;Ramus - no significnat diseaes; small non-dominant RCA   Past Surgical History  Procedure Laterality Date  . Tubal ligation    . Cardiac catheterization  06/2013    Cardiac Cath: sharp Angle take-off of Large Dominant Cx: ~70-80% mid Cx bifurcation lesion (Lateral OM &  AVGCx-PL-PDA both with hairpin ostial & ~50% lesions) - Not PCI amenable due to vessel tortuosity; ~40-50% mid LAD;Ramus - no significnat diseaes; small non-dominant RCA  . Transthoracic echocardiogram  06/17/2013    Normal LV size and function. EF 55-60% with no regional WMA. Grade 1 diastolic dysfunction. No significant valvular lesions  . Left heart catheterization with coronary angiogram N/A 06/16/2013    Procedure: LEFT HEART CATHETERIZATION WITH CORONARY ANGIOGRAM;  Surgeon: [name], MD;  Location: [facility] CATH LAB;  Service: Cardiovascular;  Laterality: N/A;   Current Outpatient Prescriptions on File Prior to Visit  Medication Sig Dispense Refill  . aspirin 81 MG tablet Take 81 mg by mouth daily.      . Calcium Carbonate-Vitamin D (CALCIUM 600 + D PO) Take 1 tablet by mouth daily.      Marland Kitchen gabapentin (NEURONTIN) 600 MG tablet TAKE 1 TABLET 4 TIMES DAILY 360 tablet 3  . glipiZIDE (GLUCOTROL XL) 5 MG 24 hr tablet TAKE 1 TABLET (5 MG TOTAL) BY MOUTH DAILY. 30 tablet 5  .  hydrochlorothiazide (MICROZIDE) 12.5 MG capsule TAKE 1 CAPSULE BY MOUTH DAILY 30 capsule 11  . isosorbide mononitrate (IMDUR) 30 MG 24 hr tablet TAKE 1 TABLET (30 MG TOTAL) BY MOUTH DAILY. 30 tablet 6  . metFORMIN (GLUCOPHAGE) 1000 MG tablet Take 1 tablet (1,000 mg total) by mouth 2 (two) times daily with a meal. 60 tablet 11  . metoprolol tartrate (LOPRESSOR) 25 MG tablet TAKE 1 TABLET (25 MG TOTAL) BY MOUTH 2 (TWO) TIMES DAILY. 60 tablet 6  . Multiple Vitamin (MULTIVITAMIN) capsule Take 1 capsule by mouth daily.      . nitroGLYCERIN (NITROSTAT) 0.4 MG SL tablet  Place 1 tablet (0.4 mg total) under the tongue every 5 (five) minutes x 3 doses as needed for chest pain. 25 tablet 12  . Omega-3 Fatty Acids (FISH OIL) 1000 MG CAPS Take 1 capsule by mouth daily.      Marland Kitchen omeprazole (PRILOSEC) 20 MG capsule Take 20 mg by mouth daily.    . simvastatin (ZOCOR) 20 MG tablet TAKE 1 TABLET (20 MG TOTAL) BY MOUTH AT BEDTIME. 90 tablet 3  . traMADol (ULTRAM) 50 MG tablet Take 1 tablet (50 mg total) by mouth every 6 (six) hours as needed for moderate pain. 20 tablet 0   No current facility-administered medications on file prior to visit.   Allergies  Allergen Reactions  . Neomycin Other (See Comments)    Visual disturbance and swelling in eyes   Social History   Social History  . Marital Status: Married    Spouse Name: N/A  . Number of Children: 3  . Years of Education: N/A   Occupational History  . Systems analyst    Social History Main Topics  . Smoking status: Former Smoker -- 1.50 packs/day for 20 years    Types: Cigarettes    Quit date: 02/13/1986  . Smokeless tobacco: Never Used  . Alcohol Use: No  . Drug Use: No  . Sexual Activity: Yes   Other Topics Concern  . Not on file   Social History Narrative   Lives in [place] with her husband. 3 children.  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   Former 30 Pk/yr Smoker (1.5 PPD x 20 yr) - quit in 1998.      Review of Systems  All other systems reviewed and are negative.      Objective:   Physical Exam  Cardiovascular: Normal rate, regular rhythm and normal heart sounds.   Pulmonary/Chest: Effort normal and breath sounds normal. No respiratory distress. She has no wheezes. She has no rales.  Abdominal: Soft. Bowel sounds are normal.  Musculoskeletal: She exhibits no edema.  Vitals reviewed.         Assessment & Plan:  Diarrhea, unspecified type I believe this is likely due to the metformin. Inflammatory bowel disease would be less likely. IBS would be less likely. Temporarily discontinue  metformin and invokana.  Start Tradjenta 5 mg poqday and recheck in 1 week or sooner if worse.  If better, resume metformin 500 bid and continue tradjenta.

## 2014-12-18 ENCOUNTER — Telehealth: Payer: Self-pay | Admitting: Family Medicine

## 2014-12-18 NOTE — Telephone Encounter (Signed)
Patient is calling to say that she is feeling better from her last visit, but her sugar is higher  Please call her at [phone]

## 2014-12-29 ENCOUNTER — Other Ambulatory Visit: Payer: Self-pay | Admitting: Cardiology

## 2014-12-30 NOTE — Telephone Encounter (Signed)
Rx has been sent to the pharmacy electronically. ° °

## 2014-12-31 NOTE — Telephone Encounter (Signed)
Spoke to pt today  Sugars in AM have been 183, 166, 172, 188, 180, 191, 162, 163,169, and 171 today.  Taking Glipizide and Trajenta only.  Please advise.

## 2014-12-31 NOTE — Telephone Encounter (Signed)
LMTCB

## 2015-01-01 NOTE — Telephone Encounter (Signed)
Pt called and told to restart Metformin but only 500 mg BID along with other medications.  Continue to monitor blood sugars.  Call next Friday with those readings unless has problem before that.

## 2015-01-01 NOTE — Telephone Encounter (Signed)
Per Dr. Antony Contras notes restart Metformin but take only 500mg  BID, she can take 1/2 of her 1000mg  tablet

## 2015-01-18 ENCOUNTER — Telehealth: Payer: Self-pay | Admitting: Family Medicine

## 2015-01-18 NOTE — Telephone Encounter (Signed)
Pt calling with BS readings as requested   11/19 - 165,  11/20 - 141, 11/21 - 141, 11/22 - 157, 11/23 - 139, 11/24 - 156, 11/25 - 157, 11/26 - 165, 11/27 - 166, 11/28 - 136, 11/29 - 149, 11/30 - 151, 12/1 - 152, 12/2 - 152, 12/3 - 142, 12/4 - 161 and 12/5 - 135.

## 2015-01-19 NOTE — Telephone Encounter (Signed)
Is the diarrhea better off the metformin and invokana?  If so, continue tradjenta and resume invokana as blood sugars are still too high.

## 2015-01-19 NOTE — Telephone Encounter (Signed)
Spoke to patient. Aware to resume Invokana and continue Tradjenta.  Samples left at front desk.

## 2015-01-19 NOTE — Telephone Encounter (Signed)
LMTCB

## 2015-01-31 ENCOUNTER — Other Ambulatory Visit: Payer: Self-pay | Admitting: Cardiology

## 2015-04-12 ENCOUNTER — Other Ambulatory Visit: Payer: Self-pay | Admitting: Family Medicine

## 2015-04-12 NOTE — Telephone Encounter (Signed)
Medication refilled per protocol. 

## 2015-05-03 ENCOUNTER — Telehealth: Payer: Self-pay | Admitting: *Deleted

## 2015-05-03 NOTE — Telephone Encounter (Signed)
Pt called requesting samples of INVOKANA 300mg  and Tradjenta 5mg    We DO NOT have samples of INVOKANA at this time, I advised pt I can call in invokana to her pharmacy and she stated that provider here told her she may not be able to afford it, I advised the pt that I can sent it to pharmacy and will the pharmacy calls she can ask about the price. Pt wants to know if there is something else she can take to avoid this until samples come in? Please advise!  Tradjenta samples are upfront ready for pick up  Loiza

## 2015-05-03 NOTE — Telephone Encounter (Signed)
Can take jardiance 25 mg poqday in place of invokana.

## 2015-05-03 NOTE — Telephone Encounter (Signed)
Pt aware and will pick up samples of Jardiance 25mg 

## 2015-06-04 ENCOUNTER — Telehealth: Payer: Self-pay | Admitting: Family Medicine

## 2015-06-04 NOTE — Telephone Encounter (Signed)
requesting samples for Jardiance 25mg   CB# [phone]

## 2015-06-11 ENCOUNTER — Ambulatory Visit (INDEPENDENT_AMBULATORY_CARE_PROVIDER_SITE_OTHER): Payer: Medicare Other | Admitting: Family Medicine

## 2015-06-11 ENCOUNTER — Encounter: Payer: Self-pay | Admitting: Family Medicine

## 2015-06-11 VITALS — BP 142/90 | HR 64 | Temp 97.8°F | Resp 16 | Ht 62.0 in | Wt 170.0 lb

## 2015-06-11 DIAGNOSIS — E1165 Type 2 diabetes mellitus with hyperglycemia: Secondary | ICD-10-CM

## 2015-06-11 DIAGNOSIS — IMO0001 Reserved for inherently not codable concepts without codable children: Secondary | ICD-10-CM

## 2015-06-11 LAB — LIPID PANEL
Cholesterol: 170 mg/dL (ref 125–200)
HDL: 47 mg/dL (ref >=46)
LDL CALC: 69 mg/dL (ref <130)
TRIGLYCERIDES: 268 mg/dL — AB (ref <150)
Total CHOL/HDL Ratio: 3.6 Ratio (ref <=5.0)
VLDL: 54 mg/dL — ABNORMAL HIGH (ref <30)

## 2015-06-11 LAB — COMPLETE METABOLIC PANEL WITH GFR
ALBUMIN: 4.2 g/dL (ref 3.6–5.1)
ALK PHOS: 41 U/L (ref 33–130)
ALT: 25 U/L (ref 6–29)
AST: 21 U/L (ref 10–35)
BILIRUBIN TOTAL: 0.5 mg/dL (ref 0.2–1.2)
BUN: 18 mg/dL (ref 7–25)
CALCIUM: 10.2 mg/dL (ref 8.6–10.4)
CO2: 34 mmol/L — AB (ref 20–31)
CREATININE: 0.69 mg/dL (ref 0.60–0.93)
Chloride: 98 mmol/L (ref 98–110)
GFR, EST NON AFRICAN AMERICAN: 87 mL/min (ref >=60)
Glucose, Bld: 174 mg/dL — ABNORMAL HIGH (ref 70–99)
Potassium: 4.9 mmol/L (ref 3.5–5.3)
Sodium: 139 mmol/L (ref 135–146)
TOTAL PROTEIN: 7.1 g/dL (ref 6.1–8.1)

## 2015-06-11 LAB — HEMOGLOBIN A1C
Hgb A1c MFr Bld: 7.2 % — ABNORMAL HIGH (ref <5.7)
Mean Plasma Glucose: 160 mg/dL

## 2015-06-11 MED ORDER — PIOGLITAZONE HCL 30 MG PO TABS: 30.0000 mg | ORAL_TABLET | Freq: Every day | ORAL | Status: DC

## 2015-06-11 MED ORDER — METFORMIN HCL 500 MG PO TABS: 500.0000 mg | ORAL_TABLET | Freq: Two times a day (BID) | ORAL | Status: DC

## 2015-06-11 NOTE — Progress Notes (Signed)
Subjective:    Patient ID: April [name], female    DOB: 1940/11/25, 75 y.o.   MRN: [id number]  HPI Patient has diabetes mellitus type 2. Her fasting blood sugars are averaging between 140 and 160. She is currently on glipizide, jardiance, and tradjenta.  She has not seen improvement on the to name brand medications. We discontinued metformin in the past due to diarrhea. She denies any hypoglycemic episodes. She denies any chest pain shortness of breath or dyspnea on exertion. She denies any blurry vision pmh Past Surgical History  Procedure Laterality Date  . Tubal ligation    . Cardiac catheterization  06/2013    Cardiac Cath: sharp Angle take-off of Large Dominant Cx: ~70-80% mid Cx bifurcation lesion (Lateral OM &  AVGCx-PL-PDA both with hairpin ostial & ~50% lesions) - Not PCI amenable due to vessel tortuosity; ~40-50% mid LAD;Ramus - no significnat diseaes; small non-dominant RCA  . Transthoracic echocardiogram  06/17/2013    Normal LV size and function. EF 55-60% with no regional WMA. Grade 1 diastolic dysfunction. No significant valvular lesions  . Left heart catheterization with coronary angiogram N/A 06/16/2013    Procedure: LEFT HEART CATHETERIZATION WITH CORONARY ANGIOGRAM;  Surgeon: [name], MD;  Location: [facility] CATH LAB;  Service: Cardiovascular;  Laterality: N/A;   Current Outpatient Prescriptions on File Prior to Visit  Medication Sig Dispense Refill  . aspirin 81 MG tablet Take 81 mg by mouth daily.      . Calcium Carbonate-Vitamin D (CALCIUM 600 + D PO) Take 1 tablet by mouth daily.      Marland Kitchen gabapentin (NEURONTIN) 600 MG tablet TAKE 1 TABLET BY MOUTH 4 TIMES A DAY 360 tablet 0  . glipiZIDE (GLUCOTROL XL) 5 MG 24 hr tablet TAKE 1 TABLET (5 MG TOTAL) BY MOUTH DAILY. 30 tablet 5  . hydrochlorothiazide (MICROZIDE) 12.5 MG capsule TAKE 1 CAPSULE BY MOUTH DAILY 30 capsule 11  . isosorbide mononitrate (IMDUR) 30 MG 24 hr tablet TAKE 1 TABLET (30 MG TOTAL) BY MOUTH DAILY. 30  tablet 9  . linagliptin (TRADJENTA) 5 MG TABS tablet Take 5 mg by mouth daily.    . metoprolol tartrate (LOPRESSOR) 25 MG tablet TAKE 1 TABLET TWICE A DAY 60 tablet 6  . Multiple Vitamin (MULTIVITAMIN) capsule Take 1 capsule by mouth daily.      . nitroGLYCERIN (NITROSTAT) 0.4 MG SL tablet Place 1 tablet (0.4 mg total) under the tongue every 5 (five) minutes x 3 doses as needed for chest pain. 25 tablet 12  . Omega-3 Fatty Acids (FISH OIL) 1000 MG CAPS Take 1 capsule by mouth daily.      Marland Kitchen omeprazole (PRILOSEC) 20 MG capsule Take 20 mg by mouth daily.    . simvastatin (ZOCOR) 20 MG tablet TAKE 1 TABLET (20 MG TOTAL) BY MOUTH AT BEDTIME. 90 tablet 3  . traMADol (ULTRAM) 50 MG tablet Take 1 tablet (50 mg total) by mouth every 6 (six) hours as needed for moderate pain. 20 tablet 0   No current facility-administered medications on file prior to visit.   Allergies  Allergen Reactions  . Neomycin Other (See Comments)    Visual disturbance and swelling in eyes   Social History   Social History  . Marital Status: Married    Spouse Name: N/A  . Number of Children: 3  . Years of Education: N/A   Occupational History  . Systems analyst    Social History Main Topics  . Smoking status: Former  Smoker -- 1.50 packs/day for 20 years    Types: Cigarettes    Quit date: 02/13/1986  . Smokeless tobacco: Never Used  . Alcohol Use: No  . Drug Use: No  . Sexual Activity: Yes   Other Topics Concern  . Not on file   Social History Narrative   Lives in [place] with her husband. 3 children.  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   Former 30 Pk/yr Smoker (1.5 PPD x 20 yr) - quit in 1998.      Review of Systems  All other systems reviewed and are negative.      Objective:   Physical Exam  Neck: Neck supple.  Cardiovascular: Normal rate, regular rhythm and normal heart sounds.   Pulmonary/Chest: Effort normal and breath sounds normal. No respiratory distress. She has no wheezes. She has no rales.   Abdominal: Soft. Bowel sounds are normal. She exhibits no distension. There is no tenderness. There is no rebound and no guarding.  Musculoskeletal: She exhibits no edema.  Lymphadenopathy:    She has no cervical adenopathy.  Vitals reviewed.         Assessment & Plan:  Uncontrolled type 2 diabetes mellitus without complication, without long-term current use of insulin ([place]) - Plan: metFORMIN (GLUCOPHAGE) 500 MG tablet, pioglitazone (ACTOS) 30 MG tablet, COMPLETE METABOLIC PANEL WITH GFR, Lipid panel, Hemoglobin A1c, Microalbumin, urine  Discontinue Tradjenta and jardiance due to cost.  Replace with metformin 500 mg by mouth twice a day and Actos 30 mg by mouth daily. Recheck fasting blood sugars in 6 weeks. May need to try at least one name brand medication his fasting blood sugars are not less than 130 in 6 weeks

## 2015-06-11 NOTE — Telephone Encounter (Signed)
Changed meds at ov today

## 2015-06-12 LAB — MICROALBUMIN, URINE: Microalb, Ur: 0.9 mg/dL

## 2015-06-28 DIAGNOSIS — E119 Type 2 diabetes mellitus without complications: Secondary | ICD-10-CM | POA: Diagnosis not present

## 2015-08-02 ENCOUNTER — Other Ambulatory Visit: Payer: Self-pay | Admitting: Cardiology

## 2015-08-02 ENCOUNTER — Other Ambulatory Visit: Payer: Self-pay | Admitting: Family Medicine

## 2015-08-03 ENCOUNTER — Other Ambulatory Visit: Payer: Self-pay | Admitting: *Deleted

## 2015-08-03 MED ORDER — METOPROLOL TARTRATE 25 MG PO TABS: 25.0000 mg | ORAL_TABLET | Freq: Two times a day (BID) | ORAL | Status: DC

## 2015-08-03 NOTE — Telephone Encounter (Signed)
Refill appropriate and filled per protocol. 

## 2015-08-10 ENCOUNTER — Other Ambulatory Visit: Payer: Self-pay | Admitting: Family Medicine

## 2015-08-10 NOTE — Telephone Encounter (Signed)
Refill appropriate and filled per protocol. 

## 2015-10-04 ENCOUNTER — Other Ambulatory Visit: Payer: Self-pay | Admitting: Family Medicine

## 2015-10-04 NOTE — Telephone Encounter (Signed)
Refill appropriate and filled per protocol. 

## 2015-10-19 ENCOUNTER — Other Ambulatory Visit: Payer: Self-pay | Admitting: Family Medicine

## 2015-10-25 ENCOUNTER — Encounter: Payer: Self-pay | Admitting: Cardiology

## 2015-10-25 ENCOUNTER — Ambulatory Visit (INDEPENDENT_AMBULATORY_CARE_PROVIDER_SITE_OTHER): Payer: Medicare Other | Admitting: Cardiology

## 2015-10-25 VITALS — BP 110/66 | HR 61 | Ht 61.0 in | Wt 180.2 lb

## 2015-10-25 DIAGNOSIS — I2510 Atherosclerotic heart disease of native coronary artery without angina pectoris: Secondary | ICD-10-CM

## 2015-10-25 DIAGNOSIS — E785 Hyperlipidemia, unspecified: Secondary | ICD-10-CM | POA: Diagnosis not present

## 2015-10-25 DIAGNOSIS — I200 Unstable angina: Secondary | ICD-10-CM

## 2015-10-25 DIAGNOSIS — I10 Essential (primary) hypertension: Secondary | ICD-10-CM

## 2015-10-25 DIAGNOSIS — E669 Obesity, unspecified: Secondary | ICD-10-CM | POA: Diagnosis not present

## 2015-10-25 NOTE — Patient Instructions (Signed)
Continue same medications   Try and exercise regularly   Your physician wants you to follow-up in: 1 year. You will receive a reminder letter in the mail two months in advance. If you don't receive a letter, please call our office to schedule the follow-up appointment.

## 2015-10-25 NOTE — Progress Notes (Signed)
PCP: Odette Fraction, MD  Clinic Note: Chief Complaint  Patient presents with  . Follow-up    CAD history  . Shortness of Breath    Mostly related to COPD    HPI: April [name] is a 75 y.o. female with a PMH below who presents today for ~6-8 month f/u of CAD.  Her most recent cardiac episode was in early May a 2015 --> Cardiac catheterization revealing single vessel severe disease in extremely tortuous dominant circumflex that was not thought to be safe PCI. I reviewed the images with Dr. Burt Knack we both felt that it would be unsafe to attempt PCI on this lesion --> Med Rx recommended.  She does have baseline COPD which limits her activity level at baseline anyway with underlying dyspnea.  She was started on beta blocker and Imdur, but her PCP reduced the beta blocker from 25 twice a day to 12.5 twice a day because of headache.  April [name] was last seen in Sept 2016 - was doing well with no CAD complaints.  Recent Hospitalizations: n/a  Studies Reviewed: n/a  Interval History: She presents today we will any major complaints. She is not overly Active, being somewhat limited by her COPD with baseline dyspnea. No changes to her baseline. No worsening exertional dyspnea - just can't do things as fast or strenuous as she used to. No resting dyspnea. No resting or exertional chest tightness or pressure to suggest angina. She probably will have to stop before reaching the top of flight of steps. This doesn't seem to be a new finding for her. She says that she's concerned because her diabetes medication had her put on weight. She is due to have labs checked by her PCP soon.  No PND, orthopnea or edema. -- sleeps with BiPAP  No palpitations, lightheadedness, dizziness, weakness or syncope/near syncope. No TIA/amaurosis fugax symptoms. No melena, hematochezia, hematuria, or epstaxis. No claudication.  Past Medical History:  Diagnosis Date  . Abnormal weight gain   . Arthritis    "joints" (06/13/2013)  . COPD (chronic obstructive pulmonary disease) ([place])   . Coronary artery disease, non-occlusive 06/2013   Cardiac Cath: sharp Angle take-off of Large Dominant Cx: ~70-80% mid Cx bifurcation lesion (Lateral OM &  AVGCx-PL-PDA both with hairpin ostial & ~50% lesions) - Not PCI amenable due to vessel tortuosity; ~40-50% mid LAD;Ramus - no significnat diseaes; small non-dominant RCA  . Diverticulosis   . GERD (gastroesophageal reflux disease)   . History of stress test    a. 09/2006 nl Dobutamine Echo  . Hyperlipidemia   . Hypertension   . NIDDM (non-insulin dependent diabetes mellitus)   . On home oxygen therapy    "2L q hs; runs into my BIPAP" (06/13/2013)  . OSA (obstructive sleep apnea)    "BIPAP w/O2" (06/13/2013)  . Tracheobronchomalacia    a. followed by [facility].    Past Surgical History:  Procedure Laterality Date  . CARDIAC CATHETERIZATION  06/2013   Cardiac Cath: sharp Angle take-off of Large Dominant Cx: ~70-80% mid Cx bifurcation lesion (Lateral OM &  AVGCx-PL-PDA both with hairpin ostial & ~50% lesions) - Not PCI amenable due to vessel tortuosity; ~40-50% mid LAD;Ramus - no significnat diseaes; small non-dominant RCA  . LEFT HEART CATHETERIZATION WITH CORONARY ANGIOGRAM N/A 06/16/2013   Procedure: LEFT HEART CATHETERIZATION WITH CORONARY ANGIOGRAM;  Surgeon: [name], MD;  Location: [facility] CATH LAB;  Service: Cardiovascular;  Laterality: N/A;  . TRANSTHORACIC ECHOCARDIOGRAM  06/17/2013  Normal LV size and function. EF 55-60% with no regional WMA. Grade 1 diastolic dysfunction. No significant valvular lesions  . TUBAL LIGATION      ROS: A comprehensive was performed. Review of Systems  Constitutional: Negative for malaise/fatigue.       Generally feels somewhat tired and weak. Nothing new.  HENT: Negative for nosebleeds.   Eyes: Negative for blurred vision.  Respiratory: Positive for cough (Chronic cough that makes her somewhat SOB -- will cough if  lies down without CPAP), shortness of breath and wheezing.   Cardiovascular: Negative for claudication and leg swelling.  Gastrointestinal: Negative for blood in stool and melena.  Genitourinary: Negative for hematuria.  Musculoskeletal: Positive for joint pain (nothing out of ordinary OA Sx). Negative for myalgias.  Neurological: Positive for dizziness (Occasional positional dizziness). Negative for headaches.  Endo/Heme/Allergies: Bruises/bleeds easily.  Psychiatric/Behavioral: Negative for depression and memory loss. The patient is not nervous/anxious and does not have insomnia.        Noticeably less anxious than last visit  All other systems reviewed and are negative.   Prior to Admission medications   Medication Sig Start Date End Date Taking? Authorizing Provider  aspirin 81 MG tablet Take 81 mg by mouth daily.     Yes Historical Provider, MD  Calcium Carbonate-Vitamin D (CALCIUM 600 + D PO) Take 1 tablet by mouth daily.     Yes Historical Provider, MD  gabapentin (NEURONTIN) 600 MG tablet TAKE 1 TABLET 4 TIMES DAILY 03/25/14  Yes Susy Frizzle, MD  glipiZIDE (GLUCOTROL XL) 5 MG 24 hr tablet TAKE 1 TABLET (5 MG TOTAL) BY MOUTH DAILY. 08/25/14  Yes Susy Frizzle, MD  hydrochlorothiazide (MICROZIDE) 12.5 MG capsule TAKE 1 CAPSULE BY MOUTH DAILY 05/01/14  Yes Susy Frizzle, MD  isosorbide mononitrate (IMDUR) 30 MG 24 hr tablet TAKE 1 TABLET (30 MG TOTAL) BY MOUTH DAILY. 06/15/14  Yes [name], MD  metFORMIN (GLUCOPHAGE) 1000 MG tablet Take 1 tablet (1,000 mg total) by mouth 2 (two) times daily with a meal. 07/27/14  Yes Susy Frizzle, MD  metoprolol tartrate (LOPRESSOR) 25 MG tablet TAKE 1 TABLET (25 MG TOTAL) BY MOUTH 2 (TWO) TIMES DAILY. 07/08/14  Yes [name], MD  Multiple Vitamin (MULTIVITAMIN) capsule Take 1 capsule by mouth daily.     Yes Historical Provider, MD  nitroGLYCERIN (NITROSTAT) 0.4 MG SL tablet Place 1 tablet (0.4 mg total) under the tongue every 5  (five) minutes x 3 doses as needed for chest pain. 06/17/13  Yes Rhonda G Barrett, PA-C  Omega-3 Fatty Acids (FISH OIL) 1000 MG CAPS Take 1 capsule by mouth daily.     Yes Historical Provider, MD  omeprazole (PRILOSEC) 20 MG capsule Take 20 mg by mouth daily.   Yes Historical Provider, MD  simvastatin (ZOCOR) 20 MG tablet TAKE 1 TABLET (20 MG TOTAL) BY MOUTH AT BEDTIME. 10/21/14  Yes Susy Frizzle, MD  traMADol (ULTRAM) 50 MG tablet Take 1 tablet (50 mg total) by mouth every 6 (six) hours as needed for moderate pain. 01/12/14  Yes Leone Haven, MD   Allergies  Allergen Reactions  . Neomycin Other (See Comments)    Visual disturbance and swelling in eyes     Social History   Social History  . Marital status: Married    Spouse name: N/A  . Number of children: 3  . Years of education: N/A   Occupational History  . Systems analyst  Social History Main Topics  . Smoking status: Former Smoker    Packs/day: 1.50    Years: 20.00    Types: Cigarettes    Quit date: 02/13/1986  . Smokeless tobacco: Never Used  . Alcohol use No  . Drug use: No  . Sexual activity: Yes   Other Topics Concern  . None   Social History Narrative   Lives in [place] with her husband. 3 children.  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   Former 30 Pk/yr Smoker (1.5 PPD x 20 yr) - quit in 1998.   Family History  Problem Relation Age of Onset  . Asthma Maternal Grandmother   . Heart disease Maternal Grandmother   . Heart disease Mother     developed coronary dzs in her 59's.  . Clotting disorder Mother   . Heart disease Maternal Grandfather   . Diabetes Brother   . Diabetes Paternal Aunt   . CAD Brother     s/p cabg in his 42's  . CAD Brother     s/p cabg in his 83's.    Wt Readings from Last 3 Encounters:  10/25/15 180 lb 3.2 oz (81.7 kg)  06/11/15 170 lb (77.1 kg)  12/14/14 170 lb (77.1 kg)    PHYSICAL EXAM BP 110/66 (BP Location: Right Arm, Patient Position: Sitting, Cuff Size: Large)    Pulse 61   Ht 5\' 1"  (1.549 m)   Wt 180 lb 3.2 oz (81.7 kg)   BMI 34.05 kg/m  General appearance: alert, cooperative, appears stated age, no distress, mildly obese and Pleasant mood and affect.  HEENT: [place]/AT, EOMI, MMM, anicteric sclera Neck: no adenopathy, no carotid bruit, no JVD and supple, symmetrical, trachea midline Lungs: Non-labored with mild diffuse crackles that clear with cough. No significant wheezing or rales. She does have prolonged expiratory phase Heart: regular rate and rhythm, S1, S2 normal, no murmur, click, rub or gallop and normal apical impulse Abdomen: soft, non-tender; bowel sounds normal; no masses, no organomegaly Extremities: extremities normal, atraumatic, no cyanosis or edema Pulses: 2+ and symmetric Neurologic: Grossly normal    Adult ECG Report  Rate: 61;  Rhythm: normal sinus rhythm and Septal Q waves concern for septal infarct, age undetermined. Nonspecific ST and T wave changes.;   Narrative Interpretation: stable EKG with otherwise normal axis and intervals and durations. No change from prior EKG.   Other studies Reviewed: Additional studies/ records that were reviewed today include:  Recent Labs:  Checked by PCP in June-July -- was told they are "doing well"    ASSESSMENT / PLAN: Problem List Items Addressed This Visit    Unstable angina - history of (Chronic)    No notable symptoms. Well-controlled with beta blocker and Imdur. Although she has a significant lesion, doing well with medical management.      Obesity (BMI 30-39.9) (Chronic)    The patient understands the need to lose weight with diet and exercise. We have discussed specific strategies for this.      Hyperlipidemia with target LDL less than 70 (Chronic)    Labs are monitored by PCP. She said they're well controlled. Goal LDL should be less than 70 and HDL 40. She is on simvastatin 20 mg.      Essential hypertension - Primary (Chronic)    Excellent blood pressure control with  current regimen.      Relevant Orders   EKG 12-Lead   Atherosclerotic heart disease of native coronary artery without angina pectoris (Chronic)    She  has not really had any significant angina since her unstable angina event. She has a moderately severe lesion in the circumflex that was not PCI target. She is doing fine without any significant angina whatsoever to continue with the Imdur. She will continue taking the aspirin before it. She is also on statin and beta blocker. Stressed the importance of icing control. Continue to stress the  importance of continue to stay active with exercise in order to continue to monitor her angina symptoms. She should not be scared to exercise.       Other Visit Diagnoses   None.     Current medicines are reviewed at length with the patient today. (+/- concerns)  Studies Ordered:   Orders Placed This Encounter  Procedures  . EKG 12-Lead     PATIENT INSTRUCTIONS  NO CHANGESTo medications. Exercise regularly.  Your physician wants you to follow-up in [place].    Glenetta Hew, M.D., M.S. Interventional Cardiologist   Pager # [phone]

## 2015-10-27 ENCOUNTER — Encounter: Payer: Self-pay | Admitting: Cardiology

## 2015-10-27 NOTE — Assessment & Plan Note (Signed)
She has not really had any significant angina since her unstable angina event. She has a moderately severe lesion in the circumflex that was not PCI target. She is doing fine without any significant angina whatsoever to continue with the Imdur. She will continue taking the aspirin before it. She is also on statin and beta blocker. Stressed the importance of icing control. Continue to stress the  importance of continue to stay active with exercise in order to continue to monitor her angina symptoms. She should not be scared to exercise.

## 2015-10-27 NOTE — Assessment & Plan Note (Addendum)
Excellent blood pressure control with current regimen.

## 2015-10-27 NOTE — Assessment & Plan Note (Signed)
No notable symptoms. Well-controlled with beta blocker and Imdur. Although she has a significant lesion, doing well with medical management.

## 2015-10-27 NOTE — Assessment & Plan Note (Signed)
Labs are monitored by PCP. She said they're well controlled. Goal LDL should be less than 70 and HDL 40. She is on simvastatin 20 mg.

## 2015-10-27 NOTE — Assessment & Plan Note (Signed)
The patient understands the need to lose weight with diet and exercise. We have discussed specific strategies for this.  

## 2015-10-29 ENCOUNTER — Other Ambulatory Visit: Payer: Self-pay | Admitting: Cardiology

## 2015-10-29 NOTE — Telephone Encounter (Signed)
Rx request sent to pharmacy.  

## 2015-11-02 ENCOUNTER — Encounter: Payer: Self-pay | Admitting: Family Medicine

## 2015-11-02 ENCOUNTER — Ambulatory Visit (INDEPENDENT_AMBULATORY_CARE_PROVIDER_SITE_OTHER): Payer: Medicare Other | Admitting: Family Medicine

## 2015-11-02 VITALS — BP 118/74 | HR 66 | Temp 98.1°F | Resp 16 | Ht 62.0 in | Wt 179.0 lb

## 2015-11-02 DIAGNOSIS — E785 Hyperlipidemia, unspecified: Secondary | ICD-10-CM

## 2015-11-02 DIAGNOSIS — E038 Other specified hypothyroidism: Secondary | ICD-10-CM | POA: Diagnosis not present

## 2015-11-02 DIAGNOSIS — I10 Essential (primary) hypertension: Secondary | ICD-10-CM | POA: Diagnosis not present

## 2015-11-02 DIAGNOSIS — E039 Hypothyroidism, unspecified: Secondary | ICD-10-CM

## 2015-11-02 DIAGNOSIS — E119 Type 2 diabetes mellitus without complications: Secondary | ICD-10-CM

## 2015-11-02 LAB — COMPLETE METABOLIC PANEL WITH GFR
ALBUMIN: 4.1 g/dL (ref 3.6–5.1)
ALK PHOS: 38 U/L (ref 33–130)
ALT: 19 U/L (ref 6–29)
AST: 18 U/L (ref 10–35)
BILIRUBIN TOTAL: 0.5 mg/dL (ref 0.2–1.2)
BUN: 13 mg/dL (ref 7–25)
CO2: 32 mmol/L — ABNORMAL HIGH (ref 20–31)
Calcium: 9.8 mg/dL (ref 8.6–10.4)
Chloride: 100 mmol/L (ref 98–110)
Creat: 0.6 mg/dL (ref 0.60–0.93)
GFR, Est African American: >89 mL/min (ref >=60)
GLUCOSE: 141 mg/dL — AB (ref 70–99)
Potassium: 5.2 mmol/L (ref 3.5–5.3)
SODIUM: 142 mmol/L (ref 135–146)
TOTAL PROTEIN: 6.8 g/dL (ref 6.1–8.1)

## 2015-11-02 LAB — CBC WITH DIFFERENTIAL/PLATELET
BASOS ABS: 0 {cells}/uL (ref 0–200)
Basophils Relative: 0 %
EOS ABS: 164 {cells}/uL (ref 15–500)
EOS PCT: 2 %
HEMATOCRIT: 43.7 % (ref 35.0–45.0)
HEMOGLOBIN: 14.5 g/dL (ref 12.0–15.0)
LYMPHS ABS: 2378 {cells}/uL (ref 850–3900)
Lymphocytes Relative: 29 %
MCH: 30.8 pg (ref 27.0–33.0)
MCHC: 33.2 g/dL (ref 32.0–36.0)
MCV: 92.8 fL (ref 80.0–100.0)
MONO ABS: 656 {cells}/uL (ref 200–950)
MPV: 10.7 fL (ref 7.5–12.5)
Monocytes Relative: 8 %
NEUTROS ABS: 5002 {cells}/uL (ref 1500–7800)
Neutrophils Relative %: 61 %
Platelets: 227 10*3/uL (ref 140–400)
RBC: 4.71 MIL/uL (ref 3.80–5.10)
RDW: 13.6 % (ref 11.0–15.0)
WBC: 8.2 10*3/uL (ref 3.8–10.8)

## 2015-11-02 LAB — LIPID PANEL
Cholesterol: 164 mg/dL (ref 125–200)
HDL: 58 mg/dL (ref >=46)
LDL Cholesterol: 65 mg/dL (ref <130)
Total CHOL/HDL Ratio: 2.8 Ratio (ref <=5.0)
Triglycerides: 204 mg/dL — ABNORMAL HIGH (ref <150)
VLDL: 41 mg/dL — ABNORMAL HIGH (ref <30)

## 2015-11-02 MED ORDER — LIRAGLUTIDE 18 MG/3ML ~~LOC~~ SOPN: 1.2000 mg | PEN_INJECTOR | Freq: Every day | SUBCUTANEOUS | 5 refills | Status: DC

## 2015-11-02 NOTE — Progress Notes (Signed)
Subjective:    Patient ID: April [name], female    DOB: 02-Nov-1940, 75 y.o.   MRN: [id number]  HPI  06/11/15 Patient has diabetes mellitus type 2. Her fasting blood sugars are averaging between 140 and 160. She is currently on glipizide, jardiance, and tradjenta.  She has not seen improvement on the to name brand medications. We discontinued metformin in the past due to diarrhea. She denies any hypoglycemic episodes. She denies any chest pain shortness of breath or dyspnea on exertion. She denies any blurry vision At that time, my plan was: Discontinue Tradjenta and jardiance due to cost.  Replace with metformin 500 mg by mouth twice a day and Actos 30 mg by mouth daily. Recheck fasting blood sugars in 6 weeks. May need to try at least one name brand medication his fasting blood sugars are not less than 130 in 6 weeks 11/02/15 Patient is here today for follow-up. She's gained substantial weight since discontinuing Jardiance in switching to Actos as one would expect. This is worsened her shortness of breath. She denies any polyuria or polydipsia, she denies blurred vision. She denies any chest pain. She does have chronic shortness of breath and chronic dyspnea on exertion. She is due receive her flu shot. She denies any myalgias or right upper quadrant pain. She is overdue for fasting lab work. Past Medical History:  Diagnosis Date  . Abnormal weight gain   . Arthritis    "joints" (06/13/2013)  . COPD (chronic obstructive pulmonary disease) ([place])   . Coronary artery disease, non-occlusive 06/2013   Cardiac Cath: sharp Angle take-off of Large Dominant Cx: ~70-80% mid Cx bifurcation lesion (Lateral OM &  AVGCx-PL-PDA both with hairpin ostial & ~50% lesions) - Not PCI amenable due to vessel tortuosity; ~40-50% mid LAD;Ramus - no significnat diseaes; small non-dominant RCA  . Diverticulosis   . GERD (gastroesophageal reflux disease)   . History of stress test    a. 09/2006 nl Dobutamine Echo  .  Hyperlipidemia   . Hypertension   . NIDDM (non-insulin dependent diabetes mellitus)   . On home oxygen therapy    "2L q hs; runs into my BIPAP" (06/13/2013)  . OSA (obstructive sleep apnea)    "BIPAP w/O2" (06/13/2013)  . Tracheobronchomalacia    a. followed by [facility].    Past Surgical History:  Procedure Laterality Date  . CARDIAC CATHETERIZATION  06/2013   Cardiac Cath: sharp Angle take-off of Large Dominant Cx: ~70-80% mid Cx bifurcation lesion (Lateral OM &  AVGCx-PL-PDA both with hairpin ostial & ~50% lesions) - Not PCI amenable due to vessel tortuosity; ~40-50% mid LAD;Ramus - no significnat diseaes; small non-dominant RCA  . LEFT HEART CATHETERIZATION WITH CORONARY ANGIOGRAM N/A 06/16/2013   Procedure: LEFT HEART CATHETERIZATION WITH CORONARY ANGIOGRAM;  Surgeon: [name], MD;  Location: [facility] CATH LAB;  Service: Cardiovascular;  Laterality: N/A;  . TRANSTHORACIC ECHOCARDIOGRAM  06/17/2013   Normal LV size and function. EF 55-60% with no regional WMA. Grade 1 diastolic dysfunction. No significant valvular lesions  . TUBAL LIGATION     Current Outpatient Prescriptions on File Prior to Visit  Medication Sig Dispense Refill  . aspirin 81 MG tablet Take 81 mg by mouth daily.      . Calcium Carbonate-Vitamin D (CALCIUM 600 + D PO) Take 1 tablet by mouth daily.      Marland Kitchen gabapentin (NEURONTIN) 600 MG tablet TAKE 1 TABLET BY MOUTH 4 TIMES A DAY 360 tablet 3  . glipiZIDE (  GLUCOTROL XL) 5 MG 24 hr tablet TAKE 1 TABLET BY MOUTH ONCE A DAY 30 tablet 3  . hydrochlorothiazide (MICROZIDE) 12.5 MG capsule TAKE 1 CAPSULE BY MOUTH ONCE DAILY 30 capsule 11  . isosorbide mononitrate (IMDUR) 30 MG 24 hr tablet TAKE 1 TABLET BY MOUTH ONCE A DAY 30 tablet 11  . metFORMIN (GLUCOPHAGE) 500 MG tablet Take 1 tablet (500 mg total) by mouth 2 (two) times daily with a meal. 180 tablet 3  . metoprolol tartrate (LOPRESSOR) 25 MG tablet Take 1 tablet (25 mg total) by mouth 2 (two) times daily. 60 tablet 11  .  Multiple Vitamin (MULTIVITAMIN) capsule Take 1 capsule by mouth daily.      . nitroGLYCERIN (NITROSTAT) 0.4 MG SL tablet Place 1 tablet (0.4 mg total) under the tongue every 5 (five) minutes x 3 doses as needed for chest pain. 25 tablet 12  . Omega-3 Fatty Acids (FISH OIL) 1000 MG CAPS Take 1 capsule by mouth daily.      Marland Kitchen omeprazole (PRILOSEC) 20 MG capsule Take 20 mg by mouth daily.    . pioglitazone (ACTOS) 30 MG tablet Take 1 tablet (30 mg total) by mouth daily. 30 tablet 5  . simvastatin (ZOCOR) 20 MG tablet TAKE 1 TABLET BY MOUTH EVERY NIGHT AT BEDTIME 90 tablet 3   No current facility-administered medications on file prior to visit.    Allergies  Allergen Reactions  . Neomycin Other (See Comments)    Visual disturbance and swelling in eyes   Social History   Social History  . Marital status: Married    Spouse name: N/A  . Number of children: 3  . Years of education: N/A   Occupational History  . Systems analyst    Social History Main Topics  . Smoking status: Former Smoker    Packs/day: 1.50    Years: 20.00    Types: Cigarettes    Quit date: 02/13/1986  . Smokeless tobacco: Never Used  . Alcohol use No  . Drug use: No  . Sexual activity: Yes   Other Topics Concern  . Not on file   Social History Narrative   Lives in [address] with her husband. 3 children.  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   Former 30 Pk/yr Smoker (1.5 PPD x 20 yr) - quit in 1998.      Review of Systems  All other systems reviewed and are negative.      Objective:   Physical Exam  Neck: Neck supple.  Cardiovascular: Normal rate, regular rhythm and normal heart sounds.   Pulmonary/Chest: Effort normal and breath sounds normal. No respiratory distress. She has no wheezes. She has no rales.  Abdominal: Soft. Bowel sounds are normal. She exhibits no distension. There is no tenderness. There is no rebound and no guarding.  Musculoskeletal: She exhibits no edema.  Lymphadenopathy:    She has no  cervical adenopathy.  Vitals reviewed.         Assessment & Plan:  Controlled type 2 diabetes mellitus without complication, without long-term current use of insulin (HCC) - Plan: Liraglutide 18 MG/3ML SOPN, CBC with Differential/Platelet, COMPLETE METABOLIC PANEL WITH GFR, Lipid panel, Hemoglobin A1c, Microalbumin, urine  Subclinical hypothyroidism  Essential hypertension  HLD (hyperlipidemia)  The patient's blood pressure today is well controlled. However I do believe that the medication changes we were forced to make in April due to cost have caused her weight gain. Therefore I commenced the patient to discontinue Actos even though is cheaper and  switch to visit toes of 1.2 mg daily patient received her flu shot today. I will also check a fasting lipid panel. Goal LDL cholesterol is less than 100. I'll also check a urine microalbumin. Diabetic eye exam is up-to-date

## 2015-11-03 LAB — MICROALBUMIN, URINE: MICROALB UR: 0.8 mg/dL

## 2015-11-03 LAB — HEMOGLOBIN A1C
HEMOGLOBIN A1C: 6.1 % — AB (ref <5.7)
MEAN PLASMA GLUCOSE: 128 mg/dL

## 2015-11-05 ENCOUNTER — Encounter: Payer: Self-pay | Admitting: Adult Health

## 2015-11-05 ENCOUNTER — Ambulatory Visit (INDEPENDENT_AMBULATORY_CARE_PROVIDER_SITE_OTHER): Payer: Medicare Other | Admitting: Adult Health

## 2015-11-05 VITALS — BP 122/70 | HR 79 | Temp 98.0°F | Ht 62.0 in | Wt 177.4 lb

## 2015-11-05 DIAGNOSIS — J398 Other specified diseases of upper respiratory tract: Secondary | ICD-10-CM | POA: Diagnosis not present

## 2015-11-05 DIAGNOSIS — G4733 Obstructive sleep apnea (adult) (pediatric): Secondary | ICD-10-CM

## 2015-11-05 NOTE — Progress Notes (Signed)
Subjective:    Patient ID: April [name], female    DOB: 1940-07-12, 75 y.o.   MRN: [id number]  HPI 75 year old female former smoker followed for mild OSA and Tracheobrochomalacia   TEST  Significant tests/ events :  HRCT 03/2007 & 05/2008 did not show any evidence of [name] fibrosis . Large hiatal hernia was noted.  PFT initially showed intraparenchymal restriction with a diffuse capacity of 49%. PFTs 09/2007 >> marked improvment, FEV1% was 78, FEV68 %, FVC 63%, TLC 67%, DLCO remains 51%.  PSG  did reveal mild obstructive sleep apnea. On BiPAP +10/5 & is able to sleep supine.  Echo- no rt--> LT shunt  Feb, 2012 - Hoarseness resolved - lisinopril stopped, neg ENt evaluation April [name]) Does have occasional GERD.  06/17/2010 -ONO on bipap/ RA desaturation for about 6 mins -   08/22/2012 spirometry >> FVC 75% , mild restriction - improved slightly   11/05/2015 Follow up OSA and Tracheobronchomalacia  She presents for a one-year follow-up for sleep apnea and Tracheobronchomalacia . She says she is doing well on BiPAP at bedtime.with O2 .  Has any flare of cough, shortness of breath, wheezing or significant daytime sleepiness. She does need order for mask and supplies. Unable to get download today. REquested download from DME.  Says she can not live without her machine.    Past Medical History:  Diagnosis Date  . Abnormal weight gain   . Arthritis    "joints" (06/13/2013)  . COPD (chronic obstructive [name] disease) ([place])   . Coronary artery disease, non-occlusive 06/2013   Cardiac Cath: sharp Angle take-off of Large Dominant Cx: ~70-80% mid Cx bifurcation lesion (Lateral OM &  AVGCx-PL-PDA both with hairpin ostial & ~50% lesions) - Not PCI amenable due to vessel tortuosity; ~40-50% mid LAD;Ramus - no significnat diseaes; small non-dominant RCA  . Diverticulosis   . GERD (gastroesophageal reflux disease)   . History of stress test    a. 09/2006 nl Dobutamine Echo  . Hyperlipidemia    . Hypertension   . NIDDM (non-insulin dependent diabetes mellitus)   . On home oxygen therapy    "2L q hs; runs into my BIPAP" (06/13/2013)  . OSA (obstructive sleep apnea)    "BIPAP w/O2" (06/13/2013)  . Tracheobronchomalacia    a. followed by [facility].   Current Outpatient Prescriptions on File Prior to Visit  Medication Sig Dispense Refill  . aspirin 81 MG tablet Take 81 mg by mouth daily.      . Calcium Carbonate-Vitamin D (CALCIUM 600 + D PO) Take 1 tablet by mouth daily.      Marland Kitchen gabapentin (NEURONTIN) 600 MG tablet TAKE 1 TABLET BY MOUTH 4 TIMES A DAY 360 tablet 3  . glipiZIDE (GLUCOTROL XL) 5 MG 24 hr tablet TAKE 1 TABLET BY MOUTH ONCE A DAY 30 tablet 3  . hydrochlorothiazide (MICROZIDE) 12.5 MG capsule TAKE 1 CAPSULE BY MOUTH ONCE DAILY 30 capsule 11  . isosorbide mononitrate (IMDUR) 30 MG 24 hr tablet TAKE 1 TABLET BY MOUTH ONCE A DAY 30 tablet 11  . Liraglutide 18 MG/3ML SOPN Inject 0.2 mLs (1.2 mg total) into the skin daily. 6 mL 5  . metFORMIN (GLUCOPHAGE) 500 MG tablet Take 1 tablet (500 mg total) by mouth 2 (two) times daily with a meal. 180 tablet 3  . metoprolol tartrate (LOPRESSOR) 25 MG tablet Take 1 tablet (25 mg total) by mouth 2 (two) times daily. 60 tablet 11  . Multiple Vitamin (MULTIVITAMIN) capsule Take 1  capsule by mouth daily.      . nitroGLYCERIN (NITROSTAT) 0.4 MG SL tablet Place 1 tablet (0.4 mg total) under the tongue every 5 (five) minutes x 3 doses as needed for chest pain. 25 tablet 12  . Omega-3 Fatty Acids (FISH OIL) 1000 MG CAPS Take 1 capsule by mouth daily.      Marland Kitchen omeprazole (PRILOSEC) 20 MG capsule Take 20 mg by mouth daily.    . simvastatin (ZOCOR) 20 MG tablet TAKE 1 TABLET BY MOUTH EVERY NIGHT AT BEDTIME 90 tablet 3  . pioglitazone (ACTOS) 30 MG tablet Take 1 tablet (30 mg total) by mouth daily. (Patient not taking: Reported on 11/05/2015) 30 tablet 5   No current facility-administered medications on file prior to visit.       Review of  Systems .Constitutional:   No  weight loss, night sweats,  Fevers, chills, fatigue, or  lassitude.  HEENT:   No headaches,  Difficulty swallowing,  Tooth/dental problems, or  Sore throat,                No sneezing, itching, ear ache, nasal congestion, post nasal drip,   CV:  No chest pain,  Orthopnea, PND, swelling in lower extremities, anasarca, dizziness, palpitations, syncope.   GI  No heartburn, indigestion, abdominal pain, nausea, vomiting, diarrhea, change in bowel habits, loss of appetite, bloody stools.   Resp: No shortness of breath with exertion or at rest.  No excess mucus, no productive cough,  No non-productive cough,  No coughing up of blood.  No change in color of mucus.  No wheezing.  No chest wall deformity  Skin: no rash or lesions.  GU: no dysuria, change in color of urine, no urgency or frequency.  No flank pain, no hematuria   MS:  No joint pain or swelling.  No decreased range of motion.  No back pain.  Psych:  No change in mood or affect. No depression or anxiety.  No memory loss.         Objective:   Physical Exam  Vitals:   11/05/15 1613  BP: 122/70  Pulse: 79  Temp: 98 F (36.7 C)  TempSrc: Oral  SpO2: 95%  Weight: 177 lb 6.4 oz (80.5 kg)  Height: 5\' 2"  (1.575 m)   GEN: A/Ox3; pleasant , NAD, elderly    HEENT:  April [name]/AT,  EACs-clear, TMs-wnl, NOSE-clear, THROAT-clear, no lesions, no postnasal drip or exudate noted. Class 2 MP   NECK:  Supple w/ fair ROM; no JVD; normal carotid impulses w/o bruits; no thyromegaly or nodules palpated; no lymphadenopathy.    RESP  Clear  P & A; w/o, wheezes/ rales/ or rhonchi. no accessory muscle use, no dullness to percussion  CARD:  RRR, no m/r/g  , no peripheral edema, pulses intact, no cyanosis or clubbing.  GI:   Soft & nt; nml bowel sounds; no organomegaly or masses detected.   Musco: Warm bil, no deformities or joint swelling noted.   Neuro: alert, no focal deficits noted.    Skin: Warm, no lesions or  rashes  April Silversmith NP-C  [name]  11/05/2015       Assessment & Plan:

## 2015-11-05 NOTE — Assessment & Plan Note (Signed)
Well controlled on BIPAP   Plan  Patient Instructions  Continue on BIPAP At bedtime  .  Download requested.  follow up Dr. Elsworth Soho  In  1 year and As needed

## 2015-11-05 NOTE — Assessment & Plan Note (Signed)
Sx controlled with BIPAP   Plan  Patient Instructions  Continue on BIPAP At bedtime  .  Download requested.  follow up Dr. Elsworth Soho  In  1 year and As needed

## 2015-11-05 NOTE — Addendum Note (Signed)
Addended by: Collier Salina on: 11/05/2015 04:31 PM   Modules accepted: Orders

## 2015-11-05 NOTE — Patient Instructions (Signed)
Continue on BIPAP At bedtime  .  Download requested.  follow up Dr. Elsworth Soho  In  1 year and As needed

## 2015-11-10 ENCOUNTER — Telehealth: Payer: Self-pay | Admitting: Pulmonary Disease

## 2015-11-10 DIAGNOSIS — G4733 Obstructive sleep apnea (adult) (pediatric): Secondary | ICD-10-CM

## 2015-11-10 NOTE — Telephone Encounter (Signed)
LMOMTCB x1 for pt 

## 2015-11-11 ENCOUNTER — Telehealth: Payer: Self-pay | Admitting: Family Medicine

## 2015-11-11 NOTE — Telephone Encounter (Signed)
LMTCB x2  

## 2015-11-11 NOTE — Telephone Encounter (Signed)
Spoke with pt, states that she needs a new cpap machine (per our records she is on bipap), and that she called [facility] and was told that she would need an OV with RA despite being seen by TP on 11/05/15.  Spoke with Tammy at [facility], states that they did not tell pt that she needed another OV, and that they have not received an order for new bipap.    RA please advise- ok to place new order for bipap?  Thanks!

## 2015-11-11 NOTE — Telephone Encounter (Signed)
I do not think so.  Only if she had abd pain or mid abd pain around the shoulder baldes.  She could stop it for a awhile and see if pain persists.

## 2015-11-11 NOTE — Telephone Encounter (Signed)
Order was sent to [facility] 

## 2015-11-11 NOTE — Telephone Encounter (Signed)
Pt called [facility] stating that since starting Victoza she has been having low back pain and was wondering if this is cause by the medication?

## 2015-11-11 NOTE — Telephone Encounter (Signed)
Okay to order bipap 10/5

## 2015-11-11 NOTE — Telephone Encounter (Signed)
Pt returning call.April [name] ° °

## 2015-11-12 DIAGNOSIS — H25813 Combined forms of age-related cataract, bilateral: Secondary | ICD-10-CM | POA: Diagnosis not present

## 2015-11-12 DIAGNOSIS — E119 Type 2 diabetes mellitus without complications: Secondary | ICD-10-CM | POA: Diagnosis not present

## 2015-11-12 DIAGNOSIS — Z7984 Long term (current) use of oral hypoglycemic drugs: Secondary | ICD-10-CM | POA: Diagnosis not present

## 2015-11-12 LAB — HM DIABETES EYE EXAM

## 2015-11-12 NOTE — Telephone Encounter (Signed)
LMTRC

## 2015-11-16 NOTE — Progress Notes (Signed)
Reviewed & agree with plan  

## 2015-11-27 DIAGNOSIS — J8410 Pulmonary fibrosis, unspecified: Secondary | ICD-10-CM | POA: Diagnosis not present

## 2015-11-27 DIAGNOSIS — Q319 Congenital malformation of larynx, unspecified: Secondary | ICD-10-CM | POA: Diagnosis not present

## 2015-11-27 DIAGNOSIS — G4733 Obstructive sleep apnea (adult) (pediatric): Secondary | ICD-10-CM | POA: Diagnosis not present

## 2015-11-27 DIAGNOSIS — R0902 Hypoxemia: Secondary | ICD-10-CM | POA: Diagnosis not present

## 2015-11-29 ENCOUNTER — Other Ambulatory Visit: Payer: Self-pay | Admitting: Family Medicine

## 2015-12-01 ENCOUNTER — Encounter: Payer: Self-pay | Admitting: Family Medicine

## 2015-12-08 ENCOUNTER — Telehealth: Payer: Self-pay | Admitting: Pulmonary Disease

## 2015-12-08 NOTE — Telephone Encounter (Signed)
Called and spoke with [facility] and she stated that they need the sleep study that was done in the past to make sure the pt meets the medicare guidelines.  I have faxed this to care management at [phone]. Nothing further is needed.

## 2015-12-28 DIAGNOSIS — J8410 Pulmonary fibrosis, unspecified: Secondary | ICD-10-CM | POA: Diagnosis not present

## 2015-12-28 DIAGNOSIS — R0902 Hypoxemia: Secondary | ICD-10-CM | POA: Diagnosis not present

## 2015-12-28 DIAGNOSIS — G4733 Obstructive sleep apnea (adult) (pediatric): Secondary | ICD-10-CM | POA: Diagnosis not present

## 2015-12-28 DIAGNOSIS — Q319 Congenital malformation of larynx, unspecified: Secondary | ICD-10-CM | POA: Diagnosis not present

## 2016-01-27 DIAGNOSIS — J8410 Pulmonary fibrosis, unspecified: Secondary | ICD-10-CM | POA: Diagnosis not present

## 2016-01-27 DIAGNOSIS — R0902 Hypoxemia: Secondary | ICD-10-CM | POA: Diagnosis not present

## 2016-01-27 DIAGNOSIS — G4733 Obstructive sleep apnea (adult) (pediatric): Secondary | ICD-10-CM | POA: Diagnosis not present

## 2016-01-27 DIAGNOSIS — Q319 Congenital malformation of larynx, unspecified: Secondary | ICD-10-CM | POA: Diagnosis not present

## 2016-02-22 ENCOUNTER — Ambulatory Visit (INDEPENDENT_AMBULATORY_CARE_PROVIDER_SITE_OTHER): Payer: Medicare Other | Admitting: Pulmonary Disease

## 2016-02-22 ENCOUNTER — Encounter: Payer: Self-pay | Admitting: Pulmonary Disease

## 2016-02-22 DIAGNOSIS — G4733 Obstructive sleep apnea (adult) (pediatric): Secondary | ICD-10-CM

## 2016-02-22 DIAGNOSIS — J398 Other specified diseases of upper respiratory tract: Secondary | ICD-10-CM

## 2016-02-22 NOTE — Patient Instructions (Addendum)
Your BiPAP machine is set at 10/5 and is working well You can decrease the humidity setting on the machine

## 2016-02-22 NOTE — Assessment & Plan Note (Signed)
BiPAP machine is set at 10/5 and is working well You can decrease the humidity setting on the machine  BiPAP was daily helped splint her airway ,enable to lay down flat and get some rest at night

## 2016-02-22 NOTE — Assessment & Plan Note (Signed)
-  Appears stable on BiPAP during sleep

## 2016-02-22 NOTE — Progress Notes (Signed)
   Subjective:    Patient ID: April [name], female    DOB: December 05, 1940, 76 y.o.   MRN: [id number]  HPI  76 year old former -smoker with an extensive workup for dyspnea which was finally attributed to tracheobroncho- malacia. Improved with BiPAP. She also has large Hiatal hernia.   02/22/2016   Chief Complaint  Patient presents with  . Follow-up    follow for bipap machine.     She got a new BiPAP machine a few months ago and is here for a face-to-face visit BiPAP has really helped her, gives her better rest at night and she is able to lie supine on one pillow only if she has BiPAP on. She is very compliant with this and denies any problems with mask or pressure  She reports that the new machine is using up a lot of water and she occasionally has a nasal drip She was taken off oxygen a few years ago and has done well since  Download shows excellent compliance on 10/5 with usage about 8 hours every night, no leak & excellent control of events  Significant tests/ events :  HRCT 03/2007 & 05/2008 did not show any evidence of pulmonary fibrosis . Large hiatal hernia was noted.  PFT initially showed intraparenchymal restriction with a diffuse capacity of 49%. PFTs 09/2007 >> marked improvment, FEV1% was 78, FEV68 %, FVC 63%, TLC 67%, DLCO remains 51%.  PSG  did reveal mild obstructive sleep apnea. On BiPAP +10/5 & is able to sleep supine.  Echo- no rt--> LT shunt   Feb, 2012 - Hoarseness resolved - lisinopril stopped, neg ENt evaluation [name]) Does have occasional GERD.  06/17/2010 -ONO on bipap/ RA desaturation for about 6 mins -   08/22/2012 spirometry >> FVC 75% , mild restriction - improved slightly   Review of Systems Patient denies significant dyspnea,cough, hemoptysis,  chest pain, palpitations, pedal edema, orthopnea, paroxysmal nocturnal dyspnea, lightheadedness, nausea, vomiting, abdominal or  leg pains      Objective:   Physical Exam  Gen. Pleasant, obese, in no  distress ENT - no lesions, no post nasal drip Neck: No JVD, no thyromegaly, no carotid bruits Lungs: no use of accessory muscles, no dullness to percussion, decreased without rales or rhonchi  Cardiovascular: Rhythm regular, heart sounds  normal, no murmurs or gallops, no peripheral edema Musculoskeletal: No deformities, no cyanosis or clubbing , no tremors       Assessment & Plan:

## 2016-02-27 DIAGNOSIS — Q319 Congenital malformation of larynx, unspecified: Secondary | ICD-10-CM | POA: Diagnosis not present

## 2016-02-27 DIAGNOSIS — G4733 Obstructive sleep apnea (adult) (pediatric): Secondary | ICD-10-CM | POA: Diagnosis not present

## 2016-02-27 DIAGNOSIS — R0902 Hypoxemia: Secondary | ICD-10-CM | POA: Diagnosis not present

## 2016-02-27 DIAGNOSIS — J8410 Pulmonary fibrosis, unspecified: Secondary | ICD-10-CM | POA: Diagnosis not present

## 2016-02-28 ENCOUNTER — Other Ambulatory Visit: Payer: Self-pay

## 2016-02-28 ENCOUNTER — Other Ambulatory Visit: Payer: Self-pay | Admitting: Cardiology

## 2016-02-28 MED ORDER — NITROGLYCERIN 0.4 MG SL SUBL: 0.4000 mg | SUBLINGUAL_TABLET | SUBLINGUAL | 4 refills | Status: DC | PRN

## 2016-02-28 NOTE — Telephone Encounter (Signed)
imdur refilled for 1 year supply Sept 2017

## 2016-02-28 NOTE — Telephone Encounter (Signed)
Rx(s) sent to pharmacy electronically.  

## 2016-02-29 ENCOUNTER — Other Ambulatory Visit: Payer: Self-pay

## 2016-02-29 MED ORDER — NITROGLYCERIN 0.4 MG SL SUBL: 0.4000 mg | SUBLINGUAL_TABLET | SUBLINGUAL | 4 refills | Status: DC | PRN

## 2016-03-15 ENCOUNTER — Encounter: Payer: Self-pay | Admitting: Pulmonary Disease

## 2016-03-28 ENCOUNTER — Other Ambulatory Visit: Payer: Self-pay | Admitting: Family Medicine

## 2016-05-18 DIAGNOSIS — E119 Type 2 diabetes mellitus without complications: Secondary | ICD-10-CM | POA: Diagnosis not present

## 2016-05-24 ENCOUNTER — Other Ambulatory Visit: Payer: Self-pay | Admitting: Family Medicine

## 2016-06-08 ENCOUNTER — Other Ambulatory Visit: Payer: Self-pay | Admitting: Family Medicine

## 2016-06-08 DIAGNOSIS — E1165 Type 2 diabetes mellitus with hyperglycemia: Principal | ICD-10-CM

## 2016-06-08 DIAGNOSIS — IMO0001 Reserved for inherently not codable concepts without codable children: Secondary | ICD-10-CM

## 2016-06-20 ENCOUNTER — Other Ambulatory Visit: Payer: Self-pay | Admitting: Family Medicine

## 2016-06-20 DIAGNOSIS — E119 Type 2 diabetes mellitus without complications: Secondary | ICD-10-CM

## 2016-06-23 ENCOUNTER — Other Ambulatory Visit: Payer: Self-pay | Admitting: Cardiology

## 2016-06-23 NOTE — Telephone Encounter (Signed)
REFILL 

## 2016-08-18 ENCOUNTER — Other Ambulatory Visit: Payer: Self-pay | Admitting: Family Medicine

## 2016-08-30 DIAGNOSIS — G4733 Obstructive sleep apnea (adult) (pediatric): Secondary | ICD-10-CM | POA: Diagnosis not present

## 2016-09-18 DIAGNOSIS — S82821A Torus fracture of lower end of right fibula, initial encounter for closed fracture: Secondary | ICD-10-CM | POA: Diagnosis not present

## 2016-09-19 ENCOUNTER — Other Ambulatory Visit: Payer: Self-pay | Admitting: Cardiology

## 2016-09-19 NOTE — Telephone Encounter (Signed)
REFILL 

## 2016-09-29 ENCOUNTER — Ambulatory Visit (INDEPENDENT_AMBULATORY_CARE_PROVIDER_SITE_OTHER): Payer: Medicare Other | Admitting: Family Medicine

## 2016-09-29 ENCOUNTER — Encounter: Payer: Self-pay | Admitting: Family Medicine

## 2016-09-29 VITALS — BP 136/84 | HR 78 | Temp 97.9°F | Resp 14 | Ht 62.0 in | Wt 174.0 lb

## 2016-09-29 DIAGNOSIS — E038 Other specified hypothyroidism: Secondary | ICD-10-CM

## 2016-09-29 DIAGNOSIS — Z0000 Encounter for general adult medical examination without abnormal findings: Secondary | ICD-10-CM | POA: Diagnosis not present

## 2016-09-29 DIAGNOSIS — I10 Essential (primary) hypertension: Secondary | ICD-10-CM

## 2016-09-29 DIAGNOSIS — E7800 Pure hypercholesterolemia, unspecified: Secondary | ICD-10-CM

## 2016-09-29 DIAGNOSIS — Z1231 Encounter for screening mammogram for malignant neoplasm of breast: Secondary | ICD-10-CM | POA: Diagnosis not present

## 2016-09-29 DIAGNOSIS — E039 Hypothyroidism, unspecified: Secondary | ICD-10-CM | POA: Diagnosis not present

## 2016-09-29 DIAGNOSIS — E118 Type 2 diabetes mellitus with unspecified complications: Secondary | ICD-10-CM

## 2016-09-29 DIAGNOSIS — R197 Diarrhea, unspecified: Secondary | ICD-10-CM | POA: Diagnosis not present

## 2016-09-29 LAB — CBC WITH DIFFERENTIAL/PLATELET
BASOS PCT: 0 %
Basophils Absolute: 0 cells/uL (ref 0–200)
EOS PCT: 2 %
Eosinophils Absolute: 150 cells/uL (ref 15–500)
HEMATOCRIT: 45.4 % — AB (ref 35.0–45.0)
HEMOGLOBIN: 14.7 g/dL (ref 12.0–15.0)
Lymphocytes Relative: 43 %
Lymphs Abs: 3225 cells/uL (ref 850–3900)
MCH: 29.5 pg (ref 27.0–33.0)
MCHC: 32.4 g/dL (ref 32.0–36.0)
MCV: 91 fL (ref 80.0–100.0)
MONO ABS: 600 {cells}/uL (ref 200–950)
MPV: 10.5 fL (ref 7.5–12.5)
Monocytes Relative: 8 %
NEUTROS ABS: 3525 {cells}/uL (ref 1500–7800)
Neutrophils Relative %: 47 %
PLATELETS: 268 10*3/uL (ref 140–400)
RBC: 4.99 MIL/uL (ref 3.80–5.10)
RDW: 14 % (ref 11.0–15.0)
WBC: 7.5 10*3/uL (ref 3.8–10.8)

## 2016-09-29 LAB — TSH: TSH: 3.93 mIU/L

## 2016-09-29 NOTE — Progress Notes (Signed)
Subjective:    Patient ID: April [name], female    DOB: 1940-04-18, 76 y.o.   MRN: [id number]  Medication Refill     06/11/15 Patient has diabetes mellitus type 2. Her fasting blood sugars are averaging between 140 and 160. She is currently on glipizide, jardiance, and tradjenta.  She has not seen improvement on the to name brand medications. We discontinued metformin in the past due to diarrhea. She denies any hypoglycemic episodes. She denies any chest pain shortness of breath or dyspnea on exertion. She denies any blurry vision At that time, my plan was: Discontinue Tradjenta and jardiance due to cost.  Replace with metformin 500 mg by mouth twice a day and Actos 30 mg by mouth daily. Recheck fasting blood sugars in 6 weeks. May need to try at least one name brand medication his fasting blood sugars are not less than 130 in 6 weeks 11/02/15 Patient is here today for follow-up. She's gained substantial weight since discontinuing Jardiance in switching to Actos as one would expect. This is worsened her shortness of breath. She denies any polyuria or polydipsia, she denies blurred vision. She denies any chest pain. She does have chronic shortness of breath and chronic dyspnea on exertion. She is due receive her flu shot. She denies any myalgias or right upper quadrant pain. She is overdue for fasting lab work.  AT that time, my plan was:  The patient's blood pressure today is well controlled. However I do believe that the medication changes we were forced to make in April due to cost have caused her weight gain. Therefore I commenced the patient to discontinue Actos even though is cheaper and switch to victoza 1.2 mg daily patient received her flu shot today. I will also check a fasting lipid panel. Goal LDL cholesterol is less than 100. I'll also check a urine microalbumin. Diabetic eye exam is up-to-date.  09/29/16 Patient has not been seen in quite some time. She is here today for complete  physical exam. Her last colonoscopy was 2 or 3 years ago and was normal. She does not require another. She is due for a mammogram. Because of age, she does not require hysterectomy. Immunizations are up-to-date with the exception of tetanus vaccine which her insurance will not cover: Immunization History  Administered Date(s) Administered  . Influenza Nasal 11/14/2011  . Influenza Split 11/02/2015  . Influenza Whole 11/26/2006, 11/27/2008, 11/16/2009  . Influenza,inj,Quad PF,36+ Mos 12/11/2012, 12/22/2013, 11/03/2014, 11/23/2014  . Pneumococcal Conjugate-13 01/21/2013  . Pneumococcal Polysaccharide-23 03/19/2006, 12/22/2013   Patient states that her blood sugars are between 120 and 140. She has lost 5 pounds since starting Victoza. Unfortunately since starting this medication, the patient states she has severe daily diarrhea. I believe the metformin is likely causing this, but the patient states the diarrhea worsened only when she started vicotza.  She denies any hypoglycemia. She denies any polyuria polydipsia or blurry vision. She does report some occasional vertigo. Diabetic foot exam was performed today and is normal other than the fact she recently broke her right ankle after a fall. She denies any depression Past Medical History:  Diagnosis Date  . Abnormal weight gain   . Arthritis    "joints" (06/13/2013)  . COPD (chronic obstructive pulmonary disease) ([place])   . Coronary artery disease, non-occlusive 06/2013   Cardiac Cath: sharp Angle take-off of Large Dominant Cx: ~70-80% mid Cx bifurcation lesion (Lateral OM &  AVGCx-PL-PDA both with hairpin ostial & ~50% lesions) -  Not PCI amenable due to vessel tortuosity; ~40-50% mid LAD;Ramus - no significnat diseaes; small non-dominant RCA  . Diverticulosis   . GERD (gastroesophageal reflux disease)   . History of stress test    a. 09/2006 nl Dobutamine Echo  . Hyperlipidemia   . Hypertension   . NIDDM (non-insulin dependent diabetes mellitus)     . On home oxygen therapy    "2L q hs; runs into my BIPAP" (06/13/2013)  . OSA (obstructive sleep apnea)    "BIPAP w/O2" (06/13/2013)  . Tracheobronchomalacia    a. followed by [facility].    Past Surgical History:  Procedure Laterality Date  . CARDIAC CATHETERIZATION  06/2013   Cardiac Cath: sharp Angle take-off of Large Dominant Cx: ~70-80% mid Cx bifurcation lesion (Lateral OM &  AVGCx-PL-PDA both with hairpin ostial & ~50% lesions) - Not PCI amenable due to vessel tortuosity; ~40-50% mid LAD;Ramus - no significnat diseaes; small non-dominant RCA  . LEFT HEART CATHETERIZATION WITH CORONARY ANGIOGRAM N/A 06/16/2013   Procedure: LEFT HEART CATHETERIZATION WITH CORONARY ANGIOGRAM;  Surgeon: [name], MD;  Location: [facility] CATH LAB;  Service: Cardiovascular;  Laterality: N/A;  . TRANSTHORACIC ECHOCARDIOGRAM  06/17/2013   Normal LV size and function. EF 55-60% with no regional WMA. Grade 1 diastolic dysfunction. No significant valvular lesions  . TUBAL LIGATION     Current Outpatient Prescriptions on File Prior to Visit  Medication Sig Dispense Refill  . aspirin 81 MG tablet Take 81 mg by mouth daily.      . Calcium Carbonate-Vitamin D (CALCIUM 600 + D PO) Take 1 tablet by mouth daily.      Marland Kitchen gabapentin (NEURONTIN) 600 MG tablet TAKE 1 TABLET BY MOUTH 4 TIMES A DAY 360 tablet 3  . glipiZIDE (GLUCOTROL XL) 5 MG 24 hr tablet TAKE 1 TABLET BY MOUTH ONCE DAILY 30 tablet 3  . hydrochlorothiazide (MICROZIDE) 12.5 MG capsule TAKE 1 CAPSULE BY MOUTH ONCE DAILY 30 capsule 3  . isosorbide mononitrate (IMDUR) 30 MG 24 hr tablet Take 1 tablet (30 mg total) by mouth daily. NEED OV. 15 tablet 0  . metFORMIN (GLUCOPHAGE) 500 MG tablet TAKE 1 TABLET BY MOUTH TWICE A DAY WITH A MEAL. 180 tablet 1  . metoprolol tartrate (LOPRESSOR) 25 MG tablet TAKE 1 TABLET BY MOUTH TWICE A DAY 60 tablet 5  . Multiple Vitamin (MULTIVITAMIN) capsule Take 1 capsule by mouth daily.      . nitroGLYCERIN (NITROSTAT) 0.4 MG SL  tablet Place 1 tablet (0.4 mg total) under the tongue every 5 (five) minutes x 3 doses as needed for chest pain. 25 tablet 4  . Omega-3 Fatty Acids (FISH OIL) 1000 MG CAPS Take 1 capsule by mouth daily.      Marland Kitchen omeprazole (PRILOSEC) 20 MG capsule Take 20 mg by mouth daily.    . simvastatin (ZOCOR) 20 MG tablet TAKE 1 TABLET BY MOUTH EVERY NIGHT AT BEDTIME 90 tablet 3  . VICTOZA 18 MG/3ML SOPN INJECT 0.2 ML (1.2 MG TOTAL) INTO THE SKIN DAILY 6 mL 3   No current facility-administered medications on file prior to visit.    Allergies  Allergen Reactions  . Neomycin Other (See Comments)    Visual disturbance and swelling in eyes   Social History   Social History  . Marital status: Married    Spouse name: N/A  . Number of children: 3  . Years of education: N/A   Occupational History  . Systems analyst    Social  History Main Topics  . Smoking status: Former Smoker    Packs/day: 1.50    Years: 20.00    Types: Cigarettes    Quit date: 02/13/1986  . Smokeless tobacco: Never Used  . Alcohol use No  . Drug use: No  . Sexual activity: Yes   Other Topics Concern  . Not on file   Social History Narrative   Lives in [place] with her husband. 3 children.  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   Former 30 Pk/yr Smoker (1.5 PPD x 20 yr) - quit in 1998.     Family History  Problem Relation Age of Onset  . Asthma Maternal Grandmother   . Heart disease Maternal Grandmother   . Heart disease Mother        developed coronary dzs in her 13's.  . Clotting disorder Mother   . Heart disease Maternal Grandfather   . Diabetes Brother   . Diabetes Paternal Aunt   . CAD Brother        s/p cabg in his 86's  . CAD Brother        s/p cabg in his 65's.    Review of Systems  All other systems reviewed and are negative.      Objective:   Physical Exam  Constitutional: She is oriented to person, place, and time. She appears well-developed and well-nourished. No distress.  HENT:  Head:  Normocephalic and atraumatic.  Right Ear: External ear normal.  Left Ear: External ear normal.  Nose: Nose normal.  Mouth/Throat: Oropharynx is clear and moist. No oropharyngeal exudate.  Eyes: Pupils are equal, round, and reactive to light. Conjunctivae and EOM are normal. Right eye exhibits no discharge. Left eye exhibits no discharge. No scleral icterus.  Neck: Normal range of motion. Neck supple. No JVD present. No tracheal deviation present. No thyromegaly present.  Cardiovascular: Normal rate, regular rhythm, normal heart sounds and intact distal pulses.  Exam reveals no gallop and no friction rub.   No murmur heard. Pulmonary/Chest: Effort normal and breath sounds normal. No stridor. No respiratory distress. She has no wheezes. She has no rales. She exhibits no tenderness.  Abdominal: Soft. Bowel sounds are normal. She exhibits no distension and no mass. There is no tenderness. There is no rebound and no guarding.  Musculoskeletal: She exhibits no edema, tenderness or deformity.  Lymphadenopathy:    She has no cervical adenopathy.  Neurological: She is alert and oriented to person, place, and time. She has normal reflexes. She displays normal reflexes. No cranial nerve deficit. She exhibits normal muscle tone. Coordination normal.  Skin: Skin is warm. No rash noted. She is not diaphoretic. No erythema. No pallor.  Psychiatric: She has a normal mood and affect. Her behavior is normal. Thought content normal.  Vitals reviewed.         Assessment & Plan:  Controlled type 2 diabetes mellitus with complication, without long-term current use of insulin ([place]) - Plan: CBC with Differential/Platelet, COMPLETE METABOLIC PANEL WITH GFR, Lipid panel, Microalbumin, urine, Hemoglobin A1c  Diarrhea, unspecified type  Subclinical hypothyroidism - Plan: TSH  Essential hypertension  Pure hypercholesterolemia  General medical exam  Physical exam is completely normal. Blood pressures  acceptable. Reported blood sugars are acceptable. Check CBC, CMP, fasting lipid panel, urine microalbumin, and a hemoglobin A1c. I will be willing to except a hemoglobin A1c less than 7. I will also monitor a TSH given her history of subclinical hypothyroidism. Immunizations are up-to-date. I will schedule the patient for mammogram.  Colonoscopy is up-to-date and Pap smears not required. Diabetic foot exam, depression screen, and fall risk assessment performed. The patient will discontinue victoza.  Recheck in 1-2 weeks. If diarrhea doesn't fact improve, I will replace that medicine with Januvia. If diarrhea does not improve, I will have her resume victoza and discontinue metformin and replace metformin with Januvia.

## 2016-09-30 LAB — COMPLETE METABOLIC PANEL WITH GFR
ALT: 30 U/L — ABNORMAL HIGH (ref 6–29)
AST: 23 U/L (ref 10–35)
Albumin: 4.1 g/dL (ref 3.6–5.1)
Alkaline Phosphatase: 54 U/L (ref 33–130)
BUN: 17 mg/dL (ref 7–25)
CHLORIDE: 100 mmol/L (ref 98–110)
CO2: 29 mmol/L (ref 20–32)
CREATININE: 0.61 mg/dL (ref 0.60–0.93)
Calcium: 9.8 mg/dL (ref 8.6–10.4)
GFR, Est Non African American: >89 mL/min (ref >=60)
GLUCOSE: 136 mg/dL — AB (ref 70–99)
Potassium: 4.4 mmol/L (ref 3.5–5.3)
SODIUM: 142 mmol/L (ref 135–146)
Total Bilirubin: 0.4 mg/dL (ref 0.2–1.2)
Total Protein: 6.6 g/dL (ref 6.1–8.1)

## 2016-09-30 LAB — LIPID PANEL
Cholesterol: 180 mg/dL (ref <200)
HDL: 50 mg/dL — AB (ref >50)
LDL Cholesterol: 67 mg/dL (ref <100)
TRIGLYCERIDES: 317 mg/dL — AB (ref <150)
Total CHOL/HDL Ratio: 3.6 Ratio (ref <5.0)
VLDL: 63 mg/dL — AB (ref <30)

## 2016-09-30 LAB — HEMOGLOBIN A1C
Hgb A1c MFr Bld: 6.4 % — ABNORMAL HIGH (ref <5.7)
Mean Plasma Glucose: 137 mg/dL

## 2016-09-30 LAB — MICROALBUMIN, URINE: MICROALB UR: 1.2 mg/dL

## 2016-10-02 DIAGNOSIS — S82821D Torus fracture of lower end of right fibula, subsequent encounter for fracture with routine healing: Secondary | ICD-10-CM | POA: Diagnosis not present

## 2016-10-17 DIAGNOSIS — S82821D Torus fracture of lower end of right fibula, subsequent encounter for fracture with routine healing: Secondary | ICD-10-CM | POA: Diagnosis not present

## 2016-10-18 ENCOUNTER — Other Ambulatory Visit: Payer: Self-pay | Admitting: Cardiology

## 2016-10-18 ENCOUNTER — Other Ambulatory Visit: Payer: Self-pay | Admitting: Family Medicine

## 2016-10-18 NOTE — Telephone Encounter (Signed)
REFILL 

## 2016-10-27 ENCOUNTER — Ambulatory Visit: Payer: Medicare Other

## 2016-10-30 ENCOUNTER — Ambulatory Visit: Payer: Medicare Other

## 2016-11-03 ENCOUNTER — Ambulatory Visit
Admission: RE | Admit: 2016-11-03 | Discharge: 2016-11-03 | Disposition: A | Discharge status: Home / Self Care | Payer: Medicare Other | Source: Ambulatory Visit | Attending: Family Medicine | Admitting: Family Medicine

## 2016-11-03 DIAGNOSIS — Z1231 Encounter for screening mammogram for malignant neoplasm of breast: Secondary | ICD-10-CM

## 2016-11-07 ENCOUNTER — Encounter: Payer: Self-pay | Admitting: Cardiology

## 2016-11-07 ENCOUNTER — Ambulatory Visit (INDEPENDENT_AMBULATORY_CARE_PROVIDER_SITE_OTHER): Payer: Medicare Other | Admitting: Cardiology

## 2016-11-07 VITALS — BP 130/82 | HR 80 | Ht 61.0 in | Wt 174.4 lb

## 2016-11-07 DIAGNOSIS — I200 Unstable angina: Secondary | ICD-10-CM

## 2016-11-07 DIAGNOSIS — I10 Essential (primary) hypertension: Secondary | ICD-10-CM | POA: Diagnosis not present

## 2016-11-07 DIAGNOSIS — E669 Obesity, unspecified: Secondary | ICD-10-CM | POA: Diagnosis not present

## 2016-11-07 DIAGNOSIS — I25118 Atherosclerotic heart disease of native coronary artery with other forms of angina pectoris: Secondary | ICD-10-CM

## 2016-11-07 DIAGNOSIS — E785 Hyperlipidemia, unspecified: Secondary | ICD-10-CM

## 2016-11-07 NOTE — Patient Instructions (Signed)
NO CHANGE  WITH CURRENT MEDICATIONS   STAY ACTIVE, WATCH YOUR INTAKE OF STARCHS Your physician discussed the importance of regular exercise and recommended that you start or continue a regular exercise program for good health.    Your physician wants you to follow-up in [place].You will receive a reminder letter in the mail two months in advance. If you don't receive a letter, please call our office to schedule the follow-up appointment.

## 2016-11-07 NOTE — Progress Notes (Signed)
PCP: Susy Frizzle, MD  Clinic Note: Chief Complaint  Patient presents with  . Follow-up    CAD - of LCx - too tortuous for PCI attempt - Med Rx    HPI: April [name] is a 76 y.o. female with a PMH below who presents today for annual f/u for CAD (unamenable for PCI) .  May 2015 - progressive/crescendo Angina: Cath: Severe single site CAD involving very unfavorable bifurcation of the very tortuous L Circumflex) --> Recommendation was Med Rx.  She also has chronic dyspnea from COPD  OSA on BiPAP.  April [name] was last seen on 10/25/2015 - COPD related DOE, no angina. Concerned re: wgt gain with DM meds.  Recent Hospitalizations: n/a  Studies Personally Reviewed - (if available, images/films reviewed: From Epic Chart or Care Everywhere)  n/a  Interval History: April [name] presents today relatively without any major complaints from a CV standpoint.  She has her standard exertional dyspnea but notes no change to this. No PND or orthopnea. No edema. She does have occasional/rare jaw pain that happens often on and is not associated with any particular activity or exertion. She is still relatively active walking at [facility] in the dining facility. She denies any resting or exertional chest discomfort or pressure.  No palpitations, lightheadedness, dizziness, weakness or syncope/near syncope. No TIA/amaurosis fugax symptoms.. No claudication.  ROS: A comprehensive was performed. Review of Systems  Constitutional: Negative for malaise/fatigue and weight loss.  HENT: Negative for congestion and nosebleeds.   Respiratory: Positive for cough, shortness of breath (Baseline from COPD) and wheezing. Negative for sputum production.        COPD  Cardiovascular: Positive for orthopnea (Without BiPAP).  Gastrointestinal: Negative for abdominal pain, blood in stool and heartburn.  Genitourinary: Negative for hematuria.  Musculoskeletal: Positive for falls (She had a fall back  in the early part of the summer and broke her right ankle. - > This was when she was [place] the beach, she fell getting out of the shower, slipped) and joint pain (Osteoarthritis).  Endo/Heme/Allergies: Does not bruise/bleed easily.  Psychiatric/Behavioral: Negative for depression and memory loss. The patient is not nervous/anxious.   All other systems reviewed and are negative.  I have reviewed and (if needed) personally updated the patient's problem list, medications, allergies, past medical and surgical history, social and family history.   Past Medical History:  Diagnosis Date  . Abnormal weight gain   . Arthritis    "joints" (06/13/2013)  . COPD (chronic obstructive pulmonary disease) ([place])   . Coronary artery disease, non-occlusive 06/2013   Cardiac Cath: sharp Angle take-off of Large Dominant Cx: ~70-80% mid Cx bifurcation lesion (Lateral OM &  AVGCx-PL-PDA both with hairpin ostial & ~50% lesions) - Not PCI amenable due to vessel tortuosity; ~40-50% mid LAD;Ramus - no significnat diseaes; small non-dominant RCA  . Diverticulosis   . GERD (gastroesophageal reflux disease)   . History of stress test    a. 09/2006 nl Dobutamine Echo  . Hyperlipidemia   . Hypertension   . NIDDM (non-insulin dependent diabetes mellitus)   . On home oxygen therapy    "2L q hs; runs into my BIPAP" (06/13/2013)  . OSA (obstructive sleep apnea)    "BIPAP w/O2" (06/13/2013)  . Tracheobronchomalacia    a. followed by [facility].    Past Surgical History:  Procedure Laterality Date  . CARDIAC CATHETERIZATION  06/2013   Cardiac Cath: sharp Angle take-off of Large Dominant Cx: ~70-80% mid  Cx bifurcation lesion (Lateral OM &  AVGCx-PL-PDA both with hairpin ostial & ~50% lesions) - Not PCI amenable due to vessel tortuosity; ~40-50% mid LAD;Ramus - no significnat diseaes; small non-dominant RCA  . LEFT HEART CATHETERIZATION WITH CORONARY ANGIOGRAM N/A 06/16/2013   Procedure: LEFT HEART CATHETERIZATION WITH CORONARY  ANGIOGRAM;  Surgeon: [name], MD;  Location: [facility] CATH LAB;  Service: Cardiovascular;  Laterality: N/A;  . TRANSTHORACIC ECHOCARDIOGRAM  06/17/2013   Normal LV size and function. EF 55-60% with no regional WMA. Grade 1 diastolic dysfunction. No significant valvular lesions  . TUBAL LIGATION      Current Meds  Medication Sig  . aspirin 81 MG tablet Take 81 mg by mouth daily.    . Calcium Carbonate-Vitamin D (CALCIUM 600 + D PO) Take 1 tablet by mouth daily.    Marland Kitchen gabapentin (NEURONTIN) 600 MG tablet TAKE 1 TABLET BY MOUTH 4 TIMES A DAY  . glipiZIDE (GLUCOTROL XL) 5 MG 24 hr tablet TAKE 1 TABLET BY MOUTH ONCE DAILY  . hydrochlorothiazide (MICROZIDE) 12.5 MG capsule TAKE 1 CAPSULE BY MOUTH ONCE DAILY  . isosorbide mononitrate (IMDUR) 30 MG 24 hr tablet Take 1 tablet (30 mg total) by mouth daily. KEEP OV.  . metFORMIN (GLUCOPHAGE) 500 MG tablet TAKE 1 TABLET BY MOUTH TWICE A DAY WITH A MEAL.  . metoprolol tartrate (LOPRESSOR) 25 MG tablet TAKE 1 TABLET BY MOUTH TWICE A DAY  . Multiple Vitamin (MULTIVITAMIN) capsule Take 1 capsule by mouth daily.    . nitroGLYCERIN (NITROSTAT) 0.4 MG SL tablet Place 1 tablet (0.4 mg total) under the tongue every 5 (five) minutes x 3 doses as needed for chest pain.  . Omega-3 Fatty Acids (FISH OIL) 1000 MG CAPS Take 1 capsule by mouth daily.    Marland Kitchen omeprazole (PRILOSEC) 20 MG capsule Take 20 mg by mouth daily.  . simvastatin (ZOCOR) 20 MG tablet TAKE 1 TABLET BY MOUTH EVERY NIGHT AT BEDTIME  . VICTOZA 18 MG/3ML SOPN INJECT 0.2 ML (1.2 MG TOTAL) INTO THE SKIN DAILY    Allergies  Allergen Reactions  . Neomycin Other (See Comments)    Visual disturbance and swelling in eyes    Social History   Social History  . Marital status: Married    Spouse name: N/A  . Number of children: 3  . Years of education: N/A   Occupational History  . Systems analyst    Social History Main Topics  . Smoking status: Former Smoker    Packs/day: 1.50    Years: 20.00      Types: Cigarettes    Quit date: 02/13/1986  . Smokeless tobacco: Never Used  . Alcohol use No  . Drug use: No  . Sexual activity: Yes   Other Topics Concern  . None   Social History Narrative   Lives in [place] with her husband. 3 children.  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   Former 30 Pk/yr Smoker (1.5 PPD x 20 yr) - quit in 1998.    family history includes Asthma in her maternal grandmother; CAD in her brother and brother; Clotting disorder in her mother; Diabetes in her brother and paternal aunt; Heart disease in her maternal grandfather, maternal grandmother, and mother.  Wt Readings from Last 3 Encounters:  11/07/16 174 lb 6.4 oz (79.1 kg)  09/29/16 174 lb (78.9 kg)  02/22/16 176 lb (79.8 kg)    PHYSICAL EXAM BP 130/82   Pulse 80   Ht 5\' 1"  (1.549 m)   Wt  174 lb 6.4 oz (79.1 kg)   LMP  (LMP Unknown)   BMI 32.95 kg/m  Physical Exam  Constitutional: She is oriented to person, place, and time. She appears well-developed and well-nourished. No distress.  Mildly obese but otherwise healthy-appearing  HENT:  Head: Normocephalic and atraumatic.  Mouth/Throat: No oropharyngeal exudate.  Eyes: EOM are normal.  Neck: Normal range of motion. Neck supple. No hepatojugular reflux and no JVD present. Carotid bruit is not present.  Cardiovascular: Normal rate, regular rhythm, normal heart sounds and intact distal pulses.   No extrasystoles are present. PMI is not displaced.  Exam reveals no gallop and no friction rub.   No murmur heard. Pulmonary/Chest: Effort normal. No respiratory distress. She has no wheezes. She has no rales.  Mild diffuse interstitial sounds with faint crackles. Prolonged Expiratory phase.  Abdominal: Soft. Bowel sounds are normal. She exhibits no distension. There is no tenderness. There is no rebound.  Musculoskeletal: Normal range of motion. She exhibits no edema.  Neurological: She is alert and oriented to person, place, and time.  Skin: Skin is warm and  dry. No rash noted. No erythema.  Psychiatric: She has a normal mood and affect. Her behavior is normal. Judgment and thought content normal.  Nursing note and vitals reviewed.   Adult ECG Report  Rate: 80 ;  Rhythm: normal sinus rhythm and Normal axis, intervals and durations. Septal MI, age undetermined.;   Narrative Interpretation: Stable EKG   Other studies Reviewed: Additional studies/ records that were reviewed today include:  Recent Labs:    Lab Results  Component Value Date   CHOL 180 09/29/2016   HDL 50 (L) 09/29/2016   LDLCALC 67 09/29/2016   TRIG 317 (H) 09/29/2016   CHOLHDL 3.6 09/29/2016    ASSESSMENT / PLAN: Problem List Items Addressed This Visit    Atherosclerotic heart disease of native coronary artery with angina pectoris ([place]) - Primary (Chronic)    She really has not had any further unstable angina type symptoms. She has some symptoms which may be consistent with angina, but the jaw pain does not sound consistent with angina. No dyspnea but that can easily explained by her COPD. Her circumflex lesion was not amenable to PCI and she is otherwise doing well on current dose of Imdur and metoprolol.  She is on aspirin, statin and fish oil.  Continue to recommend staying active with exercise/walking      Essential hypertension (Chronic)    Well-controlled on current medications. No change.      Hyperlipidemia with target LDL less than 70 (Chronic)    For the most part well-controlled with exception of triglycerides. We talked about dietary modification to help with this. This correlates with glycemic control. She is concerned of the cost of the toes however I think this is probably a good option for her with CAD.      Obesity (BMI 30-39.9) (Chronic)    The patient understands the need to lose weight with diet and exercise. We have discussed specific strategies for this.      Relevant Orders   EKG 12-Lead   Unstable angina - history of (Chronic)    No  recurrent symptoms to suggest unstable angina on beta blocker and Imdur.  If symptoms were to recur, we need to investigate this he does any additional CAD. If not MR never control with medications, would potentially need to consider single vessel CABG         Current medicines are reviewed  at length with the patient today. (+/- concerns) n/a The following changes have been made: n/a  Patient Instructions  NO CHANGE  WITH CURRENT MEDICATIONS   STAY ACTIVE, WATCH YOUR INTAKE OF STARCHS Your physician discussed the importance of regular exercise and recommended that you start or continue a regular exercise program for good health.    Your physician wants you to follow-up in [place].You will receive a reminder letter in the mail two months in advance. If you don't receive a letter, please call our office to schedule the follow-up appointment.     Studies Ordered:   Orders Placed This Encounter  Procedures  . EKG 12-Lead      Glenetta Hew, M.D., M.S. Interventional Cardiologist   Pager # [phone] Phone # [phone] [address] [place], [place] [address]

## 2016-11-08 ENCOUNTER — Encounter: Payer: Self-pay | Admitting: Cardiology

## 2016-11-08 NOTE — Assessment & Plan Note (Signed)
She really has not had any further unstable angina type symptoms. She has some symptoms which may be consistent with angina, but the jaw pain does not sound consistent with angina. No dyspnea but that can easily explained by her COPD. Her circumflex lesion was not amenable to PCI and she is otherwise doing well on current dose of Imdur and metoprolol.  She is on aspirin, statin and fish oil.  Continue to recommend staying active with exercise/walking

## 2016-11-08 NOTE — Assessment & Plan Note (Signed)
No recurrent symptoms to suggest unstable angina on beta blocker and Imdur.  If symptoms were to recur, we need to investigate this he does any additional CAD. If not MR never control with medications, would potentially need to consider single vessel CABG

## 2016-11-08 NOTE — Assessment & Plan Note (Signed)
For the most part well-controlled with exception of triglycerides. We talked about dietary modification to help with this. This correlates with glycemic control. She is concerned of the cost of the toes however I think this is probably a good option for her with CAD.

## 2016-11-08 NOTE — Assessment & Plan Note (Signed)
Well controlled on current medications. No change 

## 2016-11-08 NOTE — Assessment & Plan Note (Signed)
The patient understands the need to lose weight with diet and exercise. We have discussed specific strategies for this.  

## 2016-11-17 ENCOUNTER — Other Ambulatory Visit: Payer: Self-pay | Admitting: Cardiology

## 2016-11-17 NOTE — Telephone Encounter (Signed)
Rx(s) sent to pharmacy electronically.  

## 2016-12-04 ENCOUNTER — Other Ambulatory Visit: Payer: Self-pay | Admitting: Family Medicine

## 2016-12-04 DIAGNOSIS — Z23 Encounter for immunization: Secondary | ICD-10-CM | POA: Diagnosis not present

## 2016-12-04 DIAGNOSIS — IMO0001 Reserved for inherently not codable concepts without codable children: Secondary | ICD-10-CM

## 2016-12-04 DIAGNOSIS — E1165 Type 2 diabetes mellitus with hyperglycemia: Principal | ICD-10-CM

## 2016-12-15 ENCOUNTER — Encounter: Payer: Self-pay | Admitting: Family Medicine

## 2016-12-15 ENCOUNTER — Ambulatory Visit (INDEPENDENT_AMBULATORY_CARE_PROVIDER_SITE_OTHER): Payer: Medicare Other | Admitting: Family Medicine

## 2016-12-15 VITALS — BP 140/82 | HR 86 | Temp 98.0°F | Resp 14 | Ht 62.0 in | Wt 173.0 lb

## 2016-12-15 DIAGNOSIS — E118 Type 2 diabetes mellitus with unspecified complications: Secondary | ICD-10-CM | POA: Diagnosis not present

## 2016-12-15 NOTE — Progress Notes (Signed)
Subjective:    Patient ID: April [name], female    DOB: July 21, 1940, 76 y.o.   MRN: [id number]  Medication Refill     Patient states that she wants to stop her medications.  She states that she feels fine before she takes her medicines in the morning but then shortly after taking her medication, she feels lethargic, weak, tired and does not want to do anything.  I have reviewed her medication list with her extensively.  She does report some nausea diarrhea and upset stomach which could be due to metformin and Victoza.  However the majority of her symptoms are due to fatigue and lethargy which I attribute more to her blood pressure and cardiac medications. Past Medical History:  Diagnosis Date  . Abnormal weight gain   . Arthritis    "joints" (06/13/2013)  . COPD (chronic obstructive pulmonary disease) ([place])   . Coronary artery disease, non-occlusive 06/2013   Cardiac Cath: sharp Angle take-off of Large Dominant Cx: ~70-80% mid Cx bifurcation lesion (Lateral OM &  AVGCx-PL-PDA both with hairpin ostial & ~50% lesions) - Not PCI amenable due to vessel tortuosity; ~40-50% mid LAD;Ramus - no significnat diseaes; small non-dominant RCA  . Diverticulosis   . GERD (gastroesophageal reflux disease)   . History of stress test    a. 09/2006 nl Dobutamine Echo  . Hyperlipidemia   . Hypertension   . NIDDM (non-insulin dependent diabetes mellitus)   . On home oxygen therapy    "2L q hs; runs into my BIPAP" (06/13/2013)  . OSA (obstructive sleep apnea)    "BIPAP w/O2" (06/13/2013)  . Tracheobronchomalacia    a. followed by [facility].    Past Surgical History:  Procedure Laterality Date  . CARDIAC CATHETERIZATION  06/2013   Cardiac Cath: sharp Angle take-off of Large Dominant Cx: ~70-80% mid Cx bifurcation lesion (Lateral OM &  AVGCx-PL-PDA both with hairpin ostial & ~50% lesions) - Not PCI amenable due to vessel tortuosity; ~40-50% mid LAD;Ramus - no significnat diseaes; small non-dominant RCA  .  LEFT HEART CATHETERIZATION WITH CORONARY ANGIOGRAM N/A 06/16/2013   Procedure: LEFT HEART CATHETERIZATION WITH CORONARY ANGIOGRAM;  Surgeon: [name], MD;  Location: [facility] CATH LAB;  Service: Cardiovascular;  Laterality: N/A;  . TRANSTHORACIC ECHOCARDIOGRAM  06/17/2013   Normal LV size and function. EF 55-60% with no regional WMA. Grade 1 diastolic dysfunction. No significant valvular lesions  . TUBAL LIGATION     Current Outpatient Prescriptions on File Prior to Visit  Medication Sig Dispense Refill  . aspirin 81 MG tablet Take 81 mg by mouth daily.      . Calcium Carbonate-Vitamin D (CALCIUM 600 + D PO) Take 1 tablet by mouth daily.      Marland Kitchen gabapentin (NEURONTIN) 600 MG tablet TAKE 1 TABLET BY MOUTH 4 TIMES DAILY 360 tablet 1  . glipiZIDE (GLUCOTROL XL) 5 MG 24 hr tablet TAKE 1 TABLET BY MOUTH ONCE DAILY 30 tablet 3  . hydrochlorothiazide (MICROZIDE) 12.5 MG capsule TAKE 1 CAPSULE BY MOUTH ONCE DAILY 30 capsule 3  . isosorbide mononitrate (IMDUR) 30 MG 24 hr tablet TAKE 1 TABLET BY MOUTH ONCE A DAY. KEEP OFFICE VISIT. 30 tablet 11  . metFORMIN (GLUCOPHAGE) 500 MG tablet TAKE 1 TABLET BY MOUTH TWICE A DAY WITH A MEAL 180 tablet 1  . metoprolol tartrate (LOPRESSOR) 25 MG tablet TAKE 1 TABLET BY MOUTH TWICE A DAY 60 tablet 5  . Multiple Vitamin (MULTIVITAMIN) capsule Take 1 capsule by  mouth daily.      . nitroGLYCERIN (NITROSTAT) 0.4 MG SL tablet Place 1 tablet (0.4 mg total) under the tongue every 5 (five) minutes x 3 doses as needed for chest pain. 25 tablet 4  . Omega-3 Fatty Acids (FISH OIL) 1000 MG CAPS Take 1 capsule by mouth daily.      Marland Kitchen omeprazole (PRILOSEC) 20 MG capsule Take 20 mg by mouth daily.    . simvastatin (ZOCOR) 20 MG tablet TAKE 1 TABLET BY MOUTH EVERY NIGHT AT BEDTIME 90 tablet 1  . VICTOZA 18 MG/3ML SOPN INJECT 0.2 ML (1.2 MG TOTAL) INTO THE SKIN DAILY 6 mL 3   No current facility-administered medications on file prior to visit.    Allergies  Allergen Reactions  .  Neomycin Other (See Comments)    Visual disturbance and swelling in eyes   Social History   Social History  . Marital status: Married    Spouse name: N/A  . Number of children: 3  . Years of education: N/A   Occupational History  . Systems analyst    Social History Main Topics  . Smoking status: Former Smoker    Packs/day: 1.50    Years: 20.00    Types: Cigarettes    Quit date: 02/13/1986  . Smokeless tobacco: Never Used  . Alcohol use No  . Drug use: No  . Sexual activity: Yes   Other Topics Concern  . Not on file   Social History Narrative   Lives in [place] with her husband. 3 children.  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   Former 30 Pk/yr Smoker (1.5 PPD x 20 yr) - quit in 1998.     Family History  Problem Relation Age of Onset  . Asthma Maternal Grandmother   . Heart disease Maternal Grandmother   . Heart disease Mother        developed coronary dzs in her 75's.  . Clotting disorder Mother   . Heart disease Maternal Grandfather   . Diabetes Brother   . Diabetes Paternal Aunt   . CAD Brother        s/p cabg in his 42's  . CAD Brother        s/p cabg in his 41's.    Review of Systems  All other systems reviewed and are negative.      Objective:   Physical Exam  Constitutional: She is oriented to person, place, and time. She appears well-developed and well-nourished. No distress.  HENT:  Head: Normocephalic and atraumatic.  Right Ear: External ear normal.  Left Ear: External ear normal.  Nose: Nose normal.  Mouth/Throat: Oropharynx is clear and moist. No oropharyngeal exudate.  Eyes: Pupils are equal, round, and reactive to light. Conjunctivae and EOM are normal. Right eye exhibits no discharge. Left eye exhibits no discharge. No scleral icterus.  Neck: Normal range of motion. Neck supple. No JVD present. No tracheal deviation present. No thyromegaly present.  Cardiovascular: Normal rate, regular rhythm, normal heart sounds and intact distal pulses.   Exam reveals no gallop and no friction rub.   No murmur heard. Pulmonary/Chest: Effort normal and breath sounds normal. No stridor. No respiratory distress. She has no wheezes. She has no rales. She exhibits no tenderness.  Abdominal: Soft. Bowel sounds are normal. She exhibits no distension and no mass. There is no tenderness. There is no rebound and no guarding.  Musculoskeletal: She exhibits no edema, tenderness or deformity.  Lymphadenopathy:    She has no cervical adenopathy.  Neurological: She is alert and oriented to person, place, and time. She has normal reflexes. No cranial nerve deficit. She exhibits normal muscle tone. Coordination normal.  Skin: Skin is warm. No rash noted. She is not diaphoretic. No erythema. No pallor.  Psychiatric: She has a normal mood and affect. Her behavior is normal. Thought content normal.  Vitals reviewed.         Assessment & Plan:  Controlled type 2 diabetes mellitus with complication, without long-term current use of insulin (HCC)  Patient symptoms are more likely due to her long-acting nitroglycerin, Imdur, metoprolol, and gabapentin.  However she is convinced that is her diabetes medication.  Therefore we will temporarily discontinue glipizide, metformin, and Victoza.  She will replaced him with a basaglar 10 units a day.  She will call me next week and we will up titrate her insulin upon her blood sugars.  Recheck here in 2 weeks to see if her symptoms have improved.

## 2016-12-18 ENCOUNTER — Other Ambulatory Visit: Payer: Self-pay | Admitting: Family Medicine

## 2016-12-19 ENCOUNTER — Telehealth: Payer: Self-pay | Admitting: Family Medicine

## 2016-12-19 NOTE — Telephone Encounter (Signed)
These sound ok.  Continue her current insulin dose.

## 2016-12-19 NOTE — Telephone Encounter (Signed)
Pt called [facility] stating this is her BS for the past few days - [phone]

## 2016-12-20 NOTE — Telephone Encounter (Signed)
Patient aware of providers recommendations.  

## 2016-12-29 ENCOUNTER — Encounter: Payer: Self-pay | Admitting: Family Medicine

## 2016-12-29 ENCOUNTER — Ambulatory Visit: Payer: Medicare Other | Admitting: Family Medicine

## 2016-12-29 ENCOUNTER — Other Ambulatory Visit: Payer: Self-pay

## 2016-12-29 VITALS — BP 150/80 | HR 60 | Temp 97.9°F | Resp 14 | Ht 62.0 in | Wt 176.0 lb

## 2016-12-29 DIAGNOSIS — E118 Type 2 diabetes mellitus with unspecified complications: Secondary | ICD-10-CM | POA: Diagnosis not present

## 2016-12-29 NOTE — Progress Notes (Signed)
Subjective:    Patient ID: April [name], female    DOB: August 04, 1940, 76 y.o.   MRN: [id number]  Medication Refill    12/15/16 Patient states that she wants to stop her medications.  She states that she feels fine before she takes her medicines in the morning but then shortly after taking her medication, she feels lethargic, weak, tired and does not want to do anything.  I have reviewed her medication list with her extensively.  She does report some nausea diarrhea and upset stomach which could be due to metformin and Victoza.  However the majority of her symptoms are due to fatigue and lethargy which I attribute more to her blood pressure and cardiac medications.  At that time, my plan was: Patient symptoms are more likely due to her long-acting nitroglycerin, Imdur, metoprolol, and gabapentin.  However she is convinced that is her diabetes medication.  Therefore we will temporarily discontinue glipizide, metformin, and Victoza.  She will replaced him with a basaglar 10 units a day.  She will call me next week and we will up titrate her insulin upon her blood sugars.  Recheck here in 2 weeks to see if her symptoms have improved.  12/29/16 Patient states that she feels much better since discontinuing Victoza, glipizide, and metformin.  She definitely wants to refrain from using these medication.  She is currently on glargine 10 units a day.  Her fasting blood sugars are 150-170.  She is not checking her 2-hour postprandial sugars but she is interested in up titrating the insulin to replace the medications that we discontinued Past Medical History:  Diagnosis Date  . Abnormal weight gain   . Arthritis    "joints" (06/13/2013)  . COPD (chronic obstructive pulmonary disease) ([place])   . Coronary artery disease, non-occlusive 06/2013   Cardiac Cath: sharp Angle take-off of Large Dominant Cx: ~70-80% mid Cx bifurcation lesion (Lateral OM &  AVGCx-PL-PDA both with hairpin ostial & ~50% lesions) - Not PCI  amenable due to vessel tortuosity; ~40-50% mid LAD;Ramus - no significnat diseaes; small non-dominant RCA  . Diverticulosis   . GERD (gastroesophageal reflux disease)   . History of stress test    a. 09/2006 nl Dobutamine Echo  . Hyperlipidemia   . Hypertension   . NIDDM (non-insulin dependent diabetes mellitus)   . On home oxygen therapy    "2L q hs; runs into my BIPAP" (06/13/2013)  . OSA (obstructive sleep apnea)    "BIPAP w/O2" (06/13/2013)  . Tracheobronchomalacia    a. followed by [facility].    Past Surgical History:  Procedure Laterality Date  . CARDIAC CATHETERIZATION  06/2013   Cardiac Cath: sharp Angle take-off of Large Dominant Cx: ~70-80% mid Cx bifurcation lesion (Lateral OM &  AVGCx-PL-PDA both with hairpin ostial & ~50% lesions) - Not PCI amenable due to vessel tortuosity; ~40-50% mid LAD;Ramus - no significnat diseaes; small non-dominant RCA  . LEFT HEART CATHETERIZATION WITH CORONARY ANGIOGRAM N/A 06/16/2013   Performed by [name], MD at [facility] CATH LAB  . TRANSTHORACIC ECHOCARDIOGRAM  06/17/2013   Normal LV size and function. EF 55-60% with no regional WMA. Grade 1 diastolic dysfunction. No significant valvular lesions  . TUBAL LIGATION     Current Outpatient Medications on File Prior to Visit  Medication Sig Dispense Refill  . aspirin 81 MG tablet Take 81 mg by mouth daily.      . Calcium Carbonate-Vitamin D (CALCIUM 600 + D PO) Take 1  tablet by mouth daily.      Marland Kitchen gabapentin (NEURONTIN) 600 MG tablet TAKE 1 TABLET BY MOUTH 4 TIMES DAILY 360 tablet 1  . glipiZIDE (GLUCOTROL XL) 5 MG 24 hr tablet TAKE 1 TABLET BY MOUTH ONCE DAILY 30 tablet 3  . hydrochlorothiazide (MICROZIDE) 12.5 MG capsule TAKE 1 CAPSULE BY MOUTH ONCE DAILY 30 capsule 3  . Insulin Glargine (BASAGLAR KWIKPEN) 100 UNIT/ML SOPN Inject 10 Units at bedtime into the skin.    Marland Kitchen isosorbide mononitrate (IMDUR) 30 MG 24 hr tablet TAKE 1 TABLET BY MOUTH ONCE A DAY. KEEP OFFICE VISIT. 30 tablet 11  .  metFORMIN (GLUCOPHAGE) 500 MG tablet TAKE 1 TABLET BY MOUTH TWICE A DAY WITH A MEAL 180 tablet 1  . metoprolol tartrate (LOPRESSOR) 25 MG tablet TAKE 1 TABLET BY MOUTH TWICE A DAY 60 tablet 5  . Multiple Vitamin (MULTIVITAMIN) capsule Take 1 capsule by mouth daily.      . nitroGLYCERIN (NITROSTAT) 0.4 MG SL tablet Place 1 tablet (0.4 mg total) under the tongue every 5 (five) minutes x 3 doses as needed for chest pain. 25 tablet 4  . Omega-3 Fatty Acids (FISH OIL) 1000 MG CAPS Take 1 capsule by mouth daily.      Marland Kitchen omeprazole (PRILOSEC) 20 MG capsule Take 20 mg by mouth daily.    . simvastatin (ZOCOR) 20 MG tablet TAKE 1 TABLET BY MOUTH EVERY NIGHT AT BEDTIME 90 tablet 1   No current facility-administered medications on file prior to visit.    Allergies  Allergen Reactions  . Neomycin Other (See Comments)    Visual disturbance and swelling in eyes   Social History   Socioeconomic History  . Marital status: Married    Spouse name: Not on file  . Number of children: 3  . Years of education: Not on file  . Highest education level: Not on file  Social Needs  . Financial resource strain: Not on file  . Food insecurity - worry: Not on file  . Food insecurity - inability: Not on file  . Transportation needs - medical: Not on file  . Transportation needs - non-medical: Not on file  Occupational History  . Occupation: Systems analyst  Tobacco Use  . Smoking status: Former Smoker    Packs/day: 1.50    Years: 20.00    Pack years: 30.00    Types: Cigarettes    Last attempt to quit: 02/13/1986    Years since quitting: 30.8  . Smokeless tobacco: Never Used  Substance and Sexual Activity  . Alcohol use: No  . Drug use: No  . Sexual activity: Yes  Other Topics Concern  . Not on file  Social History Narrative   Lives in [place] with her husband. 3 children.  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   Former 30 Pk/yr Smoker (1.5 PPD x 20 yr) - quit in 1998.     Family History  Problem Relation  Age of Onset  . Asthma Maternal Grandmother   . Heart disease Maternal Grandmother   . Heart disease Mother        developed coronary dzs in her 82's.  . Clotting disorder Mother   . Heart disease Maternal Grandfather   . Diabetes Brother   . Diabetes Paternal Aunt   . CAD Brother        s/p cabg in his 52's  . CAD Brother        s/p cabg in his 37's.    Review of Systems  All other systems reviewed and are negative.      Objective:   Physical Exam  Constitutional: She is oriented to person, place, and time. She appears well-developed and well-nourished. No distress.  HENT:  Head: Normocephalic and atraumatic.  Right Ear: External ear normal.  Left Ear: External ear normal.  Nose: Nose normal.  Mouth/Throat: Oropharynx is clear and moist. No oropharyngeal exudate.  Eyes: Conjunctivae and EOM are normal. Pupils are equal, round, and reactive to light. Right eye exhibits no discharge. Left eye exhibits no discharge. No scleral icterus.  Neck: Normal range of motion. Neck supple. No JVD present. No tracheal deviation present. No thyromegaly present.  Cardiovascular: Normal rate, regular rhythm, normal heart sounds and intact distal pulses. Exam reveals no gallop and no friction rub.  No murmur heard. Pulmonary/Chest: Effort normal and breath sounds normal. No stridor. No respiratory distress. She has no wheezes. She has no rales. She exhibits no tenderness.  Abdominal: Soft. Bowel sounds are normal. She exhibits no distension and no mass. There is no tenderness. There is no rebound and no guarding.  Musculoskeletal: She exhibits no edema, tenderness or deformity.  Lymphadenopathy:    She has no cervical adenopathy.  Neurological: She is alert and oriented to person, place, and time. She has normal reflexes. No cranial nerve deficit. She exhibits normal muscle tone. Coordination normal.  Skin: Skin is warm. No rash noted. She is not diaphoretic. No erythema. No pallor.    Psychiatric: She has a normal mood and affect. Her behavior is normal. Thought content normal.  Vitals reviewed.         Assessment & Plan:  Controlled type 2 diabetes mellitus with complication, without long-term current use of insulin ([place])  I recommended increasing glargine by 1 unit every day until fasting blood sugars are less than 130.  I anticipate that she will need roughly 30 units or less of glargine to achieve this.  She is to call me back each week and give me an update on her dose of insulin and her fasting blood sugars.  I would stop once she achieves a fasting blood sugar of 130.  Her blood pressure today is slightly elevated.  Normally it is well controlled.  We have discussed adding losartan but at the present time the patient would like to check her blood pressures more frequently at home and then notify me of the values in 1 week

## 2017-01-12 DIAGNOSIS — E119 Type 2 diabetes mellitus without complications: Secondary | ICD-10-CM | POA: Diagnosis not present

## 2017-01-16 ENCOUNTER — Other Ambulatory Visit: Payer: Self-pay | Admitting: Cardiology

## 2017-01-16 ENCOUNTER — Other Ambulatory Visit: Payer: Self-pay | Admitting: Family Medicine

## 2017-01-30 ENCOUNTER — Telehealth: Payer: Self-pay | Admitting: Family Medicine

## 2017-01-30 NOTE — Telephone Encounter (Signed)
Patient came in today for sample of basaglar, however we had none, patient is not sure about her dosage if we call into her pharmacy, so could you call and help her with this please  [place]

## 2017-01-31 MED ORDER — BASAGLAR KWIKPEN 100 UNIT/ML ~~LOC~~ SOPN: 40.0000 [IU] | PEN_INJECTOR | Freq: Every day | SUBCUTANEOUS | 3 refills | Status: DC

## 2017-01-31 NOTE — Telephone Encounter (Signed)
Spoke to pt and she is on 36 units today and her BS readings are as follows FBS - 174,177,152,136,156,165,155,128,162,198,153 this AM

## 2017-02-01 MED ORDER — INSULIN GLARGINE 100 UNIT/ML SOLOSTAR PEN: 50.0000 [IU] | PEN_INJECTOR | Freq: Every day | SUBCUTANEOUS | 3 refills | Status: DC

## 2017-02-01 NOTE — Telephone Encounter (Signed)
[facility] called and April [name] is not covered under pt's insurance but Lantus is - medication switched.

## 2017-02-01 NOTE — Telephone Encounter (Signed)
Fasting sugars are too high, I would up basaglar to 50 units daily.

## 2017-02-01 NOTE — Telephone Encounter (Signed)
LMTRC

## 2017-02-02 NOTE — Telephone Encounter (Signed)
LMTRC

## 2017-05-17 ENCOUNTER — Other Ambulatory Visit: Payer: Self-pay | Admitting: Family Medicine

## 2017-05-21 DIAGNOSIS — E119 Type 2 diabetes mellitus without complications: Secondary | ICD-10-CM | POA: Diagnosis not present

## 2017-06-01 DIAGNOSIS — G4733 Obstructive sleep apnea (adult) (pediatric): Secondary | ICD-10-CM | POA: Diagnosis not present

## 2017-06-07 ENCOUNTER — Encounter: Payer: Self-pay | Admitting: Family Medicine

## 2017-06-07 ENCOUNTER — Ambulatory Visit: Payer: Medicare Other | Admitting: Family Medicine

## 2017-06-07 VITALS — BP 142/80 | HR 72 | Temp 98.3°F | Resp 16 | Ht 62.0 in | Wt 177.0 lb

## 2017-06-07 DIAGNOSIS — E038 Other specified hypothyroidism: Secondary | ICD-10-CM

## 2017-06-07 DIAGNOSIS — E039 Hypothyroidism, unspecified: Secondary | ICD-10-CM | POA: Diagnosis not present

## 2017-06-07 DIAGNOSIS — E118 Type 2 diabetes mellitus with unspecified complications: Secondary | ICD-10-CM | POA: Diagnosis not present

## 2017-06-07 DIAGNOSIS — E7800 Pure hypercholesterolemia, unspecified: Secondary | ICD-10-CM | POA: Diagnosis not present

## 2017-06-07 DIAGNOSIS — I10 Essential (primary) hypertension: Secondary | ICD-10-CM | POA: Diagnosis not present

## 2017-06-07 LAB — CBC WITH DIFFERENTIAL/PLATELET
BASOS ABS: 30 {cells}/uL (ref 0–200)
Basophils Relative: 0.4 %
EOS ABS: 133 {cells}/uL (ref 15–500)
Eosinophils Relative: 1.8 %
HCT: 43.8 % (ref 35.0–45.0)
Hemoglobin: 14.6 g/dL (ref 11.7–15.5)
Lymphs Abs: 3049 cells/uL (ref 850–3900)
MCH: 29 pg (ref 27.0–33.0)
MCHC: 33.3 g/dL (ref 32.0–36.0)
MCV: 86.9 fL (ref 80.0–100.0)
MONOS PCT: 7.7 %
MPV: 10.9 fL (ref 7.5–12.5)
NEUTROS ABS: 3619 {cells}/uL (ref 1500–7800)
Neutrophils Relative %: 48.9 %
Platelets: 227 10*3/uL (ref 140–400)
RBC: 5.04 10*6/uL (ref 3.80–5.10)
RDW: 12.6 % (ref 11.0–15.0)
Total Lymphocyte: 41.2 %
WBC: 7.4 10*3/uL (ref 3.8–10.8)
WBCMIX: 570 {cells}/uL (ref 200–950)

## 2017-06-07 LAB — COMPLETE METABOLIC PANEL WITH GFR
AG RATIO: 1.4 (calc) (ref 1.0–2.5)
ALKALINE PHOSPHATASE (APISO): 53 U/L (ref 33–130)
ALT: 22 U/L (ref 6–29)
AST: 19 U/L (ref 10–35)
Albumin: 4.1 g/dL (ref 3.6–5.1)
BILIRUBIN TOTAL: 0.4 mg/dL (ref 0.2–1.2)
BUN / CREAT RATIO: 35 (calc) — AB (ref 6–22)
BUN: 18 mg/dL (ref 7–25)
CHLORIDE: 100 mmol/L (ref 98–110)
CO2: 35 mmol/L — ABNORMAL HIGH (ref 20–32)
Calcium: 9.6 mg/dL (ref 8.6–10.4)
Creat: 0.52 mg/dL — ABNORMAL LOW (ref 0.60–0.93)
GFR, Est African American: 108 mL/min/{1.73_m2} (ref >=60)
GFR, Est Non African American: 93 mL/min/{1.73_m2} (ref >=60)
GLOBULIN: 2.9 g/dL (ref 1.9–3.7)
GLUCOSE: 148 mg/dL — AB (ref 65–99)
POTASSIUM: 4.3 mmol/L (ref 3.5–5.3)
SODIUM: 142 mmol/L (ref 135–146)
Total Protein: 7 g/dL (ref 6.1–8.1)

## 2017-06-07 LAB — LIPID PANEL
CHOLESTEROL: 188 mg/dL (ref <200)
HDL: 52 mg/dL (ref >50)
LDL Cholesterol (Calc): 93 mg/dL (calc)
Non-HDL Cholesterol (Calc): 136 mg/dL (calc) — ABNORMAL HIGH (ref <130)
Total CHOL/HDL Ratio: 3.6 (calc) (ref <5.0)
Triglycerides: 324 mg/dL — ABNORMAL HIGH (ref <150)

## 2017-06-07 NOTE — Progress Notes (Signed)
Subjective:    Patient ID: April [name], female    DOB: [birth year], 77 y.o.   MRN: [id number]  Medication Refill    12/15/16 Patient states that she wants to stop her medications.  She states that she feels fine before she takes her medicines in the morning but then shortly after taking her medication, she feels lethargic, weak, tired and does not want to do anything.  I have reviewed her medication list with her extensively.  She does report some nausea diarrhea and upset stomach which could be due to metformin and Victoza.  However the majority of her symptoms are due to fatigue and lethargy which I attribute more to her blood pressure and cardiac medications.  At that time, my plan was: Patient symptoms are more likely due to her long-acting nitroglycerin, Imdur, metoprolol, and gabapentin.  However she is convinced that is her diabetes medication.  Therefore we will temporarily discontinue glipizide, metformin, and Victoza.  She will replaced him with a basaglar 10 units a day.  She will call me next week and we will up titrate her insulin upon her blood sugars.  Recheck here in 2 weeks to see if her symptoms have improved.  12/29/16 Patient states that she feels much better since discontinuing Victoza, glipizide, and metformin.  She definitely wants to refrain from using these medication.  She is currently on glargine 10 units a day.  Her fasting blood sugars are 150-170.  She is not checking her 2-hour postprandial sugars but she is interested in up titrating the insulin to replace the medications that we discontinued.  At that time, my plan was: I recommended increasing glargine by 1 unit every day until fasting blood sugars are less than 130.  I anticipate that she will need roughly 30 units or less of glargine to achieve this.  She is to call me back each week and give me an update on her dose of insulin and her fasting blood sugars.  I would stop once she achieves a fasting blood sugar of  130.  Her blood pressure today is slightly elevated.  Normally it is well controlled.  We have discussed adding losartan but at the present time the patient would like to check her blood pressures more frequently at home and then notify me of the values in 1 week  06/07/17 Here for follow up.  Patient states she is doing great.  She denies any hypoglycemic episodes.  Ultimately she is on 40 units of Lantus a day.  Fasting blood sugars are between 130 and 150.  2-hour postprandial sugars are less than 160.  She denies any polyuria, polydipsia, or blurry vision.  She does complain of neuropathic pain in both hands primarily at night.  She has a history of peripheral neuropathy for which she takes gabapentin 600 mg 3 times daily.  However this neuropathy in the hands just began in the last few months.  She denies any chest pain shortness of breath or dyspnea on exertion.  She is overdue for diabetic eye exam.  Colonoscopy is up-to-date.  Blood pressure today is adequately controlled.  She denies any myalgias or right upper quadrant pain on her statin.  Diabetic foot exam was performed and is normal except for neuropathy.  She has little to no sensation to 10 g monofilament distal to the MTP joints on any toe Past Medical History:  Diagnosis Date  . Abnormal weight gain   . Arthritis    "joints" (06/13/2013)  .  COPD (chronic obstructive pulmonary disease) ([place])   . Coronary artery disease, non-occlusive 06/2013   Cardiac Cath: sharp Angle take-off of Large Dominant Cx: ~70-80% mid Cx bifurcation lesion (Lateral OM &  AVGCx-PL-PDA both with hairpin ostial & ~50% lesions) - Not PCI amenable due to vessel tortuosity; ~40-50% mid LAD;Ramus - no significnat diseaes; small non-dominant RCA  . Diverticulosis   . GERD (gastroesophageal reflux disease)   . History of stress test    a. 09/2006 nl Dobutamine Echo  . Hyperlipidemia   . Hypertension   . NIDDM (non-insulin dependent diabetes mellitus)   . On home oxygen  therapy    "2L q hs; runs into my BIPAP" (06/13/2013)  . OSA (obstructive sleep apnea)    "BIPAP w/O2" (06/13/2013)  . Tracheobronchomalacia    a. followed by [facility].    Past Surgical History:  Procedure Laterality Date  . CARDIAC CATHETERIZATION  06/2013   Cardiac Cath: sharp Angle take-off of Large Dominant Cx: ~70-80% mid Cx bifurcation lesion (Lateral OM &  AVGCx-PL-PDA both with hairpin ostial & ~50% lesions) - Not PCI amenable due to vessel tortuosity; ~40-50% mid LAD;Ramus - no significnat diseaes; small non-dominant RCA  . LEFT HEART CATHETERIZATION WITH CORONARY ANGIOGRAM N/A 06/16/2013   Procedure: LEFT HEART CATHETERIZATION WITH CORONARY ANGIOGRAM;  Surgeon: [name], MD;  Location: [facility] CATH LAB;  Service: Cardiovascular;  Laterality: N/A;  . TRANSTHORACIC ECHOCARDIOGRAM  06/17/2013   Normal LV size and function. EF 55-60% with no regional WMA. Grade 1 diastolic dysfunction. No significant valvular lesions  . TUBAL LIGATION     Current Outpatient Medications on File Prior to Visit  Medication Sig Dispense Refill  . aspirin 81 MG tablet Take 81 mg by mouth daily.      . Calcium Carbonate-Vitamin D (CALCIUM 600 + D PO) Take 1 tablet by mouth daily.      Marland Kitchen gabapentin (NEURONTIN) 600 MG tablet TAKE 1 TABLET BY MOUTH 4 TIMES DAILY 360 tablet 1  . glipiZIDE (GLUCOTROL XL) 5 MG 24 hr tablet TAKE 1 TABLET BY MOUTH ONCE DAILY 30 tablet 3  . hydrochlorothiazide (MICROZIDE) 12.5 MG capsule TAKE 1 CAPSULE BY MOUTH ONCE DAILY 90 capsule 3  . Insulin Glargine (LANTUS SOLOSTAR) 100 UNIT/ML Solostar Pen Inject 50 Units into the skin daily at 10 pm. 5 pen 3  . isosorbide mononitrate (IMDUR) 30 MG 24 hr tablet TAKE 1 TABLET BY MOUTH ONCE A DAY. KEEP OFFICE VISIT. 30 tablet 11  . metFORMIN (GLUCOPHAGE) 500 MG tablet TAKE 1 TABLET BY MOUTH TWICE A DAY WITH A MEAL 180 tablet 1  . metoprolol tartrate (LOPRESSOR) 25 MG tablet TAKE 1 TABLET BY MOUTH TWICE (2) DAILY 60 tablet 5  . Multiple  Vitamin (MULTIVITAMIN) capsule Take 1 capsule by mouth daily.      . nitroGLYCERIN (NITROSTAT) 0.4 MG SL tablet Place 1 tablet (0.4 mg total) under the tongue every 5 (five) minutes x 3 doses as needed for chest pain. 25 tablet 4  . Omega-3 Fatty Acids (FISH OIL) 1000 MG CAPS Take 1 capsule by mouth daily.      Marland Kitchen omeprazole (PRILOSEC) 20 MG capsule Take 20 mg by mouth daily.    . simvastatin (ZOCOR) 20 MG tablet TAKE 1 TABLET BY MOUTH EVERY NIGHT AT BEDTIME 90 tablet 1   No current facility-administered medications on file prior to visit.    Allergies  Allergen Reactions  . Neomycin Other (See Comments)    Visual disturbance and swelling  in eyes   Social History   Socioeconomic History  . Marital status: Married    Spouse name: Not on file  . Number of children: 3  . Years of education: Not on file  . Highest education level: Not on file  Occupational History  . Occupation: Systems analyst  Social Needs  . Financial resource strain: Not on file  . Food insecurity:    Worry: Not on file    Inability: Not on file  . Transportation needs:    Medical: Not on file    Non-medical: Not on file  Tobacco Use  . Smoking status: Former Smoker    Packs/day: 1.50    Years: 20.00    Pack years: 30.00    Types: Cigarettes    Last attempt to quit: 02/13/1986    Years since quitting: 31.3  . Smokeless tobacco: Never Used  Substance and Sexual Activity  . Alcohol use: No  . Drug use: No  . Sexual activity: Yes  Lifestyle  . Physical activity:    Days per week: Not on file    Minutes per session: Not on file  . Stress: Not on file  Relationships  . Social connections:    Talks on phone: Not on file    Gets together: Not on file    Attends religious service: Not on file    Active member of club or organization: Not on file    Attends meetings of clubs or organizations: Not on file    Relationship status: Not on file  . Intimate partner violence:    Fear of current or ex partner:  Not on file    Emotionally abused: Not on file    Physically abused: Not on file    Forced sexual activity: Not on file  Other Topics Concern  . Not on file  Social History Narrative   Lives in [place] with her husband. 3 children.  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   Former 30 Pk/yr Smoker (1.5 PPD x 20 yr) - quit in 1998.     Family History  Problem Relation Age of Onset  . Asthma Maternal Grandmother   . Heart disease Maternal Grandmother   . Heart disease Mother        developed coronary dzs in her 40's.  . Clotting disorder Mother   . Heart disease Maternal Grandfather   . Diabetes Brother   . Diabetes Paternal Aunt   . CAD Brother        s/p cabg in his 47's  . CAD Brother        s/p cabg in his 40's.    Review of Systems  All other systems reviewed and are negative.      Objective:   Physical Exam  Constitutional: She is oriented to person, place, and time. She appears well-developed and well-nourished. No distress.  HENT:  Head: Normocephalic and atraumatic.  Right Ear: External ear normal.  Left Ear: External ear normal.  Nose: Nose normal.  Mouth/Throat: Oropharynx is clear and moist. No oropharyngeal exudate.  Eyes: Pupils are equal, round, and reactive to light. Conjunctivae and EOM are normal. Right eye exhibits no discharge. Left eye exhibits no discharge. No scleral icterus.  Neck: Normal range of motion. Neck supple. No JVD present. No tracheal deviation present. No thyromegaly present.  Cardiovascular: Normal rate, regular rhythm, normal heart sounds and intact distal pulses. Exam reveals no gallop and no friction rub.  No murmur heard. Pulmonary/Chest: Effort normal and breath  sounds normal. No stridor. No respiratory distress. She has no wheezes. She has no rales. She exhibits no tenderness.  Abdominal: Soft. Bowel sounds are normal. She exhibits no distension and no mass. There is no tenderness. There is no rebound and no guarding.  Musculoskeletal:  She exhibits no edema, tenderness or deformity.  Lymphadenopathy:    She has no cervical adenopathy.  Neurological: She is alert and oriented to person, place, and time. She has normal reflexes. No cranial nerve deficit. She exhibits normal muscle tone. Coordination normal.  Skin: Skin is warm. No rash noted. She is not diaphoretic. No erythema. No pallor.  Psychiatric: She has a normal mood and affect. Her behavior is normal. Thought content normal.  Vitals reviewed.         Assessment & Plan:  Controlled type 2 diabetes mellitus with complication, without long-term current use of insulin ([place]) - Plan: CBC with Differential/Platelet, COMPLETE METABOLIC PANEL WITH GFR, Lipid panel, Microalbumin, urine, Hemoglobin A1c  Subclinical hypothyroidism - Plan: TSH  Essential hypertension  Pure hypercholesterolemia  Sugars sound borderline controlled.  Goal hemoglobin A1c is less than 7.  Given her age I would except 7.5.  She is compliant with an aspirin.  I will schedule the patient for a diabetic eye exam.  Check fasting lipid panel.  Goal LDL cholesterol is less than 100.  The patient for diabetic eye exam.  Check urine microalbumin.  Blood pressure is adequately controlled.  Check a TSH to monitor her subclinical hypothyroidism.  Immunizations are up-to-date.

## 2017-06-08 LAB — MICROALBUMIN, URINE: Microalb, Ur: 1.1 mg/dL

## 2017-06-08 LAB — TSH: TSH: 3.42 mIU/L (ref 0.40–4.50)

## 2017-06-08 LAB — HEMOGLOBIN A1C
HEMOGLOBIN A1C: 7.8 %{Hb} — AB (ref <5.7)
Mean Plasma Glucose: 177 (calc)
eAG (mmol/L): 9.8 (calc)

## 2017-06-27 ENCOUNTER — Other Ambulatory Visit: Payer: Self-pay | Admitting: Family Medicine

## 2017-06-27 DIAGNOSIS — H2511 Age-related nuclear cataract, right eye: Secondary | ICD-10-CM | POA: Diagnosis not present

## 2017-06-27 DIAGNOSIS — H2512 Age-related nuclear cataract, left eye: Secondary | ICD-10-CM | POA: Diagnosis not present

## 2017-06-27 DIAGNOSIS — E119 Type 2 diabetes mellitus without complications: Secondary | ICD-10-CM | POA: Diagnosis not present

## 2017-06-27 DIAGNOSIS — H43822 Vitreomacular adhesion, left eye: Secondary | ICD-10-CM | POA: Diagnosis not present

## 2017-06-27 LAB — HM DIABETES EYE EXAM

## 2017-06-27 MED ORDER — SIMVASTATIN 20 MG PO TABS: 20.0000 mg | ORAL_TABLET | Freq: Every day | ORAL | 1 refills | Status: DC

## 2017-06-28 ENCOUNTER — Telehealth: Payer: Self-pay | Admitting: Family Medicine

## 2017-06-28 MED ORDER — SIMVASTATIN 40 MG PO TABS: 40.0000 mg | ORAL_TABLET | Freq: Every day | ORAL | 3 refills | Status: DC

## 2017-06-28 NOTE — Telephone Encounter (Signed)
New dosage sent to pharm

## 2017-06-28 NOTE — Telephone Encounter (Signed)
pts simvastatin was called in to [facility], pt states she is suppose to have 40mg  not 20mg . Please fix and resend to [facility].

## 2017-07-06 ENCOUNTER — Encounter: Payer: Self-pay | Admitting: *Deleted

## 2017-07-17 ENCOUNTER — Other Ambulatory Visit: Payer: Self-pay | Admitting: Cardiology

## 2017-07-24 ENCOUNTER — Other Ambulatory Visit: Payer: Self-pay | Admitting: Family Medicine

## 2017-08-01 DIAGNOSIS — E119 Type 2 diabetes mellitus without complications: Secondary | ICD-10-CM | POA: Diagnosis not present

## 2017-08-01 DIAGNOSIS — H25812 Combined forms of age-related cataract, left eye: Secondary | ICD-10-CM | POA: Diagnosis not present

## 2017-08-01 DIAGNOSIS — D3131 Benign neoplasm of right choroid: Secondary | ICD-10-CM | POA: Diagnosis not present

## 2017-08-01 DIAGNOSIS — D3132 Benign neoplasm of left choroid: Secondary | ICD-10-CM | POA: Diagnosis not present

## 2017-08-01 LAB — HM DIABETES EYE EXAM

## 2017-08-15 ENCOUNTER — Encounter: Payer: Self-pay | Admitting: *Deleted

## 2017-09-25 ENCOUNTER — Other Ambulatory Visit: Payer: Self-pay | Admitting: Family Medicine

## 2017-09-25 DIAGNOSIS — Z1231 Encounter for screening mammogram for malignant neoplasm of breast: Secondary | ICD-10-CM

## 2017-11-06 ENCOUNTER — Ambulatory Visit
Admission: RE | Admit: 2017-11-06 | Discharge: 2017-11-06 | Disposition: A | Discharge status: Home / Self Care | Payer: Medicare Other | Source: Ambulatory Visit | Attending: Family Medicine | Admitting: Family Medicine

## 2017-11-06 DIAGNOSIS — Z1231 Encounter for screening mammogram for malignant neoplasm of breast: Secondary | ICD-10-CM | POA: Diagnosis not present

## 2017-11-07 ENCOUNTER — Other Ambulatory Visit: Payer: Self-pay | Admitting: Family Medicine

## 2017-11-07 DIAGNOSIS — R928 Other abnormal and inconclusive findings on diagnostic imaging of breast: Secondary | ICD-10-CM

## 2017-11-14 ENCOUNTER — Ambulatory Visit
Admission: RE | Admit: 2017-11-14 | Discharge: 2017-11-14 | Disposition: A | Discharge status: Home / Self Care | Payer: Medicare Other | Source: Ambulatory Visit | Attending: Family Medicine | Admitting: Family Medicine

## 2017-11-14 DIAGNOSIS — R922 Inconclusive mammogram: Secondary | ICD-10-CM | POA: Diagnosis not present

## 2017-11-14 DIAGNOSIS — R928 Other abnormal and inconclusive findings on diagnostic imaging of breast: Secondary | ICD-10-CM

## 2017-11-14 DIAGNOSIS — N6002 Solitary cyst of left breast: Secondary | ICD-10-CM | POA: Diagnosis not present

## 2017-11-26 ENCOUNTER — Other Ambulatory Visit: Payer: Self-pay | Admitting: Cardiology

## 2017-12-10 ENCOUNTER — Ambulatory Visit
Admission: RE | Admit: 2017-12-10 | Discharge: 2017-12-10 | Disposition: A | Discharge status: Home / Self Care | Payer: Medicare Other | Source: Ambulatory Visit | Attending: Family Medicine | Admitting: Family Medicine

## 2017-12-10 ENCOUNTER — Encounter: Payer: Self-pay | Admitting: Family Medicine

## 2017-12-10 ENCOUNTER — Ambulatory Visit (INDEPENDENT_AMBULATORY_CARE_PROVIDER_SITE_OTHER): Payer: Medicare Other | Admitting: Family Medicine

## 2017-12-10 ENCOUNTER — Other Ambulatory Visit: Payer: Self-pay

## 2017-12-10 VITALS — BP 140/76 | HR 60 | Temp 97.8°F | Resp 14 | Ht 62.0 in | Wt 179.0 lb

## 2017-12-10 DIAGNOSIS — I10 Essential (primary) hypertension: Secondary | ICD-10-CM

## 2017-12-10 DIAGNOSIS — E118 Type 2 diabetes mellitus with unspecified complications: Secondary | ICD-10-CM | POA: Diagnosis not present

## 2017-12-10 DIAGNOSIS — E039 Hypothyroidism, unspecified: Secondary | ICD-10-CM | POA: Diagnosis not present

## 2017-12-10 DIAGNOSIS — M5432 Sciatica, left side: Secondary | ICD-10-CM

## 2017-12-10 DIAGNOSIS — E7800 Pure hypercholesterolemia, unspecified: Secondary | ICD-10-CM

## 2017-12-10 DIAGNOSIS — M47816 Spondylosis without myelopathy or radiculopathy, lumbar region: Secondary | ICD-10-CM | POA: Diagnosis not present

## 2017-12-10 DIAGNOSIS — Z23 Encounter for immunization: Secondary | ICD-10-CM

## 2017-12-10 DIAGNOSIS — E038 Other specified hypothyroidism: Secondary | ICD-10-CM

## 2017-12-10 DIAGNOSIS — R0789 Other chest pain: Secondary | ICD-10-CM

## 2017-12-10 NOTE — Progress Notes (Signed)
Subjective:    Patient ID: April [name], female    DOB: [birth year], 77 y.o.   MRN: [id number]  Medication Refill    12/15/16 Patient states that she wants to stop her medications.  She states that she feels fine before she takes her medicines in the morning but then shortly after taking her medication, she feels lethargic, weak, tired and does not want to do anything.  I have reviewed her medication list with her extensively.  She does report some nausea diarrhea and upset stomach which could be due to metformin and Victoza.  However the majority of her symptoms are due to fatigue and lethargy which I attribute more to her blood pressure and cardiac medications.  At that time, my plan was: Patient symptoms are more likely due to her long-acting nitroglycerin, Imdur, metoprolol, and gabapentin.  However she is convinced that is her diabetes medication.  Therefore we will temporarily discontinue glipizide, metformin, and Victoza.  She will replaced him with a basaglar 10 units a day.  She will call me next week and we will up titrate her insulin upon her blood sugars.  Recheck here in 2 weeks to see if her symptoms have improved.  12/29/16 Patient states that she feels much better since discontinuing Victoza, glipizide, and metformin.  She definitely wants to refrain from using these medication.  She is currently on glargine 10 units a day.  Her fasting blood sugars are 150-170.  She is not checking her 2-hour postprandial sugars but she is interested in up titrating the insulin to replace the medications that we discontinued.  At that time, my plan was: I recommended increasing glargine by 1 unit every day until fasting blood sugars are less than 130.  I anticipate that she will need roughly 30 units or less of glargine to achieve this.  She is to call me back each week and give me an update on her dose of insulin and her fasting blood sugars.  I would stop once she achieves a fasting blood sugar of  130.  Her blood pressure today is slightly elevated.  Normally it is well controlled.  We have discussed adding losartan but at the present time the patient would like to check her blood pressures more frequently at home and then notify me of the values in 1 week  06/07/17 Here for follow up.  Patient states she is doing great.  She denies any hypoglycemic episodes.  Ultimately she is on 40 units of Lantus a day.  Fasting blood sugars are between 130 and 150.  2-hour postprandial sugars are less than 160.  She denies any polyuria, polydipsia, or blurry vision.  She does complain of neuropathic pain in both hands primarily at night.  She has a history of peripheral neuropathy for which she takes gabapentin 600 mg 3 times daily.  However this neuropathy in the hands just began in the last few months.  She denies any chest pain shortness of breath or dyspnea on exertion.  She is overdue for diabetic eye exam.  Colonoscopy is up-to-date.  Blood pressure today is adequately controlled.  She denies any myalgias or right upper quadrant pain on her statin.  Diabetic foot exam was performed and is normal except for neuropathy.  She has little to no sensation to 10 g monofilament distal to the MTP joints on any toe.  At that time, my plan was: Sugars sound borderline controlled.  Goal hemoglobin A1c is less than 7.  Given her age I would except 7.5.  She is compliant with an aspirin.  I will schedule the patient for a diabetic eye exam.  Check fasting lipid panel.  Goal LDL cholesterol is less than 100.  The patient for diabetic eye exam.  Check urine microalbumin.  Blood pressure is adequately controlled.  Check a TSH to monitor her subclinical hypothyroidism.  Immunizations are up-to-date.  12/10/17 Patient is here today for her regular follow-up.  However she has 2 specific issues.  #1 she reports an atypical pressure-like sensation in her upper chest that radiates into the left side of her neck with exercise.  It  occurs when she is walking.  There is also shortness of breath with it.  She is short of breath at baseline however it worsens with activity.  If she sits down, the pressure sensation in her upper chest and the left side of her neck will improve.  She denies any chest pain or shortness of breath or pressure in her neck at rest.  Therefore this is concerning for possible atypical angina.  It is atypical given its location however it is concerning given its relationship to exercise and shortness of breath.  She has numerous cardiovascular risk factors including hypertension, diabetes, hyperlipidemia.  She denies any hypoglycemic episodes.  She denies any polyuria, polydipsia, or blurry vision.  Her blood pressure today is well controlled at 140/76.  She is due to recheck her fasting lipid panel along with her hemoglobin A1c.  She is also due today to receive a flu shot.  She also complains of pain in her lower back that radiates into her posterior left hip and down the lateral aspect of her left leg.  This occurs with prolonged standing.  She also reports pain and stiffness in the lumbar paraspinal muscles after sitting for prolonged period of time.  She reports subjective weakness in both legs.  She denies any saddle anesthesia or bowel or bladder incontinence Past Medical History:  Diagnosis Date  . Abnormal weight gain   . Arthritis    "joints" (06/13/2013)  . COPD (chronic obstructive pulmonary disease) ([place])   . Coronary artery disease, non-occlusive 06/2013   Cardiac Cath: sharp Angle take-off of Large Dominant Cx: ~70-80% mid Cx bifurcation lesion (Lateral OM &  AVGCx-PL-PDA both with hairpin ostial & ~50% lesions) - Not PCI amenable due to vessel tortuosity; ~40-50% mid LAD;Ramus - no significnat diseaes; small non-dominant RCA  . Diverticulosis   . GERD (gastroesophageal reflux disease)   . History of stress test    a. 09/2006 nl Dobutamine Echo  . Hyperlipidemia   . Hypertension   . NIDDM  (non-insulin dependent diabetes mellitus)   . On home oxygen therapy    "2L q hs; runs into my BIPAP" (06/13/2013)  . OSA (obstructive sleep apnea)    "BIPAP w/O2" (06/13/2013)  . Tracheobronchomalacia    a. followed by [facility].    Past Surgical History:  Procedure Laterality Date  . CARDIAC CATHETERIZATION  06/2013   Cardiac Cath: sharp Angle take-off of Large Dominant Cx: ~70-80% mid Cx bifurcation lesion (Lateral OM &  AVGCx-PL-PDA both with hairpin ostial & ~50% lesions) - Not PCI amenable due to vessel tortuosity; ~40-50% mid LAD;Ramus - no significnat diseaes; small non-dominant RCA  . LEFT HEART CATHETERIZATION WITH CORONARY ANGIOGRAM N/A 06/16/2013   Procedure: LEFT HEART CATHETERIZATION WITH CORONARY ANGIOGRAM;  Surgeon: [name], MD;  Location: [facility] CATH LAB;  Service: Cardiovascular;  Laterality: N/A;  .  TRANSTHORACIC ECHOCARDIOGRAM  06/17/2013   Normal LV size and function. EF 55-60% with no regional WMA. Grade 1 diastolic dysfunction. No significant valvular lesions  . TUBAL LIGATION     Current Outpatient Medications on File Prior to Visit  Medication Sig Dispense Refill  . aspirin 81 MG tablet Take 81 mg by mouth daily.      . Calcium Carbonate-Vitamin D (CALCIUM 600 + D PO) Take 1 tablet by mouth daily.      Marland Kitchen gabapentin (NEURONTIN) 600 MG tablet TAKE 1 TABLET BY MOUTH 4 TIMES DAILY 360 tablet 1  . hydrochlorothiazide (MICROZIDE) 12.5 MG capsule TAKE 1 CAPSULE BY MOUTH ONCE DAILY 90 capsule 3  . isosorbide mononitrate (IMDUR) 30 MG 24 hr tablet TAKE 1 TABLET BY MOUTH ONCE DAILY 30 tablet 0  . LANTUS SOLOSTAR 100 UNIT/ML Solostar Pen INJECT 40 UNITS UNDER THE SKIN ONCE EVERY NIGHT AT BEDTIME 15 mL 3  . metoprolol tartrate (LOPRESSOR) 25 MG tablet TAKE 1 TABLET BY MOUTH TWICE DAILY 60 tablet 5  . Multiple Vitamin (MULTIVITAMIN) capsule Take 1 capsule by mouth daily.      . nitroGLYCERIN (NITROSTAT) 0.4 MG SL tablet Place 1 tablet (0.4 mg total) under the tongue every 5  (five) minutes x 3 doses as needed for chest pain. 25 tablet 4  . Omega-3 Fatty Acids (FISH OIL) 1000 MG CAPS Take 1 capsule by mouth daily.      Marland Kitchen omeprazole (PRILOSEC) 20 MG capsule Take 20 mg by mouth daily.    . simvastatin (ZOCOR) 40 MG tablet Take 1 tablet (40 mg total) by mouth at bedtime. 90 tablet 3   No current facility-administered medications on file prior to visit.    Allergies  Allergen Reactions  . Neomycin Other (See Comments)    Visual disturbance and swelling in eyes   Social History   Socioeconomic History  . Marital status: Married    Spouse name: Not on file  . Number of children: 3  . Years of education: Not on file  . Highest education level: Not on file  Occupational History  . Occupation: Systems analyst  Social Needs  . Financial resource strain: Not on file  . Food insecurity:    Worry: Not on file    Inability: Not on file  . Transportation needs:    Medical: Not on file    Non-medical: Not on file  Tobacco Use  . Smoking status: Former Smoker    Packs/day: 1.50    Years: 20.00    Pack years: 30.00    Types: Cigarettes    Last attempt to quit: 02/13/1986    Years since quitting: 31.8  . Smokeless tobacco: Never Used  Substance and Sexual Activity  . Alcohol use: No  . Drug use: No  . Sexual activity: Yes  Lifestyle  . Physical activity:    Days per week: Not on file    Minutes per session: Not on file  . Stress: Not on file  Relationships  . Social connections:    Talks on phone: Not on file    Gets together: Not on file    Attends religious service: Not on file    Active member of club or organization: Not on file    Attends meetings of clubs or organizations: Not on file    Relationship status: Not on file  . Intimate partner violence:    Fear of current or ex partner: Not on file    Emotionally abused: Not  on file    Physically abused: Not on file    Forced sexual activity: Not on file  Other Topics Concern  . Not on file    Social History Narrative   Lives in [place] with her husband. 3 children.  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   Former 30 Pk/yr Smoker (1.5 PPD x 20 yr) - quit in 1998.     Family History  Problem Relation Age of Onset  . Asthma Maternal Grandmother   . Heart disease Maternal Grandmother   . Heart disease Mother        developed coronary dzs in her 79's.  . Clotting disorder Mother   . Heart disease Maternal Grandfather   . Diabetes Brother   . Diabetes Paternal Aunt   . CAD Brother        s/p cabg in his 41's  . CAD Brother        s/p cabg in his 50's.    Review of Systems  All other systems reviewed and are negative.      Objective:   Physical Exam  Constitutional: She is oriented to person, place, and time. She appears well-developed and well-nourished. No distress.  HENT:  Head: Normocephalic and atraumatic.  Right Ear: External ear normal.  Left Ear: External ear normal.  Nose: Nose normal.  Mouth/Throat: Oropharynx is clear and moist. No oropharyngeal exudate.  Eyes: Pupils are equal, round, and reactive to light. Conjunctivae and EOM are normal. Right eye exhibits no discharge. Left eye exhibits no discharge. No scleral icterus.  Neck: Normal range of motion. Neck supple. No JVD present. No tracheal deviation present. No thyromegaly present.  Cardiovascular: Normal rate, regular rhythm, normal heart sounds and intact distal pulses. Exam reveals no gallop and no friction rub.  No murmur heard. Pulmonary/Chest: Effort normal and breath sounds normal. No stridor. No respiratory distress. She has no wheezes. She has no rales. She exhibits no tenderness.  Abdominal: Soft. Bowel sounds are normal. She exhibits no distension and no mass. There is no tenderness. There is no rebound and no guarding.  Musculoskeletal: She exhibits no edema, tenderness or deformity.  Lymphadenopathy:    She has no cervical adenopathy.  Neurological: She is alert and oriented to person, place,  and time. She has normal reflexes. No cranial nerve deficit. She exhibits normal muscle tone. Coordination normal.  Skin: Skin is warm. No rash noted. She is not diaphoretic. No erythema. No pallor.  Psychiatric: She has a normal mood and affect. Her behavior is normal. Thought content normal.  Vitals reviewed.         Assessment & Plan:  Controlled type 2 diabetes mellitus with complication, without long-term current use of insulin ([place]) - Plan: CBC with Differential/Platelet, COMPLETE METABOLIC PANEL WITH GFR, Lipid panel, Hemoglobin A1c, TSH, Ambulatory referral to Cardiology  Subclinical hypothyroidism - Plan: TSH  Essential hypertension - Plan: Ambulatory referral to Cardiology  Pure hypercholesterolemia - Plan: Ambulatory referral to Cardiology  Need for immunization against influenza - Plan: Flu Vaccine QUAD 36+ mos IM  Left sided sciatica - Plan: DG Lumbar Spine Complete  Atypical chest pain - Plan: Ambulatory referral to Cardiology  Given the atypical angina type symptoms, her age, her diabetes, I have recommended a cardiology consultation for a stress test to rule out underlying ischemia.  Her blood pressure today is well controlled at 140/76.  I will check a fasting lipid panel.  Goal LDL cholesterol is less than 100 and is close to 70 as  possible.  I will monitor her hypothyroidism with a TSH.  I will check hemoglobin A1c.  Ideally I like her A1c to be less than 7.  Patient received her flu shot today.  I will also check hemoglobin A1c.  I will also schedule the patient for a lumbar spine x-ray.  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

## 2017-12-11 LAB — CBC WITH DIFFERENTIAL/PLATELET
BASOS ABS: 39 {cells}/uL (ref 0–200)
Basophils Relative: 0.5 %
EOS ABS: 109 {cells}/uL (ref 15–500)
EOS PCT: 1.4 %
HEMATOCRIT: 46.7 % — AB (ref 35.0–45.0)
Hemoglobin: 15.3 g/dL (ref 11.7–15.5)
LYMPHS ABS: 2800 {cells}/uL (ref 850–3900)
MCH: 29.7 pg (ref 27.0–33.0)
MCHC: 32.8 g/dL (ref 32.0–36.0)
MCV: 90.7 fL (ref 80.0–100.0)
MONOS PCT: 8 %
MPV: 11.1 fL (ref 7.5–12.5)
NEUTROS PCT: 54.2 %
Neutro Abs: 4228 cells/uL (ref 1500–7800)
Platelets: 244 10*3/uL (ref 140–400)
RBC: 5.15 10*6/uL — ABNORMAL HIGH (ref 3.80–5.10)
RDW: 12 % (ref 11.0–15.0)
Total Lymphocyte: 35.9 %
WBC mixed population: 624 cells/uL (ref 200–950)
WBC: 7.8 10*3/uL (ref 3.8–10.8)

## 2017-12-11 LAB — COMPLETE METABOLIC PANEL WITH GFR
AG Ratio: 1.4 (calc) (ref 1.0–2.5)
ALT: 27 U/L (ref 6–29)
AST: 20 U/L (ref 10–35)
Albumin: 4.1 g/dL (ref 3.6–5.1)
Alkaline phosphatase (APISO): 51 U/L (ref 33–130)
BUN: 14 mg/dL (ref 7–25)
CO2: 33 mmol/L — ABNORMAL HIGH (ref 20–32)
Calcium: 9.8 mg/dL (ref 8.6–10.4)
Chloride: 99 mmol/L (ref 98–110)
Creat: 0.63 mg/dL (ref 0.60–0.93)
GFR, Est African American: 102 mL/min/{1.73_m2} (ref >=60)
GFR, Est Non African American: 88 mL/min/{1.73_m2} (ref >=60)
Globulin: 2.9 g/dL (calc) (ref 1.9–3.7)
Glucose, Bld: 153 mg/dL — ABNORMAL HIGH (ref 65–99)
Potassium: 4.3 mmol/L (ref 3.5–5.3)
Sodium: 142 mmol/L (ref 135–146)
Total Bilirubin: 0.4 mg/dL (ref 0.2–1.2)
Total Protein: 7 g/dL (ref 6.1–8.1)

## 2017-12-11 LAB — HEMOGLOBIN A1C
EAG (MMOL/L): 9.8 (calc)
Hgb A1c MFr Bld: 7.8 % of total Hgb — ABNORMAL HIGH (ref <5.7)
Mean Plasma Glucose: 177 (calc)

## 2017-12-11 LAB — LIPID PANEL
Cholesterol: 160 mg/dL (ref <200)
HDL: 45 mg/dL — ABNORMAL LOW (ref >50)
LDL Cholesterol (Calc): 80 mg/dL (calc)
Non-HDL Cholesterol (Calc): 115 mg/dL (calc) (ref <130)
Total CHOL/HDL Ratio: 3.6 (calc) (ref <5.0)
Triglycerides: 263 mg/dL — ABNORMAL HIGH (ref <150)

## 2017-12-11 LAB — TSH: TSH: 3.63 mIU/L (ref 0.40–4.50)

## 2017-12-12 ENCOUNTER — Other Ambulatory Visit: Payer: Self-pay | Admitting: Family Medicine

## 2017-12-12 MED ORDER — METFORMIN HCL 1000 MG PO TABS: 1000.0000 mg | ORAL_TABLET | Freq: Two times a day (BID) | ORAL | 3 refills | Status: DC

## 2017-12-17 ENCOUNTER — Telehealth: Payer: Self-pay | Admitting: Family Medicine

## 2017-12-17 NOTE — Telephone Encounter (Signed)
Patient is calling to say that metformin 1000 mg makes her have runny stools, would like to know if she can take the 500 mg instead  [phone]

## 2017-12-18 NOTE — Telephone Encounter (Signed)
Start at 500 mg of glucophage xr in the am.  Increase to 1000 mg in 2 weeks if no diarrhea.

## 2017-12-19 NOTE — Telephone Encounter (Signed)
LMTRC

## 2017-12-20 NOTE — Telephone Encounter (Signed)
Called and spoke to pt and she is taking 500mg  po qam with no diarrhea. I tried to get her to do the 2 weeks and increase then tried to get her to switch to the XR and she states that she will just stay on the 500mg  qam as she can not have diarrhea all day and work and she does not want to try to increase it right now.

## 2017-12-24 ENCOUNTER — Other Ambulatory Visit: Payer: Self-pay | Admitting: Cardiology

## 2018-01-08 ENCOUNTER — Other Ambulatory Visit: Payer: Self-pay | Admitting: Family Medicine

## 2018-01-15 ENCOUNTER — Other Ambulatory Visit: Payer: Self-pay | Admitting: Cardiology

## 2018-01-21 DIAGNOSIS — E119 Type 2 diabetes mellitus without complications: Secondary | ICD-10-CM | POA: Diagnosis not present

## 2018-01-21 DIAGNOSIS — E109 Type 1 diabetes mellitus without complications: Secondary | ICD-10-CM | POA: Diagnosis not present

## 2018-01-21 DIAGNOSIS — Z794 Long term (current) use of insulin: Secondary | ICD-10-CM | POA: Diagnosis not present

## 2018-01-24 DIAGNOSIS — H25813 Combined forms of age-related cataract, bilateral: Secondary | ICD-10-CM | POA: Diagnosis not present

## 2018-01-25 ENCOUNTER — Encounter: Payer: Self-pay | Admitting: Cardiology

## 2018-01-25 ENCOUNTER — Ambulatory Visit: Payer: Medicare Other | Admitting: Cardiology

## 2018-01-25 ENCOUNTER — Encounter (INDEPENDENT_AMBULATORY_CARE_PROVIDER_SITE_OTHER): Payer: Self-pay

## 2018-01-25 VITALS — BP 138/80 | HR 65 | Ht 61.5 in | Wt 182.0 lb

## 2018-01-25 DIAGNOSIS — I25119 Atherosclerotic heart disease of native coronary artery with unspecified angina pectoris: Secondary | ICD-10-CM

## 2018-01-25 DIAGNOSIS — E1169 Type 2 diabetes mellitus with other specified complication: Secondary | ICD-10-CM

## 2018-01-25 DIAGNOSIS — E785 Hyperlipidemia, unspecified: Secondary | ICD-10-CM

## 2018-01-25 DIAGNOSIS — G4733 Obstructive sleep apnea (adult) (pediatric): Secondary | ICD-10-CM

## 2018-01-25 DIAGNOSIS — I10 Essential (primary) hypertension: Secondary | ICD-10-CM | POA: Diagnosis not present

## 2018-01-25 DIAGNOSIS — E669 Obesity, unspecified: Secondary | ICD-10-CM

## 2018-01-25 MED ORDER — ROSUVASTATIN CALCIUM 40 MG PO TABS: 40.0000 mg | ORAL_TABLET | Freq: Every day | ORAL | 3 refills | Status: DC

## 2018-01-25 MED ORDER — ISOSORBIDE MONONITRATE ER 60 MG PO TB24: 60.0000 mg | ORAL_TABLET | Freq: Every day | ORAL | 3 refills | Status: DC

## 2018-01-25 NOTE — Progress Notes (Signed)
PCP: Susy Frizzle, MD  Clinic Note: Chief Complaint  Patient presents with  . Follow-up    12 months.  . Coronary Artery Disease    does have some exertional jaw paoin    HPI: April [name] is a 77 y.o. female with a PMH notable for CAD with single-vessel disease unfavorable for PCI being treated medically.  She now presents for delayed annual follow-up.  May 2015 - progressive/crescendo Angina: Cath: Severe single site CAD involving very unfavorable bifurcation of the very tortuous L Circumflex) --> Recommendation was Med Rx.   She also has chronic dyspnea from COPD   OSA on BiPAP.  Recent Hospitalizations: none  Studies Personally Reviewed - (if available, images/films reviewed: From Epic Chart or Care Everywhere)  non  Interval History: She returns here today for delayed annual follow-up.  She has been doing okay but does note that if she goes a little too fast or pushes up a little bit too hard she will notice some jaw discomfort which is her anginal equivalent.  If she stops and rests it goes away after a few minutes and then she is able to go back to regular activity.  With this she gets a little bit short of breath, and that usually this time if she pushes beyond that she will get the jaw pain.  She is not having any resting jaw pain or any jaw pain with routine activity. She denies any PND orthopnea or edema, but does use BiPAP at night.  She cannot lie down flat because of her OSA.  She feels like she is "strangling". She may get a little bit dizzy bending over and sitting up fast, but otherwise no syncope/near syncope or TIA/amaurosis fugax.  No claudication.  ROS: A comprehensive was performed. Review of Systems  Constitutional: Negative for malaise/fatigue.  HENT: Negative for nosebleeds and sinus pain.   Respiratory: Positive for shortness of breath.   Cardiovascular: Positive for leg swelling (End of day, went on her feet all day).  Gastrointestinal:  Negative for abdominal pain, blood in stool, constipation, heartburn and melena.  Genitourinary: Negative for dysuria, frequency and hematuria.  Musculoskeletal: Positive for joint pain (Mild arthritis pains.).  Neurological: Positive for dizziness (With bending over and standing up). Negative for tingling, focal weakness, weakness and headaches.  Psychiatric/Behavioral: Negative.   All other systems reviewed and are negative.   Respiratory: Positive for cough, shortness of breath (Baseline fr please teach outside bisoprolol om COPD) and wheezing. Negative for sputum production.  COPD  Cardiovascular: Positive for orthopnea (Without BiPAP).  Gastrointestinal: Negative for abdominal pain, blood in stool and heartburn.  Genitourinary: Negative for hematuria.  Musculoskeletal: Positive for falls (She had a fall back in the early part of the summer and broke her right ankle. - > This was when she was at the beach, she fell getting out of the shower, slipped) and joint pain (Osteoarthritis).   I have reviewed and (if needed) personally updated the patient's problem list, medications, allergies, past medical and surgical history, social and family history.   Past Medical History:  Diagnosis Date  . Abnormal weight gain   . Arthritis    "joints" (06/13/2013)  . COPD (chronic obstructive pulmonary disease) ([place])   . Coronary artery disease, non-occlusive 06/2013   Cardiac Cath: sharp Angle take-off of Large Dominant Cx: ~70-80% mid Cx bifurcation lesion (Lateral OM &  AVGCx-PL-PDA both with hairpin ostial & ~50% lesions) - Not PCI amenable due to vessel tortuosity; ~  40-50% mid LAD;Ramus - no significnat diseaes; small non-dominant RCA  . Diverticulosis   . GERD (gastroesophageal reflux disease)   . History of stress test    a. 09/2006 nl Dobutamine Echo  . Hyperlipidemia   . Hypertension   . NIDDM (non-insulin dependent diabetes mellitus)   . On home oxygen therapy    "2L q hs; runs into my  BIPAP" (06/13/2013)  . OSA (obstructive sleep apnea)    "BIPAP w/O2" (06/13/2013)  . Tracheobronchomalacia    a. followed by [facility].    Past Surgical History:  Procedure Laterality Date  . CARDIAC CATHETERIZATION  06/2013   Cardiac Cath: sharp Angle take-off of Large Dominant Cx: ~70-80% mid Cx bifurcation lesion (Lateral OM &  AVGCx-PL-PDA both with hairpin ostial & ~50% lesions) - Not PCI amenable due to vessel tortuosity; ~40-50% mid LAD;Ramus - no significnat diseaes; small non-dominant RCA  . LEFT HEART CATHETERIZATION WITH CORONARY ANGIOGRAM N/A 06/16/2013   Procedure: LEFT HEART CATHETERIZATION WITH CORONARY ANGIOGRAM;  Surgeon: [name], MD;  Location: [facility] CATH [facility];  Service: Cardiovascular;  Laterality: N/A;  . TRANSTHORACIC ECHOCARDIOGRAM  06/17/2013   Normal LV size and function. EF 55-60% with no regional WMA. Grade 1 diastolic dysfunction. No significant valvular lesions  . TUBAL LIGATION      Current Meds  Medication Sig  . aspirin 81 MG tablet Take 81 mg by mouth daily.    . Calcium Carbonate-Vitamin D (CALCIUM 600 + D PO) Take 1 tablet by mouth daily.    Marland Kitchen gabapentin (NEURONTIN) 600 MG tablet TAKE 1 TABLET BY MOUTH 4 TIMES DAILY  . hydrochlorothiazide (MICROZIDE) 12.5 MG capsule TAKE 1 CAPSULE BY MOUTH ONCE DAILY  . LANTUS SOLOSTAR 100 UNIT/ML Solostar Pen INJECT 40 UNITS UNDER THE SKIN ONCE EVERY NIGHT AT BEDTIME  . metFORMIN (GLUCOPHAGE) 1000 MG tablet Take 1 tablet (1,000 mg total) by mouth 2 (two) times daily with a meal. (Patient taking differently: Take 500 mg by mouth daily with breakfast. )  . metoprolol tartrate (LOPRESSOR) 25 MG tablet Take 1 tablet (25 mg total) by mouth 2 (two) times daily. KEEP OV.  . Multiple Vitamin (MULTIVITAMIN) capsule Take 1 capsule by mouth daily.    . nitroGLYCERIN (NITROSTAT) 0.4 MG SL tablet Place 1 tablet (0.4 mg total) under the tongue every 5 (five) minutes x 3 doses as needed for chest pain.  . Omega-3 Fatty Acids (FISH  OIL) 1000 MG CAPS Take 1 capsule by mouth daily.    Marland Kitchen omeprazole (PRILOSEC) 20 MG capsule Take 20 mg by mouth daily.  . [DISCONTINUED] isosorbide mononitrate (IMDUR) 30 MG 24 hr tablet TAKE 1 TABLET BY MOUTH ONCE A DAY  . [DISCONTINUED] simvastatin (ZOCOR) 40 MG tablet Take 1 tablet (40 mg total) by mouth at bedtime.    Allergies  Allergen Reactions  . Neomycin Other (See Comments)    Visual disturbance and swelling in eyes    Social History   Tobacco Use  . Smoking status: Former Smoker    Packs/day: 1.50    Years: 20.00    Pack years: 30.00    Types: Cigarettes    Last attempt to quit: 02/13/1986    Years since quitting: 31.9  . Smokeless tobacco: Never Used  Substance Use Topics  . Alcohol use: No  . Drug use: No   Social History   Social History Narrative   Lives in [place] with her husband. 3 children.  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   Former 23  Pk/yr Smoker (1.5 PPD x 20 yr) - quit in 1998.    family history includes Asthma in her maternal grandmother; CAD in her brother and brother; Clotting disorder in her mother; Diabetes in her brother and paternal aunt; Heart disease in her maternal grandfather, maternal grandmother, and mother.  Wt Readings from Last 3 Encounters:  01/25/18 182 lb (82.6 kg)  12/10/17 179 lb (81.2 kg)  06/07/17 177 lb (80.3 kg)    PHYSICAL EXAM BP 138/80 (BP Location: Left Arm, Patient Position: Sitting, Cuff Size: Normal)   Pulse 65   Ht 5' 1.5" (1.562 m)   Wt 182 lb (82.6 kg)   LMP  (LMP Unknown)   BMI 33.83 kg/m  Physical Exam  Constitutional: She is oriented to person, place, and time. She appears well-developed and well-nourished. No distress.  Mildly obese but otherwise healthy-appearing.  Well-groomed  HENT:  Head: Normocephalic and atraumatic.  Neck: Normal range of motion. Neck supple. No hepatojugular reflux and no JVD present. Carotid bruit is not present.  Cardiovascular: Normal rate, regular rhythm, normal heart sounds,  intact distal pulses and normal pulses.  No extrasystoles are present. PMI is not displaced. Exam reveals no gallop and no friction rub.  No murmur heard. Pulmonary/Chest: Effort normal and breath sounds normal. No respiratory distress. She has no wheezes. She has no rales.  Prolonged expiratory phase with mild diffuse interstitial sounds but no rales or rhonchi.  Abdominal: Soft. Bowel sounds are normal. She exhibits no distension. There is no abdominal tenderness.  Musculoskeletal: Normal range of motion.        General: No edema.  Neurological: She is alert and oriented to person, place, and time.  Psychiatric: She has a normal mood and affect. Her behavior is normal. Judgment and thought content normal.  Vitals reviewed.   Adult ECG Report  Rate: 65 ;  Rhythm: normal sinus rhythm and Normal axis, intervals and durations.;   Narrative Interpretation: Normal EKG  Other studies Reviewed: Recent Labs:   Lab Results  Component Value Date   CREATININE 0.63 12/10/2017   BUN 14 12/10/2017   NA 142 12/10/2017   K 4.3 12/10/2017   CL 99 12/10/2017   CO2 33 (H) 12/10/2017   Lab Results  Component Value Date   CHOL 160 12/10/2017   HDL 45 (L) 12/10/2017   LDLCALC 80 12/10/2017   TRIG 263 (H) 12/10/2017   CHOLHDL 3.6 12/10/2017    ASSESSMENT / PLAN: Problem List Items Addressed This Visit    Atherosclerotic heart disease of native coronary artery with angina pectoris ([place]) - Primary (Chronic)    Her anginal equivalent was jaw pain and she is now noticing some exertional jaw pain.  May be angina class I if not to, but still relatively controlled.  Only exertional.    Plan:  Increase Imdur to 60 mg for additional antianginal benefit.    Leery of titrating up her beta-blocker dose any further because of resting heart rate in the 60s with some mild fatigue and her COPD.  Continue aspirin and statin (but convert to more potent statin).  If necessary, would consider adding amlodipine  for additional antianginal effect versus Ranexa.  (Would be dependent on blood pressure)      Relevant Medications   isosorbide mononitrate (IMDUR) 60 MG 24 hr tablet   rosuvastatin (CRESTOR) 40 MG tablet   Other Relevant Orders   EKG 12-Lead   Essential hypertension (Chronic)    Blood pressure looks pretty well controlled on  current meds.  She may very well have room for adding amlodipine, but for now we will increase Imdur and continue current meds.      Relevant Medications   isosorbide mononitrate (IMDUR) 60 MG 24 hr tablet   rosuvastatin (CRESTOR) 40 MG tablet   Other Relevant Orders   EKG 12-Lead   Hyperlipidemia associated with type 2 diabetes mellitus (HCC) (Chronic)    Most recent LDL was 80.  With her existing CAD, target should be less than 70 and if not less than 50.  Is only on simvastatin which would also cause complication if he wanted to add amlodipine.  Plan: Convert to Crestor 40 mg daily.  Apparently she had had issues with Lipitor in the past.  If not checked by PCP by 42-month follow-up, we should order labs at that time.      Relevant Medications   rosuvastatin (CRESTOR) 40 MG tablet   Obesity (BMI 30-39.9) (Chronic)   OSA treated with BiPAP (Chronic)    Requires BiPAP for sleeping.  Has not been on sleep without it.  Doing well now.        With mild worsening of her exertional "anginal symptoms "of jaw pain, and increasing Imdur, but would like for her to be seen sooner than 1 year follow-up.  She will follow-up with an APP in roughly 4 months. --Would like to do as best I can to control her symptoms medically as she still is not a good PCI target from a safety standpoint.  Would only consider relook cath if symptoms become unstable.   I spent a total of 25 minutes with the patient and chart review. >  50% of the time was spent in direct patient consultation.   Current medicines are reviewed at length with the patient today.  (+/- concerns) none The  following changes have been made:  see below - convert to Crestor & increase Imdur  Patient Instructions  Medication Instructions:  ---INCREASE IMDUR ( ISOSORBIDE MONO) TO 60 MG DAILY. --- STOP SIMVASTATIN --START TAKING ROSUVASTATIN ( CRESTOR) 40 MG  ONE TABLET  - If you need a refill on your cardiac medications before your next appointment, please call your pharmacy.   Lab work: NOT NEEDED If you have labs (blood work) drawn today and your tests are completely normal, you will receive your results only by: Marland Kitchen MyChart Message (if you have MyChart) OR . A paper copy in the mail If you have any lab test that is abnormal or we need to change your treatment, we will call you to review the results.  Testing/Procedures: NOT NEEDED  Follow-Up: At [facility], you and your health needs are our priority.  As part of our continuing mission to provide you with exceptional heart care, we have created designated Provider Care Teams.  These Care Teams include your primary Cardiologist (physician) and Advanced Practice Providers (APPs -  Physician Assistants and Nurse Practitioners) who all work together to provide you with the care you need, when you need it. You will need a follow up appointment in 12 months-DEC 2020.  Please call our office 2 months in advance to schedule this appointment.  You may see Glenetta Hew, MD or one of the following Advanced Practice Providers on your designated Care Team:   Rosaria Ferries, PA-C . Jory Sims, DNP, ANP-- Your physician recommends that you schedule a follow-up appointment in 4 MONTHS .   Any Other Special Instructions Will Be Listed Below (If Applicable).  Studies Ordered:   Orders Placed This Encounter  Procedures  . EKG 12-Lead      Glenetta Hew, M.D., M.S. Interventional Cardiologist   Pager # [phone] Phone # [phone] [address] [place], [place] [address]   Thank you for choosing Heartcare at  [facility]!!

## 2018-01-25 NOTE — Patient Instructions (Addendum)
Medication Instructions:  ---INCREASE IMDUR ( ISOSORBIDE MONO) TO 60 MG DAILY. --- STOP SIMVASTATIN --START TAKING ROSUVASTATIN ( CRESTOR) 40 MG  ONE TABLET  - If you need a refill on your cardiac medications before your next appointment, please call your pharmacy.   Lab work: NOT NEEDED If you have labs (blood work) drawn today and your tests are completely normal, you will receive your results only by: Marland Kitchen MyChart Message (if you have MyChart) OR . A paper copy in the mail If you have any lab test that is abnormal or we need to change your treatment, we will call you to review the results.  Testing/Procedures: NOT NEEDED  Follow-Up: At [facility], you and your health needs are our priority.  As part of our continuing mission to provide you with exceptional heart care, we have created designated Provider Care Teams.  These Care Teams include your primary Cardiologist (physician) and Advanced Practice Providers (APPs -  Physician Assistants and Nurse Practitioners) who all work together to provide you with the care you need, when you need it. You will need a follow up appointment in 12 months-DEC 2020.  Please call our office 2 months in advance to schedule this appointment.  You may see Glenetta Hew, MD or one of the following Advanced Practice Providers on your designated Care Team:   Rosaria Ferries, PA-C . Jory Sims, DNP, ANP-- Your physician recommends that you schedule a follow-up appointment in 4 MONTHS .   Any Other Special Instructions Will Be Listed Below (If Applicable).

## 2018-01-27 ENCOUNTER — Encounter: Payer: Self-pay | Admitting: Cardiology

## 2018-01-27 NOTE — Assessment & Plan Note (Signed)
Blood pressure looks pretty well controlled on current meds.  She may very well have room for adding amlodipine, but for now we will increase Imdur and continue current meds.

## 2018-01-27 NOTE — Assessment & Plan Note (Addendum)
Her anginal equivalent was jaw pain and she is now noticing some exertional jaw pain.  May be angina class I if not to, but still relatively controlled.  Only exertional.    Plan:  Increase Imdur to 60 mg for additional antianginal benefit.    Leery of titrating up her beta-blocker dose any further because of resting heart rate in the 60s with some mild fatigue and her COPD.  Continue aspirin and statin (but convert to more potent statin).  If necessary, would consider adding amlodipine for additional antianginal effect versus Ranexa.  (Would be dependent on blood pressure)

## 2018-01-27 NOTE — Assessment & Plan Note (Signed)
Requires BiPAP for sleeping.  Has not been on sleep without it.  Doing well now.

## 2018-01-27 NOTE — Assessment & Plan Note (Signed)
Most recent LDL was 80.  With her existing CAD, target should be less than 70 and if not less than 50.  Is only on simvastatin which would also cause complication if he wanted to add amlodipine.  Plan: Convert to Crestor 40 mg daily.  Apparently she had had issues with Lipitor in the past.  If not checked by PCP by 31-month follow-up, we should order labs at that time.

## 2018-02-11 DIAGNOSIS — H2511 Age-related nuclear cataract, right eye: Secondary | ICD-10-CM | POA: Diagnosis not present

## 2018-03-18 ENCOUNTER — Other Ambulatory Visit: Payer: Self-pay | Admitting: Cardiology

## 2018-03-18 ENCOUNTER — Other Ambulatory Visit: Payer: Self-pay | Admitting: Family Medicine

## 2018-03-18 DIAGNOSIS — H2512 Age-related nuclear cataract, left eye: Secondary | ICD-10-CM | POA: Diagnosis not present

## 2018-03-18 NOTE — Telephone Encounter (Signed)
Rx request sent to pharmacy.  

## 2018-03-25 DIAGNOSIS — H2512 Age-related nuclear cataract, left eye: Secondary | ICD-10-CM | POA: Diagnosis not present

## 2018-04-26 DIAGNOSIS — M545 Low back pain: Secondary | ICD-10-CM | POA: Diagnosis not present

## 2018-05-08 ENCOUNTER — Other Ambulatory Visit: Payer: Self-pay | Admitting: Family Medicine

## 2018-05-09 DIAGNOSIS — M545 Low back pain: Secondary | ICD-10-CM | POA: Diagnosis not present

## 2018-05-16 DIAGNOSIS — E119 Type 2 diabetes mellitus without complications: Secondary | ICD-10-CM | POA: Diagnosis not present

## 2018-05-16 DIAGNOSIS — E109 Type 1 diabetes mellitus without complications: Secondary | ICD-10-CM | POA: Diagnosis not present

## 2018-05-16 DIAGNOSIS — Z794 Long term (current) use of insulin: Secondary | ICD-10-CM | POA: Diagnosis not present

## 2018-05-20 ENCOUNTER — Telehealth: Payer: Self-pay

## 2018-05-20 NOTE — Telephone Encounter (Signed)
CALLED pt she states that she would like to do a telephone visit, she has no BP cuss or scale or email

## 2018-05-20 NOTE — Telephone Encounter (Signed)
We can reschedule unless she is symptomatic or has questions. Would encourage her to get a scale and a BP machine as soon as she can within her financial means, of course.

## 2018-05-21 NOTE — Telephone Encounter (Signed)
S/w pt she states that she is fine with cancelling her upcoming appt since she states that she has nothing to discuss anyway!!

## 2018-05-27 ENCOUNTER — Ambulatory Visit: Payer: Medicare Other | Admitting: Adult Health

## 2018-06-06 DIAGNOSIS — M545 Low back pain: Secondary | ICD-10-CM | POA: Diagnosis not present

## 2018-06-18 ENCOUNTER — Other Ambulatory Visit: Payer: Self-pay | Admitting: Cardiology

## 2018-06-28 ENCOUNTER — Other Ambulatory Visit: Payer: Self-pay | Admitting: Family Medicine

## 2018-06-28 MED ORDER — INSULIN GLARGINE 100 UNIT/ML SOLOSTAR PEN: PEN_INJECTOR | SUBCUTANEOUS | 3 refills | Status: DC

## 2018-07-01 DIAGNOSIS — M48061 Spinal stenosis, lumbar region without neurogenic claudication: Secondary | ICD-10-CM | POA: Diagnosis not present

## 2018-07-02 ENCOUNTER — Other Ambulatory Visit: Payer: Self-pay | Admitting: Surgical

## 2018-07-02 DIAGNOSIS — M48061 Spinal stenosis, lumbar region without neurogenic claudication: Secondary | ICD-10-CM

## 2018-07-05 ENCOUNTER — Telehealth: Payer: Self-pay | Admitting: Nurse Practitioner

## 2018-07-05 NOTE — Telephone Encounter (Signed)
Phone call to patient to verify medication list and allergies for myelogram procedure. Pt aware she will not need to hold any medications for this procedure. Pre and post procedure instructions reviewed with pt. Pt verbalized understanding. 

## 2018-07-11 DIAGNOSIS — Z961 Presence of intraocular lens: Secondary | ICD-10-CM | POA: Diagnosis not present

## 2018-07-23 DIAGNOSIS — G4733 Obstructive sleep apnea (adult) (pediatric): Secondary | ICD-10-CM | POA: Diagnosis not present

## 2018-08-06 ENCOUNTER — Other Ambulatory Visit: Payer: Self-pay

## 2018-08-06 ENCOUNTER — Ambulatory Visit
Admission: RE | Admit: 2018-08-06 | Discharge: 2018-08-06 | Disposition: A | Discharge status: Home / Self Care | Payer: Medicare Other | Source: Ambulatory Visit | Attending: Surgical | Admitting: Surgical

## 2018-08-06 DIAGNOSIS — M545 Low back pain: Secondary | ICD-10-CM | POA: Diagnosis not present

## 2018-08-06 DIAGNOSIS — M48061 Spinal stenosis, lumbar region without neurogenic claudication: Secondary | ICD-10-CM | POA: Diagnosis not present

## 2018-08-06 MED ORDER — DIAZEPAM 5 MG PO TABS: 5.0000 mg | ORAL_TABLET | Freq: Once | ORAL | Status: DC

## 2018-08-06 MED ORDER — IOPAMIDOL (ISOVUE-M 200) INJECTION 41%
15.0000 mL | Freq: Once | INTRAMUSCULAR | Status: AC
  Administered 2018-08-06: 15 mL via INTRATHECAL

## 2018-08-06 NOTE — Discharge Instructions (Signed)

## 2018-08-13 DIAGNOSIS — M48061 Spinal stenosis, lumbar region without neurogenic claudication: Secondary | ICD-10-CM | POA: Diagnosis not present

## 2018-08-20 ENCOUNTER — Ambulatory Visit (INDEPENDENT_AMBULATORY_CARE_PROVIDER_SITE_OTHER): Payer: Medicare Other | Admitting: Family Medicine

## 2018-08-20 ENCOUNTER — Telehealth: Payer: Self-pay | Admitting: Family Medicine

## 2018-08-20 ENCOUNTER — Other Ambulatory Visit: Payer: Self-pay

## 2018-08-20 VITALS — BP 136/64 | HR 66 | Temp 98.5°F | Resp 18 | Ht 62.0 in | Wt 183.0 lb

## 2018-08-20 DIAGNOSIS — M7989 Other specified soft tissue disorders: Secondary | ICD-10-CM | POA: Diagnosis not present

## 2018-08-20 MED ORDER — METFORMIN HCL 500 MG PO TABS: 500.0000 mg | ORAL_TABLET | Freq: Two times a day (BID) | ORAL | 3 refills | Status: DC

## 2018-08-20 MED ORDER — FUROSEMIDE 40 MG PO TABS: 40.0000 mg | ORAL_TABLET | Freq: Every day | ORAL | 0 refills | Status: DC

## 2018-08-20 NOTE — Progress Notes (Signed)
Subjective:    Patient ID: April [name], female    DOB: 1940/12/04, 78 y.o.   MRN: [id number]  HPI Patient is worked in today complaining of leg swelling and trouble breathing.  She states that over the last week she has noticed edema in her legs from her ankles to her mid shins.  Today she has +1 edema in both legs symmetrically.  She also reports some mildly increasing dyspnea on exertion. Wt Readings from Last 3 Encounters:  08/20/18 183 lb (83 kg)  01/25/18 182 lb (82.6 kg)  12/10/17 179 lb (81.2 kg)   Her weight has not changed substantially over the last several months.  She denies any chest pain or angina.  She denies any cough, pleurisy, hemoptysis, or fever.  She denies any palpitations or tachycardia.  She is on gabapentin however she has been on this for quite some time and she has not had any recent changes in her medication that would explain the edema.  She denies eating a lot of sodium recently Past Medical History:  Diagnosis Date  . Abnormal weight gain   . Arthritis    "joints" (06/13/2013)  . COPD (chronic obstructive pulmonary disease) ([place])   . Coronary artery disease, non-occlusive 06/2013   Cardiac Cath: sharp Angle take-off of Large Dominant Cx: ~70-80% mid Cx bifurcation lesion (Lateral OM &  AVGCx-PL-PDA both with hairpin ostial & ~50% lesions) - Not PCI amenable due to vessel tortuosity; ~40-50% mid LAD;Ramus - no significnat diseaes; small non-dominant RCA  . Diverticulosis   . GERD (gastroesophageal reflux disease)   . History of stress test    a. 09/2006 nl Dobutamine Echo  . Hyperlipidemia   . Hypertension   . NIDDM (non-insulin dependent diabetes mellitus)   . On home oxygen therapy    "2L q hs; runs into my BIPAP" (06/13/2013)  . OSA (obstructive sleep apnea)    "BIPAP w/O2" (06/13/2013)  . Tracheobronchomalacia    a. followed by [facility].   Past Surgical History:  Procedure Laterality Date  . CARDIAC CATHETERIZATION  06/2013   Cardiac Cath: sharp  Angle take-off of Large Dominant Cx: ~70-80% mid Cx bifurcation lesion (Lateral OM &  AVGCx-PL-PDA both with hairpin ostial & ~50% lesions) - Not PCI amenable due to vessel tortuosity; ~40-50% mid LAD;Ramus - no significnat diseaes; small non-dominant RCA  . LEFT HEART CATHETERIZATION WITH CORONARY ANGIOGRAM N/A 06/16/2013   Procedure: LEFT HEART CATHETERIZATION WITH CORONARY ANGIOGRAM;  Surgeon: [name], MD;  Location: [facility] CATH LAB;  Service: Cardiovascular;  Laterality: N/A;  . TRANSTHORACIC ECHOCARDIOGRAM  06/17/2013   Normal LV size and function. EF 55-60% with no regional WMA. Grade 1 diastolic dysfunction. No significant valvular lesions  . TUBAL LIGATION     Current Outpatient Medications on File Prior to Visit  Medication Sig Dispense Refill  . aspirin 81 MG tablet Take 81 mg by mouth daily.      . Calcium Carbonate-Vitamin D (CALCIUM 600 + D PO) Take 1 tablet by mouth daily.      Marland Kitchen gabapentin (NEURONTIN) 600 MG tablet TAKE 1 TABLET BY MOUTH 4 TIMES DAILY 360 tablet 1  . hydrochlorothiazide (MICROZIDE) 12.5 MG capsule TAKE 1 CAPSULE BY MOUTH ONCE DAILY 90 capsule 3  . Insulin Glargine (LANTUS SOLOSTAR) 100 UNIT/ML Solostar Pen INJECT 40-50 UNITS UNDER THE SKIN ONCE EVERY NIGHT AT BEDTIME (Patient taking differently: INJECT 40-50 UNITS UNDER THE SKIN ONCE EVERY NIGHT AT BEDTIME) 15 mL 3  . isosorbide  mononitrate (IMDUR) 60 MG 24 hr tablet Take 1 tablet (60 mg total) by mouth daily. 90 tablet 3  . metoprolol tartrate (LOPRESSOR) 25 MG tablet TAKE 1 TABLET BY MOUTH TWICE A DAY 180 tablet 1  . Multiple Vitamin (MULTIVITAMIN) capsule Take 1 capsule by mouth daily.      . nitroGLYCERIN (NITROSTAT) 0.4 MG SL tablet Place 1 tablet (0.4 mg total) under the tongue every 5 (five) minutes x 3 doses as needed for chest pain. 25 tablet 4  . Omega-3 Fatty Acids (FISH OIL) 1000 MG CAPS Take 1 capsule by mouth daily.      Marland Kitchen omeprazole (PRILOSEC) 20 MG capsule Take 20 mg by mouth daily.    .  rosuvastatin (CRESTOR) 40 MG tablet Take 1 tablet (40 mg total) by mouth daily. 90 tablet 3   No current facility-administered medications on file prior to visit.    Allergies  Allergen Reactions  . Neomycin Other (See Comments)    Visual disturbance and swelling in eyes   Social History   Socioeconomic History  . Marital status: Married    Spouse name: Not on file  . Number of children: 3  . Years of education: Not on file  . Highest education level: Not on file  Occupational History  . Occupation: Systems analyst  Social Needs  . Financial resource strain: Not on file  . Food insecurity    Worry: Not on file    Inability: Not on file  . Transportation needs    Medical: Not on file    Non-medical: Not on file  Tobacco Use  . Smoking status: Former Smoker    Packs/day: 1.50    Years: 20.00    Pack years: 30.00    Types: Cigarettes    Quit date: 02/13/1986    Years since quitting: 32.5  . Smokeless tobacco: Never Used  Substance and Sexual Activity  . Alcohol use: No  . Drug use: No  . Sexual activity: Yes  Lifestyle  . Physical activity    Days per week: Not on file    Minutes per session: Not on file  . Stress: Not on file  Relationships  . Social Herbalist on phone: Not on file    Gets together: Not on file    Attends religious service: Not on file    Active member of club or organization: Not on file    Attends meetings of clubs or organizations: Not on file    Relationship status: Not on file  . Intimate partner violence    Fear of current or ex partner: Not on file    Emotionally abused: Not on file    Physically abused: Not on file    Forced sexual activity: Not on file  Other Topics Concern  . Not on file  Social History Narrative   Lives in [place] with her husband. 3 children.  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   Former 30 Pk/yr Smoker (1.5 PPD x 20 yr) - quit in 1998.      Review of Systems  All other systems reviewed and are  negative.      Objective:   Physical Exam Constitutional:      Appearance: Normal appearance.  Cardiovascular:     Rate and Rhythm: Normal rate and regular rhythm.     Heart sounds: Murmur present.  Pulmonary:     Effort: Pulmonary effort is normal. No respiratory distress.     Breath sounds: Normal breath  sounds. No stridor. No wheezing, rhonchi or rales.  Musculoskeletal:     Right lower leg: Edema present.     Left lower leg: Edema present.  Neurological:     Mental Status: She is alert.           Assessment & Plan:  The encounter diagnosis was Leg swelling. There is no appreciable pulmonary edema auscultated on exam however the patient does have leg swelling.  I will discontinue hydrochlorothiazide and start the patient empirically on Lasix 40 mg a day.  I will check a BNP and a BMP.  I will recheck the patient via telephone on Friday to an update on her condition.  If her breathing is no better at that time I would obtain a chest x-ray and depending upon her lab work consider increasing the dose of diuretic.  May also need to consider repeating echocardiogram if BNP is elevated or if work up shows pulmonary edema as a cause of her shortness of breath

## 2018-08-20 NOTE — Telephone Encounter (Signed)
Patient called in stating that she is experiencing swelling to bilateral feet and legs. She stated that the swelling is non pitting. She denies any SOB or pain to lower extremities. She also stated that swelling goes down at bedtime but returns throughout the day. Please advise?

## 2018-08-20 NOTE — Telephone Encounter (Signed)
Spoke with patient and was able to get her scheduled. Patient verbalized understanding.

## 2018-08-20 NOTE — Telephone Encounter (Signed)
I would like to see the patient so that I can examine the swelling and listen to her lungs and heart

## 2018-08-21 LAB — BASIC METABOLIC PANEL WITH GFR
BUN: 23 mg/dL (ref 7–25)
CO2: 36 mmol/L — ABNORMAL HIGH (ref 20–32)
Calcium: 10 mg/dL (ref 8.6–10.4)
Chloride: 101 mmol/L (ref 98–110)
Creat: 0.81 mg/dL (ref 0.60–0.93)
GFR, Est African American: 82 mL/min/{1.73_m2} (ref >=60)
GFR, Est Non African American: 71 mL/min/{1.73_m2} (ref >=60)
Glucose, Bld: 183 mg/dL — ABNORMAL HIGH (ref 65–99)
Potassium: 5 mmol/L (ref 3.5–5.3)
Sodium: 141 mmol/L (ref 135–146)

## 2018-08-21 LAB — BRAIN NATRIURETIC PEPTIDE: Brain Natriuretic Peptide: 71 pg/mL (ref <100)

## 2018-09-12 ENCOUNTER — Other Ambulatory Visit: Payer: Self-pay | Admitting: Family Medicine

## 2018-09-16 DIAGNOSIS — Z794 Long term (current) use of insulin: Secondary | ICD-10-CM | POA: Diagnosis not present

## 2018-09-16 DIAGNOSIS — E109 Type 1 diabetes mellitus without complications: Secondary | ICD-10-CM | POA: Diagnosis not present

## 2018-09-16 DIAGNOSIS — E119 Type 2 diabetes mellitus without complications: Secondary | ICD-10-CM | POA: Diagnosis not present

## 2018-10-11 DIAGNOSIS — E119 Type 2 diabetes mellitus without complications: Secondary | ICD-10-CM | POA: Diagnosis not present

## 2018-10-11 DIAGNOSIS — R112 Nausea with vomiting, unspecified: Secondary | ICD-10-CM | POA: Diagnosis not present

## 2018-10-11 DIAGNOSIS — R197 Diarrhea, unspecified: Secondary | ICD-10-CM | POA: Diagnosis not present

## 2018-10-11 DIAGNOSIS — R509 Fever, unspecified: Secondary | ICD-10-CM | POA: Diagnosis not present

## 2018-11-11 ENCOUNTER — Other Ambulatory Visit: Payer: Self-pay

## 2018-11-11 ENCOUNTER — Ambulatory Visit (INDEPENDENT_AMBULATORY_CARE_PROVIDER_SITE_OTHER): Payer: Medicare Other

## 2018-11-11 DIAGNOSIS — Z23 Encounter for immunization: Secondary | ICD-10-CM

## 2018-11-11 NOTE — Progress Notes (Signed)
Patient came in today to receive her annual flu shot. Patient was given the fluarix injection in her right deltoid. Patient tolerated well. VIS given

## 2018-11-12 ENCOUNTER — Other Ambulatory Visit: Payer: Self-pay | Admitting: Family Medicine

## 2018-11-12 DIAGNOSIS — Z1231 Encounter for screening mammogram for malignant neoplasm of breast: Secondary | ICD-10-CM

## 2018-11-18 ENCOUNTER — Ambulatory Visit: Payer: Medicare Other | Admitting: Pulmonary Disease

## 2018-11-18 ENCOUNTER — Ambulatory Visit (INDEPENDENT_AMBULATORY_CARE_PROVIDER_SITE_OTHER): Payer: Medicare Other

## 2018-11-18 ENCOUNTER — Encounter: Payer: Self-pay | Admitting: Pulmonary Disease

## 2018-11-18 ENCOUNTER — Other Ambulatory Visit: Payer: Self-pay

## 2018-11-18 DIAGNOSIS — R0609 Other forms of dyspnea: Secondary | ICD-10-CM

## 2018-11-18 DIAGNOSIS — G4733 Obstructive sleep apnea (adult) (pediatric): Secondary | ICD-10-CM | POA: Diagnosis not present

## 2018-11-18 DIAGNOSIS — R0602 Shortness of breath: Secondary | ICD-10-CM | POA: Diagnosis not present

## 2018-11-18 DIAGNOSIS — R0600 Dyspnea, unspecified: Secondary | ICD-10-CM

## 2018-11-18 NOTE — Patient Instructions (Signed)
Chest x-ray today.  BiPAP is working well on current settings Supplies will be renewed for a year

## 2018-11-18 NOTE — Progress Notes (Signed)
   Subjective:    Patient ID: April [name], female    DOB: [birth year], 78 y.o.   MRN: [id number]  HPI  78 yo  -smoker with an extensive workup for dyspnea which was finally attributed to tracheobroncho- malacia. Improved with BiPAP. She also has large Hiatal hernia.   She obtained a new BiPAP machine in 2018 Has been doing well since then.  Continues to work 4 hours in the [employer]. Complains of mildly increased shortness of breath, no cough or wheezing. Has occasional GERD and remains in Prilosec. To the pedal edema and was given Lasix earlier in the year, also on low-dose thiazide  Continues to have orthopnea but without BiPAP she is able to lie flat.  No problems with mask or pressure.  Very compliant with BiPAP which is objectively checked on download more than 8 hours per night, this also showed good control of events and minimal leak  She is accompanied by her daughter-in-law April [name] today  Significant tests/ events reviewed  08/2012 sniff test negative  HRCT 03/2007 &05/2008 did not show any evidence of pulmonary fibrosis . Large hiatal hernia was noted.   PFT initially showed intraparenchymal restriction with a diffuse capacity of 49%. PFTs 09/2007 >>marked improvment, FEV1% was 78, FEV68 %, FVC 63%, TLC 67%, DLCO remains 51%.  PSG did reveal mild obstructive sleep apnea. On BiPAP +10/5 &is able to sleep supine.  Echo- no rt-->LT shunt   Feb, 2012 - Hoarseness resolved - lisinopril stopped, neg ENt evaluation April [name])  06/17/2010 -ONO on bipap/ RA desaturation for about 6 mins -   08/22/2012 spirometry >> FVC 75% , mild restriction - improved slightly   Review of Systems neg for any significant sore throat, dysphagia, itching, sneezing, nasal congestion or excess/ purulent secretions, fever, chills, sweats, unintended wt loss, pleuritic or exertional cp, hempoptysis, orthopnea pnd or change in chronic leg swelling. Also denies presyncope, palpitations, heartburn,  abdominal pain, nausea, vomiting, diarrhea or change in bowel or urinary habits, dysuria,hematuria, rash, arthralgias, visual complaints, headache, numbness weakness or ataxia.     Objective:   Physical Exam   Gen. Pleasant, obese, in no distress ENT - no lesions, no post nasal drip Neck: No JVD, no thyromegaly, no carotid bruits Lungs: no use of accessory muscles, no dullness to percussion, rt basal rales no rhonchi  Cardiovascular: Rhythm regular, heart sounds  normal, no murmurs or gallops, no peripheral edema Musculoskeletal: No deformities, no cyanosis or clubbing , no tremors        Assessment & Plan:

## 2018-11-18 NOTE — Assessment & Plan Note (Signed)
She is very compliant, BiPAP is certainly helped improve her overall symptoms and her sleep  Weight loss encouraged, compliance with goal of at least 4-6 hrs every night is the expectation. Advised against medications with sedative side effects

## 2018-11-18 NOTE — Assessment & Plan Note (Signed)
Chest x-ray today.  She has some crackles in the right base but prior CT imaging has been negative for fibrosis Sniff test has been negative for diaphragmatic paralysis.  Weight-based dosing of Lasix advised

## 2018-11-20 DIAGNOSIS — G4733 Obstructive sleep apnea (adult) (pediatric): Secondary | ICD-10-CM | POA: Diagnosis not present

## 2018-12-27 ENCOUNTER — Ambulatory Visit
Admission: RE | Admit: 2018-12-27 | Discharge: 2018-12-27 | Disposition: A | Discharge status: Home / Self Care | Payer: Medicare Other | Source: Ambulatory Visit | Attending: Family Medicine | Admitting: Family Medicine

## 2018-12-27 ENCOUNTER — Other Ambulatory Visit: Payer: Self-pay

## 2018-12-27 DIAGNOSIS — Z1231 Encounter for screening mammogram for malignant neoplasm of breast: Secondary | ICD-10-CM | POA: Diagnosis not present

## 2018-12-31 ENCOUNTER — Other Ambulatory Visit: Payer: Self-pay | Admitting: Family Medicine

## 2019-01-07 DIAGNOSIS — E119 Type 2 diabetes mellitus without complications: Secondary | ICD-10-CM | POA: Diagnosis not present

## 2019-01-07 DIAGNOSIS — E109 Type 1 diabetes mellitus without complications: Secondary | ICD-10-CM | POA: Diagnosis not present

## 2019-01-07 DIAGNOSIS — Z794 Long term (current) use of insulin: Secondary | ICD-10-CM | POA: Diagnosis not present

## 2019-01-10 ENCOUNTER — Other Ambulatory Visit: Payer: Self-pay | Admitting: Family Medicine

## 2019-01-10 ENCOUNTER — Other Ambulatory Visit: Payer: Self-pay | Admitting: Cardiology

## 2019-01-27 ENCOUNTER — Encounter: Payer: Self-pay | Admitting: Cardiology

## 2019-01-27 ENCOUNTER — Ambulatory Visit (INDEPENDENT_AMBULATORY_CARE_PROVIDER_SITE_OTHER): Payer: Medicare Other | Admitting: Cardiology

## 2019-01-27 ENCOUNTER — Encounter (INDEPENDENT_AMBULATORY_CARE_PROVIDER_SITE_OTHER): Payer: Self-pay

## 2019-01-27 ENCOUNTER — Other Ambulatory Visit: Payer: Self-pay

## 2019-01-27 VITALS — BP 127/71 | HR 91 | Temp 97.3°F | Ht 62.0 in | Wt 181.0 lb

## 2019-01-27 DIAGNOSIS — E1169 Type 2 diabetes mellitus with other specified complication: Secondary | ICD-10-CM | POA: Diagnosis not present

## 2019-01-27 DIAGNOSIS — E785 Hyperlipidemia, unspecified: Secondary | ICD-10-CM

## 2019-01-27 DIAGNOSIS — G4733 Obstructive sleep apnea (adult) (pediatric): Secondary | ICD-10-CM | POA: Diagnosis not present

## 2019-01-27 DIAGNOSIS — E669 Obesity, unspecified: Secondary | ICD-10-CM

## 2019-01-27 DIAGNOSIS — I25119 Atherosclerotic heart disease of native coronary artery with unspecified angina pectoris: Secondary | ICD-10-CM | POA: Diagnosis not present

## 2019-01-27 DIAGNOSIS — I10 Essential (primary) hypertension: Secondary | ICD-10-CM

## 2019-01-27 DIAGNOSIS — I200 Unstable angina: Secondary | ICD-10-CM

## 2019-01-27 MED ORDER — METOPROLOL TARTRATE 25 MG PO TABS: 25.0000 mg | ORAL_TABLET | Freq: Two times a day (BID) | ORAL | 3 refills | Status: DC

## 2019-01-27 NOTE — Progress Notes (Signed)
Primary Care Provider: Susy Frizzle, MD Cardiologist: Glenetta Hew, MD Electrophysiologist:   Clinic Note: Chief Complaint  Patient presents with  . Follow-up    Annual  . Coronary Artery Disease    Significant lesion in tortuous circumflex, not good PCI option.  Plan for medical management.    HPI:    April [name] is a 78 y.o. female with a PMH notable for single-vessel CAD-unfavorable PCI (medical management) who presents today for annual follow-up.   May 2015 - progressive/crescendo Angina: Cath: Severe single site CAD involving very unfavorable bifurcation of the very tortuous L Circumflex) --> Recommendation was Med Rx.   She also has chronic dyspnea from COPD   OSA on BiPAP.  April [name] was last seen on January 25, 2018--was doing pretty well unless she "pushed it too fast at which time she would maybe get a little bit more short winded and have some jaw discomfort ".  Symptoms always relieved with rest.  No real claudication.  No PND orthopnea or edema.  Uses BiPAP at night.  OSA minutes or lying flat.  Recent Hospitalizations: None that I can see  Reviewed  CV studies:    The following studies were reviewed today: (if available, images/films reviewed: From Epic Chart or Care Everywhere) . none:  Interval History:   April [name] returns today much stable.  She really does not have any complaints.  She says that she still has the same shortness of breath or exertional dyspnea that she had when I saw her last.  No chest pain or pressure with rest or exertion.  Her baseline swelling is about the same-she describes it as minimal and the day swelling..  She is not really describing the jaw discomfort with exertion this time around. She is a little more active than she had been, tries to get some walking in but not any routine.  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  She does happy that she is maintaining her job.  CV Review of Symptoms (Summary): positive for - dyspnea on exertion, edema and no real change from baseline negative for - chest pain, irregular heartbeat, orthopnea, palpitations, paroxysmal nocturnal dyspnea, rapid heart rate, shortness of breath or syncope/nearsyncoep; TIA/amaurosis fugax, claudication  The patient does not have symptoms concerning for COVID-19 infection (fever, chills, cough, or new shortness of breath).  The patient is practicing social distancing. ++ Masking.  occasionl Groceries/shopping. Still @ Work,but kids not yet back in. 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    REVIEWED OF SYSTEMS   A comprehensive ROS was performed. Review of Systems  Constitutional: Negative for malaise/fatigue and weight loss.  HENT: Negative for congestion and nosebleeds.   Respiratory: Positive for shortness of breath (Stable shortness of breath).   Cardiovascular: Positive for leg swelling (Stable.  Mild end of day-when she is on her feet all day).  Gastrointestinal: Negative for blood in stool and melena.  Genitourinary: Negative for hematuria.  Musculoskeletal: Positive for joint pain (Mild arthritis pains).  Neurological: Positive for dizziness (Mostly with bending over too fast and standing back up).  Psychiatric/Behavioral: Negative for memory loss. The patient is not nervous/anxious and does not have insomnia.     have reviewed and (if needed) personally updated the patient's problem list, medications, allergies, past medical and surgical history, social and family history.   PAST  MEDICAL HISTORY   Past Medical History:  Diagnosis Date  . Abnormal weight gain   . Arthritis    "joints" (06/13/2013)  . COPD (chronic obstructive pulmonary disease) ([place])   . Coronary artery disease, non-occlusive 06/2013   Cardiac Cath: sharp Angle take-off of Large Dominant Cx: ~70-80% mid Cx bifurcation lesion (Lateral OM &   AVGCx-PL-PDA both with hairpin ostial & ~50% lesions) - Not PCI amenable due to vessel tortuosity; ~40-50% mid LAD;Ramus - no significnat diseaes; small non-dominant RCA  . Diverticulosis   . GERD (gastroesophageal reflux disease)   . History of stress test    a. 09/2006 nl Dobutamine Echo  . Hyperlipidemia   . Hypertension   . NIDDM (non-insulin dependent diabetes mellitus)   . On home oxygen therapy    "2L q hs; runs into my BIPAP" (06/13/2013)  . OSA (obstructive sleep apnea)    "BIPAP w/O2" (06/13/2013)  . Tracheobronchomalacia    a. followed by [facility].     PAST SURGICAL HISTORY   Past Surgical History:  Procedure Laterality Date  . CARDIAC CATHETERIZATION  06/2013   Cardiac Cath: sharp Angle take-off of Large Dominant Cx: ~70-80% mid Cx bifurcation lesion (Lateral OM &  AVGCx-PL-PDA both with hairpin ostial & ~50% lesions) - Not PCI amenable due to vessel tortuosity; ~40-50% mid LAD;Ramus - no significnat diseaes; small non-dominant RCA  . LEFT HEART CATHETERIZATION WITH CORONARY ANGIOGRAM N/A 06/16/2013   Procedure: LEFT HEART CATHETERIZATION WITH CORONARY ANGIOGRAM;  Surgeon: [name], MD;  Location: [facility] CATH LAB;  Service: Cardiovascular;  Laterality: N/A;  . TRANSTHORACIC ECHOCARDIOGRAM  06/17/2013   Normal LV size and function. EF 55-60% with no regional WMA. Grade 1 diastolic dysfunction. No significant valvular lesions  . TUBAL LIGATION       MEDICATIONS/ALLERGIES   Current Meds  Medication Sig  . aspirin 81 MG tablet Take 81 mg by mouth daily.    . Calcium Carbonate-Vitamin D (CALCIUM 600 + D PO) Take 1 tablet by mouth daily.    . furosemide (LASIX) 40 MG tablet Take 1 tablet (40 mg total) by mouth daily.  Marland Kitchen gabapentin (NEURONTIN) 600 MG tablet TAKE 1 TABLET BY MOUTH 4 TIMES DAILY  . hydrochlorothiazide (MICROZIDE) 12.5 MG capsule TAKE 1 CAPSULE BY MOUTH ONCE DAILY  . Insulin Glargine (LANTUS SOLOSTAR) 100 UNIT/ML Solostar Pen INJECT 40-50 UNITS UNDER THE SKIN  ONCE EVERY NIGHT AT BEDTIME.  . isosorbide mononitrate (IMDUR) 60 MG 24 hr tablet TAKE 1 TABLET BY MOUTH ONCE DAILY  . metFORMIN (GLUCOPHAGE) 500 MG tablet Take 1 tablet (500 mg total) by mouth 2 (two) times daily with a meal.  . metoprolol tartrate (LOPRESSOR) 25 MG tablet Take 1 tablet (25 mg total) by mouth 2 (two) times daily.  . Multiple Vitamin (MULTIVITAMIN) capsule Take 1 capsule by mouth daily.    . nitroGLYCERIN (NITROSTAT) 0.4 MG SL tablet Place 1 tablet (0.4 mg total) under the tongue every 5 (five) minutes x 3 doses as needed for chest pain.  . Omega-3 Fatty Acids (FISH OIL) 1000 MG CAPS Take 1 capsule by mouth daily.    Marland Kitchen omeprazole (PRILOSEC) 20 MG capsule Take 20 mg by mouth daily.  . rosuvastatin (CRESTOR) 40 MG tablet TAKE 1 TABLET BY MOUTH ONCE DAILY  . [DISCONTINUED] metoprolol tartrate (LOPRESSOR) 25 MG tablet TAKE 1 TABLET BY MOUTH TWICE A DAY    Allergies  Allergen Reactions  . Neomycin Other (See Comments)    Visual disturbance and swelling  in eyes     SOCIAL HISTORY/FAMILY HISTORY   Social History   Tobacco Use  . Smoking status: Former Smoker    Packs/day: 1.50    Years: 20.00    Pack years: 30.00    Types: Cigarettes    Quit date: 02/13/1986    Years since quitting: 32.9  . Smokeless tobacco: Never Used  Substance Use Topics  . Alcohol use: No  . Drug use: No   Social History   Social History Narrative   Lives in [place] with her husband. 3 children.  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   Former 30 Pk/yr Smoker (1.5 PPD x 20 yr) - quit in 1998.    Family History family history includes Asthma in her maternal grandmother; CAD in her brother and brother; Clotting disorder in her mother; Diabetes in her brother and paternal aunt; Heart disease in her maternal grandfather, maternal grandmother, and mother.   OBJCTIVE -PE, EKG, labs   Wt Readings from Last 3 Encounters:  01/27/19 181 lb (82.1 kg)  11/18/18 179 lb 6.4 oz (81.4 kg)  08/20/18 183 lb (83  kg)    Physical Exam: BP 127/71   Pulse 91   Temp (!) 97.3 F (36.3 C)   Ht 5\' 2"  (1.575 m)   Wt 181 lb (82.1 kg)   LMP  (LMP Unknown)   SpO2 94%   BMI 33.11 kg/m  Physical Exam  Constitutional: She is oriented to person, place, and time. She appears well-developed and well-nourished. No distress.  Mildly obese woman who appears younger than stated age.  Well-groomed.  HENT:  Head: Normocephalic and atraumatic.  Eyes: EOM are normal.  Pulmonary/Chest: Effort normal. No respiratory distress. She exhibits no tenderness.  Mild faint basal interstitial sounds but no rales or rhonchi.  Nonlabored.  No wheeze  Abdominal: Soft. Bowel sounds are normal. She exhibits no distension. There is no abdominal tenderness. There is no rebound.  Musculoskeletal:        General: No edema (Trivial pedal/ankle edema). Normal range of motion.     Cervical back: Normal range of motion and neck supple.  Neurological: She is alert and oriented to person, place, and time.  Psychiatric: She has a normal mood and affect. Her behavior is normal. Judgment and thought content normal.     Adult ECG Report  Rate: 87 ;  Rhythm: normal sinus rhythm and Cannot exclude septal MI, age undetermined.; Normal axis, intervals and durations  Narrative Interpretation: Stable EKG  Recent Labs: Should be due to have labs checked by PCP soon. Lab Results  Component Value Date   CHOL 160 12/10/2017   HDL 45 (L) 12/10/2017   LDLCALC 80 12/10/2017   TRIG 263 (H) 12/10/2017   CHOLHDL 3.6 12/10/2017   Lab Results  Component Value Date   CREATININE 0.81 08/20/2018   BUN 23 08/20/2018   NA 141 08/20/2018   K 5.0 08/20/2018   CL 101 08/20/2018   CO2 36 (H) 08/20/2018    ASSESSMENT/PLAN    Problem List Items Addressed This Visit    Atherosclerotic heart disease of native coronary artery with angina pectoris ([place]) - Primary (Chronic)    Her anginal equivalent was jaw pain-mostly exertional.-Seems much better than  last year.  May be class I at the most.  Plan: Continue beta-blocker and Imdur. Continue aspirin along with statin.   Treatment after titration of beta-blocker and Imdur would be to consider adding amlodipine versus Ranexa if worsening symptoms occur.  Relevant Medications   metoprolol tartrate (LOPRESSOR) 25 MG tablet   Other Relevant Orders   EKG 12-Lead (Completed)   Hyperlipidemia associated with type 2 diabetes mellitus ([place]) (Chronic)    Her last LDL in 2019 was 80.  Not quite where he went to be.  We converted her to Crestor 40 mg last visit and I do not have current labs.  Hopefully her LDL will be less than 70 with a intent to drive down less than 55.  She is on fish oil in addition to her statin, may need to consider Zetia or more aggressive management depending on what follow-up labs look like.      Essential hypertension (Chronic)    Stable normal blood pressure on really minimal medications.  She is alternating the Lasix and HCTZ.      Relevant Medications   metoprolol tartrate (LOPRESSOR) 25 MG tablet   Other Relevant Orders   EKG 12-Lead (Completed)   Unstable angina - history of (Chronic)    Thankfully, no longer having any unstable angina symptoms.  May be at the most class I angina on current dose of Imdur and Lopressor.  She still has room to titrate the metoprolol up further if necessary, but monitor consideration would be to add a calcium channel blocker for some additional vasodilation and antiangina.      Relevant Medications   metoprolol tartrate (LOPRESSOR) 25 MG tablet   Obesity (BMI 30-39.9) (Chronic)    The patient understands the need to lose weight with diet and exercise. We have discussed specific strategies for this.        OSA treated with BiPAP (Chronic)    Very compliant with BiPAP.  She does not really sleep well without it.  With it she gets at least 4 but more often than not 6 hours of sleep unless she wakes up to go to the bathroom.   Does not like to use sedatives.      Relevant Orders   EKG 12-Lead (Completed)       COVID-19 Education: The signs and symptoms of COVID-19 were discussed with the patient and how to seek care for testing (follow up with PCP or arrange E-visit).   The importance of social distancing was discussed today.  I spent a total of 43minutes with the patient and chart review. >  50% of the time was spent in direct patient consultation.  Additional time spent with chart review (studies, outside notes, etc): 8 Total Time: 26 min   Current medicines are reviewed at length with the patient today.  (+/- concerns) none   Patient Instructions / Medication Changes & Studies & Tests Ordered   Patient Instructions  Medication Instructions:  No changes *If you need a refill on your cardiac medications before your next appointment, please call your pharmacy*  Lab Work: Not needed  Testing/Procedures: Not needed  Follow-Up: At [facility], you and your health needs are our priority.  As part of our continuing mission to provide you with exceptional heart care, we have created designated Provider Care Teams.  These Care Teams include your primary Cardiologist (physician) and Advanced Practice Providers (APPs -  Physician Assistants and Nurse Practitioners) who all work together to provide you with the care you need, when you need it.  Your next appointment:   12 month(s)  The format for your next appointment:   In Person  Provider:   Glenetta Hew, MD  Other Instructions     Studies Ordered:  Orders Placed This Encounter  Procedures  . EKG 12-Lead     Glenetta Hew, M.D., M.S. Interventional Cardiologist   Pager # [phone] Phone # [phone] [address]. [place], [place] [address]   Thank you for choosing Heartcare at [facility]!!

## 2019-01-27 NOTE — Patient Instructions (Signed)

## 2019-01-29 ENCOUNTER — Encounter: Payer: Self-pay | Admitting: Cardiology

## 2019-01-29 NOTE — Assessment & Plan Note (Signed)
The patient understands the need to lose weight with diet and exercise. We have discussed specific strategies for this.  

## 2019-01-29 NOTE — Assessment & Plan Note (Signed)
Stable normal blood pressure on really minimal medications.  She is alternating the Lasix and HCTZ.

## 2019-01-29 NOTE — Assessment & Plan Note (Signed)
Thankfully, no longer having any unstable angina symptoms.  May be at the most class I angina on current dose of Imdur and Lopressor.  She still has room to titrate the metoprolol up further if necessary, but monitor consideration would be to add a calcium channel blocker for some additional vasodilation and antiangina.

## 2019-01-29 NOTE — Assessment & Plan Note (Addendum)
Her anginal equivalent was jaw pain-mostly exertional.-Seems much better than last year.  May be class I at the most.  Plan: Continue beta-blocker and Imdur. Continue aspirin along with statin.   Treatment after titration of beta-blocker and Imdur would be to consider adding amlodipine versus Ranexa if worsening symptoms occur.

## 2019-01-29 NOTE — Assessment & Plan Note (Signed)
Very compliant with BiPAP.  She does not really sleep well without it.  With it she gets at least 4 but more often than not 6 hours of sleep unless she wakes up to go to the bathroom.  Does not like to use sedatives.

## 2019-01-29 NOTE — Assessment & Plan Note (Signed)
Her last LDL in 2019 was 80.  Not quite where he went to be.  We converted her to Crestor 40 mg last visit and I do not have current labs.  Hopefully her LDL will be less than 70 with a intent to drive down less than 55.  She is on fish oil in addition to her statin, may need to consider Zetia or more aggressive management depending on what follow-up labs look like.

## 2019-02-28 DIAGNOSIS — G4733 Obstructive sleep apnea (adult) (pediatric): Secondary | ICD-10-CM | POA: Diagnosis not present

## 2019-03-04 ENCOUNTER — Ambulatory Visit (INDEPENDENT_AMBULATORY_CARE_PROVIDER_SITE_OTHER): Payer: Medicare Other | Admitting: Family Medicine

## 2019-03-04 ENCOUNTER — Other Ambulatory Visit: Payer: Self-pay

## 2019-03-04 ENCOUNTER — Encounter: Payer: Self-pay | Admitting: Family Medicine

## 2019-03-04 VITALS — BP 120/74 | HR 70 | Temp 97.2°F | Resp 16 | Ht 62.0 in | Wt 179.0 lb

## 2019-03-04 DIAGNOSIS — I10 Essential (primary) hypertension: Secondary | ICD-10-CM

## 2019-03-04 DIAGNOSIS — E118 Type 2 diabetes mellitus with unspecified complications: Secondary | ICD-10-CM | POA: Diagnosis not present

## 2019-03-04 DIAGNOSIS — E038 Other specified hypothyroidism: Secondary | ICD-10-CM

## 2019-03-04 DIAGNOSIS — E039 Hypothyroidism, unspecified: Secondary | ICD-10-CM

## 2019-03-04 DIAGNOSIS — E7800 Pure hypercholesterolemia, unspecified: Secondary | ICD-10-CM

## 2019-03-04 NOTE — Progress Notes (Signed)
Subjective:    Patient ID: April [name], female    DOB: June 05, 1940, 79 y.o.   MRN: [id number]  HPI Patient is here today for a follow-up of her diabetes.  She is checking her blood sugar every morning and finds it run between 120 and 140.  She is taking Metformin.  She is also taking about 40 units a day of Lantus.  She is not checking 2-hour postprandial sugars however she denies any hypoglycemic episodes.  She does have chronic diabetic neuropathy for which she takes gabapentin.  However she states that her pain in her feet is relatively well controlled.  She discontinued Lasix after I last saw her and has resumed hydrochlorothiazide.  Her blood pressure today is well controlled at 120/74.  She denies any angina.  She denies any dyspnea on exertion beyond her baseline.  She denies any shortness of breath at rest.  She denies any polyuria, polydipsia, or blurry vision.  She has not yet had her Covid vaccine.  I gave the patient contact information to schedule this and strongly encouraged.  Diabetic foot exam was performed today and is relatively normal.  Patient has already had her flu shot.  She denies any myalgias or right upper quadrant pain. Past Medical History:  Diagnosis Date  . Abnormal weight gain   . Arthritis    "joints" (06/13/2013)  . COPD (chronic obstructive pulmonary disease) ([place])   . Coronary artery disease, non-occlusive 06/2013   Cardiac Cath: sharp Angle take-off of Large Dominant Cx: ~70-80% mid Cx bifurcation lesion (Lateral OM &  AVGCx-PL-PDA both with hairpin ostial & ~50% lesions) - Not PCI amenable due to vessel tortuosity; ~40-50% mid LAD;Ramus - no significnat diseaes; small non-dominant RCA  . Diverticulosis   . GERD (gastroesophageal reflux disease)   . History of stress test    a. 09/2006 nl Dobutamine Echo  . Hyperlipidemia   . Hypertension   . NIDDM (non-insulin dependent diabetes mellitus)   . On home oxygen therapy    "2L q hs; runs into my BIPAP" (06/13/2013)   . OSA (obstructive sleep apnea)    "BIPAP w/O2" (06/13/2013)  . Tracheobronchomalacia    a. followed by [facility].   Past Surgical History:  Procedure Laterality Date  . CARDIAC CATHETERIZATION  06/2013   Cardiac Cath: sharp Angle take-off of Large Dominant Cx: ~70-80% mid Cx bifurcation lesion (Lateral OM &  AVGCx-PL-PDA both with hairpin ostial & ~50% lesions) - Not PCI amenable due to vessel tortuosity; ~40-50% mid LAD;Ramus - no significnat diseaes; small non-dominant RCA  . LEFT HEART CATHETERIZATION WITH CORONARY ANGIOGRAM N/A 06/16/2013   Procedure: LEFT HEART CATHETERIZATION WITH CORONARY ANGIOGRAM;  Surgeon: [name], MD;  Location[facility] LAB;  Service: Cardiovascular;  Laterality: N/A;  . TRANSTHORACIC ECHOCARDIOGRAM  06/17/2013   Normal LV size and function. EF 55-60% with no regional WMA. Grade 1 diastolic dysfunction. No significant valvular lesions  . TUBAL LIGATION     Current Outpatient Medications on File Prior to Visit  Medication Sig Dispense Refill  . aspirin 81 MG tablet Take 81 mg by mouth daily.      . Calcium Carbonate-Vitamin D (CALCIUM 600 + D PO) Take 1 tablet by mouth daily.      . furosemide (LASIX) 40 MG tablet Take 1 tablet (40 mg total) by mouth daily. 30 tablet 0  . gabapentin (NEURONTIN) 600 MG tablet TAKE 1 TABLET BY MOUTH 4 TIMES DAILY 360 tablet 1  . hydrochlorothiazide (  MICROZIDE) 12.5 MG capsule TAKE 1 CAPSULE BY MOUTH ONCE DAILY 90 capsule 3  . Insulin Glargine (LANTUS SOLOSTAR) 100 UNIT/ML Solostar Pen INJECT 40-50 UNITS UNDER THE SKIN ONCE EVERY NIGHT AT BEDTIME. 15 mL 3  . isosorbide mononitrate (IMDUR) 60 MG 24 hr tablet TAKE 1 TABLET BY MOUTH ONCE DAILY 30 tablet 1  . metFORMIN (GLUCOPHAGE) 500 MG tablet Take 1 tablet (500 mg total) by mouth 2 (two) times daily with a meal. 180 tablet 3  . metoprolol tartrate (LOPRESSOR) 25 MG tablet Take 1 tablet (25 mg total) by mouth 2 (two) times daily. 180 tablet 3  . Multiple Vitamin (MULTIVITAMIN)  capsule Take 1 capsule by mouth daily.      . nitroGLYCERIN (NITROSTAT) 0.4 MG SL tablet Place 1 tablet (0.4 mg total) under the tongue every 5 (five) minutes x 3 doses as needed for chest pain. 25 tablet 4  . Omega-3 Fatty Acids (FISH OIL) 1000 MG CAPS Take 1 capsule by mouth daily.      Marland Kitchen omeprazole (PRILOSEC) 20 MG capsule Take 20 mg by mouth daily.    . rosuvastatin (CRESTOR) 40 MG tablet TAKE 1 TABLET BY MOUTH ONCE DAILY 30 tablet 1   No current facility-administered medications on file prior to visit.   Allergies  Allergen Reactions  . Neomycin Other (See Comments)    Visual disturbance and swelling in eyes   Social History   Socioeconomic History  . Marital status: Married    Spouse name: Not on file  . Number of children: 3  . Years of education: Not on file  . Highest education level: Not on file  Occupational History  . Occupation: Systems analyst  Tobacco Use  . Smoking status: Former Smoker    Packs/day: 1.50    Years: 20.00    Pack years: 30.00    Types: Cigarettes    Quit date: 02/13/1986    Years since quitting: 33.0  . Smokeless tobacco: Never Used  Substance and Sexual Activity  . Alcohol use: No  . Drug use: No  . Sexual activity: Yes  Other Topics Concern  . Not on file  Social History Narrative   Lives in [place] with her husband. 3 children.  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   Former 30 Pk/yr Smoker (1.5 PPD x 20 yr) - quit in 1998.   Social Determinants of Health   Financial Resource Strain:   . Difficulty of Paying Living Expenses: Not on file  Food Insecurity:   . Worried About Charity fundraiser in the Last Year: Not on file  . Ran Out of Food in the Last Year: Not on file  Transportation Needs:   . Lack of Transportation (Medical): Not on file  . Lack of Transportation (Non-Medical): Not on file  Physical Activity:   . Days of Exercise per Week: Not on file  . Minutes of Exercise per Session: Not on file  Stress:   . Feeling of Stress :  Not on file  Social Connections:   . Frequency of Communication with Friends and Family: Not on file  . Frequency of Social Gatherings with Friends and Family: Not on file  . Attends Religious Services: Not on file  . Active Member of Clubs or Organizations: Not on file  . Attends Archivist Meetings: Not on file  . Marital Status: Not on file  Intimate Partner Violence:   . Fear of Current or Ex-Partner: Not on file  . Emotionally Abused: Not  on file  . Physically Abused: Not on file  . Sexually Abused: Not on file      Review of Systems  All other systems reviewed and are negative.      Objective:   Physical Exam Constitutional:      Appearance: Normal appearance.  Cardiovascular:     Rate and Rhythm: Normal rate and regular rhythm.     Heart sounds: Murmur present.  Pulmonary:     Effort: Pulmonary effort is normal. No respiratory distress.     Breath sounds: Normal breath sounds. No stridor. No wheezing, rhonchi or rales.  Musculoskeletal:     Right lower leg: No edema.     Left lower leg: No edema.  Neurological:     Mental Status: She is alert.           Assessment & Plan:  Controlled type 2 diabetes mellitus with complication, without long-term current use of insulin ([place]) - Plan: Hemoglobin A1c, CBC with Differential/Platelet, COMPLETE METABOLIC PANEL WITH GFR, Lipid panel  Subclinical hypothyroidism - Plan: TSH  Essential hypertension - Plan: CBC with Differential/Platelet, COMPLETE METABOLIC PANEL WITH GFR, Lipid panel  Pure hypercholesterolemia - Plan: COMPLETE METABOLIC PANEL WITH GFR, Lipid panel  Fasting blood sugars in the morning sound well controlled.  I will check a hemoglobin A1c today.  For her age, I would be is satisfied with a hemoglobin A1c between 7 and 7.5.  Also check a fasting lipid panel.  Given her history of coronary artery disease, her goal LDL cholesterol is less than 70.  Blood pressure today is well controlled.  I will  make no changes on her blood pressure medication.  I will check a TSH to ensure adequate dosage of her levothyroxine.  Spent 10 minutes discussing the COVID-19 vaccine.  Gave her contact information and instructions on how to schedule this.  Strongly recommended this vaccine.  Flu shot is up-to-date.  Diabetic foot exam was performed today and is normal.  Spent 25 minutes today with the patient performing examination and discussing her health issues.

## 2019-03-05 LAB — COMPLETE METABOLIC PANEL WITH GFR
AG Ratio: 1.5 (calc) (ref 1.0–2.5)
ALT: 46 U/L — ABNORMAL HIGH (ref 6–29)
AST: 40 U/L — ABNORMAL HIGH (ref 10–35)
Albumin: 4.1 g/dL (ref 3.6–5.1)
Alkaline phosphatase (APISO): 47 U/L (ref 37–153)
BUN: 25 mg/dL (ref 7–25)
CO2: 33 mmol/L — ABNORMAL HIGH (ref 20–32)
Calcium: 10.6 mg/dL — ABNORMAL HIGH (ref 8.6–10.4)
Chloride: 97 mmol/L — ABNORMAL LOW (ref 98–110)
Creat: 0.88 mg/dL (ref 0.60–0.93)
GFR, Est African American: 73 mL/min/{1.73_m2} (ref >=60)
GFR, Est Non African American: 63 mL/min/{1.73_m2} (ref >=60)
Globulin: 2.7 g/dL (calc) (ref 1.9–3.7)
Glucose, Bld: 160 mg/dL — ABNORMAL HIGH (ref 65–99)
Potassium: 4.6 mmol/L (ref 3.5–5.3)
Sodium: 141 mmol/L (ref 135–146)
Total Bilirubin: 0.5 mg/dL (ref 0.2–1.2)
Total Protein: 6.8 g/dL (ref 6.1–8.1)

## 2019-03-05 LAB — LIPID PANEL
Cholesterol: 112 mg/dL (ref <200)
HDL: 44 mg/dL — ABNORMAL LOW (ref >=50)
LDL Cholesterol (Calc): 39 mg/dL (calc)
Non-HDL Cholesterol (Calc): 68 mg/dL (calc) (ref <130)
Total CHOL/HDL Ratio: 2.5 (calc) (ref <5.0)
Triglycerides: 231 mg/dL — ABNORMAL HIGH (ref <150)

## 2019-03-05 LAB — TSH: TSH: 2.9 mIU/L (ref 0.40–4.50)

## 2019-03-05 LAB — CBC WITH DIFFERENTIAL/PLATELET
Absolute Monocytes: 635 cells/uL (ref 200–950)
Basophils Absolute: 22 cells/uL (ref 0–200)
Basophils Relative: 0.3 %
Eosinophils Absolute: 80 cells/uL (ref 15–500)
Eosinophils Relative: 1.1 %
HCT: 45.4 % — ABNORMAL HIGH (ref 35.0–45.0)
Hemoglobin: 14.8 g/dL (ref 11.7–15.5)
Lymphs Abs: 2818 cells/uL (ref 850–3900)
MCH: 28.6 pg (ref 27.0–33.0)
MCHC: 32.6 g/dL (ref 32.0–36.0)
MCV: 87.8 fL (ref 80.0–100.0)
MPV: 11.2 fL (ref 7.5–12.5)
Monocytes Relative: 8.7 %
Neutro Abs: 3745 cells/uL (ref 1500–7800)
Neutrophils Relative %: 51.3 %
Platelets: 206 10*3/uL (ref 140–400)
RBC: 5.17 10*6/uL — ABNORMAL HIGH (ref 3.80–5.10)
RDW: 13 % (ref 11.0–15.0)
Total Lymphocyte: 38.6 %
WBC: 7.3 10*3/uL (ref 3.8–10.8)

## 2019-03-05 LAB — HEMOGLOBIN A1C
Hgb A1c MFr Bld: 7.6 % of total Hgb — ABNORMAL HIGH (ref <5.7)
Mean Plasma Glucose: 171 (calc)
eAG (mmol/L): 9.5 (calc)

## 2019-03-06 ENCOUNTER — Other Ambulatory Visit: Payer: Self-pay | Admitting: Family Medicine

## 2019-03-06 DIAGNOSIS — E119 Type 2 diabetes mellitus without complications: Secondary | ICD-10-CM | POA: Diagnosis not present

## 2019-03-06 DIAGNOSIS — R7989 Other specified abnormal findings of blood chemistry: Secondary | ICD-10-CM

## 2019-03-06 DIAGNOSIS — H02831 Dermatochalasis of right upper eyelid: Secondary | ICD-10-CM | POA: Diagnosis not present

## 2019-03-06 DIAGNOSIS — H02834 Dermatochalasis of left upper eyelid: Secondary | ICD-10-CM | POA: Diagnosis not present

## 2019-03-06 DIAGNOSIS — Z794 Long term (current) use of insulin: Secondary | ICD-10-CM | POA: Diagnosis not present

## 2019-03-06 LAB — HM DIABETES EYE EXAM

## 2019-03-11 ENCOUNTER — Other Ambulatory Visit: Payer: Self-pay | Admitting: Cardiology

## 2019-03-11 ENCOUNTER — Other Ambulatory Visit: Payer: Self-pay | Admitting: Family Medicine

## 2019-03-18 ENCOUNTER — Telehealth: Payer: Self-pay | Admitting: Family Medicine

## 2019-03-18 NOTE — Telephone Encounter (Signed)
Pt called to report bs readings  FBS  2 hr PP 103  140 114  150 114  145 124  144 122  140  123  150

## 2019-03-18 NOTE — Telephone Encounter (Signed)
These are great.  No changes needed

## 2019-03-18 NOTE — Telephone Encounter (Signed)
Pt aware via vm 

## 2019-03-25 ENCOUNTER — Other Ambulatory Visit: Payer: Self-pay

## 2019-03-25 ENCOUNTER — Other Ambulatory Visit: Payer: Medicare Other

## 2019-03-25 DIAGNOSIS — R7989 Other specified abnormal findings of blood chemistry: Secondary | ICD-10-CM

## 2019-03-25 LAB — COMPREHENSIVE METABOLIC PANEL
AG Ratio: 1.4 (calc) (ref 1.0–2.5)
ALT: 25 U/L (ref 6–29)
AST: 24 U/L (ref 10–35)
Albumin: 4 g/dL (ref 3.6–5.1)
Alkaline phosphatase (APISO): 44 U/L (ref 37–153)
BUN/Creatinine Ratio: 36 (calc) — ABNORMAL HIGH (ref 6–22)
BUN: 26 mg/dL — ABNORMAL HIGH (ref 7–25)
CO2: 35 mmol/L — ABNORMAL HIGH (ref 20–32)
Calcium: 10.6 mg/dL — ABNORMAL HIGH (ref 8.6–10.4)
Chloride: 99 mmol/L (ref 98–110)
Creat: 0.72 mg/dL (ref 0.60–0.93)
Globulin: 2.9 g/dL (calc) (ref 1.9–3.7)
Glucose, Bld: 153 mg/dL — ABNORMAL HIGH (ref 65–99)
Potassium: 4.6 mmol/L (ref 3.5–5.3)
Sodium: 142 mmol/L (ref 135–146)
Total Bilirubin: 0.4 mg/dL (ref 0.2–1.2)
Total Protein: 6.9 g/dL (ref 6.1–8.1)

## 2019-04-17 ENCOUNTER — Ambulatory Visit: Payer: Medicare Other | Attending: Internal Medicine

## 2019-04-17 DIAGNOSIS — Z23 Encounter for immunization: Secondary | ICD-10-CM

## 2019-04-17 NOTE — Progress Notes (Signed)
   Covid-19 Vaccination Clinic  Name:  GAYLEN ALBEE    MRN: [id number] DOB: [birth year]  04/17/2019  Ms. Plaisted was observed post Covid-19 immunization for 15 minutes without incident. She was provided with Vaccine Information Sheet and instruction to access the V-Safe system.   Ms. Scarpinato was instructed to call 911 with any severe reactions post vaccine: Marland Kitchen Difficulty breathing  . Swelling of face and throat  . A fast heartbeat  . A bad rash all over body  . Dizziness and weakness   Immunizations Administered    Name Date Dose VIS Date Route   Pfizer COVID-19 Vaccine 04/17/2019  9:18 AM 0.3 mL 01/24/2019 Intramuscular   Manufacturer: [place]   Lot: [id number]   [place]: [id number]

## 2019-05-13 ENCOUNTER — Ambulatory Visit: Payer: Medicare Other | Attending: Internal Medicine

## 2019-05-13 DIAGNOSIS — Z23 Encounter for immunization: Secondary | ICD-10-CM

## 2019-05-13 NOTE — Progress Notes (Signed)
   Covid-19 Vaccination Clinic  Name:  April [name]    MRN: [id number] DOB: [birth year]  05/13/2019  [name] was observed post Covid-19 immunization for 15 minutes without incident. She was provided with Vaccine Information Sheet and instruction to access the V-Safe system.   [name] was instructed to call 911 with any severe reactions post vaccine: Marland Kitchen Difficulty breathing  . Swelling of face and throat  . A fast heartbeat  . A bad rash all over body  . Dizziness and weakness   Immunizations Administered    Name Date Dose VIS Date Route   Pfizer COVID-19 Vaccine 05/13/2019  3:20 PM 0.3 mL 01/24/2019 Intramuscular   Manufacturer: [place]   Lot: [id number]   [place]: [id number]

## 2019-06-17 ENCOUNTER — Other Ambulatory Visit: Payer: Self-pay | Admitting: Family Medicine

## 2019-06-20 ENCOUNTER — Encounter: Payer: Self-pay | Admitting: Family Medicine

## 2019-06-20 ENCOUNTER — Other Ambulatory Visit: Payer: Self-pay

## 2019-06-20 ENCOUNTER — Ambulatory Visit (INDEPENDENT_AMBULATORY_CARE_PROVIDER_SITE_OTHER): Payer: Medicare Other | Admitting: Family Medicine

## 2019-06-20 VITALS — BP 128/76 | HR 70 | Temp 96.5°F | Resp 14 | Ht 62.0 in | Wt 177.0 lb

## 2019-06-20 DIAGNOSIS — M25561 Pain in right knee: Secondary | ICD-10-CM | POA: Diagnosis not present

## 2019-06-20 DIAGNOSIS — E119 Type 2 diabetes mellitus without complications: Secondary | ICD-10-CM | POA: Diagnosis not present

## 2019-06-20 DIAGNOSIS — R911 Solitary pulmonary nodule: Secondary | ICD-10-CM | POA: Diagnosis not present

## 2019-06-20 NOTE — Progress Notes (Signed)
Subjective:    Patient ID: April [name], female    DOB: 05-Mar-1940, 79 y.o.   MRN: [id number]  HPI Please see last office visit.  At that time, patient's liver function tests were slightly elevated.  We temporarily held her Crestor and her liver function test improved.  However given her history of coronary artery disease, we will resume her Crestor and have asked her to come back after 3 months to monitor her liver function test to make sure they have not worsened.  She denies any side effects on the Crestor.  Specifically she denies any chest pain or angina.  She is also due to monitor her hemoglobin A1c and she is overdue for urine microalbumin.  She has had a diabetic eye exam within the last year.  She is also had a COVID-19 vaccination. Past Medical History:  Diagnosis Date  . Abnormal weight gain   . Arthritis    "joints" (06/13/2013)  . COPD (chronic obstructive pulmonary disease) ([place])   . Coronary artery disease, non-occlusive 06/2013   Cardiac Cath: sharp Angle take-off of Large Dominant Cx: ~70-80% mid Cx bifurcation lesion (Lateral OM &  AVGCx-PL-PDA both with hairpin ostial & ~50% lesions) - Not PCI amenable due to vessel tortuosity; ~40-50% mid LAD;Ramus - no significnat diseaes; small non-dominant RCA  . Diverticulosis   . GERD (gastroesophageal reflux disease)   . History of stress test    a. 09/2006 nl Dobutamine Echo  . Hyperlipidemia   . Hypertension   . NIDDM (non-insulin dependent diabetes mellitus)   . On home oxygen therapy    "2L q hs; runs into my BIPAP" (06/13/2013)  . OSA (obstructive sleep apnea)    "BIPAP w/O2" (06/13/2013)  . Tracheobronchomalacia    a. followed by [facility].   Past Surgical History:  Procedure Laterality Date  . CARDIAC CATHETERIZATION  06/2013   Cardiac Cath: sharp Angle take-off of Large Dominant Cx: ~70-80% mid Cx bifurcation lesion (Lateral OM &  AVGCx-PL-PDA both with hairpin ostial & ~50% lesions) - Not PCI amenable due to vessel  tortuosity; ~40-50% mid LAD;Ramus - no significnat diseaes; small non-dominant RCA  . LEFT HEART CATHETERIZATION WITH CORONARY ANGIOGRAM N/A 06/16/2013   Procedure: LEFT HEART CATHETERIZATION WITH CORONARY ANGIOGRAM;  Surgeon: [name], MD;  Location: [facility] C[facility] LAB;  Service: Cardiovascular;  Laterality: N/A;  . TRANSTHORACIC ECHOCARDIOGRAM  06/17/2013   Normal LV size and function. EF 55-60% with no regional WMA. Grade 1 diastolic dysfunction. No significant valvular lesions  . TUBAL LIGATION     Current Outpatient Medications on File Prior to Visit  Medication Sig Dispense Refill  . aspirin 81 MG tablet Take 81 mg by mouth daily.      . Calcium Carbonate-Vitamin D (CALCIUM 600 + D PO) Take 1 tablet by mouth daily.      Marland Kitchen gabapentin (NEURONTIN) 600 MG tablet TAKE 1 TABLET BY MOUTH 4 TIMES DAILY 360 tablet 1  . hydrochlorothiazide (MICROZIDE) 12.5 MG capsule TAKE 1 CAPSULE BY MOUTH ONCE DAILY 90 capsule 3  . Insulin Glargine (LANTUS SOLOSTAR) 100 UNIT/ML Solostar Pen INJECT 40-50 UNITS UNDER THE SKIN ONCE EVERY NIGHT AT BEDTIME. 15 mL 3  . isosorbide mononitrate (IMDUR) 60 MG 24 hr tablet Take 1 tablet (60 mg total) by mouth daily. 90 tablet 2  . metFORMIN (GLUCOPHAGE) 500 MG tablet TAKE 1 TABLET BY MOUTH TWICE (2) DAILY WITH A MEAL 180 tablet 3  . metoprolol tartrate (LOPRESSOR) 25 MG tablet Take  1 tablet (25 mg total) by mouth 2 (two) times daily. 180 tablet 3  . Multiple Vitamin (MULTIVITAMIN) capsule Take 1 capsule by mouth daily.      . nitroGLYCERIN (NITROSTAT) 0.4 MG SL tablet Place 1 tablet (0.4 mg total) under the tongue every 5 (five) minutes x 3 doses as needed for chest pain. 25 tablet 4  . Omega-3 Fatty Acids (FISH OIL) 1000 MG CAPS Take 1 capsule by mouth daily.      Marland Kitchen omeprazole (PRILOSEC) 20 MG capsule Take 20 mg by mouth daily.    . rosuvastatin (CRESTOR) 40 MG tablet Take 1 tablet (40 mg total) by mouth daily. 90 tablet 2   No current facility-administered medications on  file prior to visit.   Allergies  Allergen Reactions  . Neomycin Other (See Comments)    Visual disturbance and swelling in eyes   Social History   Socioeconomic History  . Marital status: Married    Spouse name: Not on file  . Number of children: 3  . Years of education: Not on file  . Highest education level: Not on file  Occupational History  . Occupation: Systems analyst  Tobacco Use  . Smoking status: Former Smoker    Packs/day: 1.50    Years: 20.00    Pack years: 30.00    Types: Cigarettes    Quit date: 02/13/1986    Years since quitting: 33.3  . Smokeless tobacco: Never Used  Substance and Sexual Activity  . Alcohol use: No  . Drug use: No  . Sexual activity: Yes  Other Topics Concern  . Not on file  Social History Narrative   Lives in [place] with her husband. 3 children.  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   Former 30 Pk/yr Smoker (1.5 PPD x 20 yr) - quit in 1998.   Social Determinants of Health   Financial Resource Strain:   . Difficulty of Paying Living Expenses:   Food Insecurity:   . Worried About Charity fundraiser in the Last Year:   . Arboriculturist in the Last Year:   Transportation Needs:   . Film/video editor (Medical):   Marland Kitchen Lack of Transportation (Non-Medical):   Physical Activity:   . Days of Exercise per Week:   . Minutes of Exercise per Session:   Stress:   . Feeling of Stress :   Social Connections:   . Frequency of Communication with Friends and Family:   . Frequency of Social Gatherings with Friends and Family:   . Attends Religious Services:   . Active Member of Clubs or Organizations:   . Attends Archivist Meetings:   Marland Kitchen Marital Status:   Intimate Partner Violence:   . Fear of Current or Ex-Partner:   . Emotionally Abused:   Marland Kitchen Physically Abused:   . Sexually Abused:       Review of Systems  All other systems reviewed and are negative.      Objective:   Physical Exam Constitutional:      Appearance:  Normal appearance.  Cardiovascular:     Rate and Rhythm: Normal rate and regular rhythm.     Heart sounds: Murmur present.  Pulmonary:     Effort: Pulmonary effort is normal. No respiratory distress.     Breath sounds: Normal breath sounds. No stridor. No wheezing, rhonchi or rales.  Musculoskeletal:     Right lower leg: No edema.     Left lower leg: No edema.  Neurological:  Mental Status: She is alert.           Assessment & Plan:  Type 2 diabetes mellitus treated without insulin (HCC) - Plan: Hemoglobin A1c, COMPLETE METABOLIC PANEL WITH GFR, Microalbumin, urine Recheck CMP today to monitor liver function test.  If the LFTs are mildly elevated I would recheck them in 3 months.  If they are severely elevated I would discontinue the statin and consider switching to Zetia.  If there is no elevation at all we may space at her next checkup in 6 months.  Check hemoglobin A1c.  Goal hemoglobin A1c is less than 7.  Monitor urine microalbumin.

## 2019-06-21 LAB — HEMOGLOBIN A1C
Hgb A1c MFr Bld: 7 % of total Hgb — ABNORMAL HIGH (ref <5.7)
Mean Plasma Glucose: 154 (calc)
eAG (mmol/L): 8.5 (calc)

## 2019-06-21 LAB — COMPLETE METABOLIC PANEL WITH GFR
AG Ratio: 1.5 (calc) (ref 1.0–2.5)
ALT: 24 U/L (ref 6–29)
AST: 26 U/L (ref 10–35)
Albumin: 4.3 g/dL (ref 3.6–5.1)
Alkaline phosphatase (APISO): 49 U/L (ref 37–153)
BUN/Creatinine Ratio: 39 (calc) — ABNORMAL HIGH (ref 6–22)
BUN: 30 mg/dL — ABNORMAL HIGH (ref 7–25)
CO2: 35 mmol/L — ABNORMAL HIGH (ref 20–32)
Calcium: 10 mg/dL (ref 8.6–10.4)
Chloride: 98 mmol/L (ref 98–110)
Creat: 0.76 mg/dL (ref 0.60–0.93)
GFR, Est African American: 88 mL/min/{1.73_m2} (ref >=60)
GFR, Est Non African American: 76 mL/min/{1.73_m2} (ref >=60)
Globulin: 2.8 g/dL (calc) (ref 1.9–3.7)
Glucose, Bld: 100 mg/dL — ABNORMAL HIGH (ref 65–99)
Potassium: 3.9 mmol/L (ref 3.5–5.3)
Sodium: 142 mmol/L (ref 135–146)
Total Bilirubin: 0.4 mg/dL (ref 0.2–1.2)
Total Protein: 7.1 g/dL (ref 6.1–8.1)

## 2019-06-21 LAB — MICROALBUMIN, URINE: Microalb, Ur: 21.1 mg/dL

## 2019-06-30 ENCOUNTER — Other Ambulatory Visit: Payer: Self-pay | Admitting: Family Medicine

## 2019-07-02 ENCOUNTER — Encounter (INDEPENDENT_AMBULATORY_CARE_PROVIDER_SITE_OTHER): Payer: Medicare Other | Admitting: Ophthalmology

## 2019-07-09 ENCOUNTER — Other Ambulatory Visit: Payer: Self-pay | Admitting: Family Medicine

## 2019-07-25 DIAGNOSIS — M48061 Spinal stenosis, lumbar region without neurogenic claudication: Secondary | ICD-10-CM | POA: Diagnosis not present

## 2019-07-28 ENCOUNTER — Telehealth: Payer: Self-pay | Admitting: Family Medicine

## 2019-07-28 NOTE — Telephone Encounter (Signed)
Patient called in stating that her blood sugars have been elevated in the upper 100's, almost 200's since Friday after steroid injection in her back. She denies any hyperglycemic symptoms however she is concerned and wants to know if you recommend increasing her medications. Please advise?

## 2019-07-29 NOTE — Telephone Encounter (Signed)
No, it is due to steroid shot and they will come down soon likely within the next week on their own as the body clears the steroid.  If not improving over the next week, let me know.

## 2019-07-29 NOTE — Telephone Encounter (Signed)
Spoke with patient and informed her of recommendations by Dr. Dennard Schaumann. Patient verbalized understanding.

## 2019-08-05 ENCOUNTER — Other Ambulatory Visit: Payer: Self-pay

## 2019-08-06 ENCOUNTER — Other Ambulatory Visit: Payer: Self-pay

## 2019-08-12 DIAGNOSIS — G4733 Obstructive sleep apnea (adult) (pediatric): Secondary | ICD-10-CM | POA: Diagnosis not present

## 2019-08-15 ENCOUNTER — Other Ambulatory Visit: Payer: Self-pay

## 2019-08-19 ENCOUNTER — Other Ambulatory Visit: Payer: Self-pay

## 2019-08-19 MED ORDER — GLUCOSE BLOOD VI STRP: 1.0000 | ORAL_STRIP | 1 refills | Status: DC | PRN

## 2019-09-05 DIAGNOSIS — M48061 Spinal stenosis, lumbar region without neurogenic claudication: Secondary | ICD-10-CM | POA: Diagnosis not present

## 2019-10-24 ENCOUNTER — Other Ambulatory Visit: Payer: Self-pay | Admitting: Cardiology

## 2019-11-08 ENCOUNTER — Other Ambulatory Visit: Payer: Self-pay | Admitting: Family Medicine

## 2019-11-20 ENCOUNTER — Ambulatory Visit: Payer: Medicare Other | Admitting: Adult Health

## 2019-11-20 ENCOUNTER — Encounter: Payer: Self-pay | Admitting: Adult Health

## 2019-11-20 ENCOUNTER — Other Ambulatory Visit: Payer: Self-pay

## 2019-11-20 VITALS — BP 100/70 | HR 73 | Temp 97.3°F | Ht 61.0 in | Wt 171.6 lb

## 2019-11-20 DIAGNOSIS — G4733 Obstructive sleep apnea (adult) (pediatric): Secondary | ICD-10-CM | POA: Diagnosis not present

## 2019-11-20 DIAGNOSIS — E669 Obesity, unspecified: Secondary | ICD-10-CM

## 2019-11-20 DIAGNOSIS — Z23 Encounter for immunization: Secondary | ICD-10-CM

## 2019-11-20 DIAGNOSIS — J398 Other specified diseases of upper respiratory tract: Secondary | ICD-10-CM

## 2019-11-20 NOTE — Progress Notes (Signed)
@Patient  ID: April [name], female    DOB: Jul 12, 1940, 79 y.o.   MRN: [id number]  Chief Complaint  Patient presents with  . Follow-up    OSA     Referring provider: Susy Frizzle, MD  HPI: 79 year old female former smoker followed for mild obstructive sleep apnea and tracheobronchomalacia Medical history significant for large hiatal hernia and GERD on PPI  TEST/EVENTS :  HRCT 03/2007 &05/2008 did not show any evidence of pulmonary fibrosis . Large hiatal hernia was noted.  PFT initially showed intraparenchymal restriction with a diffuse capacity of 49%. PFTs 09/2007 >>marked improvment, FEV1% was 78, FEV68 %, FVC 63%, TLC 67%, DLCO remains 51%.  PSG did reveal mild obstructive sleep apnea. On BiPAP +10/5 &is able to sleep supine.  Echo- no rt-->LT shunt  Feb, 2012 - Hoarseness resolved - lisinopril stopped, neg ENt evaluation [name]) Does have occasional GERD.  06/17/2010 -ONO on bipap/ RA desaturation for about 6 mins -   08/22/2012 spirometry >> FVC 75% , mild restriction - improved slightly  New BiPAP in 2018  11/20/2019 Follow up : OSA and Tracheobronchomalacia  Patient returns for a 1 year follow-up.  Patient has underlying obstructive sleep apnea.  Is on nocturnal BiPAP.  Patient says she is doing well on BiPAP.  She feels rested with no significant daytime sleepiness.  She says she never goes to sleep without her BiPAP.  BiPAP download shows excellent compliance with 100% usage.  Daily average usage around 8 hours.  She is on IPAP 10 and EPAP 5 cm H2O.  AHI 1.2. Wears full face mask.   Patient has known tracheobronchomalacia with associated dyspnea.  She says overall breathing is doing okay.  She denies any flare of her shortness of breath or decreased activity tolerance. She is maintained on a PPI for large hiatal hernia and GERD without flare of cough or reflux symptoms  Works part time in school [employer] . Very active.   Trying to eat better, weight down 10lbs .     Allergies  Allergen Reactions  . Neomycin Other (See Comments)    Visual disturbance and swelling in eyes    Immunization History  Administered Date(s) Administered  . Fluad Quad(high Dose 65+) 11/11/2018, 11/20/2019  . Influenza Nasal 11/14/2011  . Influenza Split 11/02/2015  . Influenza Whole 11/26/2006, 11/27/2008, 11/16/2009  . Influenza,inj,Quad PF,6+ Mos 12/11/2012, 12/22/2013, 11/03/2014, 11/23/2014, 12/10/2017  . PFIZER SARS-COV-2 Vaccination 04/17/2019, 05/13/2019  . Pneumococcal Conjugate-13 01/21/2013  . Pneumococcal Polysaccharide-23 03/19/2006, 12/22/2013    Past Medical History:  Diagnosis Date  . Abnormal weight gain   . Arthritis    "joints" (06/13/2013)  . COPD (chronic obstructive pulmonary disease) ([place])   . Coronary artery disease, non-occlusive 06/2013   Cardiac Cath: sharp Angle take-off of Large Dominant Cx: ~70-80% mid Cx bifurcation lesion (Lateral OM &  AVGCx-PL-PDA both with hairpin ostial & ~50% lesions) - Not PCI amenable due to vessel tortuosity; ~40-50% mid LAD;Ramus - no significnat diseaes; small non-dominant RCA  . Diverticulosis   . GERD (gastroesophageal reflux disease)   . History of stress test    a. 09/2006 nl Dobutamine Echo  . Hyperlipidemia   . Hypertension   . NIDDM (non-insulin dependent diabetes mellitus)   . On home oxygen therapy    "2L q hs; runs into my BIPAP" (06/13/2013)  . OSA (obstructive sleep apnea)    "BIPAP w/O2" (06/13/2013)  . Tracheobronchomalacia    a. followed by [facility].    Tobacco  History: Social History   Tobacco Use  Smoking Status Former Smoker  . Packs/day: 1.50  . Years: 20.00  . Pack years: 30.00  . Types: Cigarettes  . Quit date: 02/13/1986  . Years since quitting: 33.7  Smokeless Tobacco Never Used   Counseling given: Not Answered   Outpatient Medications Prior to Visit  Medication Sig Dispense Refill  . aspirin 81 MG tablet Take 81 mg by mouth daily.      . Calcium Carbonate-Vitamin D  (CALCIUM 600 + D PO) Take 1 tablet by mouth daily.      Marland Kitchen gabapentin (NEURONTIN) 600 MG tablet TAKE 1 TABLET BY MOUTH 4 TIMES DAILY 360 tablet 1  . glucose blood test strip 1 each by Other route as needed. Use as instructed 100 each 1  . hydrochlorothiazide (MICROZIDE) 12.5 MG capsule TAKE 1 CAPSULE BY MOUTH ONCE DAILY 90 capsule 3  . isosorbide mononitrate (IMDUR) 60 MG 24 hr tablet Take 1 tablet (60 mg total) by mouth daily. 90 tablet 2  . LANTUS SOLOSTAR 100 UNIT/ML Solostar Pen INJECT 40-50 UNITS UNDER THE SKIN ONCE EVERY NIGHT AT BEDTIME. 15 mL 3  . metFORMIN (GLUCOPHAGE) 500 MG tablet TAKE 1 TABLET BY MOUTH TWICE (2) DAILY WITH A MEAL 180 tablet 3  . metoprolol tartrate (LOPRESSOR) 25 MG tablet TAKE 1 TABLET BY MOUTH TWICE A DAY 180 tablet 1  . Multiple Vitamin (MULTIVITAMIN) capsule Take 1 capsule by mouth daily.      . nitroGLYCERIN (NITROSTAT) 0.4 MG SL tablet Place 1 tablet (0.4 mg total) under the tongue every 5 (five) minutes x 3 doses as needed for chest pain. 25 tablet 4  . Omega-3 Fatty Acids (FISH OIL) 1000 MG CAPS Take 1 capsule by mouth daily.      Marland Kitchen omeprazole (PRILOSEC) 20 MG capsule Take 20 mg by mouth daily.    . rosuvastatin (CRESTOR) 40 MG tablet Take 1 tablet (40 mg total) by mouth daily. 90 tablet 2   No facility-administered medications prior to visit.     Review of Systems:   Constitutional:   No  weight loss, night sweats,  Fevers, chills,  +fatigue, or  lassitude.  HEENT:   No headaches,  Difficulty swallowing,  Tooth/dental problems, or  Sore throat,                No sneezing, itching, ear ache, nasal congestion, post nasal drip,   CV:  No chest pain,  Orthopnea, PND, swelling in lower extremities, anasarca, dizziness, palpitations, syncope.   GI  No heartburn, indigestion, abdominal pain, nausea, vomiting, diarrhea, change in bowel habits, loss of appetite, bloody stools.   Resp:  No excess mucus, no productive cough,  No non-productive cough,  No  coughing up of blood.  No change in color of mucus.  No wheezing.  No chest wall deformity  Skin: no rash or lesions.  GU: no dysuria, change in color of urine, no urgency or frequency.  No flank pain, no hematuria   MS:  No joint pain or swelling.  No decreased range of motion.  No back pain.    Physical Exam  BP 100/70 (BP Location: Left Arm, Patient Position: Sitting, Cuff Size: Normal)   Pulse 73   Temp (!) 97.3 F (36.3 C)   Ht 5\' 1"  (1.549 m)   Wt 171 lb 9.6 oz (77.8 kg)   LMP  (LMP Unknown)   SpO2 93%   BMI 32.42 kg/m   GEN: A/Ox3; pleasant ,  NAD, well nourished, BMI 32   HEENT:  [place]/AT,  NOSE-clear, THROAT-clear, no lesions, no postnasal drip or exudate noted.   NECK:  Supple w/ fair ROM; no JVD; normal carotid impulses w/o bruits; no thyromegaly or nodules palpated; no lymphadenopathy.    RESP  Clear  P & A; w/o, wheezes/ rales/ or rhonchi. no accessory muscle use, no dullness to percussion  CARD:  RRR, no m/r/g, no peripheral edema, pulses intact, no cyanosis or clubbing.  GI:   Soft & nt; nml bowel sounds; no organomegaly or masses detected.   Musco: Warm bil, no deformities or joint swelling noted.   Neuro: alert, no focal deficits noted.    Skin: Warm, no lesions or rashes    Lab Results:  CBC  BMET  No results found.    No flowsheet data found.  No results found for: NITRICOXIDE      Assessment & Plan:   OSA treated with BiPAP Excellent control and compliance on nocturnal BiPAP  Plan  Patient Instructions  Continue on BIPAP At bedtime   Do not drive if sleepy .  Work on healthy weight  Activity as tolerated.  Flu shot today .  Follow up with Dr. Elsworth Soho  In 1 year and As needed        Tracheobronchomalacia Appears to be at baseline without flare in symptoms continue on current regimen activity as tolerated control for triggers.  And continue on nocturnal BiPAP  Obesity (BMI 30-39.9) Healthy weight loss     Rexene Edison,  NP 11/20/2019

## 2019-11-20 NOTE — Assessment & Plan Note (Signed)
Excellent control and compliance on nocturnal BiPAP  Plan  Patient Instructions  Continue on BIPAP At bedtime   Do not drive if sleepy .  Work on healthy weight  Activity as tolerated.  Flu shot today .  Follow up with Dr. Elsworth Soho  In 1 year and As needed

## 2019-11-20 NOTE — Assessment & Plan Note (Signed)
Healthy weight loss 

## 2019-11-20 NOTE — Assessment & Plan Note (Signed)
Appears to be at baseline without flare in symptoms continue on current regimen activity as tolerated control for triggers.  And continue on nocturnal BiPAP

## 2019-11-20 NOTE — Patient Instructions (Addendum)
Continue on BIPAP At bedtime   Do not drive if sleepy .  Work on healthy weight  Activity as tolerated.  Flu shot today .  Follow up with Dr. Elsworth Soho  In 1 year and As needed

## 2019-11-26 ENCOUNTER — Other Ambulatory Visit: Payer: Self-pay | Admitting: Cardiology

## 2019-11-26 NOTE — Telephone Encounter (Signed)
Rx request sent to pharmacy.  

## 2019-12-16 DIAGNOSIS — M48061 Spinal stenosis, lumbar region without neurogenic claudication: Secondary | ICD-10-CM | POA: Diagnosis not present

## 2020-01-06 DIAGNOSIS — G4733 Obstructive sleep apnea (adult) (pediatric): Secondary | ICD-10-CM | POA: Diagnosis not present

## 2020-02-02 ENCOUNTER — Other Ambulatory Visit: Payer: Self-pay | Admitting: Family Medicine

## 2020-02-02 DIAGNOSIS — Z1231 Encounter for screening mammogram for malignant neoplasm of breast: Secondary | ICD-10-CM

## 2020-02-03 ENCOUNTER — Ambulatory Visit
Admission: RE | Admit: 2020-02-03 | Discharge: 2020-02-03 | Disposition: A | Discharge status: Home / Self Care | Payer: Medicare Other | Source: Ambulatory Visit | Attending: Family Medicine | Admitting: Family Medicine

## 2020-02-03 ENCOUNTER — Other Ambulatory Visit: Payer: Self-pay

## 2020-02-03 DIAGNOSIS — Z1231 Encounter for screening mammogram for malignant neoplasm of breast: Secondary | ICD-10-CM

## 2020-02-10 DIAGNOSIS — M48061 Spinal stenosis, lumbar region without neurogenic claudication: Secondary | ICD-10-CM | POA: Diagnosis not present

## 2020-02-16 ENCOUNTER — Ambulatory Visit: Payer: Medicare Other | Admitting: Cardiology

## 2020-03-03 ENCOUNTER — Other Ambulatory Visit: Payer: Self-pay

## 2020-03-03 MED ORDER — GLUCOSE BLOOD VI STRP: 1.0000 | ORAL_STRIP | 1 refills | Status: DC | PRN

## 2020-03-08 DIAGNOSIS — E119 Type 2 diabetes mellitus without complications: Secondary | ICD-10-CM | POA: Diagnosis not present

## 2020-03-08 DIAGNOSIS — D3132 Benign neoplasm of left choroid: Secondary | ICD-10-CM | POA: Diagnosis not present

## 2020-03-08 DIAGNOSIS — H02831 Dermatochalasis of right upper eyelid: Secondary | ICD-10-CM | POA: Diagnosis not present

## 2020-03-08 DIAGNOSIS — D3131 Benign neoplasm of right choroid: Secondary | ICD-10-CM | POA: Diagnosis not present

## 2020-03-08 LAB — HM DIABETES EYE EXAM

## 2020-03-09 ENCOUNTER — Encounter: Payer: Self-pay | Admitting: Family Medicine

## 2020-03-18 ENCOUNTER — Ambulatory Visit: Payer: Medicare Other | Admitting: Cardiology

## 2020-03-18 ENCOUNTER — Other Ambulatory Visit: Payer: Self-pay

## 2020-03-18 ENCOUNTER — Encounter: Payer: Self-pay | Admitting: Cardiology

## 2020-03-18 VITALS — BP 130/69 | HR 74 | Ht 61.0 in | Wt 171.0 lb

## 2020-03-18 DIAGNOSIS — I200 Unstable angina: Secondary | ICD-10-CM

## 2020-03-18 DIAGNOSIS — I25119 Atherosclerotic heart disease of native coronary artery with unspecified angina pectoris: Secondary | ICD-10-CM

## 2020-03-18 DIAGNOSIS — E785 Hyperlipidemia, unspecified: Secondary | ICD-10-CM

## 2020-03-18 DIAGNOSIS — I10 Essential (primary) hypertension: Secondary | ICD-10-CM | POA: Diagnosis not present

## 2020-03-18 DIAGNOSIS — E1169 Type 2 diabetes mellitus with other specified complication: Secondary | ICD-10-CM | POA: Diagnosis not present

## 2020-03-18 DIAGNOSIS — E669 Obesity, unspecified: Secondary | ICD-10-CM

## 2020-03-18 DIAGNOSIS — G4733 Obstructive sleep apnea (adult) (pediatric): Secondary | ICD-10-CM

## 2020-03-18 NOTE — Progress Notes (Signed)
Primary Care Provider: Susy Frizzle, MD Cardiologist: Glenetta Hew, MD Electrophysiologist: None  Clinic Note: Chief Complaint  Patient presents with  . Follow-up    No major complaints  . Coronary Artery Disease    No anginal chest pain, just stable dyspnea on exertion   ===================================  ASSESSMENT/PLAN   Problem List Items Addressed This Visit    Atherosclerotic heart disease of native coronary artery with angina pectoris ([place]) (Chronic)    No further anginal symptoms on medical management now.  Tolerating low-dose beta-blocker along with Imdur.  Plan:   Continue current dose of beta-blocker and Imdur.  -> There is room to potentially titrate up beta-blocker if necessary, but at this point we can hold off  Continue statin  On aspirin (okay to hold for procedures and surgeries.-5 days)      Hyperlipidemia associated with type 2 diabetes mellitus ([place]) (Chronic)    Most recent labs from a year ago showed excellent lipid control.  For now continue current dose of rosuvastatin.  Labs are being followed by PCP.  Made a dramatic difference being on rosuvastatin versus pravastatin.      Essential hypertension - Primary (Chronic)    Blood pressure looks good pretty much at goal for her 130/70-continue low-dose metoprolol plus HCTZ at low-dose. No longer having any edema.  She just had to do the HCTZ and not requiring any Lasix.      Relevant Orders   EKG 12-Lead (Completed)   Unstable angina - history of (Chronic)    No further unstable angina.  This is reassuring consistently recent circumflex was not amenable to PCI options given the tortuosity of the vessel.  Doing well on combination of Imdur plus metoprolol.  Has not required calcium channel blocker and/or Ranexa.      Obesity (BMI 30-39.9) (Chronic)    Would be nice for her to lose weight.  We discussed different options.      OSA treated with BiPAP (Chronic)    Needs to keep using.   Continue to follow with pulmonary medicine.       ===================================  HPI:    April [name] is a 80 y.o. female with a PMH notable for single-vessel CAD (severe LCx disease-not approachable by cath having PCI), COPD, OSA on BiPAP with chronic dyspnea who presents today for annual follow-up.   May 2015 - progressive/crescendo Angina: Cath: Severe single site CAD involving very unfavorable bifurcation of the very tortuous L Circumflex) -->Recommendation was Med Rx.  Also has chronic dyspnea from COPD as well as OSA on BiPAP.  April [name] was last seen on 01/27/2019-pretty stable.  No major complaints.  Still short of breath with exertion no anginal symptoms.  She still working at [employer] and gets tired after a long day at work. => No med changes.  No studies ordered.  Labs followed by PCP.  Recent Hospitalizations: None  Reviewed  CV studies:    The following studies were reviewed today: (if available, images/films reviewed: From Epic Chart or Care Everywhere) . N/A:   Interval History:   April [name] returns here today for routine follow-up actually doing fine.  She still has the same stable exertional dyspnea.  She gets quite worn out at the end of the day as work, but denies any anginal chest pain or pressure.    Other than some mild vertigo type symptoms when she is lying down the right side, she really has no major complaints.  No arrhythmias, or CHF symptoms.  No COPD exacerbations.  Using CPAP.   CV Review of Symptoms (Summary): positive for - dyspnea on exertion and Mild vertigo dizziness.  negative for - chest pain, edema, irregular heartbeat, orthopnea, palpitations, paroxysmal nocturnal dyspnea, rapid heart rate, shortness of breath or Lightheadedness or syncope/near syncope, TIA/amaurosis fugax, claudication  The patient does not have symptoms concerning for COVID-19 infection (fever, chills, cough, or new shortness of breath).    REVIEWED OF SYSTEMS   Review of Systems  Constitutional: Negative for malaise/fatigue and weight loss.  HENT: Negative for congestion and sinus pain.   Respiratory: Positive for shortness of breath (Stable DOE). Negative for cough.   Gastrointestinal: Negative for abdominal pain, blood in stool and melena.  Genitourinary: Negative for hematuria.  Musculoskeletal: Positive for joint pain (Mild arthritis pains.).  Neurological: Negative for focal weakness and weakness. Dizziness: Some positional, more vertiginous.  Psychiatric/Behavioral: Negative.    I have reviewed and (if needed) personally updated the patient's problem list, medications, allergies, past medical and surgical history, social and family history.   PAST MEDICAL HISTORY   Past Medical History:  Diagnosis Date  . Abnormal weight gain   . Arthritis    "joints" (06/13/2013)  . COPD (chronic obstructive pulmonary disease) ([place])   . Coronary artery disease, non-occlusive 06/2013   Cardiac Cath: sharp Angle take-off of Large Dominant Cx: ~70-80% mid Cx bifurcation lesion (Lateral OM &  AVGCx-PL-PDA both with hairpin ostial & ~50% lesions) - Not PCI amenable due to vessel tortuosity; ~40-50% mid LAD;Ramus - no significnat diseaes; small non-dominant RCA  . Diverticulosis   . GERD (gastroesophageal reflux disease)   . History of stress test    a. 09/2006 nl Dobutamine Echo  . Hyperlipidemia   . Hypertension   . NIDDM (non-insulin dependent diabetes mellitus)   . On home oxygen therapy    "2L q hs; runs into my BIPAP" (06/13/2013)  . OSA (obstructive sleep apnea)    "BIPAP w/O2" (06/13/2013)  . Tracheobronchomalacia    a. followed by [facility].    PAST SURGICAL HISTORY   Past Surgical History:  Procedure Laterality Date  . CARDIAC CATHETERIZATION  06/2013   Cardiac Cath: sharp Angle take-off of Large Dominant Cx: ~70-80% mid Cx bifurcation lesion (Lateral OM &  AVGCx-PL-PDA both with hairpin ostial & ~50% lesions) - Not  PCI amenable due to vessel tortuosity; ~40-50% mid LAD;Ramus - no significnat diseaes; small non-dominant RCA  . LEFT HEART CATHETERIZATION WITH CORONARY ANGIOGRAM N/A 06/16/2013   Procedure: LEFT HEART CATHETERIZATION WITH CORONARY ANGIOGRAM;  Surgeon: [name], MD;  Location: [facility] CATH LAB;  Service: Cardiovascular;  Laterality: N/A;  . TRANSTHORACIC ECHOCARDIOGRAM  06/17/2013   Normal LV size and function. EF 55-60% with no regional WMA. Grade 1 diastolic dysfunction. No significant valvular lesions  . TUBAL LIGATION      Immunization History  Administered Date(s) Administered  . Fluad Quad(high Dose 65+) 11/11/2018, 11/20/2019  . Influenza Nasal 11/14/2011  . Influenza Split 11/02/2015  . Influenza Whole 11/26/2006, 11/27/2008, 11/16/2009  . Influenza,inj,Quad PF,6+ Mos 12/11/2012, 12/22/2013, 11/03/2014, 11/23/2014, 12/10/2017  . PFIZER(Purple Top)SARS-COV-2 Vaccination 04/17/2019, 05/13/2019  . Pneumococcal Conjugate-13 01/21/2013  . Pneumococcal Polysaccharide-23 03/19/2006, 12/22/2013    MEDICATIONS/ALLERGIES   Current Meds  Medication Sig  . aspirin 81 MG tablet Take 81 mg by mouth daily.  . Calcium Carbonate-Vitamin D (CALCIUM 600 + D PO) Take 1 tablet by mouth daily.  Marland Kitchen gabapentin (NEURONTIN) 600 MG tablet TAKE  1 TABLET BY MOUTH 4 TIMES DAILY  . glucose blood test strip 1 each by Other route as needed. Use as instructed  . hydrochlorothiazide (MICROZIDE) 12.5 MG capsule TAKE 1 CAPSULE BY MOUTH ONCE DAILY  . isosorbide mononitrate (IMDUR) 60 MG 24 hr tablet TAKE 1 TABLET BY MOUTH ONCE DAILY  . LANTUS SOLOSTAR 100 UNIT/ML Solostar Pen INJECT 40-50 UNITS UNDER THE SKIN ONCE EVERY NIGHT AT BEDTIME.  . metFORMIN (GLUCOPHAGE) 500 MG tablet TAKE 1 TABLET BY MOUTH TWICE (2) DAILY WITH A MEAL  . metoprolol tartrate (LOPRESSOR) 25 MG tablet TAKE 1 TABLET BY MOUTH TWICE A DAY  . Multiple Vitamin (MULTIVITAMIN) capsule Take 1 capsule by mouth daily.  . nitroGLYCERIN (NITROSTAT)  0.4 MG SL tablet Place 1 tablet (0.4 mg total) under the tongue every 5 (five) minutes x 3 doses as needed for chest pain.  . Omega-3 Fatty Acids (FISH OIL) 1000 MG CAPS Take 1 capsule by mouth daily.  Marland Kitchen omeprazole (PRILOSEC) 20 MG capsule Take 20 mg by mouth daily.  . rosuvastatin (CRESTOR) 40 MG tablet TAKE ONE TABLET BY MOUTH ONCE DAILY    Allergies  Allergen Reactions  . Neomycin Other (See Comments)    Visual disturbance and swelling in eyes    SOCIAL HISTORY/FAMILY HISTORY   Reviewed in Epic:  Pertinent findings:  Social History   Tobacco Use  . Smoking status: Former Smoker    Packs/day: 1.50    Years: 20.00    Pack years: 30.00    Types: Cigarettes    Quit date: 02/13/1986    Years since quitting: 34.1  . Smokeless tobacco: Never Used  Substance Use Topics  . Alcohol use: No  . Drug use: No   Social History   Social History Narrative   Lives in [place] with her husband. 3 children.  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   Former 30 Pk/yr Smoker (1.5 PPD x 20 yr) - quit in 1998.    OBJCTIVE -PE, EKG, labs   Wt Readings from Last 3 Encounters:  03/18/20 171 lb (77.6 kg)  11/20/19 171 lb 9.6 oz (77.8 kg)  06/20/19 177 lb (80.3 kg)    Physical Exam: BP 130/69 (BP Location: Left Arm, Patient Position: Sitting)   Pulse 74   Ht 5\' 1"  (1.549 m)   Wt 171 lb (77.6 kg)   LMP  (LMP Unknown)   SpO2 91%   BMI 32.31 kg/m  Physical Exam Vitals reviewed.  Constitutional:      General: She is not in acute distress.    Appearance: Normal appearance. She is obese. She is not ill-appearing or toxic-appearing.  HENT:     Head: Normocephalic and atraumatic.  Neck:     Vascular: No carotid bruit.  Cardiovascular:     Rate and Rhythm: Normal rate and regular rhythm.     Pulses: Normal pulses.     Heart sounds: No murmur heard. No friction rub. No gallop.   Pulmonary:     Effort: Pulmonary effort is normal.     Comments: Mild interstitial sounds with no wheezes, rales or  rhonchi. Chest:     Chest wall: No tenderness.  Musculoskeletal:        General: No swelling. Normal range of motion.     Cervical back: Normal range of motion and neck supple.  Neurological:     General: No focal deficit present.     Mental Status: She is alert and oriented to person, place, and time.  Motor: No weakness.  Psychiatric:        Mood and Affect: Mood normal.        Behavior: Behavior normal.        Thought Content: Thought content normal.        Judgment: Judgment normal.      Adult ECG Report  Rate: 74;  Rhythm: normal sinus rhythm and Septal MI, age-indeterminate.  Otherwise normal axis, intervals and durations.;   Narrative Interpretation: Stable  Recent Labs: Reviewed, should have labs pending from PCP soon. Lab Results  Component Value Date   CHOL 112 03/04/2019   HDL 44 (L) 03/04/2019   LDLCALC 39 03/04/2019   TRIG 231 (H) 03/04/2019   CHOLHDL 2.5 03/04/2019   Lab Results  Component Value Date   CREATININE 0.76 06/20/2019   BUN 30 (H) 06/20/2019   NA 142 06/20/2019   K 3.9 06/20/2019   CL 98 06/20/2019   CO2 35 (H) 06/20/2019   CBC Latest Ref Rng & Units 03/04/2019 12/10/2017 06/07/2017  WBC 3.8 - 10.8 Thousand/uL 7.3 7.8 7.4  Hemoglobin 11.7 - 15.5 g/dL 14.8 15.3 14.6  Hematocrit 35.0 - 45.0 % 45.4(H) 46.7(H) 43.8  Platelets 140 - 400 Thousand/uL 206 244 227    Lab Results  Component Value Date   TSH 2.90 03/04/2019    ==================================================  COVID-19 Education: The signs and symptoms of COVID-19 were discussed with the patient and how to seek care for testing (follow up with PCP or arrange E-visit).   The importance of social distancing and COVID-19 vaccination was discussed today. The patient is practicing social distancing & Masking.   I spent a total of 14 minutes with the patient spent in direct patient consultation.  Additional time spent with chart review  / charting (studies, outside notes, etc): 14  min Total Time: 28 min  Current medicines are reviewed at length with the patient today.  (+/- concerns) n/a  This visit occurred during the SARS-CoV-2 public health emergency.  Safety protocols were in place, including screening questions prior to the visit, additional usage of staff PPE, and extensive cleaning of exam room while observing appropriate contact time as indicated for disinfecting solutions.  Notice: This dictation was prepared with Dragon dictation along with smaller phrase technology. Any transcriptional errors that result from this process are unintentional and may not be corrected upon review.  Patient Instructions / Medication Changes & Studies & Tests Ordered   Patient Instructions  Medication Instructions:  No changes   *If you need a refill on your cardiac medications before your next appointment, please call your pharmacy*   Lab Work: Not needed   Testing/Procedures: Not needed   Follow-Up: At [facility], you and your health needs are our priority.  As part of our continuing mission to provide you with exceptional heart care, we have created designated Provider Care Teams.  These Care Teams include your primary Cardiologist (physician) and Advanced Practice Providers (APPs -  Physician Assistants and Nurse Practitioners) who all work together to provide you with the care you need, when you need it.  We recommend signing up for the patient portal called "MyChart".  Sign up information is provided on this After Visit Summary.  MyChart is used to connect with patients for Virtual Visits (Telemedicine).  Patients are able to view lab/test results, encounter notes, upcoming appointments, etc.  Non-urgent messages can be sent to your provider as well.   To learn more about what you can do with MyChart,  go to [email or web address].    Your next appointment:   12 month(s)  The format for your next appointment:   In Person  Provider:   Glenetta Hew,  MD   Other Instructions  Kenefic 80 oz a day     Studies Ordered:   Orders Placed This Encounter  Procedures  . EKG 12-Lead     Glenetta Hew, M.D., M.S. Interventional Cardiologist   Pager # [phone] Phone # [phone] [address]. [place], [place] [address]   Thank you for choosing Heart[facility] at [facility]!!

## 2020-03-18 NOTE — Patient Instructions (Signed)
Medication Instructions:  No changes   *If you need a refill on your cardiac medications before your next appointment, please call your pharmacy*   Lab Work: Not needed   Testing/Procedures: Not needed   Follow-Up: At [facility], you and your health needs are our priority.  As part of our continuing mission to provide you with exceptional heart care, we have created designated Provider Care Teams.  These Care Teams include your primary Cardiologist (physician) and Advanced Practice Providers (APPs -  Physician Assistants and Nurse Practitioners) who all work together to provide you with the care you need, when you need it.  We recommend signing up for the patient portal called "MyChart".  Sign up information is provided on this After Visit Summary.  MyChart is used to connect with patients for Virtual Visits (Telemedicine).  Patients are able to view lab/test results, encounter notes, upcoming appointments, etc.  Non-urgent messages can be sent to your provider as well.   To learn more about what you can do with MyChart, go to [email or web address].    Your next appointment:   12 month(s)  The format for your next appointment:   In Person  Provider:   Glenetta Hew, MD   Other Instructions  KEEP HYDRATED AT LEAST 80 oz a day

## 2020-03-28 ENCOUNTER — Encounter: Payer: Self-pay | Admitting: Cardiology

## 2020-03-28 NOTE — Assessment & Plan Note (Signed)
No further unstable angina.  This is reassuring consistently recent circumflex was not amenable to PCI options given the tortuosity of the vessel.  Doing well on combination of Imdur plus metoprolol.  Has not required calcium channel blocker and/or Ranexa.

## 2020-03-28 NOTE — Assessment & Plan Note (Signed)
Needs to keep using.  Continue to follow with pulmonary medicine.

## 2020-03-28 NOTE — Assessment & Plan Note (Addendum)
Blood pressure looks good pretty much at goal for her 130/70-continue low-dose metoprolol plus HCTZ at low-dose. No longer having any edema.  She just had to do the HCTZ and not requiring any Lasix.

## 2020-03-28 NOTE — Assessment & Plan Note (Signed)
No further anginal symptoms on medical management now.  Tolerating low-dose beta-blocker along with Imdur.  Plan:   Continue current dose of beta-blocker and Imdur.  -> There is room to potentially titrate up beta-blocker if necessary, but at this point we can hold off  Continue statin  On aspirin (okay to hold for procedures and surgeries.-5 days)

## 2020-03-28 NOTE — Assessment & Plan Note (Signed)
Would be nice for her to lose weight.  We discussed different options.

## 2020-03-28 NOTE — Assessment & Plan Note (Signed)
Most recent labs from a year ago showed excellent lipid control.  For now continue current dose of rosuvastatin.  Labs are being followed by PCP.  Made a dramatic difference being on rosuvastatin versus pravastatin.

## 2020-04-05 ENCOUNTER — Other Ambulatory Visit: Payer: Self-pay

## 2020-04-05 ENCOUNTER — Ambulatory Visit (INDEPENDENT_AMBULATORY_CARE_PROVIDER_SITE_OTHER): Payer: Medicare Other | Admitting: Family Medicine

## 2020-04-05 VITALS — BP 140/98 | HR 74 | Temp 97.2°F | Ht 61.0 in | Wt 170.0 lb

## 2020-04-05 DIAGNOSIS — E119 Type 2 diabetes mellitus without complications: Secondary | ICD-10-CM | POA: Diagnosis not present

## 2020-04-05 DIAGNOSIS — E7800 Pure hypercholesterolemia, unspecified: Secondary | ICD-10-CM | POA: Diagnosis not present

## 2020-04-05 DIAGNOSIS — Z780 Asymptomatic menopausal state: Secondary | ICD-10-CM

## 2020-04-05 DIAGNOSIS — E038 Other specified hypothyroidism: Secondary | ICD-10-CM

## 2020-04-05 DIAGNOSIS — Z0001 Encounter for general adult medical examination with abnormal findings: Secondary | ICD-10-CM

## 2020-04-05 DIAGNOSIS — I25119 Atherosclerotic heart disease of native coronary artery with unspecified angina pectoris: Secondary | ICD-10-CM

## 2020-04-05 DIAGNOSIS — Z0000 Encounter for general adult medical examination without abnormal findings: Secondary | ICD-10-CM

## 2020-04-05 MED ORDER — HYDROCODONE-ACETAMINOPHEN 5-325 MG PO TABS: 1.0000 | ORAL_TABLET | Freq: Four times a day (QID) | ORAL | 0 refills | Status: DC | PRN

## 2020-04-05 NOTE — Progress Notes (Signed)
Subjective:    Patient ID: April [name], female    DOB: 25-Sep-1940, 80 y.o.   MRN: [id number]  HPI Patient is a very pleasant 80 year old Caucasian female here today for complete physical exam.  Past medical history significant for coronary artery disease.  She has a lesion in the circumflex artery this not amenable to stenting.  She denies any chest pain or angina at the present time.  She also has a history of type 2 diabetes mellitus.  She denies any polyuria, polydipsia, or blurry vision.  She denies any vision changes.  She sees an eye doctor regularly.  Diabetic foot exam was performed today and is significant only for diminished sensation to 10 g monofilament bilaterally.  She also has a history of subclinical hypothyroidism.  Her last colonoscopy was performed less than 10 years ago and is not due again.  Her mammogram was done in December and was normal.  She is due for a bone density test.  Due to her age she does not require a Pap smear.  Immunizations are up-to-date except for the booster on the Covid vaccine which I strongly encouraged her to get today.  She denies any falls, depression, or memory loss  Past Medical History:  Diagnosis Date  . Abnormal weight gain   . Arthritis    "joints" (06/13/2013)  . COPD (chronic obstructive pulmonary disease) ([place])   . Coronary artery disease, non-occlusive 06/2013   Cardiac Cath: sharp Angle take-off of Large Dominant Cx: ~70-80% mid Cx bifurcation lesion (Lateral OM &  AVGCx-PL-PDA both with hairpin ostial & ~50% lesions) - Not PCI amenable due to vessel tortuosity; ~40-50% mid LAD;Ramus - no significnat diseaes; small non-dominant RCA  . Diverticulosis   . GERD (gastroesophageal reflux disease)   . History of stress test    a. 09/2006 nl Dobutamine Echo  . Hyperlipidemia   . Hypertension   . NIDDM (non-insulin dependent diabetes mellitus)   . On home oxygen therapy    "2L q hs; runs into my BIPAP" (06/13/2013)  . OSA (obstructive sleep  apnea)    "BIPAP w/O2" (06/13/2013)  . Tracheobronchomalacia    a. followed by [facility]   Past Surgical History:  Procedure Laterality Date  . CARDIAC CATHETERIZATION  06/2013   Cardiac Cath: sharp Angle take-off of Large Dominant Cx: ~70-80% mid Cx bifurcation lesion (Lateral OM &  AVGCx-PL-PDA both with hairpin ostial & ~50% lesions) - Not PCI amenable due to vessel tortuosity; ~40-50% mid LAD;Ramus - no significnat diseaes; small non-dominant RCA  . LEFT HEART CATHETERIZATION WITH CORONARY ANGIOGRAM N/A 06/16/2013   Procedure: LEFT HEART CATHETERIZATION WITH CORONARY ANGIOGRAM;  Surgeon: [name], MD;  Location: [facility] CATH LAB;  Service: Cardiovascular;  Laterality: N/A;  . TRANSTHORACIC ECHOCARDIOGRAM  06/17/2013   Normal LV size and function. EF 55-60% with no regional WMA. Grade 1 diastolic dysfunction. No significant valvular lesions  . TUBAL LIGATION     Current Outpatient Medications on File Prior to Visit  Medication Sig Dispense Refill  . aspirin 81 MG tablet Take 81 mg by mouth daily.    . Calcium Carbonate-Vitamin D (CALCIUM 600 + D PO) Take 1 tablet by mouth daily.    Marland Kitchen gabapentin (NEURONTIN) 600 MG tablet TAKE 1 TABLET BY MOUTH 4 TIMES DAILY 360 tablet 1  . glucose blood test strip 1 each by Other route as needed. Use as instructed 100 each 1  . hydrochlorothiazide (MICROZIDE) 12.5 MG capsule TAKE 1 CAPSULE BY  MOUTH ONCE DAILY 90 capsule 3  . isosorbide mononitrate (IMDUR) 60 MG 24 hr tablet TAKE 1 TABLET BY MOUTH ONCE DAILY 90 tablet 2  . LANTUS SOLOSTAR 100 UNIT/ML Solostar Pen INJECT 40-50 UNITS UNDER THE SKIN ONCE EVERY NIGHT AT BEDTIME. 15 mL 3  . metFORMIN (GLUCOPHAGE) 500 MG tablet TAKE 1 TABLET BY MOUTH TWICE (2) DAILY WITH A MEAL 180 tablet 3  . metoprolol tartrate (LOPRESSOR) 25 MG tablet TAKE 1 TABLET BY MOUTH TWICE A DAY 180 tablet 1  . Multiple Vitamin (MULTIVITAMIN) capsule Take 1 capsule by mouth daily.    . nitroGLYCERIN (NITROSTAT) 0.4 MG SL tablet Place  1 tablet (0.4 mg total) under the tongue every 5 (five) minutes x 3 doses as needed for chest pain. 25 tablet 4  . Omega-3 Fatty Acids (FISH OIL) 1000 MG CAPS Take 1 capsule by mouth daily.    Marland Kitchen omeprazole (PRILOSEC) 20 MG capsule Take 20 mg by mouth daily.    . rosuvastatin (CRESTOR) 40 MG tablet TAKE ONE TABLET BY MOUTH ONCE DAILY 90 tablet 2   No current facility-administered medications on file prior to visit.   Allergies  Allergen Reactions  . Neomycin Other (See Comments)    Visual disturbance and swelling in eyes   Social History   Socioeconomic History  . Marital status: Married    Spouse name: Not on file  . Number of children: 3  . Years of education: Not on file  . Highest education level: Not on file  Occupational History  . Occupation: Systems analyst  Tobacco Use  . Smoking status: Former Smoker    Packs/day: 1.50    Years: 20.00    Pack years: 30.00    Types: Cigarettes    Quit date: 02/13/1986    Years since quitting: 34.1  . Smokeless tobacco: Never Used  Substance and Sexual Activity  . Alcohol use: No  . Drug use: No  . Sexual activity: Yes  Other Topics Concern  . Not on file  Social History Narrative   Lives in [place] with her husband. 3 children.  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   Former 30 Pk/yr Smoker (1.5 PPD x 20 yr) - quit in 1998.   Social Determinants of Health   Financial Resource Strain: Not on file  Food Insecurity: Not on file  Transportation Needs: Not on file  Physical Activity: Not on file  Stress: Not on file  Social Connections: Not on file  Intimate Partner Violence: Not on file      Review of Systems  Musculoskeletal: Positive for back pain.  All other systems reviewed and are negative.      Objective:   Physical Exam Constitutional:      General: She is not in acute distress.    Appearance: Normal appearance. She is obese. She is not ill-appearing, toxic-appearing or diaphoretic.  HENT:     Head: Normocephalic  and atraumatic.     Right Ear: Tympanic membrane, ear canal and external ear normal. There is no impacted cerumen.     Left Ear: Tympanic membrane, ear canal and external ear normal. There is no impacted cerumen.     Nose: Nose normal. No congestion or rhinorrhea.     Mouth/Throat:     Mouth: Mucous membranes are moist.     Pharynx: Oropharynx is clear. No oropharyngeal exudate or posterior oropharyngeal erythema.  Eyes:     General: No scleral icterus.       Right eye: No discharge.  Left eye: No discharge.     Extraocular Movements: Extraocular movements intact.     Conjunctiva/sclera: Conjunctivae normal.     Pupils: Pupils are equal, round, and reactive to light.  Neck:     Vascular: No carotid bruit.  Cardiovascular:     Rate and Rhythm: Normal rate and regular rhythm.     Pulses: Normal pulses.     Heart sounds: Murmur heard.  No friction rub. No gallop.   Pulmonary:     Effort: Pulmonary effort is normal. No respiratory distress.     Breath sounds: Normal breath sounds. No stridor. No wheezing, rhonchi or rales.  Chest:     Chest wall: No tenderness.  Abdominal:     General: Abdomen is flat. Bowel sounds are normal. There is no distension.     Palpations: Abdomen is soft.     Tenderness: There is no abdominal tenderness. There is no right CVA tenderness, left CVA tenderness, guarding or rebound.     Hernia: No hernia is present.  Musculoskeletal:     Cervical back: Normal range of motion and neck supple. No rigidity.     Right lower leg: No edema.     Left lower leg: No edema.  Lymphadenopathy:     Cervical: No cervical adenopathy.  Skin:    Findings: No bruising, erythema, lesion or rash.  Neurological:     General: No focal deficit present.     Mental Status: She is alert and oriented to person, place, and time. Mental status is at baseline.     Cranial Nerves: No cranial nerve deficit.     Sensory: No sensory deficit.     Motor: No weakness.      Coordination: Coordination normal.     Gait: Gait normal.     Deep Tendon Reflexes: Reflexes normal.  Psychiatric:        Mood and Affect: Mood normal.        Behavior: Behavior normal.        Thought Content: Thought content normal.        Judgment: Judgment normal.           Assessment & Plan:  Type 2 diabetes mellitus treated without insulin (HCC) - Plan: Lipid panel, COMPLETE METABOLIC PANEL WITH GFR, Hemoglobin A1c, Microalbumin, urine  Subclinical hypothyroidism - Plan: TSH  Pure hypercholesterolemia  General medical exam  Atherosclerosis of native coronary artery of native heart with angina pectoris ([place])  Postmenopausal estrogen deficiency - Plan: DG Bone Density  I have asked the patient to return fasting so that I can check a CBC, CMP, fasting lipid panel, hemoglobin A1c, urine microalbumin.  I will check a TSH to monitor her subclinical hypothyroidism.  Her goal A1c is less than 7.  Goal LDL cholesterol is less than 70.  Schedule the patient for a bone density test.  Mammogram is up-to-date.  She does not require another colonoscopy or Pap smear.  She denies any falls or depression or memory loss.  She does complain of severe back pain and gabapentin 600 mg 3 times a day is not sufficient so I did give the patient a prescription for Norco 5/325 1 p.o. every 6 hours as needed pain.  I gave her 30 tablets to be used sparingly just for severe pain.  Hopefully the 30 tablets will last 3 to 6 months.  I cautioned the patient about dizziness, falls, and constipation.  Diastolic blood pressure is elevated today.  Of asked the patient to  check it every day for 1 week and report the values to me.  If greater than 140/90 she will need additional medication to lower cardiovascular risk.

## 2020-04-06 ENCOUNTER — Other Ambulatory Visit: Payer: Medicare Other

## 2020-04-06 DIAGNOSIS — E119 Type 2 diabetes mellitus without complications: Secondary | ICD-10-CM | POA: Diagnosis not present

## 2020-04-06 DIAGNOSIS — E038 Other specified hypothyroidism: Secondary | ICD-10-CM | POA: Diagnosis not present

## 2020-04-07 LAB — LIPID PANEL
Cholesterol: 122 mg/dL (ref <200)
HDL: 50 mg/dL (ref >=50)
LDL Cholesterol (Calc): 44 mg/dL (calc)
Non-HDL Cholesterol (Calc): 72 mg/dL (calc) (ref <130)
Total CHOL/HDL Ratio: 2.4 (calc) (ref <5.0)
Triglycerides: 227 mg/dL — ABNORMAL HIGH (ref <150)

## 2020-04-07 LAB — COMPLETE METABOLIC PANEL WITH GFR
AG Ratio: 1.5 (calc) (ref 1.0–2.5)
ALT: 59 U/L — ABNORMAL HIGH (ref 6–29)
AST: 35 U/L (ref 10–35)
Albumin: 3.9 g/dL (ref 3.6–5.1)
Alkaline phosphatase (APISO): 43 U/L (ref 37–153)
BUN: 22 mg/dL (ref 7–25)
CO2: 38 mmol/L — ABNORMAL HIGH (ref 20–32)
Calcium: 10.6 mg/dL — ABNORMAL HIGH (ref 8.6–10.4)
Chloride: 99 mmol/L (ref 98–110)
Creat: 0.65 mg/dL (ref 0.60–0.93)
GFR, Est African American: 99 mL/min/{1.73_m2} (ref >=60)
GFR, Est Non African American: 85 mL/min/{1.73_m2} (ref >=60)
Globulin: 2.6 g/dL (calc) (ref 1.9–3.7)
Glucose, Bld: 121 mg/dL — ABNORMAL HIGH (ref 65–99)
Potassium: 4.8 mmol/L (ref 3.5–5.3)
Sodium: 142 mmol/L (ref 135–146)
Total Bilirubin: 0.5 mg/dL (ref 0.2–1.2)
Total Protein: 6.5 g/dL (ref 6.1–8.1)

## 2020-04-07 LAB — MICROALBUMIN, URINE: Microalb, Ur: 9.4 mg/dL

## 2020-04-07 LAB — TSH: TSH: 3.08 mIU/L (ref 0.40–4.50)

## 2020-04-07 LAB — HEMOGLOBIN A1C
Hgb A1c MFr Bld: 7.3 % of total Hgb — ABNORMAL HIGH (ref <5.7)
Mean Plasma Glucose: 163 mg/dL
eAG (mmol/L): 9 mmol/L

## 2020-04-08 ENCOUNTER — Encounter: Payer: Self-pay | Admitting: *Deleted

## 2020-04-08 IMAGING — CT CT LUMBAR SPINE WITH CONTRAST
1 of 6 series · 6 of 14 positions shown, 8 images · non-contrast
Comparison: None

CLINICAL DATA: Low back pain. Suspected spinal stenosis without
neurogenic claudication.
TECHNIQUE: Contiguous axial images were obtained through the Lumbar spine after
the intrathecal infusion of infusion. Coronal and sagittal
reconstructions were obtained of the axial image sets.

[Series 3: l spine soft · axial · 0.32mm/px · z∈[-219,-54]mm · 6 of 77 slices shown, 8 images]
[im 11/77  soft-tissue]
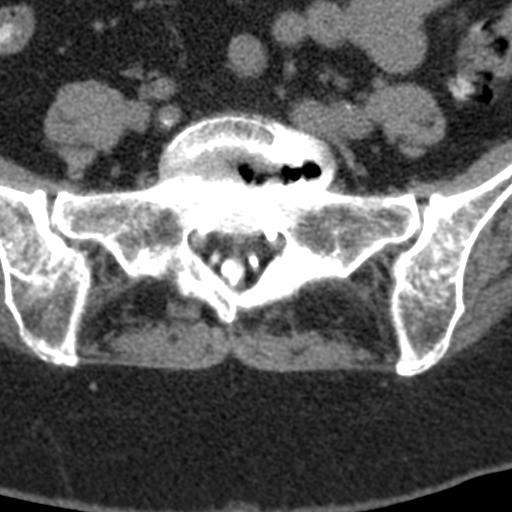
[im 11/77  bone]
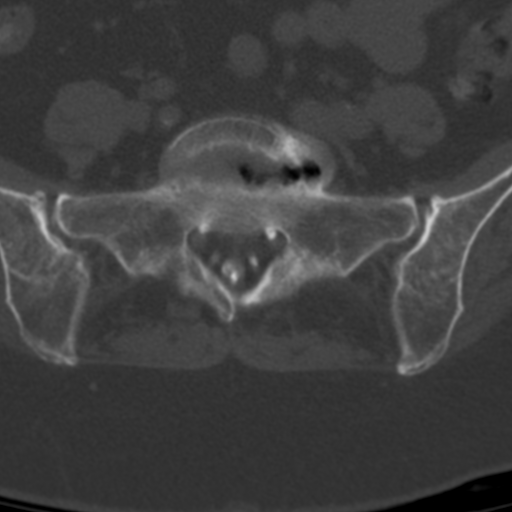
[im 22/77  bone]
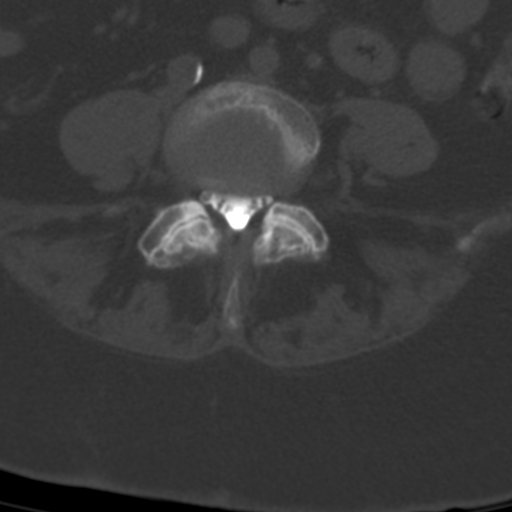
[im 33/77  bone]
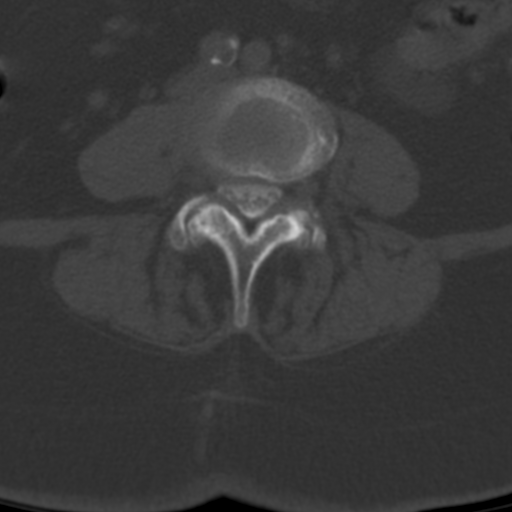
[im 44/77  bone]
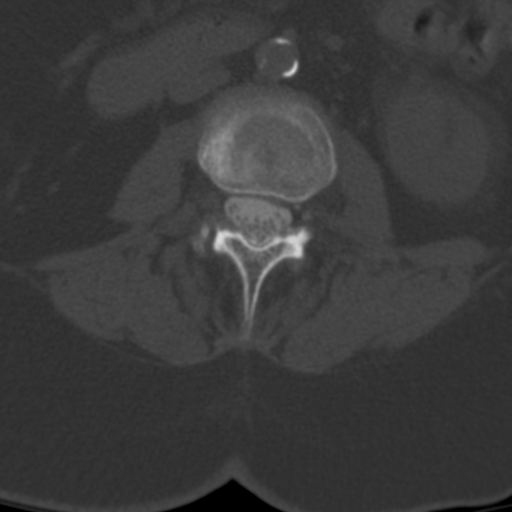
[im 55/77  soft-tissue]
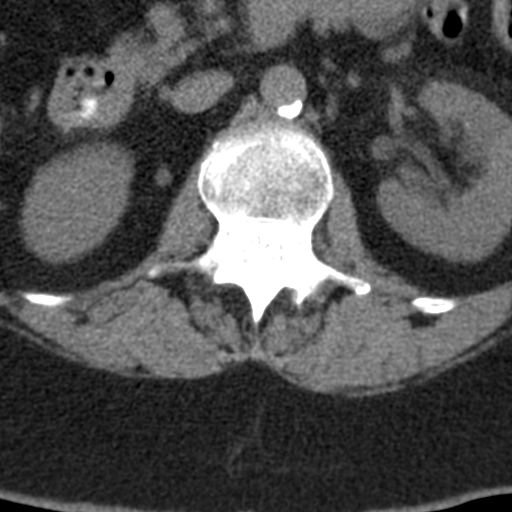
[im 55/77  bone]
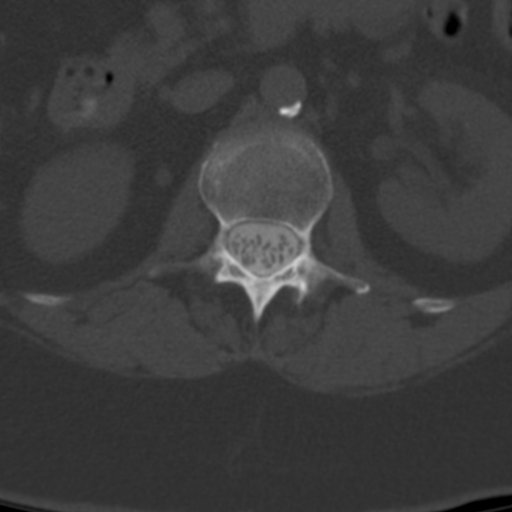
[im 66/77  bone]
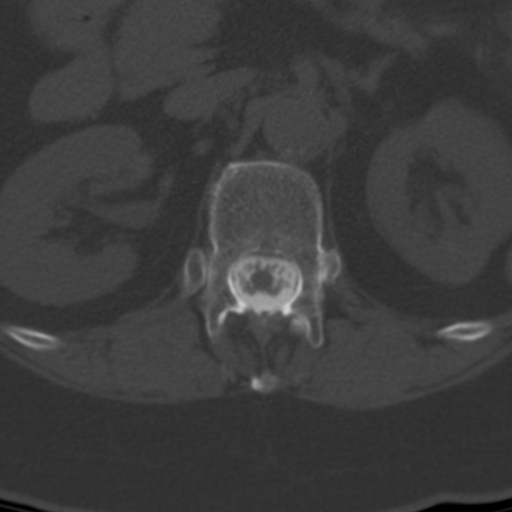

[6 of 14 positions shown; findings below may reference images not displayed]

EXAM:
LUMBAR MYELOGRAM

FLUOROSCOPY TIME:  29 seconds corresponding to a Dose Area Product
of 282 Gy*m2

PROCEDURE:
After thorough discussion of risks and benefits of the procedure
including bleeding, infection, injury to nerves, blood vessels,
adjacent structures as well as headache and CSF leak, written and
oral informed consent was obtained. Consent was obtained by Dr. Giforiyo
Yung. Time out form was completed.

Patient was positioned prone on the fluoroscopy table. Local
anesthesia was provided with 1% lidocaine without epinephrine after
prepped and draped in the usual sterile fashion. Puncture was
performed at L3-L4 using a 3 1/2 inch 22-gauge spinal needle via
midline approach. Using a single pass through the dura, the needle
was placed within the thecal sac, with return of clear CSF. 15 mL of
Isovue K-FII was injected into the thecal sac, with normal
opacification of the nerve roots and cauda equina consistent with
free flow within the subarachnoid space.

I personally performed the lumbar puncture and administered the
intrathecal contrast. I also personally supervised acquisition of
the myelogram images.
FINDINGS: LUMBAR MYELOGRAM FINDINGS:

Good opacification lumbar subarachnoid space. Degenerative scoliosis
convex LEFT, centered L2, roughly 10 degrees. No significant spinal
stenosis or nerve root cut off.

With patient standing, there is 2 mm anterolisthesis L4-5. This
increases to 3 mm in flexion.

Ligamentum flavum infolding with patient standing contributes to
mild stenosis at L3-4.

CT LUMBAR MYELOGRAM FINDINGS:

Segmentation: Normal.

Alignment:  Normal.

Vertebrae: No worrisome osseous lesion.

Conus medullaris: Normal in size and location.

Paraspinal tissues: No evidence for hydronephrosis or paravertebral
mass. Aortic atherosclerosis.

Disc levels:

L1-L2:  Unremarkable.

L2-L3: Trace retrolisthesis. Minimal inferior endplate spurring.
Unremarkable interspace.

L3-L4: Posterior element hypertrophy. Trace anterolisthesis.
Ligamentum flavum infolding with facet overgrowth contributes to
mild to moderate stenosis. The annular bulge. No definite
subarticular zone or foraminal zone narrowing.

L4-L5: Annular bulge. 1 mm anterolisthesis, facet mediated. Facet
overgrowth with mild ligamentum flavum hypertrophy. No compressive
subarticular zone or foraminal zone narrowing.

L5-S1: Disc space narrowing. Facet arthropathy annular bulge. No
impingement.
IMPRESSION: LUMBAR MYELOGRAM IMPRESSION:

Mild dynamic instability at L4-5, up to 3 mm anterolisthesis in
standing flexion.

Ligamentum flavum infolding contributes to mild stenosis at L3-4,
more pronounced with patient upright.

CT LUMBAR MYELOGRAM IMPRESSION:

Posterior element hypertrophy at L3-4 contributes to mild to
moderate stenosis. No subarticular zone or foraminal zone narrowing.

Annular bulge L4-5, also with posterior element hypertrophy, but no
significant impingement.

## 2020-04-20 ENCOUNTER — Other Ambulatory Visit: Payer: Self-pay | Admitting: Family Medicine

## 2020-04-22 DIAGNOSIS — M419 Scoliosis, unspecified: Secondary | ICD-10-CM | POA: Diagnosis not present

## 2020-04-22 DIAGNOSIS — R030 Elevated blood-pressure reading, without diagnosis of hypertension: Secondary | ICD-10-CM | POA: Diagnosis not present

## 2020-04-22 DIAGNOSIS — M5416 Radiculopathy, lumbar region: Secondary | ICD-10-CM | POA: Diagnosis not present

## 2020-04-22 DIAGNOSIS — Z6832 Body mass index (BMI) 32.0-32.9, adult: Secondary | ICD-10-CM | POA: Diagnosis not present

## 2020-05-07 ENCOUNTER — Other Ambulatory Visit: Payer: Self-pay | Admitting: Family Medicine

## 2020-05-19 DIAGNOSIS — M545 Low back pain, unspecified: Secondary | ICD-10-CM | POA: Diagnosis not present

## 2020-05-19 DIAGNOSIS — M5416 Radiculopathy, lumbar region: Secondary | ICD-10-CM | POA: Diagnosis not present

## 2020-06-02 ENCOUNTER — Other Ambulatory Visit: Payer: Self-pay | Admitting: Family Medicine

## 2020-06-03 DIAGNOSIS — M5416 Radiculopathy, lumbar region: Secondary | ICD-10-CM | POA: Diagnosis not present

## 2020-06-03 NOTE — Telephone Encounter (Signed)
Ok to refill??  Last office visit/ refill 04/05/2020.

## 2020-06-16 DIAGNOSIS — M25552 Pain in left hip: Secondary | ICD-10-CM | POA: Diagnosis not present

## 2020-06-16 DIAGNOSIS — M545 Low back pain, unspecified: Secondary | ICD-10-CM | POA: Diagnosis not present

## 2020-06-17 ENCOUNTER — Other Ambulatory Visit: Payer: Self-pay | Admitting: Orthopedic Surgery

## 2020-06-17 ENCOUNTER — Other Ambulatory Visit: Payer: Self-pay | Admitting: Neurological Surgery

## 2020-06-17 DIAGNOSIS — M25552 Pain in left hip: Secondary | ICD-10-CM

## 2020-06-17 DIAGNOSIS — M5416 Radiculopathy, lumbar region: Secondary | ICD-10-CM

## 2020-06-21 ENCOUNTER — Ambulatory Visit
Admission: RE | Admit: 2020-06-21 | Discharge: 2020-06-21 | Disposition: A | Discharge status: Home / Self Care | Payer: Medicare Other | Source: Ambulatory Visit | Attending: Orthopedic Surgery | Admitting: Orthopedic Surgery

## 2020-06-21 ENCOUNTER — Other Ambulatory Visit: Payer: Self-pay

## 2020-06-21 DIAGNOSIS — M1612 Unilateral primary osteoarthritis, left hip: Secondary | ICD-10-CM | POA: Diagnosis not present

## 2020-06-21 DIAGNOSIS — M6258 Muscle wasting and atrophy, not elsewhere classified, other site: Secondary | ICD-10-CM | POA: Diagnosis not present

## 2020-06-21 DIAGNOSIS — M25552 Pain in left hip: Secondary | ICD-10-CM

## 2020-06-23 DIAGNOSIS — M25552 Pain in left hip: Secondary | ICD-10-CM | POA: Diagnosis not present

## 2020-07-08 DIAGNOSIS — M5416 Radiculopathy, lumbar region: Secondary | ICD-10-CM | POA: Diagnosis not present

## 2020-07-08 NOTE — Progress Notes (Signed)
Subjective:   April [name] is a 80 y.o. female who presents for Medicare Annual (Subsequent) preventive examination.  Review of Systems    N/A  Cardiac Risk Factors include: advanced age (>6men, >75 women);hypertension;diabetes mellitus;dyslipidemia     Objective:    Today's Vitals   07/09/20 0947  BP: 120/60  Temp: 98.8 F (37.1 C)  TempSrc: Oral  Weight: 162 lb 8 oz (73.7 kg)  Height: 5\' 1"  (1.549 m)   Body mass index is 30.7 kg/m.  Advanced Directives 07/09/2020 02/03/2014 01/29/2014 06/13/2013  Does Patient Have a Medical Advance Directive? No No No Patient does not have advance directive;Patient would like information  Would patient like information on creating a medical advance directive? No - Patient declined - No - patient declined information Advance directive packet given  Pre-existing out of facility DNR order (yellow form or pink MOST form) - - - No    Current Medications (verified) Outpatient Encounter Medications as of 07/09/2020  Medication Sig  . Calcium Carbonate-Vitamin D (CALCIUM 600 + D PO) Take 1 tablet by mouth daily.  Marland Kitchen gabapentin (NEURONTIN) 600 MG tablet TAKE 1 TABLET BY MOUTH 4 TIMES DAILY  . glucose blood test strip 1 each by Other route as needed. Use as instructed  . hydrochlorothiazide (MICROZIDE) 12.5 MG capsule TAKE 1 CAPSULE BY MOUTH ONCE DAILY  . HYDROcodone-acetaminophen (NORCO/VICODIN) 5-325 MG tablet TAKE 1 TABLET BY MOUTH EVERY 6 HOURS AS NEEDED FOR MODERATE PAIN. (Patient not taking: Reported on 07/09/2020)  . isosorbide mononitrate (IMDUR) 60 MG 24 hr tablet TAKE 1 TABLET BY MOUTH ONCE DAILY  . LANTUS SOLOSTAR 100 UNIT/ML Solostar Pen INJECT 40-50 UNITS UNDER THE SKIN ONCE EVERY NIGHT AT BEDTIME.  . metFORMIN (GLUCOPHAGE) 500 MG tablet TAKE 1 TABLET BY MOUTH TWICE (2) DAILY WITH A MEAL  . metoprolol tartrate (LOPRESSOR) 25 MG tablet TAKE 1 TABLET BY MOUTH TWICE A DAY  . Multiple Vitamin (MULTIVITAMIN) capsule Take 1 capsule by mouth  daily.  . Omega-3 Fatty Acids (FISH OIL) 1000 MG CAPS Take 1 capsule by mouth daily.  Marland Kitchen omeprazole (PRILOSEC) 20 MG capsule Take 20 mg by mouth daily.  . rosuvastatin (CRESTOR) 40 MG tablet TAKE ONE TABLET BY MOUTH ONCE DAILY  . nitroGLYCERIN (NITROSTAT) 0.4 MG SL tablet Place 1 tablet (0.4 mg total) under the tongue every 5 (five) minutes x 3 doses as needed for chest pain. (Patient not taking: Reported on 07/09/2020)  . [DISCONTINUED] aspirin 81 MG tablet Take 81 mg by mouth daily.   No facility-administered encounter medications on file as of 07/09/2020.    Allergies (verified) Neomycin   History: Past Medical History:  Diagnosis Date  . Abnormal weight gain   . Arthritis    "joints" (06/13/2013)  . COPD (chronic obstructive pulmonary disease) ([place])   . Coronary artery disease, non-occlusive 06/2013   Cardiac Cath: sharp Angle take-off of Large Dominant Cx: ~70-80% mid Cx bifurcation lesion (Lateral OM &  AVGCx-PL-PDA both with hairpin ostial & ~50% lesions) - Not PCI amenable due to vessel tortuosity; ~40-50% mid LAD;Ramus - no significnat diseaes; small non-dominant RCA  . Diverticulosis   . GERD (gastroesophageal reflux disease)   . History of stress test    a. 09/2006 nl Dobutamine Echo  . Hyperlipidemia   . Hypertension   . NIDDM (non-insulin dependent diabetes mellitus)   . On home oxygen therapy    "2L q hs; runs into my BIPAP" (06/13/2013)  . OSA (obstructive sleep apnea)    "  BIPAP w/O2" (06/13/2013)  . Tracheobronchomalacia    a. followed by [facility]   Past Surgical History:  Procedure Laterality Date  . CARDIAC CATHETERIZATION  06/2013   Cardiac Cath: sharp Angle take-off of Large Dominant Cx: ~70-80% mid Cx bifurcation lesion (Lateral OM &  AVGCx-PL-PDA both with hairpin ostial & ~50% lesions) - Not PCI amenable due to vessel tortuosity; ~40-50% mid LAD;Ramus - no significnat diseaes; small non-dominant RCA  . LEFT HEART CATHETERIZATION WITH CORONARY ANGIOGRAM N/A  06/16/2013   Procedure: LEFT HEART CATHETERIZATION WITH CORONARY ANGIOGRAM;  Surgeon: [name], MD;  Location: [facility] CATH LAB;  Service: Cardiovascular;  Laterality: N/A;  . TRANSTHORACIC ECHOCARDIOGRAM  06/17/2013   Normal LV size and function. EF 55-60% with no regional WMA. Grade 1 diastolic dysfunction. No significant valvular lesions  . TUBAL LIGATION     Family History  Problem Relation Age of Onset  . Asthma Maternal Grandmother   . Heart disease Maternal Grandmother   . Heart disease Mother        developed coronary dzs in her 68's.  . Clotting disorder Mother   . Heart disease Maternal Grandfather   . Diabetes Brother   . Diabetes Paternal Aunt   . CAD Brother        s/p cabg in his 54's  . CAD Brother        s/p cabg in his 95's.   Social History   Socioeconomic History  . Marital status: Married    Spouse name: Not on file  . Number of children: 3  . Years of education: Not on file  . Highest education level: Not on file  Occupational History  . Occupation: Systems analyst  Tobacco Use  . Smoking status: Former Smoker    Packs/day: 1.50    Years: 20.00    Pack years: 30.00    Types: Cigarettes    Quit date: 02/13/1986    Years since quitting: 34.4  . Smokeless tobacco: Never Used  Substance and Sexual Activity  . Alcohol use: No  . Drug use: No  . Sexual activity: Yes  Other Topics Concern  . Not on file  Social History Narrative   Lives in [place] with her husband. 3 children.  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   Former 30 Pk/yr Smoker (1.5 PPD x 20 yr) - quit in 1998.   Social Determinants of Health   Financial Resource Strain: Low Risk   . Difficulty of Paying Living Expenses: Not hard at all  Food Insecurity: No Food Insecurity  . Worried About Charity fundraiser in the Last Year: Never true  . Ran Out of Food in the Last Year: Never true  Transportation Needs: No Transportation Needs  . Lack of Transportation (Medical): No  . Lack of  Transportation (Non-Medical): No  Physical Activity: Inactive  . Days of Exercise per Week: 0 days  . Minutes of Exercise per Session: 0 min  Stress: No Stress Concern Present  . Feeling of Stress : Not at all  Social Connections: Moderately Integrated  . Frequency of Communication with Friends and Family: More than three times a week  . Frequency of Social Gatherings with Friends and Family: More than three times a week  . Attends Religious Services: More than 4 times per year  . Active Member of Clubs or Organizations: No  . Attends Archivist Meetings: Never  . Marital Status: Married    Tobacco Counseling Counseling given: Not Answered  Clinical Intake:  Pre-visit preparation completed: Yes  Pain : No/denies pain     Nutritional Risks: None Diabetes: Yes CBG done?: No Did pt. bring in CBG monitor from home?: No  How often do you need to have someone help you when you read instructions, pamphlets, or other written materials from your doctor or pharmacy?: 1 - Never  Diabetic?Yes Nutrition Risk Assessment:  Has the patient had any N/V/D within the last 2 months?  No  Does the patient have any non-healing wounds?  No  Has the patient had any unintentional weight loss or weight gain?  No   Diabetes:  Is the patient diabetic?  Yes  If diabetic, was a CBG obtained today?  No  Did the patient bring in their glucometer from home?  No  How often do you monitor your CBG's? Patient states checks her glucose once per day .   Financial Strains and Diabetes Management:  Are you having any financial strains with the device, your supplies or your medication? No .  Does the patient want to be seen by Chronic Care Management for management of their diabetes?  No  Would the patient like to be referred to a Nutritionist or for Diabetic Management?  No   Diabetic Exams:  Diabetic Eye Exam: Completed 03/08/2020 Diabetic Foot Exam: Completed 04/05/2020   Interpreter  Needed?: No  Information entered by :: [place] of Daily Living In your present state of health, do you have any difficulty performing the following activities: 07/09/2020 04/05/2020  Hearing? [place]? N N  Difficulty concentrating or making decisions? N N  Walking or climbing stairs? N N  Dressing or bathing? N N  Doing errands, shopping? N N  Preparing Food and eating ? N -  Using the Toilet? N -  In the past six months, have you accidently leaked urine? Y -  Comment Patient has occassional leakage with coughing -  Do you have problems with loss of bowel control? N -  Managing your Medications? N -  Managing your Finances? N -  Housekeeping or managing your Housekeeping? N -  Some recent data might be hidden    Patient Care Team: Susy Frizzle, MD as PCP - General (Family Medicine) [name], MD as PCP - Cardiology (Cardiology) Susy Frizzle, MD (Family Medicine)  Indicate any recent Medical Services you may have received from other than [facility] providers in the past year (date may be approximate).     Assessment:   This is a routine wellness examination for April [name].  Hearing/Vision screen  Hearing Screening   125Hz  250Hz  500Hz  1000Hz  2000Hz  3000Hz  4000Hz  6000Hz  8000Hz   Right ear:           Left ear:           Vision Screening Comments: Patient states gets eyes examined once per year. Has hx of cataract surgery   Dietary issues and exercise activities discussed: Current Exercise Habits: The patient does not participate in regular exercise at present, Exercise limited by: None identified  Goals Addressed            This Visit's Progress   . DIET - INCREASE WATER INTAKE      . Exercise 3x per week (30 min per time)        Depression Screen PHQ 2/9 Scores 07/09/2020 04/05/2020 12/10/2017 06/07/2017 12/15/2016 09/29/2016 09/29/2016  PHQ - 2 Score 0 0 0 0 0 0 0    Fall  Risk Fall Risk  07/09/2020 12/10/2017 06/07/2017 12/15/2016 09/29/2016   Falls in the past year? 1 No No Yes Yes  Number falls in past yr: 0 - - 1 1  Injury with Fall? 1 - - No Yes  Risk Factor Category  - - - - High Fall Risk  Risk for fall due to : No Fall Risks - - - -  Follow up Falls evaluation completed;Falls prevention discussed - - Falls evaluation completed -    FALL RISK PREVENTION PERTAINING TO THE HOME:  Any stairs in or around the home? No  If so, are there any without handrails? No  Home free of loose throw rugs in walkways, pet beds, electrical cords, etc? Yes  Adequate lighting in your home to reduce risk of falls? Yes   ASSISTIVE DEVICES UTILIZED TO PREVENT FALLS:  Life alert? No  Use of a cane, walker or w/c? No  Grab bars in the bathroom? No  Shower chair or bench in shower? No  Elevated toilet seat or a handicapped toilet? Yes   TIMED UP AND GO:  Was the test performed? Yes .  Length of time to ambulate 10 feet: 3 sec.   Gait steady and fast without use of assistive device  Cognitive Function:   Normal cognitive status assessed by direct observation by this Nurse Health Advisor. No abnormalities found.     6CIT Screen 04/05/2020  What Year? 0 points  What month? 0 points  What time? 0 points  Count back from 20 0 points  Months in reverse 0 points  Repeat phrase 0 points  Total Score 0    Immunizations Immunization History  Administered Date(s) Administered  . Fluad Quad(high Dose 65+) 11/11/2018, 11/20/2019  . Influenza Nasal 11/14/2011  . Influenza Split 11/02/2015  . Influenza Whole 11/26/2006, 11/27/2008, 11/16/2009  . Influenza,inj,Quad PF,6+ Mos 12/11/2012, 12/22/2013, 11/03/2014, 11/23/2014, 12/10/2017  . PFIZER(Purple Top)SARS-COV-2 Vaccination 04/17/2019, 05/13/2019  . Pneumococcal Conjugate-13 01/21/2013  . Pneumococcal Polysaccharide-23 03/19/2006, 12/22/2013    TDAP status: Due, Education has been provided regarding the importance of this vaccine. Advised may receive this vaccine at local pharmacy  or Health Dept. Aware to provide a copy of the vaccination record if obtained from local pharmacy or Health Dept. Verbalized acceptance and understanding.  Flu Vaccine status: Up to date  Pneumococcal vaccine status: Up to date  Covid-19 vaccine status: Completed vaccines  Qualifies for Shingles Vaccine? Yes   Zostavax completed No   Shingrix Completed?: No.    Education has been provided regarding the importance of this vaccine. Patient has been advised to call insurance company to determine out of pocket expense if they have not yet received this vaccine. Advised may also receive vaccine at local pharmacy or Health Dept. Verbalized acceptance and understanding.  Screening Tests Health Maintenance  Topic Date Due  . Zoster Vaccines- Shingrix (1 of 2) Never done  . COVID-19 Vaccine (3 - Booster for Pfizer series) 10/13/2019  . TETANUS/TDAP  04/05/2021 (Originally 01/21/1960)  . INFLUENZA VACCINE  09/13/2020  . HEMOGLOBIN A1C  10/04/2020  . OPHTHALMOLOGY EXAM  03/08/2021  . FOOT EXAM  04/05/2021  . URINE MICROALBUMIN  04/06/2021  . DEXA SCAN  Completed  . Hepatitis C Screening  Completed  . PNA vac Low Risk Adult  Completed  . HPV VACCINES  Aged Out    Health Maintenance  Health Maintenance Due  Topic Date Due  . Zoster Vaccines- Shingrix (1 of 2) Never done  . COVID-19 Vaccine (  3 - Booster for Pfizer series) 10/13/2019    Colorectal cancer screening: No longer required.   Mammogram status: Completed 02/03/2020. Repeat every year  Bone Density status: Ordered 04/05/2020. Pt provided with contact info and advised to call to schedule appt.  Lung Cancer Screening: (Low Dose CT Chest recommended if Age 2-80 years, 30 pack-year currently smoking OR have quit w/in 15years.) does not qualify.   Lung Cancer Screening Referral: N/A   Additional Screening:  Hepatitis C Screening: does qualify; Completed 04/21/2013  Vision Screening: Recommended annual ophthalmology exams for  early detection of glaucoma and other disorders of the eye. Is the patient up to date with their annual eye exam?  Yes  Who is the provider or what is the name of the office in which the patient attends annual eye exams? Dr. Katy Fitch  If pt is not established with a provider, would they like to be referred to a provider to establish care? No .   Dental Screening: Recommended annual dental exams for proper oral hygiene  Community Resource Referral / Chronic Care Management: CRR required this visit?  No   CCM required this visit?  No      Plan:     I have personally reviewed and noted the following in the patient's chart:   . Medical and social history . Use of alcohol, tobacco or illicit drugs  . Current medications and supplements including opioid prescriptions.  . Functional ability and status . Nutritional status . Physical activity . Advanced directives . List of other physicians . Hospitalizations, surgeries, and ER visits in previous 12 months . Vitals . Screenings to include cognitive, depression, and falls . Referrals and appointments  In addition, I have reviewed and discussed with patient certain preventive protocols, quality metrics, and best practice recommendations. A written personalized care plan for preventive services as well as general preventive health recommendations were provided to patient.     Ofilia Neas, LPN   [id number]   Nurse Notes: None

## 2020-07-09 ENCOUNTER — Other Ambulatory Visit: Payer: Self-pay

## 2020-07-09 ENCOUNTER — Ambulatory Visit (INDEPENDENT_AMBULATORY_CARE_PROVIDER_SITE_OTHER): Payer: Medicare Other

## 2020-07-09 VITALS — BP 120/60 | Temp 98.8°F | Ht 61.0 in | Wt 162.5 lb

## 2020-07-09 DIAGNOSIS — Z0000 Encounter for general adult medical examination without abnormal findings: Secondary | ICD-10-CM | POA: Diagnosis not present

## 2020-07-09 NOTE — Patient Instructions (Signed)
[name] , Thank you for taking time to come for your Medicare Wellness Visit. I appreciate your ongoing commitment to your health goals. Please review the following plan we discussed and let me know if I can assist you in the future.   Screening recommendations/referrals: Colonoscopy: No longer required  Mammogram: Up to date, next due 02/02/2021 Bone Density: Currently due, orders were placed on 04/05/2020 please call and get Bone Density scheduled  Recommended yearly ophthalmology/optometry visit for glaucoma screening and checkup Recommended yearly dental visit for hygiene and checkup  Vaccinations: Influenza vaccine: Up to date, next due fall 2022  Pneumococcal vaccine: Completed series  Tdap vaccine: Currently due, you may await and injury  Shingles vaccine: Currently due for Shingrix, if you would like to receive we recommend that you do so at your local pharmacy    Advanced directives: Advance directive discussed with you today. Even though you declined this today please call our office should you change your mind and we can give you the proper paperwork for you to fill out.   Conditions/risks identified: None   Next appointment: None    Preventive Care 65 Years and Older, Female Preventive care refers to lifestyle choices and visits with your health care provider that can promote health and wellness. What does preventive care include?  A yearly physical exam. This is also called an annual well check.  Dental exams once or twice a year.  Routine eye exams. Ask your health care provider how often you should have your eyes checked.  Personal lifestyle choices, including:  Daily care of your teeth and gums.  Regular physical activity.  Eating a healthy diet.  Avoiding tobacco and drug use.  Limiting alcohol use.  Practicing safe sex.  Taking low-dose aspirin every day.  Taking vitamin and mineral supplements as recommended by your health care provider. What  happens during an annual well check? The services and screenings done by your health care provider during your annual well check will depend on your age, overall health, lifestyle risk factors, and family history of disease. Counseling  Your health care provider may ask you questions about your:  Alcohol use.  Tobacco use.  Drug use.  Emotional well-being.  Home and relationship well-being.  Sexual activity.  Eating habits.  History of falls.  Memory and ability to understand (cognition).  Work and work Statistician.  Reproductive health. Screening  You may have the following tests or measurements:  Height, weight, and BMI.  Blood pressure.  Lipid and cholesterol levels. These may be checked every 5 years, or more frequently if you are over 58 years old.  Skin check.  Lung cancer screening. You may have this screening every year starting at age 18 if you have a 30-pack-year history of smoking and currently smoke or have quit within the past 15 years.  Fecal occult blood test (FOBT) of the stool. You may have this test every year starting at age 105.  Flexible sigmoidoscopy or colonoscopy. You may have a sigmoidoscopy every 5 years or a colonoscopy every 10 years starting at age 62.  Hepatitis C blood test.  Hepatitis B blood test.  Sexually transmitted disease (STD) testing.  Diabetes screening. This is done by checking your blood sugar (glucose) after you have not eaten for a while (fasting). You may have this done every 1-3 years.  Bone density scan. This is done to screen for osteoporosis. You may have this done starting at age 20.  Mammogram. This may be  done every 1-2 years. Talk to your health care provider about how often you should have regular mammograms. Talk with your health care provider about your test results, treatment options, and if necessary, the need for more tests. Vaccines  Your health care provider may recommend certain vaccines, such  as:  Influenza vaccine. This is recommended every year.  Tetanus, diphtheria, and acellular pertussis (Tdap, Td) vaccine. You may need a Td booster every 10 years.  Zoster vaccine. You may need this after age 37.  Pneumococcal 13-valent conjugate (PCV13) vaccine. One dose is recommended after age 9.  Pneumococcal polysaccharide (PPSV23) vaccine. One dose is recommended after age 60. Talk to your health care provider about which screenings and vaccines you need and how often you need them. This information is not intended to replace advice given to you by your health care provider. Make sure you discuss any questions you have with your health care provider. Document Released: 02/26/2015 Document Revised: 10/20/2015 Document Reviewed: 12/01/2014 Elsevier Interactive Patient Education  2017 [place] Prevention in the Home Falls can cause injuries. They can happen to people of all ages. There are many things you can do to make your home safe and to help prevent falls. What can I do on the outside of my home?  Regularly fix the edges of walkways and driveways and fix any cracks.  Remove anything that might make you trip as you walk through a door, such as a raised step or threshold.  Trim any bushes or trees on the path to your home.  Use bright outdoor lighting.  Clear any walking paths of anything that might make someone trip, such as rocks or tools.  Regularly check to see if handrails are loose or broken. Make sure that both sides of any steps have handrails.  Any raised decks and porches should have guardrails on the edges.  Have any leaves, snow, or ice cleared regularly.  Use sand or salt on walking paths during winter.  Clean up any spills in your garage right away. This includes oil or grease spills. What can I do in the bathroom?  Use night lights.  Install grab bars by the toilet and in the tub and shower. Do not use towel bars as grab bars.  Use  non-skid mats or decals in the tub or shower.  If you need to sit down in the shower, use a plastic, non-slip stool.  Keep the floor dry. Clean up any water that spills on the floor as soon as it happens.  Remove soap buildup in the tub or shower regularly.  Attach bath mats securely with double-sided non-slip rug tape.  Do not have throw rugs and other things on the floor that can make you trip. What can I do in the bedroom?  Use night lights.  Make sure that you have a light by your bed that is easy to reach.  Do not use any sheets or blankets that are too big for your bed. They should not hang down onto the floor.  Have a firm chair that has side arms. You can use this for support while you get dressed.  Do not have throw rugs and other things on the floor that can make you trip. What can I do in the kitchen?  Clean up any spills right away.  Avoid walking on wet floors.  Keep items that you use a lot in easy-to-reach places.  If you need to reach something above you, use  a strong step stool that has a grab bar.  Keep electrical cords out of the way.  Do not use floor polish or wax that makes floors slippery. If you must use wax, use non-skid floor wax.  Do not have throw rugs and other things on the floor that can make you trip. What can I do with my stairs?  Do not leave any items on the stairs.  Make sure that there are handrails on both sides of the stairs and use them. Fix handrails that are broken or loose. Make sure that handrails are as long as the stairways.  Check any carpeting to make sure that it is firmly attached to the stairs. Fix any carpet that is loose or worn.  Avoid having throw rugs at the top or bottom of the stairs. If you do have throw rugs, attach them to the floor with carpet tape.  Make sure that you have a light switch at the top of the stairs and the bottom of the stairs. If you do not have them, ask someone to add them for you. What  else can I do to help prevent falls?  Wear shoes that:  Do not have high heels.  Have rubber bottoms.  Are comfortable and fit you well.  Are closed at the toe. Do not wear sandals.  If you use a stepladder:  Make sure that it is fully opened. Do not climb a closed stepladder.  Make sure that both sides of the stepladder are locked into place.  Ask someone to hold it for you, if possible.  Clearly mark and make sure that you can see:  Any grab bars or handrails.  First and last steps.  Where the edge of each step is.  Use tools that help you move around (mobility aids) if they are needed. These include:  Canes.  Walkers.  Scooters.  Crutches.  Turn on the lights when you go into a dark area. Replace any light bulbs as soon as they burn out.  Set up your furniture so you have a clear path. Avoid moving your furniture around.  If any of your floors are uneven, fix them.  If there are any pets around you, be aware of where they are.  Review your medicines with your doctor. Some medicines can make you feel dizzy. This can increase your chance of falling. Ask your doctor what other things that you can do to help prevent falls. This information is not intended to replace advice given to you by your health care provider. Make sure you discuss any questions you have with your health care provider. Document Released: 11/26/2008 Document Revised: 07/08/2015 Document Reviewed: 03/06/2014 Elsevier Interactive Patient Education  2017 [employer].

## 2020-07-21 DIAGNOSIS — M25552 Pain in left hip: Secondary | ICD-10-CM | POA: Diagnosis not present

## 2020-07-22 ENCOUNTER — Telehealth: Payer: Self-pay | Admitting: Cardiology

## 2020-07-22 MED ORDER — METOPROLOL TARTRATE 25 MG PO TABS: 25.0000 mg | ORAL_TABLET | Freq: Two times a day (BID) | ORAL | 3 refills | Status: DC

## 2020-07-22 NOTE — Telephone Encounter (Signed)
*  STAT* If patient is at the pharmacy, call can be transferred to refill team.   1. Which medications need to be refilled? (please list name of each medication and dose if known) metoprolol tartrate (LOPRESSOR) 25 MG tablet  2. Which pharmacy/location (including street and city if local pharmacy) is medication to be sent to? [place], [place]  3. Do they need a 30 day or 90 day supply? 90 day supply   Pt is completely out of medication. Pharmacy states they have been attempting

## 2020-07-28 ENCOUNTER — Other Ambulatory Visit: Payer: Self-pay | Admitting: Family Medicine

## 2020-07-28 ENCOUNTER — Telehealth: Payer: Self-pay

## 2020-07-28 NOTE — Telephone Encounter (Signed)
Form completed and routed to provider for signature.

## 2020-08-04 DIAGNOSIS — M47816 Spondylosis without myelopathy or radiculopathy, lumbar region: Secondary | ICD-10-CM | POA: Diagnosis not present

## 2020-08-04 DIAGNOSIS — M419 Scoliosis, unspecified: Secondary | ICD-10-CM | POA: Insufficient documentation

## 2020-08-20 ENCOUNTER — Other Ambulatory Visit: Payer: Self-pay | Admitting: Family Medicine

## 2020-08-25 ENCOUNTER — Other Ambulatory Visit: Payer: Self-pay | Admitting: Family Medicine

## 2020-08-25 ENCOUNTER — Other Ambulatory Visit: Payer: Self-pay | Admitting: Cardiology

## 2020-09-02 DIAGNOSIS — G4733 Obstructive sleep apnea (adult) (pediatric): Secondary | ICD-10-CM | POA: Diagnosis not present

## 2020-09-06 DIAGNOSIS — M47816 Spondylosis without myelopathy or radiculopathy, lumbar region: Secondary | ICD-10-CM | POA: Diagnosis not present

## 2020-09-09 ENCOUNTER — Other Ambulatory Visit: Payer: Self-pay

## 2020-09-09 ENCOUNTER — Ambulatory Visit
Admission: RE | Admit: 2020-09-09 | Discharge: 2020-09-09 | Disposition: A | Discharge status: Home / Self Care | Payer: Medicare Other | Source: Ambulatory Visit | Attending: Family Medicine | Admitting: Family Medicine

## 2020-09-09 ENCOUNTER — Other Ambulatory Visit: Payer: Medicare Other

## 2020-09-09 DIAGNOSIS — M8589 Other specified disorders of bone density and structure, multiple sites: Secondary | ICD-10-CM | POA: Diagnosis not present

## 2020-09-09 DIAGNOSIS — Z780 Asymptomatic menopausal state: Secondary | ICD-10-CM | POA: Diagnosis not present

## 2020-09-13 ENCOUNTER — Encounter: Payer: Self-pay | Admitting: *Deleted

## 2020-09-13 ENCOUNTER — Other Ambulatory Visit: Payer: Self-pay | Admitting: *Deleted

## 2020-09-13 MED ORDER — ONETOUCH VERIO VI STRP: ORAL_STRIP | 1 refills | Status: DC

## 2020-09-28 DIAGNOSIS — M47816 Spondylosis without myelopathy or radiculopathy, lumbar region: Secondary | ICD-10-CM | POA: Diagnosis not present

## 2020-09-28 DIAGNOSIS — M419 Scoliosis, unspecified: Secondary | ICD-10-CM | POA: Diagnosis not present

## 2020-11-04 ENCOUNTER — Ambulatory Visit: Payer: Medicare Other | Admitting: Pulmonary Disease

## 2020-11-04 ENCOUNTER — Encounter: Payer: Self-pay | Admitting: Pulmonary Disease

## 2020-11-04 ENCOUNTER — Other Ambulatory Visit: Payer: Self-pay

## 2020-11-04 VITALS — BP 104/54 | HR 68 | Temp 97.7°F | Ht 60.0 in | Wt 159.6 lb

## 2020-11-04 DIAGNOSIS — R600 Localized edema: Secondary | ICD-10-CM

## 2020-11-04 DIAGNOSIS — G4733 Obstructive sleep apnea (adult) (pediatric): Secondary | ICD-10-CM

## 2020-11-04 DIAGNOSIS — J398 Other specified diseases of upper respiratory tract: Secondary | ICD-10-CM

## 2020-11-04 DIAGNOSIS — M7989 Other specified soft tissue disorders: Secondary | ICD-10-CM

## 2020-11-04 DIAGNOSIS — Z23 Encounter for immunization: Secondary | ICD-10-CM | POA: Diagnosis not present

## 2020-11-04 MED ORDER — POTASSIUM CHLORIDE CRYS ER 10 MEQ PO TBCR: 10.0000 meq | EXTENDED_RELEASE_TABLET | Freq: Two times a day (BID) | ORAL | 0 refills | Status: DC

## 2020-11-04 MED ORDER — FUROSEMIDE 20 MG PO TABS: 20.0000 mg | ORAL_TABLET | Freq: Every day | ORAL | 0 refills | Status: DC

## 2020-11-04 NOTE — Assessment & Plan Note (Signed)
Download was reviewed which shows excellent compliance more than 8 hours on BiPAP 10/5 with good control of events she is clearly very compliant over the years and biPAP is certainly helped improve her daytime somnolence and fatigue

## 2020-11-04 NOTE — Assessment & Plan Note (Signed)
New onset over the last few weeks.  No history of orthopnea or paroxysmal nocturnal dyspnea or chest pain. Previous echo in 2015 did not show any evidence of pulm hypertension, will repeat. Will trial Lasix 20 mg daily for 2 weeks with potassium supplementation. She can stop thiazide while she is on this.  She will report back if no improvement or worsening symptoms

## 2020-11-04 NOTE — Progress Notes (Signed)
   Subjective:    Patient ID: April [name], female    DOB: October 14, 1940, 80 y.o.   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    She obtained a new BiPAP machine in 2018 No problems with mask or pressure.  She is accompanied by her daughter today who corroborates history. She reports dyspnea on exertion but is also limited by back pain. She reported bipedal edema and a bruise on her left ankle. She is maintained on thiazide for hypertension, meds were reviewed  Chest x-ray 11/2018 was reviewed which did not show any infiltrates or effusions   Significant tests/ events reviewed  08/2012 sniff test negative   HRCT 03/2007 & 05/2008 did not show any evidence of pulmonary fibrosis . Large hiatal hernia was noted.    PFT initially showed intraparenchymal restriction with a diffuse capacity of 49%. PFTs 09/2007 >> marked improvment, FEV1% was 78, FEV68 %, FVC 63%, TLC 67%, DLCO remains 51%.  PSG  did reveal mild obstructive sleep apnea. On BiPAP +10/5 & is able to sleep supine.  Echo- no rt--> LT shunt    Feb, 2012 - Hoarseness resolved - lisinopril stopped, neg ENt evaluation [name])  06/17/2010 -ONO on bipap/ RA desaturation for about 6 mins -    08/22/2012 spirometry >> FVC 75% , mild restriction - improved slightly  Past Medical History:  Diagnosis Date   Abnormal weight gain    Arthritis    "joints" (06/13/2013)   COPD (chronic obstructive pulmonary disease) (HCC)    Coronary artery disease, non-occlusive 06/2013   Cardiac Cath: sharp Angle take-off of Large Dominant Cx: ~70-80% mid Cx bifurcation lesion (Lateral OM &  AVGCx-PL-PDA both with hairpin ostial & ~50% lesions) - Not PCI amenable due to vessel tortuosity; ~40-50% mid LAD;Ramus - no significnat diseaes; small non-dominant RCA   Diverticulosis    GERD (gastroesophageal reflux disease)    History of stress test    a. 09/2006 nl Dobutamine Echo    Hyperlipidemia    Hypertension    NIDDM (non-insulin dependent diabetes mellitus)    On home oxygen therapy    "2L q hs; runs into my BIPAP" (06/13/2013)   OSA (obstructive sleep apnea)    "BIPAP w/O2" (06/13/2013)   Tracheobronchomalacia    a. followed by [facility].     Review of Systems neg for any significant sore throat, dysphagia, itching, sneezing, nasal congestion or excess/ purulent secretions, fever, chills, sweats, unintended wt loss, pleuritic or exertional cp, hempoptysis, orthopnea pnd or change in chronic leg swelling. Also denies presyncope, palpitations, heartburn, abdominal pain, nausea, vomiting, diarrhea or change in bowel or urinary habits, dysuria,hematuria, rash, arthralgias, visual complaints, headache, numbness weakness or ataxia.     Objective:   Physical Exam   Gen. Pleasant, obese, in no distress ENT - no lesions, no post nasal drip Neck: No JVD, no thyromegaly, no carotid bruits Lungs: no use of accessory muscles, no dullness to percussion, decreased without rales or rhonchi  Cardiovascular: Rhythm regular, heart sounds  normal, no murmurs or gallops, 2+ peripheral edema Musculoskeletal: No deformities, no cyanosis or clubbing , no tremors        Assessment & Plan:

## 2020-11-04 NOTE — Assessment & Plan Note (Signed)
She seems to be very deconditioned and would benefit from a pulmonary rehab program, hoping to tracheobronchomalacia and restrictive lung disease can be used as qualifying diagnosis

## 2020-11-04 NOTE — Patient Instructions (Signed)
  Rehab referral at [facility] for tracheomalacia and restrictive lung disease.  For leg swelling, decrease salt intake, keep leg elevated. Trial of Lasix 20 mg daily for 2 weeks. Potassium chloride 10 mEq daily for 2 weeks.  While taking these medications, STO P taking hydrochlorothiazide  If leg swelling persist, call us or your PCP for further testing

## 2020-11-09 DIAGNOSIS — M5416 Radiculopathy, lumbar region: Secondary | ICD-10-CM | POA: Diagnosis not present

## 2020-11-11 ENCOUNTER — Ambulatory Visit (INDEPENDENT_AMBULATORY_CARE_PROVIDER_SITE_OTHER): Payer: Medicare Other | Admitting: Family Medicine

## 2020-11-11 ENCOUNTER — Other Ambulatory Visit: Payer: Self-pay

## 2020-11-11 VITALS — BP 112/64 | HR 54 | Temp 97.0°F | Ht 60.0 in | Wt 162.0 lb

## 2020-11-11 DIAGNOSIS — M7989 Other specified soft tissue disorders: Secondary | ICD-10-CM | POA: Diagnosis not present

## 2020-11-11 DIAGNOSIS — J398 Other specified diseases of upper respiratory tract: Secondary | ICD-10-CM

## 2020-11-11 DIAGNOSIS — I25119 Atherosclerotic heart disease of native coronary artery with unspecified angina pectoris: Secondary | ICD-10-CM | POA: Diagnosis not present

## 2020-11-11 DIAGNOSIS — R600 Localized edema: Secondary | ICD-10-CM | POA: Diagnosis not present

## 2020-11-11 MED ORDER — TORSEMIDE 20 MG PO TABS: 20.0000 mg | ORAL_TABLET | Freq: Every day | ORAL | 5 refills | Status: DC

## 2020-11-11 NOTE — Progress Notes (Signed)
Subjective:    Patient ID: April [name], female    DOB: [birth year], 80 y.o.   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  Recently developed +2 pitting edema in both feet and ankles and lower legs.  She also reports increasing shortness of breath with activity although she denies any angina or chest pain.  She denies any orthopnea or paroxysmal nocturnal dyspnea.  The edema stops at her knees.  She recently saw her pulmonologist who ordered an echocardiogram, discontinued hydrochlorothiazide and put her on furosemide.  However she was only able to tolerate furosemide for 2 days due to nausea and she stopped the furosemide.  She is here today for follow-up. Past Medical History:  Diagnosis Date   Abnormal weight gain    Arthritis    "joints" (06/13/2013)   COPD (chronic obstructive pulmonary disease) (HCC)    Coronary artery disease, non-occlusive 06/2013   Cardiac Cath: sharp Angle take-off of Large Dominant Cx: ~70-80% mid Cx bifurcation lesion (Lateral OM &  AVGCx-PL-PDA both with hairpin ostial & ~50% lesions) - Not PCI amenable due to vessel tortuosity; ~40-50% mid LAD;Ramus - no significnat diseaes; small non-dominant RCA   Diverticulosis    GERD (gastroesophageal reflux disease)    History of stress test    a. 09/2006 nl Dobutamine Echo   Hyperlipidemia    Hypertension    NIDDM (non-insulin dependent diabetes mellitus)    On home oxygen therapy    "2L q hs; runs into my BIPAP" (06/13/2013)   OSA (obstructive sleep apnea)    "BIPAP w/O2" (06/13/2013)   Tracheobronchomalacia    a. followed by [facility].   Past Surgical History:  Procedure Laterality Date   CARDIAC CATHETERIZATION  06/2013   Cardiac Cath: sharp Angle take-off of Large Dominant Cx: ~70-80% mid Cx bifurcation lesion (Lateral OM &  AVGCx-PL-PDA both with  hairpin ostial & ~50% lesions) - Not PCI amenable due to vessel tortuosity; ~40-50% mid LAD;Ramus - no significnat diseaes; small non-dominant RCA   LEFT HEART CATHETERIZATION WITH CORONARY ANGIOGRAM N/A 06/16/2013   Procedure: LEFT HEART CATHETERIZATION WITH CORONARY ANGIOGRAM;  Surgeon: [name], MD;  Location: [facility] CATH LAB;  Service: Cardiovascular;  Laterality: N/A;   TRANSTHORACIC ECHOCARDIOGRAM  06/17/2013   Normal LV size and function. EF 55-60% with no regional WMA. Grade 1 diastolic dysfunction. No significant valvular lesions   TUBAL LIGATION     Current Outpatient Medications on File Prior to Visit  Medication Sig Dispense Refill   furosemide (LASIX) 20 MG tablet Take 1 tablet (20 mg total) by mouth daily. 14 tablet 0   Calcium Carbonate-Vitamin D (CALCIUM 600 + D PO) Take 1 tablet by mouth daily.     gabapentin (NEURONTIN) 600 MG tablet TAKE 1 TABLET BY MOUTH 4 TIMES DAILY 360 tablet 1   glucose blood (ONETOUCH VERIO) test strip Use as directed to monitor FSBS up to 3x daily as needed. Dx: E11.9. 100 each 1   hydrochlorothiazide (MICROZIDE) 12.5 MG capsule TAKE 1 CAPSULE BY MOUTH ONCE DAILY 90 capsule 3   HYDROcodone-acetaminophen (NORCO/VICODIN) 5-325 MG tablet TAKE 1 TABLET BY MOUTH EVERY 6 HOURS AS NEEDED FOR MODERATE PAIN. 30 tablet 0   isosorbide mononitrate (IMDUR) 60 MG 24 hr tablet TAKE 1 TABLET BY MOUTH ONCE DAILY 90 tablet 2   LANTUS SOLOSTAR 100 UNIT/ML Solostar Pen INJECT  40-50 UNITS UNDER THE SKIN ONCE EVERY NIGHT AT BEDTIME. 15 mL 3   metFORMIN (GLUCOPHAGE) 500 MG tablet TAKE 1 TABLET BY MOUTH TWICE (2) DAILY WITH A MEAL 180 tablet 3   metoprolol tartrate (LOPRESSOR) 25 MG tablet Take 1 tablet (25 mg total) by mouth 2 (two) times daily. 180 tablet 3   Multiple Vitamin (MULTIVITAMIN) capsule Take 1 capsule by mouth daily.     nitroGLYCERIN (NITROSTAT) 0.4 MG SL tablet Place 1 tablet (0.4 mg total) under the tongue every 5 (five) minutes x 3 doses as needed for chest  pain. 25 tablet 4   Omega-3 Fatty Acids (FISH OIL) 1000 MG CAPS Take 1 capsule by mouth daily.     omeprazole (PRILOSEC) 20 MG capsule Take 20 mg by mouth daily.     potassium chloride (KLOR-CON) 10 MEQ tablet Take 1 tablet (10 mEq total) by mouth 2 (two) times daily. 14 tablet 0   rosuvastatin (CRESTOR) 40 MG tablet TAKE ONE TABLET BY MOUTH ONCE DAILY 90 tablet 2   No current facility-administered medications on file prior to visit.   Allergies  Allergen Reactions   Neomycin Other (See Comments)    Visual disturbance and swelling in eyes   Social History   Socioeconomic History   Marital status: Married    Spouse name: Not on file   Number of children: 3   Years of education: Not on file   Highest education level: Not on file  Occupational History   Occupation: Systems analyst  Tobacco Use   Smoking status: Former    Packs/day: 1.50    Years: 20.00    Pack years: 30.00    Types: Cigarettes    Quit date: 02/13/1986    Years since quitting: 34.7   Smokeless tobacco: Never  Substance and Sexual Activity   Alcohol use: No   Drug use: No   Sexual activity: Yes  Other Topics Concern   Not on file  Social History Narrative   Lives in [place] with her husband. 3 children.  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   Former 30 Pk/yr Smoker (1.5 PPD x 20 yr) - quit in 1998.   Social Determinants of Health   Financial Resource Strain: Low Risk    Difficulty of Paying Living Expenses: Not hard at all  Food Insecurity: No Food Insecurity   Worried About Charity fundraiser in the Last Year: Never true   [place] in the Last Year: Never true  Transportation Needs: No Transportation Needs   Lack of Transportation (Medical): No   Lack of Transportation (Non-Medical): No  Physical Activity: Inactive   Days of Exercise per Week: 0 days   Minutes of Exercise per Session: 0 min  Stress: No Stress Concern Present   Feeling of Stress : Not at all  Social Connections: Moderately  Integrated   Frequency of Communication with Friends and Family: More than three times a week   Frequency of Social Gatherings with Friends and Family: More than three times a week   Attends Religious Services: More than 4 times per year   Active Member of [employer] or Organizations: No   Attends Archivist Meetings: Never   Marital Status: Married  Human resources officer Violence: Not At Risk   Fear of Current or Ex-Partner: No   Emotionally Abused: No   Physically Abused: No   Sexually Abused: No      Review of Systems  Musculoskeletal:  Positive for back pain.  All other systems reviewed and are negative.     Objective:   Physical Exam Constitutional:      General: She is not in acute distress.    Appearance: Normal appearance. She is obese. She is not ill-appearing, toxic-appearing or diaphoretic.  HENT:     Head: Normocephalic and atraumatic.     Right Ear: Tympanic membrane, ear canal and external ear normal. There is no impacted cerumen.     Left Ear: Tympanic membrane, ear canal and external ear normal. There is no impacted cerumen.     Nose: Nose normal. No congestion or rhinorrhea.     Mouth/Throat:     Mouth: Mucous membranes are moist.     Pharynx: Oropharynx is clear. No oropharyngeal exudate or posterior oropharyngeal erythema.  Eyes:     General: No scleral icterus.       Right eye: No discharge.        Left eye: No discharge.     Extraocular Movements: Extraocular movements intact.     Conjunctiva/sclera: Conjunctivae normal.     Pupils: Pupils are equal, round, and reactive to light.  Neck:     Vascular: No carotid bruit.  Cardiovascular:     Rate and Rhythm: Normal rate and regular rhythm.     Pulses: Normal pulses.     Heart sounds: Murmur heard.    No friction rub. No gallop.  Pulmonary:     Effort: Pulmonary effort is normal. No respiratory distress.     Breath sounds: Normal breath sounds. No stridor. No wheezing, rhonchi or rales.  Chest:      Chest wall: No tenderness.  Abdominal:     General: Abdomen is flat. Bowel sounds are normal. There is no distension.     Palpations: Abdomen is soft.     Tenderness: There is no abdominal tenderness. There is no right CVA tenderness, left CVA tenderness, guarding or rebound.     Hernia: No hernia is present.  Musculoskeletal:     Cervical back: Normal range of motion and neck supple. No rigidity.     Right lower leg: Edema present.     Left lower leg: Edema present.  Lymphadenopathy:     Cervical: No cervical adenopathy.  Skin:    Findings: No bruising, erythema, lesion or rash.  Neurological:     General: No focal deficit present.     Mental Status: She is alert and oriented to person, place, and time. Mental status is at baseline.     Cranial Nerves: No cranial nerve deficit.     Sensory: No sensory deficit.     Motor: No weakness.     Coordination: Coordination normal.     Gait: Gait normal.     Deep Tendon Reflexes: Reflexes normal.  Psychiatric:        Mood and Affect: Mood normal.        Behavior: Behavior normal.        Thought Content: Thought content normal.        Judgment: Judgment normal.          Assessment & Plan:  Leg swelling - Plan: CBC with Differential/Platelet, COMPLETE METABOLIC PANEL WITH GFR, Brain natriuretic peptide  Atherosclerosis of native coronary artery of native heart with angina pectoris (HCC)  Pedal edema  Tracheobronchomalacia Discontinue furosemide as the patient is unable to tolerate it.  Switch to torsemide 20 mg a day and recheck on Monday.  Supplement with K. Dur 10 mEq daily.  Stay off  hydrochlorothiazide.  Check BNP, CBC, CMP.  Schedule the patient for an urgent echocardiogram to evaluate for congestive heart failure.  Patient would also be high risk for pulmonary hypertension and right-sided heart failure due to her history of tracheobronchomalacia

## 2020-11-12 LAB — COMPLETE METABOLIC PANEL WITH GFR
AG Ratio: 1.6 (calc) (ref 1.0–2.5)
ALT: 27 U/L (ref 6–29)
AST: 23 U/L (ref 10–35)
Albumin: 3.9 g/dL (ref 3.6–5.1)
Alkaline phosphatase (APISO): 44 U/L (ref 37–153)
BUN: 21 mg/dL (ref 7–25)
CO2: 33 mmol/L — ABNORMAL HIGH (ref 20–32)
Calcium: 10.4 mg/dL (ref 8.6–10.4)
Chloride: 101 mmol/L (ref 98–110)
Creat: 0.74 mg/dL (ref 0.60–1.00)
Globulin: 2.4 g/dL (calc) (ref 1.9–3.7)
Glucose, Bld: 136 mg/dL — ABNORMAL HIGH (ref 65–99)
Potassium: 4.7 mmol/L (ref 3.5–5.3)
Sodium: 140 mmol/L (ref 135–146)
Total Bilirubin: 0.4 mg/dL (ref 0.2–1.2)
Total Protein: 6.3 g/dL (ref 6.1–8.1)
eGFR: 83 mL/min/{1.73_m2} (ref >=60)

## 2020-11-12 LAB — CBC WITH DIFFERENTIAL/PLATELET
Absolute Monocytes: 525 cells/uL (ref 200–950)
Basophils Absolute: 9 cells/uL (ref 0–200)
Basophils Relative: 0.1 %
Eosinophils Absolute: 0 cells/uL — ABNORMAL LOW (ref 15–500)
Eosinophils Relative: 0 %
HCT: 46.8 % — ABNORMAL HIGH (ref 35.0–45.0)
Hemoglobin: 15 g/dL (ref 11.7–15.5)
Lymphs Abs: 2245 cells/uL (ref 850–3900)
MCH: 29.5 pg (ref 27.0–33.0)
MCHC: 32.1 g/dL (ref 32.0–36.0)
MCV: 91.9 fL (ref 80.0–100.0)
MPV: 10.8 fL (ref 7.5–12.5)
Monocytes Relative: 6.1 %
Neutro Abs: 5822 cells/uL (ref 1500–7800)
Neutrophils Relative %: 67.7 %
Platelets: 271 10*3/uL (ref 140–400)
RBC: 5.09 10*6/uL (ref 3.80–5.10)
RDW: 13.4 % (ref 11.0–15.0)
Total Lymphocyte: 26.1 %
WBC: 8.6 10*3/uL (ref 3.8–10.8)

## 2020-11-12 LAB — BRAIN NATRIURETIC PEPTIDE: Brain Natriuretic Peptide: 170 pg/mL — ABNORMAL HIGH (ref <100)

## 2020-11-16 ENCOUNTER — Telehealth: Payer: Self-pay | Admitting: *Deleted

## 2020-11-16 NOTE — Telephone Encounter (Signed)
Received call from patient.   States that he edema is improving with Lasix. States that she still has some swelling to BLE, but not as much as previous.   Reports that she is holding torsemide and HCTZ.

## 2020-11-17 NOTE — Telephone Encounter (Signed)
Received call from patient daughter Ebony Hail.   Patient daughter reports that patient was confused as to her mediations.   She is currently taking Torsemide.   She is not taking Lasix or HCTZ.   Reports that edema has improved.   Reports that echo has been scheduled for 11/18/2020.

## 2020-11-18 ENCOUNTER — Other Ambulatory Visit: Payer: Self-pay

## 2020-11-18 ENCOUNTER — Ambulatory Visit (HOSPITAL_COMMUNITY): Payer: Medicare Other | Attending: Cardiology

## 2020-11-18 DIAGNOSIS — J398 Other specified diseases of upper respiratory tract: Secondary | ICD-10-CM | POA: Insufficient documentation

## 2020-11-18 DIAGNOSIS — R0600 Dyspnea, unspecified: Secondary | ICD-10-CM | POA: Diagnosis not present

## 2020-11-18 DIAGNOSIS — M7989 Other specified soft tissue disorders: Secondary | ICD-10-CM | POA: Insufficient documentation

## 2020-11-18 DIAGNOSIS — I25119 Atherosclerotic heart disease of native coronary artery with unspecified angina pectoris: Secondary | ICD-10-CM | POA: Insufficient documentation

## 2020-11-18 DIAGNOSIS — R600 Localized edema: Secondary | ICD-10-CM | POA: Insufficient documentation

## 2020-11-18 LAB — ECHOCARDIOGRAM COMPLETE
AR max vel: 0.86 cm2
AV Area VTI: 0.95 cm2
AV Area mean vel: 0.86 cm2
AV Mean grad: 7 mmHg
AV Peak grad: 14.7 mmHg
Ao pk vel: 1.92 m/s
Area-P 1/2: 5.42 cm2
S' Lateral: 2.3 cm

## 2020-11-26 ENCOUNTER — Ambulatory Visit (INDEPENDENT_AMBULATORY_CARE_PROVIDER_SITE_OTHER): Payer: Medicare Other | Admitting: Family Medicine

## 2020-11-26 ENCOUNTER — Encounter: Payer: Self-pay | Admitting: Family Medicine

## 2020-11-26 ENCOUNTER — Other Ambulatory Visit: Payer: Self-pay

## 2020-11-26 VITALS — BP 104/66 | HR 63 | Temp 97.6°F | Ht 61.0 in | Wt 155.0 lb

## 2020-11-26 DIAGNOSIS — I5189 Other ill-defined heart diseases: Secondary | ICD-10-CM | POA: Diagnosis not present

## 2020-11-26 MED ORDER — POTASSIUM CHLORIDE CRYS ER 10 MEQ PO TBCR: 10.0000 meq | EXTENDED_RELEASE_TABLET | Freq: Two times a day (BID) | ORAL | 4 refills | Status: DC

## 2020-11-26 NOTE — Progress Notes (Signed)
Subjective:    Patient ID: April [name], female    DOB: 1940-04-28, 80 y.o.   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  Recently developed +2 pitting edema in both feet and ankles and lower legs.  She also reports increasing shortness of breath with activity although she denies any angina or chest pain.  She denies any orthopnea or paroxysmal nocturnal dyspnea.  The edema stops at her knees.  She recently saw her pulmonologist who ordered an echocardiogram, discontinued hydrochlorothiazide and put her on furosemide.  However she was only able to tolerate furosemide for 2 days due to nausea and she stopped the furosemide.  She is here today for follow-up. At that time, my plan was:  Discontinue furosemide as the patient is unable to tolerate it.  Switch to torsemide 20 mg a day and recheck on Monday.  Supplement with K. Dur 10 mEq daily.  Stay off hydrochlorothiazide.  Check BNP, CBC, CMP.  Schedule the patient for an urgent echocardiogram to evaluate for congestive heart failure.  Patient would also be high risk for pulmonary hypertension and right-sided heart failure due to her history of tracheobronchomalacia  11/26/20 Lab work showed a BNP elevated at 170.  Echocardiogram showed an ejection fraction of 60 to 65% but grade 1 diastolic dysfunction.  The patient has been taking torsemide 20 mg a day and states that the swelling is better in her legs.  Today she has trace pitting edema up to her mid ankle.  She denies any increasing shortness of breath or chest pain or dyspnea on exertion Past Medical History:  Diagnosis Date   Abnormal weight gain    Arthritis    "joints" (06/13/2013)   COPD (chronic obstructive pulmonary disease) (HCC)    Coronary artery disease, non-occlusive 06/2013   Cardiac Cath: sharp Angle take-off  of Large Dominant Cx: ~70-80% mid Cx bifurcation lesion (Lateral OM &  AVGCx-PL-PDA both with hairpin ostial & ~50% lesions) - Not PCI amenable due to vessel tortuosity; ~40-50% mid LAD;Ramus - no significnat diseaes; small non-dominant RCA   Diverticulosis    GERD (gastroesophageal reflux disease)    History of stress test    a. 09/2006 nl Dobutamine Echo   Hyperlipidemia    Hypertension    NIDDM (non-insulin dependent diabetes mellitus)    On home oxygen therapy    "2L q hs; runs into my BIPAP" (06/13/2013)   OSA (obstructive sleep apnea)    "BIPAP w/O2" (06/13/2013)   Tracheobronchomalacia    a. followed by [facility].   Past Surgical History:  Procedure Laterality Date   CARDIAC CATHETERIZATION  06/2013   Cardiac Cath: sharp Angle take-off of Large Dominant Cx: ~70-80% mid Cx bifurcation lesion (Lateral OM &  AVGCx-PL-PDA both with hairpin ostial & ~50% lesions) - Not PCI amenable due to vessel tortuosity; ~40-50% mid LAD;Ramus - no significnat diseaes; small non-dominant RCA   LEFT HEART CATHETERIZATION WITH CORONARY ANGIOGRAM N/A 06/16/2013   Procedure: LEFT HEART CATHETERIZATION WITH CORONARY ANGIOGRAM;  Surgeon: [name], MD;  Location[facility] C[facility] LAB;  Service: Cardiovascular;  Laterality: N/A;   TRANSTHORACIC ECHOCARDIOGRAM  06/17/2013   Normal LV size and function. EF 55-60% with no regional WMA. Grade 1 diastolic dysfunction. No significant valvular lesions   TUBAL LIGATION     Current Outpatient Medications on File Prior to Visit  Medication Sig Dispense Refill   Calcium Carbonate-Vitamin D (CALCIUM 600 + D PO) Take 1 tablet by mouth daily.     gabapentin (NEURONTIN) 600 MG tablet TAKE 1 TABLET BY MOUTH 4 TIMES DAILY 360 tablet 1   glucose blood (ONETOUCH VERIO) test strip Use as directed to monitor FSBS up to 3x daily as needed. Dx: E11.9. 100 each 1   HYDROcodone-acetaminophen (NORCO/VICODIN) 5-325 MG tablet TAKE 1 TABLET BY MOUTH EVERY 6 HOURS AS NEEDED FOR MODERATE PAIN.  30 tablet 0   isosorbide mononitrate (IMDUR) 60 MG 24 hr tablet TAKE 1 TABLET BY MOUTH ONCE DAILY 90 tablet 2   LANTUS SOLOSTAR 100 UNIT/ML Solostar Pen INJECT 40-50 UNITS UNDER THE SKIN ONCE EVERY NIGHT AT BEDTIME. 15 mL 3   metFORMIN (GLUCOPHAGE) 500 MG tablet TAKE 1 TABLET BY MOUTH TWICE (2) DAILY WITH A MEAL 180 tablet 3   metoprolol tartrate (LOPRESSOR) 25 MG tablet Take 1 tablet (25 mg total) by mouth 2 (two) times daily. 180 tablet 3   Multiple Vitamin (MULTIVITAMIN) capsule Take 1 capsule by mouth daily.     nitroGLYCERIN (NITROSTAT) 0.4 MG SL tablet Place 1 tablet (0.4 mg total) under the tongue every 5 (five) minutes x 3 doses as needed for chest pain. 25 tablet 4   Omega-3 Fatty Acids (FISH OIL) 1000 MG CAPS Take 1 capsule by mouth daily.     omeprazole (PRILOSEC) 20 MG capsule Take 20 mg by mouth daily.     rosuvastatin (CRESTOR) 40 MG tablet TAKE ONE TABLET BY MOUTH ONCE DAILY 90 tablet 2   torsemide (DEMADEX) 20 MG tablet Take 1 tablet (20 mg total) by mouth daily. 30 tablet 5   No current facility-administered medications on file prior to visit.   Allergies  Allergen Reactions   Neomycin Other (See Comments)    Visual disturbance and swelling in eyes   Social History   Socioeconomic History   Marital status: Married    Spouse name: Not on file   Number of children: 3   Years of education: Not on file   Highest education level: Not on file  Occupational History   Occupation: Systems analyst  Tobacco Use   Smoking status: Former    Packs/day: 1.50    Years: 20.00    Pack years: 30.00    Types: Cigarettes    Quit date: 02/13/1986    Years since quitting: 34.8   Smokeless tobacco: Never  Substance and Sexual Activity   Alcohol use: No   Drug use: No   Sexual activity: Yes  Other Topics Concern   Not on file  Social History Narrative   Lives in [place] with her husband. 3 children.  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   Former 30 Pk/yr Smoker (1.5 PPD x 20 yr) - quit  in 1998.   Social Determinants of Health   Financial Resource Strain: Low Risk    Difficulty of Paying Living Expenses: Not hard at all  Food Insecurity: No Food Insecurity   Worried About Charity fundraiser in the Last Year: Never true   [place] in the Last Year: Never true  Transportation Needs: No Transportation Needs   Lack of Transportation (Medical): No   Lack of Transportation (Non-Medical): No  Physical Activity: Inactive   Days of Exercise per Week: 0 days   Minutes of Exercise per Session: 0 min  Stress: No Stress Concern Present   Feeling of Stress : Not at all  Social  Connections: Moderately Integrated   Frequency of Communication with Friends and Family: More than three times a week   Frequency of Social Gatherings with Friends and Family: More than three times a week   Attends Religious Services: More than 4 times per year   Active Member of [employer] or Organizations: No   Attends Archivist Meetings: Never   Marital Status: Married  Human resources officer Violence: Not At Risk   Fear of Current or Ex-Partner: No   Emotionally Abused: No   Physically Abused: No   Sexually Abused: No      Review of Systems  Musculoskeletal:  Positive for back pain.  All other systems reviewed and are negative.     Objective:   Physical Exam Constitutional:      General: She is not in acute distress.    Appearance: Normal appearance. She is obese. She is not ill-appearing, toxic-appearing or diaphoretic.  HENT:     Head: Normocephalic and atraumatic.     Right Ear: Tympanic membrane, ear canal and external ear normal. There is no impacted cerumen.     Left Ear: Tympanic membrane, ear canal and external ear normal. There is no impacted cerumen.     Nose: Nose normal. No congestion or rhinorrhea.     Mouth/Throat:     Mouth: Mucous membranes are moist.     Pharynx: Oropharynx is clear. No oropharyngeal exudate or posterior oropharyngeal erythema.  Eyes:      General: No scleral icterus.       Right eye: No discharge.        Left eye: No discharge.     Extraocular Movements: Extraocular movements intact.     Conjunctiva/sclera: Conjunctivae normal.     Pupils: Pupils are equal, round, and reactive to light.  Neck:     Vascular: No carotid bruit.  Cardiovascular:     Rate and Rhythm: Normal rate and regular rhythm.     Pulses: Normal pulses.     Heart sounds: Murmur heard.    No friction rub. No gallop.  Pulmonary:     Effort: Pulmonary effort is normal. No respiratory distress.     Breath sounds: Normal breath sounds. No stridor. No wheezing, rhonchi or rales.  Chest:     Chest wall: No tenderness.  Abdominal:     General: Abdomen is flat. Bowel sounds are normal. There is no distension.     Palpations: Abdomen is soft.     Tenderness: There is no abdominal tenderness. There is no right CVA tenderness, left CVA tenderness, guarding or rebound.     Hernia: No hernia is present.  Musculoskeletal:     Cervical back: Normal range of motion and neck supple. No rigidity.     Right lower leg: Edema present.     Left lower leg: Edema present.  Lymphadenopathy:     Cervical: No cervical adenopathy.  Skin:    Findings: No bruising, erythema, lesion or rash.  Neurological:     General: No focal deficit present.     Mental Status: She is alert and oriented to person, place, and time. Mental status is at baseline.     Cranial Nerves: No cranial nerve deficit.     Sensory: No sensory deficit.     Motor: No weakness.     Coordination: Coordination normal.     Gait: Gait normal.     Deep Tendon Reflexes: Reflexes normal.  Psychiatric:        Mood and Affect:  Mood normal.        Behavior: Behavior normal.        Thought Content: Thought content normal.        Judgment: Judgment normal.          Assessment & Plan:  Diastolic dysfunction - Plan: COMPLETE METABOLIC PANEL WITH GFR  Spent the majority of our office visit discussing her  diagnosis.  She has diastolic dysfunction with leg swelling.  We will continue torsemide 20 mg a day along with potassium 20 mill equivalents per day.  I will check a BMP today to monitor her renal function and potassium.  As long as they are stable we will continue this regimen and recheck lab work in 4 months to monitor her kidney function.  Discontinue furosemide and hydrochlorothiazide from her medication list

## 2020-11-27 LAB — COMPLETE METABOLIC PANEL WITH GFR
AG Ratio: 1.5 (calc) (ref 1.0–2.5)
ALT: 33 U/L — ABNORMAL HIGH (ref 6–29)
AST: 25 U/L (ref 10–35)
Albumin: 3.7 g/dL (ref 3.6–5.1)
Alkaline phosphatase (APISO): 47 U/L (ref 37–153)
BUN/Creatinine Ratio: 43 (calc) — ABNORMAL HIGH (ref 6–22)
BUN: 33 mg/dL — ABNORMAL HIGH (ref 7–25)
CO2: 34 mmol/L — ABNORMAL HIGH (ref 20–32)
Calcium: 10.1 mg/dL (ref 8.6–10.4)
Chloride: 99 mmol/L (ref 98–110)
Creat: 0.76 mg/dL (ref 0.60–1.00)
Globulin: 2.5 g/dL (calc) (ref 1.9–3.7)
Glucose, Bld: 156 mg/dL — ABNORMAL HIGH (ref 65–99)
Potassium: 4.5 mmol/L (ref 3.5–5.3)
Sodium: 143 mmol/L (ref 135–146)
Total Bilirubin: 0.4 mg/dL (ref 0.2–1.2)
Total Protein: 6.2 g/dL (ref 6.1–8.1)
eGFR: 80 mL/min/{1.73_m2} (ref >=60)

## 2020-12-30 DIAGNOSIS — M47816 Spondylosis without myelopathy or radiculopathy, lumbar region: Secondary | ICD-10-CM | POA: Diagnosis not present

## 2021-01-14 DIAGNOSIS — G4733 Obstructive sleep apnea (adult) (pediatric): Secondary | ICD-10-CM | POA: Diagnosis not present

## 2021-01-24 DIAGNOSIS — M47816 Spondylosis without myelopathy or radiculopathy, lumbar region: Secondary | ICD-10-CM | POA: Diagnosis not present

## 2021-02-14 DIAGNOSIS — G4733 Obstructive sleep apnea (adult) (pediatric): Secondary | ICD-10-CM | POA: Diagnosis not present

## 2021-02-22 ENCOUNTER — Other Ambulatory Visit: Payer: Self-pay | Admitting: Family Medicine

## 2021-03-01 ENCOUNTER — Other Ambulatory Visit: Payer: Self-pay | Admitting: Family Medicine

## 2021-03-01 DIAGNOSIS — Z1231 Encounter for screening mammogram for malignant neoplasm of breast: Secondary | ICD-10-CM

## 2021-03-09 DIAGNOSIS — H02834 Dermatochalasis of left upper eyelid: Secondary | ICD-10-CM | POA: Diagnosis not present

## 2021-03-09 DIAGNOSIS — E119 Type 2 diabetes mellitus without complications: Secondary | ICD-10-CM | POA: Diagnosis not present

## 2021-03-09 DIAGNOSIS — D3131 Benign neoplasm of right choroid: Secondary | ICD-10-CM | POA: Diagnosis not present

## 2021-03-09 DIAGNOSIS — H02831 Dermatochalasis of right upper eyelid: Secondary | ICD-10-CM | POA: Diagnosis not present

## 2021-03-09 LAB — HM DIABETES EYE EXAM

## 2021-03-10 ENCOUNTER — Other Ambulatory Visit: Payer: Self-pay

## 2021-03-15 ENCOUNTER — Ambulatory Visit
Admission: RE | Admit: 2021-03-15 | Discharge: 2021-03-15 | Disposition: A | Discharge status: Home / Self Care | Payer: Medicare Other | Source: Ambulatory Visit | Attending: Family Medicine | Admitting: Family Medicine

## 2021-03-15 ENCOUNTER — Other Ambulatory Visit: Payer: Self-pay

## 2021-03-15 DIAGNOSIS — Z1231 Encounter for screening mammogram for malignant neoplasm of breast: Secondary | ICD-10-CM | POA: Diagnosis not present

## 2021-03-17 DIAGNOSIS — G4733 Obstructive sleep apnea (adult) (pediatric): Secondary | ICD-10-CM | POA: Diagnosis not present

## 2021-03-18 ENCOUNTER — Other Ambulatory Visit: Payer: Self-pay | Admitting: Family Medicine

## 2021-03-31 ENCOUNTER — Other Ambulatory Visit: Payer: Self-pay

## 2021-03-31 ENCOUNTER — Other Ambulatory Visit: Payer: Self-pay | Admitting: Family Medicine

## 2021-04-06 DIAGNOSIS — M5416 Radiculopathy, lumbar region: Secondary | ICD-10-CM | POA: Diagnosis not present

## 2021-04-12 DIAGNOSIS — G629 Polyneuropathy, unspecified: Secondary | ICD-10-CM | POA: Diagnosis not present

## 2021-04-15 DIAGNOSIS — M47816 Spondylosis without myelopathy or radiculopathy, lumbar region: Secondary | ICD-10-CM | POA: Diagnosis not present

## 2021-04-15 DIAGNOSIS — Z6829 Body mass index (BMI) 29.0-29.9, adult: Secondary | ICD-10-CM | POA: Diagnosis not present

## 2021-04-15 DIAGNOSIS — G629 Polyneuropathy, unspecified: Secondary | ICD-10-CM | POA: Diagnosis not present

## 2021-04-15 DIAGNOSIS — M5416 Radiculopathy, lumbar region: Secondary | ICD-10-CM | POA: Diagnosis not present

## 2021-05-09 ENCOUNTER — Encounter: Payer: Self-pay | Admitting: Cardiology

## 2021-05-09 ENCOUNTER — Ambulatory Visit: Payer: Medicare Other | Admitting: Cardiology

## 2021-05-09 ENCOUNTER — Other Ambulatory Visit: Payer: Self-pay

## 2021-05-09 VITALS — BP 122/68 | HR 59 | Ht 61.0 in | Wt 153.2 lb

## 2021-05-09 DIAGNOSIS — G4733 Obstructive sleep apnea (adult) (pediatric): Secondary | ICD-10-CM

## 2021-05-09 DIAGNOSIS — I10 Essential (primary) hypertension: Secondary | ICD-10-CM

## 2021-05-09 DIAGNOSIS — E785 Hyperlipidemia, unspecified: Secondary | ICD-10-CM

## 2021-05-09 DIAGNOSIS — I25119 Atherosclerotic heart disease of native coronary artery with unspecified angina pectoris: Secondary | ICD-10-CM

## 2021-05-09 DIAGNOSIS — I200 Unstable angina: Secondary | ICD-10-CM | POA: Diagnosis not present

## 2021-05-09 DIAGNOSIS — E1169 Type 2 diabetes mellitus with other specified complication: Secondary | ICD-10-CM

## 2021-05-09 DIAGNOSIS — R600 Localized edema: Secondary | ICD-10-CM

## 2021-05-09 NOTE — Patient Instructions (Addendum)
Medication Instructions:  ?No changes ? ?*If you need a refill on your cardiac medications before your next appointment, please call your pharmacy* ? ? ?Lab Work: ?Not needed ? ? ? ?Testing/Procedures: ?Not needed ? ? ?Follow-Up: ?At [facility], you and your health needs are our priority.  As part of our continuing mission to provide you with exceptional heart care, we have created designated Provider Care Teams.  These Care Teams include your primary Cardiologist (physician) and Advanced Practice Providers (APPs -  Physician Assistants and Nurse Practitioners) who all work together to provide you with the care you need, when you need it. ? ?We recommend signing up for the patient portal called "MyChart".  Sign up information is provided on this After Visit Summary.  MyChart is used to connect with patients for Virtual Visits (Telemedicine).  Patients are able to view lab/test results, encounter notes, upcoming appointments, etc.  Non-urgent messages can be sent to your provider as well.   ?To learn more about what you can do with MyChart, go to [email or web address].   ? ?Your next appointment:   ?12 month(s) ? ?The format for your next appointment:   ?In Person ? ?Provider:   ?Glenetta Hew, MD   ? ? ?Other Instructions ? ?Recommend elevating leggs and keeping knees bent when elevated  ? recommends you purchase some compression  socks/hose from [employer] in [place] ,[place]. You do not need an prescription to purchase the items. ? Address  [address] ?     [place],  [address] ? ?Phone  [phone] ?  ?Compression   strength   x 8-15 mmHg 15-20 mmHg ?                 ?         20-30 mmHg  30-40 mmHg. ? ?You may also try a medical supply store, department store (i.e.- [place], [employer], [employer], specialty shoe stores ( shoe market), [employer] and [employer]) or  Chartered loss adjuster uniform store.   ?

## 2021-05-09 NOTE — Progress Notes (Signed)
? ?Primary Care Provider: Susy Frizzle, MD ?Cardiologist: Glenetta Hew, MD ?Electrophysiologist: None ? ?Clinic Note: ?No chief complaint on file. ? ? ?=================================== ? ?ASSESSMENT/PLAN  ? ?Problem List Items Addressed This Visit   ? ?  ? Cardiology Problems  ? Atherosclerotic heart disease of native coronary artery with angina pectoris ([place]) - Primary (Chronic)  ?  Anginal equivalent was mostly jaw pain.  Usually exertional. ? ?Well-controlled on current dose of metoprolol and Imdur. ? ?  ?  ? Relevant Orders  ? EKG 12-Lead (Completed)  ? Hyperlipidemia associated with type 2 diabetes mellitus (HCC) (Chronic)  ?  Due for labs recheck by PCP soon.  Lipids been well controlled with excellent LDL levels.  Remains on rosuvastatin 40 mg daily. ? ?On Lantus insulin for diabetes, defer to PCP, should have upcoming labs to reassess A1c.  Consider SGLT2 inhibitor in the setting of diabetes, CAD and edema. ? ?  ?  ? Relevant Orders  ? EKG 12-Lead (Completed)  ? Essential hypertension (Chronic)  ?  Blood pressure looks great today.  She is only on Lopressor 25 mg twice daily along with Imdur 60 mg and torsemide 20 mg daily. ? ?  ?  ? Relevant Orders  ? EKG 12-Lead (Completed)  ? Unstable angina - history of (Chronic)  ?  Distant history now ACS/unstable angina, found to have a very significant LCx lesion but not approachable from percutaneous standpoint due to tortuosity.  Has done well medically with no real anginal symptoms.  She has stable exertional dyspnea. ? ?She is on low-dose Lopressor along with Imdur as anginal therapy. ? ?  ?  ?  ? Other  ? Pedal edema  ?  Not associate with PND or orthopnea.  Not relieved with worsening.  No dyspnea.  Probably some component of venous stasis. ?She has been on torsemide from her PCP.  Doing well.  However need to follow-up labs. ? ?Discussed foot elevation and potential compression stockings. ? ?  ?  ? OSA treated with BiPAP (Chronic)  ?  Recommend  continue use of BiPAP/CPAP ? ?  ?  ? ? ?=================================== ? ?HPI:   ? ?April [name] is a 81 y.o. female with a PMH below who presents today for  ~ 1 YR F/U. ? ?May 2015: Single-vessel CAD (severe LCx disease-not approachable by PCI),  ?COPD, OSA on BiPAP (Wo O2) with chronic dyspnea  ? ?April [name] was last seen on March 18, 2020.  She was stable from cardiac standpoint.  Claimed stable exertional dyspnea.  Gets worn out at the end of the day at work but no chest pain or pressure.  No angina.  Mild vertigo symptoms and occasional fluttering symptoms.  But nothing significant to suggest arrhythmia.  No all follow-up none ? ?Recent Hospitalizations: None ? ?Her PCP ordered an echocardiogram back in October because of some leg edema.  Reviewed below. ? ?Reviewed  CV studies:   ? ?The following studies were reviewed today: (if available, images/films reviewed: From Epic Chart or Care Everywhere) ? ?ECHO 11/18/2020: LVEF 60 to 65% with no RWMA.  GR 1 DD.  Normal right heart size and pressures.  Severe aortic valve calcification with thickening.  Mild AS mean gradient 7 mmHg. AVA underestimated 2/2 small LVOT diameter.  Normal RAP ? ?Interval History:  ? ?April [name] returns here today for follow-up doing pretty well.  She has her chronic dizziness nothing worse than usual.  Chronic exertional dyspnea also  no worse than usual.  Rare fluttering off and on last a few seconds.  Nothing prolonged. ?No chest pain or pressure with rest or exertion.  She does have some edema but seems to be somewhat stable.  No PND or orthopnea. ? ?She is active, but not as much as she probably should be. ? ?CV Review of Symptoms (Summary) ?Cardiovascular ROS: positive for - dyspnea on exertion, edema, palpitations, and symptoms described above-relatively benign. ?negative for - chest pain, orthopnea, paroxysmal nocturnal dyspnea, rapid heart rate, shortness of breath, or syncope/near syncope or TIA/amaurosis fugax,  claudication ? ?REVIEWED OF SYSTEMS  ? ?Review of Systems  ?Constitutional:  Negative for malaise/fatigue and weight loss.  ?HENT:  Positive for congestion. Negative for nosebleeds.   ?Respiratory:  Positive for cough and shortness of breath (Stable exertional).   ?Cardiovascular:   ?     Per HPI  ?Genitourinary:  Negative for hematuria.  ?Musculoskeletal:  Positive for joint pain. Negative for back pain and falls.  ?Neurological:  Positive for dizziness. Negative for focal weakness and headaches.  ?Psychiatric/Behavioral:  Negative for depression and memory loss. The patient is not nervous/anxious and does not have insomnia.   ? ?I have reviewed and (if needed) personally updated the patient's problem list, medications, allergies, past medical and surgical history, social and family history.  ? ?PAST MEDICAL HISTORY  ? ?Past Medical History:  ?Diagnosis Date  ? Abnormal weight gain   ? Arthritis   ? "joints" (06/13/2013)  ? COPD (chronic obstructive pulmonary disease) ([place])   ? Coronary artery disease, non-occlusive 06/2013  ? Cardiac Cath: sharp Angle take-off of Large Dominant Cx: ~70-80% mid Cx bifurcation lesion (Lateral OM &  AVGCx-PL-PDA both with hairpin ostial & ~50% lesions) - Not PCI amenable due to vessel tortuosity; ~40-50% mid LAD;Ramus - no significnat diseaes; small non-dominant RCA  ? Diverticulosis   ? GERD (gastroesophageal reflux disease)   ? History of stress test   ? a. 09/2006 nl Dobutamine Echo  ? Hyperlipidemia   ? Hypertension   ? NIDDM (non-insulin dependent diabetes mellitus)   ? On home oxygen therapy   ? "2L q hs; runs into my BIPAP" (06/13/2013)  ? OSA (obstructive sleep apnea)   ? "BIPAP w/O2" (06/13/2013)  ? Tracheobronchomalacia   ? a. followed by [facility].  ? ? ?PAST SURGICAL HISTORY  ? ?Past Surgical History:  ?Procedure Laterality Date  ? CARDIAC CATHETERIZATION  06/2013  ? Cardiac Cath: sharp Angle take-off of Large Dominant Cx: ~70-80% mid Cx bifurcation lesion (Lateral OM &   AVGCx-PL-PDA both with hairpin ostial & ~50% lesions) - Not PCI amenable due to vessel tortuosity; ~40-50% mid LAD;Ramus - no significnat diseaes; small non-dominant RCA  ? LEFT HEART CATHETERIZATION WITH CORONARY ANGIOGRAM N/A 06/16/2013  ? Procedure: LEFT HEART CATHETERIZATION WITH CORONARY ANGIOGRAM;  Surgeon: [name], MD;  Location: [facility] CATH LAB;  Service: Cardiovascular;  Laterality: N/A;  ? TRANSTHORACIC ECHOCARDIOGRAM  06/17/2013  ? Normal LV size and function. EF 55-60% with no regional WMA. Grade 1 diastolic dysfunction. No significant valvular lesions  ? TUBAL LIGATION    ? ? ?Immunization History  ?Administered Date(s) Administered  ? Fluad Quad(high Dose 65+) 11/11/2018, 11/20/2019, 11/04/2020  ? Influenza Nasal 11/14/2011  ? Influenza Split 11/02/2015  ? Influenza Whole 11/26/2006, 11/27/2008, 11/16/2009  ? Influenza,inj,Quad PF,6+ Mos 12/11/2012, 12/22/2013, 11/03/2014, 11/23/2014, 12/10/2017  ? PFIZER(Purple Top)SARS-COV-2 Vaccination 04/17/2019, 05/13/2019  ? Pneumococcal Conjugate-13 01/21/2013  ? Pneumococcal Polysaccharide-23 03/19/2006, 12/22/2013  ? ? ?  MEDICATIONS/ALLERGIES  ? ?Current Meds  ?Medication Sig  ? Calcium Carbonate-Vitamin D (CALCIUM 600 + D PO) Take 1 tablet by mouth daily.  ? gabapentin (NEURONTIN) 600 MG tablet TAKE 1 TABLET BY MOUTH 4 TIMES DAILY (Patient taking differently: Take 600 mg by mouth 2 (two) times daily.)  ? glucose blood (ONETOUCH VERIO) test strip USE AS DIRECTED TO MONITOR BLOOD SUGARS UP TO 3 TIMES DAILY AS NEEDED  ? LANTUS SOLOSTAR 100 UNIT/ML Solostar Pen INJECT 40-50 UNITS UNDER THE SKIN ONCE EVERY NIGHT AT BEDTIME. (Patient taking differently: 40-50 Units at bedtime.)  ? metoprolol tartrate (LOPRESSOR) 25 MG tablet Take 1 tablet (25 mg total) by mouth 2 (two) times daily.  ? Multiple Vitamin (MULTIVITAMIN) capsule Take 1 capsule by mouth daily.  ? Omega-3 Fatty Acids (FISH OIL) 1000 MG CAPS Take 1 capsule by mouth daily.  ? omeprazole (PRILOSEC) 20 MG  capsule Take 20 mg by mouth daily.  ? torsemide (DEMADEX) 20 MG tablet Take 1 tablet (20 mg total) by mouth daily.  ? [DISCONTINUED] HYDROcodone-acetaminophen (NORCO/VICODIN) 5-325 MG tablet TAKE 1 TABLET BY MOUTH E

## 2021-05-11 ENCOUNTER — Other Ambulatory Visit: Payer: Self-pay | Admitting: Family Medicine

## 2021-05-12 ENCOUNTER — Other Ambulatory Visit: Payer: Self-pay

## 2021-05-12 ENCOUNTER — Emergency Department (HOSPITAL_BASED_OUTPATIENT_CLINIC_OR_DEPARTMENT_OTHER): Payer: Medicare Other | Admitting: Radiology

## 2021-05-12 ENCOUNTER — Inpatient Hospital Stay (HOSPITAL_BASED_OUTPATIENT_CLINIC_OR_DEPARTMENT_OTHER)
Admission: EM | Admit: 2021-05-12 | Discharge: 2021-05-16 | DRG: 392 | Disposition: A | Discharge status: Home / Self Care | Payer: Medicare Other | Attending: Family Medicine | Admitting: Family Medicine

## 2021-05-12 ENCOUNTER — Emergency Department (HOSPITAL_BASED_OUTPATIENT_CLINIC_OR_DEPARTMENT_OTHER): Payer: Medicare Other

## 2021-05-12 ENCOUNTER — Encounter (HOSPITAL_BASED_OUTPATIENT_CLINIC_OR_DEPARTMENT_OTHER): Payer: Self-pay

## 2021-05-12 DIAGNOSIS — Z825 Family history of asthma and other chronic lower respiratory diseases: Secondary | ICD-10-CM

## 2021-05-12 DIAGNOSIS — M47816 Spondylosis without myelopathy or radiculopathy, lumbar region: Secondary | ICD-10-CM | POA: Diagnosis not present

## 2021-05-12 DIAGNOSIS — K8020 Calculus of gallbladder without cholecystitis without obstruction: Secondary | ICD-10-CM | POA: Diagnosis not present

## 2021-05-12 DIAGNOSIS — I2510 Atherosclerotic heart disease of native coronary artery without angina pectoris: Secondary | ICD-10-CM | POA: Diagnosis present

## 2021-05-12 DIAGNOSIS — E785 Hyperlipidemia, unspecified: Secondary | ICD-10-CM | POA: Diagnosis present

## 2021-05-12 DIAGNOSIS — K449 Diaphragmatic hernia without obstruction or gangrene: Secondary | ICD-10-CM | POA: Diagnosis not present

## 2021-05-12 DIAGNOSIS — E119 Type 2 diabetes mellitus without complications: Secondary | ICD-10-CM

## 2021-05-12 DIAGNOSIS — I10 Essential (primary) hypertension: Secondary | ICD-10-CM | POA: Diagnosis not present

## 2021-05-12 DIAGNOSIS — K5732 Diverticulitis of large intestine without perforation or abscess without bleeding: Secondary | ICD-10-CM | POA: Diagnosis not present

## 2021-05-12 DIAGNOSIS — Z8249 Family history of ischemic heart disease and other diseases of the circulatory system: Secondary | ICD-10-CM

## 2021-05-12 DIAGNOSIS — Z832 Family history of diseases of the blood and blood-forming organs and certain disorders involving the immune mechanism: Secondary | ICD-10-CM

## 2021-05-12 DIAGNOSIS — K5900 Constipation, unspecified: Secondary | ICD-10-CM | POA: Diagnosis not present

## 2021-05-12 DIAGNOSIS — G4733 Obstructive sleep apnea (adult) (pediatric): Secondary | ICD-10-CM | POA: Diagnosis present

## 2021-05-12 DIAGNOSIS — Z79899 Other long term (current) drug therapy: Secondary | ICD-10-CM

## 2021-05-12 DIAGNOSIS — Z794 Long term (current) use of insulin: Secondary | ICD-10-CM

## 2021-05-12 DIAGNOSIS — Z7984 Long term (current) use of oral hypoglycemic drugs: Secondary | ICD-10-CM

## 2021-05-12 DIAGNOSIS — Z833 Family history of diabetes mellitus: Secondary | ICD-10-CM

## 2021-05-12 DIAGNOSIS — J449 Chronic obstructive pulmonary disease, unspecified: Secondary | ICD-10-CM | POA: Diagnosis present

## 2021-05-12 DIAGNOSIS — Z20822 Contact with and (suspected) exposure to covid-19: Secondary | ICD-10-CM | POA: Diagnosis present

## 2021-05-12 DIAGNOSIS — Z888 Allergy status to other drugs, medicaments and biological substances status: Secondary | ICD-10-CM

## 2021-05-12 DIAGNOSIS — Z9981 Dependence on supplemental oxygen: Secondary | ICD-10-CM

## 2021-05-12 DIAGNOSIS — Z87891 Personal history of nicotine dependence: Secondary | ICD-10-CM

## 2021-05-12 DIAGNOSIS — K5792 Diverticulitis of intestine, part unspecified, without perforation or abscess without bleeding: Secondary | ICD-10-CM | POA: Diagnosis present

## 2021-05-12 DIAGNOSIS — K219 Gastro-esophageal reflux disease without esophagitis: Secondary | ICD-10-CM | POA: Diagnosis present

## 2021-05-12 DIAGNOSIS — K5730 Diverticulosis of large intestine without perforation or abscess without bleeding: Secondary | ICD-10-CM | POA: Diagnosis not present

## 2021-05-12 LAB — COMPREHENSIVE METABOLIC PANEL
ALT: 45 U/L — ABNORMAL HIGH (ref 0–44)
AST: 32 U/L (ref 15–41)
Albumin: 4.1 g/dL (ref 3.5–5.0)
Alkaline Phosphatase: 50 U/L (ref 38–126)
Anion gap: 11 (ref 5–15)
BUN: 15 mg/dL (ref 8–23)
CO2: 33 mmol/L — ABNORMAL HIGH (ref 22–32)
Calcium: 10.4 mg/dL — ABNORMAL HIGH (ref 8.9–10.3)
Chloride: 98 mmol/L (ref 98–111)
Creatinine, Ser: 0.83 mg/dL (ref 0.44–1.00)
GFR, Estimated: >60 mL/min (ref >60)
Glucose, Bld: 209 mg/dL — ABNORMAL HIGH (ref 70–99)
Potassium: 3.2 mmol/L — ABNORMAL LOW (ref 3.5–5.1)
Sodium: 142 mmol/L (ref 135–145)
Total Bilirubin: 0.4 mg/dL (ref 0.3–1.2)
Total Protein: 7.2 g/dL (ref 6.5–8.1)

## 2021-05-12 LAB — RESP PANEL BY RT-PCR (FLU A&B, COVID) ARPGX2
Influenza A by PCR: NEGATIVE
Influenza B by PCR: NEGATIVE
SARS Coronavirus 2 by RT PCR: NEGATIVE

## 2021-05-12 LAB — URINALYSIS, ROUTINE W REFLEX MICROSCOPIC
Bilirubin Urine: NEGATIVE
Glucose, UA: NEGATIVE mg/dL
Hgb urine dipstick: NEGATIVE
Ketones, ur: NEGATIVE mg/dL
Leukocytes,Ua: NEGATIVE
Nitrite: NEGATIVE
Protein, ur: 100 mg/dL — AB
Specific Gravity, Urine: 1.016 (ref 1.005–1.030)
pH: 8 (ref 5.0–8.0)

## 2021-05-12 LAB — CBC
HCT: 47.2 % — ABNORMAL HIGH (ref 36.0–46.0)
Hemoglobin: 15 g/dL (ref 12.0–15.0)
MCH: 29.2 pg (ref 26.0–34.0)
MCHC: 31.8 g/dL (ref 30.0–36.0)
MCV: 91.8 fL (ref 80.0–100.0)
Platelets: 191 10*3/uL (ref 150–400)
RBC: 5.14 MIL/uL — ABNORMAL HIGH (ref 3.87–5.11)
RDW: 13.6 % (ref 11.5–15.5)
WBC: 10.6 10*3/uL — ABNORMAL HIGH (ref 4.0–10.5)
nRBC: 0 % (ref 0.0–0.2)

## 2021-05-12 LAB — LIPASE, BLOOD: Lipase: 53 U/L — ABNORMAL HIGH (ref 11–51)

## 2021-05-12 MED ORDER — PROCHLORPERAZINE EDISYLATE 10 MG/2ML IJ SOLN
5.0000 mg | Freq: Once | INTRAMUSCULAR | Status: DC
  Filled 2021-05-12: qty 2

## 2021-05-12 MED ORDER — HYDROMORPHONE HCL 1 MG/ML IJ SOLN
0.5000 mg | Freq: Once | INTRAMUSCULAR | Status: AC
  Administered 2021-05-12: 0.5 mg via INTRAVENOUS
  Filled 2021-05-12: qty 1

## 2021-05-12 MED ORDER — ONDANSETRON HCL 4 MG/2ML IJ SOLN
4.0000 mg | Freq: Once | INTRAMUSCULAR | Status: AC
  Administered 2021-05-12: 4 mg via INTRAVENOUS
  Filled 2021-05-12: qty 2

## 2021-05-12 MED ORDER — LACTATED RINGERS IV BOLUS
500.0000 mL | Freq: Once | INTRAVENOUS | Status: AC
  Administered 2021-05-12: 500 mL via INTRAVENOUS

## 2021-05-12 MED ORDER — IOHEXOL 300 MG/ML  SOLN
100.0000 mL | Freq: Once | INTRAMUSCULAR | Status: AC | PRN
  Administered 2021-05-12: 80 mL via INTRAVENOUS

## 2021-05-12 NOTE — ED Notes (Signed)
3L applied for spo2 86% after dilaudid, wears bipap (clarified that pt does wear bipap and not cpap) at home ?

## 2021-05-12 NOTE — ED Provider Notes (Signed)
?[facility] ?Provider Note ? ? ?CSN: [id number] ?Arrival date & time: 05/12/21  2043 ? ?  ? ?History ? ?Chief Complaint  ?Patient presents with  ? Abdominal Pain  ? ? ?April [name] is a 81 y.o. female. ? ? ?Abdominal Pain ?Associated symptoms: nausea and vomiting   ?Associated symptoms: no chest pain and no shortness of breath   ?Patient presents abdominal pain.  Nausea or vomiting.  Began to feel bad yesterday.  Upper abdominal pain.  Feels as if she needs to have a bowel movement.  States she did have a bowel movement yesterday.  No fevers or chills.  Has been vomiting but no blood in the emesis.  Has not had pains like this before.  Denies previous abdominal surgery. ?  ?Past Medical History:  ?Diagnosis Date  ? Abnormal weight gain   ? Arthritis   ? "joints" (06/13/2013)  ? COPD (chronic obstructive pulmonary disease) ([place])   ? Coronary artery disease, non-occlusive 06/2013  ? Cardiac Cath: sharp Angle take-off of Large Dominant Cx: ~70-80% mid Cx bifurcation lesion (Lateral OM &  AVGCx-PL-PDA both with hairpin ostial & ~50% lesions) - Not PCI amenable due to vessel tortuosity; ~40-50% mid LAD;Ramus - no significnat diseaes; small non-dominant RCA  ? Diverticulosis   ? GERD (gastroesophageal reflux disease)   ? History of stress test   ? a. 09/2006 nl Dobutamine Echo  ? Hyperlipidemia   ? Hypertension   ? NIDDM (non-insulin dependent diabetes mellitus)   ? On home oxygen therapy   ? "2L q hs; runs into my BIPAP" (06/13/2013)  ? OSA (obstructive sleep apnea)   ? "BIPAP w/O2" (06/13/2013)  ? Tracheobronchomalacia   ? a. followed by [facility].  ? ?Past Surgical History:  ?Procedure Laterality Date  ? CARDIAC CATHETERIZATION  06/2013  ? Cardiac Cath: sharp Angle take-off of Large Dominant Cx: ~70-80% mid Cx bifurcation lesion (Lateral OM &  AVGCx-PL-PDA both with hairpin ostial & ~50% lesions) - Not PCI amenable due to vessel tortuosity; ~40-50% mid LAD;Ramus - no significnat diseaes; small  non-dominant RCA  ? LEFT HEART CATHETERIZATION WITH CORONARY ANGIOGRAM N/A 06/16/2013  ? Procedure: LEFT HEART CATHETERIZATION WITH CORONARY ANGIOGRAM;  Surgeon: [name], MD;  Location: [facility] CATH LAB;  Service: Cardiovascular;  Laterality: N/A;  ? TRANSTHORACIC ECHOCARDIOGRAM  06/17/2013  ? Normal LV size and function. EF 55-60% with no regional WMA. Grade 1 diastolic dysfunction. No significant valvular lesions  ? TUBAL LIGATION    ? ? ?Home Medications ?Prior to Admission medications   ?Medication Sig Start Date End Date Taking? Authorizing Provider  ?Calcium Carbonate-Vitamin D (CALCIUM 600 + D PO) Take 1 tablet by mouth daily.    [provider]  ?gabapentin (NEURONTIN) 600 MG tablet TAKE 1 TABLET BY MOUTH 4 TIMES DAILY ?Patient taking differently: Take 600 mg by mouth 2 (two) times daily. 03/31/21   Susy Frizzle, MD  ?glucose blood (ONETOUCH VERIO) test strip USE AS DIRECTED TO MONITOR BLOOD SUGARS UP TO 3 TIMES DAILY AS NEEDED 02/22/21   Susy Frizzle, MD  ?HYDROcodone-acetaminophen (NORCO/VICODIN) 5-325 MG tablet TAKE 1 TABLET BY MOUTH EVERY 6 HOURS AS NEEDED FOR MODERATE PAIN. 06/03/20   Susy Frizzle, MD  ?isosorbide mononitrate (IMDUR) 60 MG 24 hr tablet TAKE 1 TABLET BY MOUTH ONCE DAILY 08/25/20   [name], MD  ?LANTUS SOLOSTAR 100 UNIT/ML Solostar Pen INJECT 40-50 UNITS UNDER THE SKIN ONCE EVERY NIGHT AT BEDTIME. 03/18/21  Susy Frizzle, MD  ?metoprolol tartrate (LOPRESSOR) 25 MG tablet Take 1 tablet (25 mg total) by mouth 2 (two) times daily. 07/22/20   [name], MD  ?Multiple Vitamin (MULTIVITAMIN) capsule Take 1 capsule by mouth daily.    [provider]  ?nitroGLYCERIN (NITROSTAT) 0.4 MG SL tablet Place 1 tablet (0.4 mg total) under the tongue every 5 (five) minutes x 3 doses as needed for chest pain. 02/29/16   [name], MD  ?Omega-3 Fatty Acids (FISH OIL) 1000 MG CAPS Take 1 capsule by mouth daily.    [provider]  ?omeprazole  (PRILOSEC) 20 MG capsule Take 20 mg by mouth daily.    [provider]  ?potassium chloride (KLOR-CON) 10 MEQ tablet TAKE 1 TABLET BY MOUTH TWICE A DAY 05/11/21   Susy Frizzle, MD  ?rosuvastatin (CRESTOR) 40 MG tablet TAKE ONE TABLET BY MOUTH ONCE DAILY 08/25/20   [name], MD  ?torsemide (DEMADEX) 20 MG tablet Take 1 tablet (20 mg total) by mouth daily. 11/11/20   Susy Frizzle, MD  ?   ? ?Allergies    ?Neomycin   ? ?Review of Systems   ?Review of Systems  ?Constitutional:  Negative for appetite change.  ?Respiratory:  Negative for shortness of breath.   ?Cardiovascular:  Negative for chest pain.  ?Gastrointestinal:  Positive for abdominal pain, nausea and vomiting.  ?Neurological:  Negative for weakness.  ? ?Physical Exam ?Updated Vital Signs ?BP (!) 152/82   Pulse 74   Temp 97.9 ?F (36.6 ?C) (Oral)   Resp 18   LMP  (LMP Unknown)   SpO2 99%  ?Physical Exam ?Vitals and nursing note reviewed.  ?Pulmonary:  ?   Effort: Pulmonary effort is normal.  ?Abdominal:  ?   Comments: Somewhat diffuse abdominal tenderness worse in the upper abdomen.  No rebound or guarding.  No hernia palpated.  ?Skin: ?   General: Skin is warm.  ?Neurological:  ?   Mental Status: She is alert.  ? ? ?ED Results / Procedures / Treatments   ?Labs ?(all labs ordered are listed, but only abnormal results are displayed) ?Labs Reviewed  ?LIPASE, BLOOD - Abnormal; Notable for the following components:  ?    Result Value  ? Lipase 53 (*)   ? All other components within normal limits  ?COMPREHENSIVE METABOLIC PANEL - Abnormal; Notable for the following components:  ? Potassium 3.2 (*)   ? CO2 33 (*)   ? Glucose, Bld 209 (*)   ? Calcium 10.4 (*)   ? ALT 45 (*)   ? All other components within normal limits  ?CBC - Abnormal; Notable for the following components:  ? WBC 10.6 (*)   ? RBC 5.14 (*)   ? HCT 47.2 (*)   ? All other components within normal limits  ?URINALYSIS, ROUTINE W REFLEX MICROSCOPIC - Abnormal; Notable for the  following components:  ? APPearance HAZY (*)   ? Protein, ur 100 (*)   ? Bacteria, UA RARE (*)   ? All other components within normal limits  ?RESP PANEL BY RT-PCR (FLU A&B, COVID) ARPGX2  ? ? ?EKG ?EKG Interpretation ? ?Date/Time:  Thursday May 12 2021 21:04:30 EDT ?Ventricular Rate:  69 ?PR Interval:  162 ?QRS Duration: 82 ?QT Interval:  530 ?QTC Calculation: 567 ?R Axis:   27 ?Text Interpretation: Normal sinus rhythm Septal infarct , age undetermined Abnormal ECG When compared with ECG of 15-Jun-2013 05:42, T wave inversion now evident  in Inferior leads T wave inversion now evident in Anterior leads Confirmed by Davonna Belling [phone]) on 05/12/2021 11:06:43 PM ? ?Radiology ?CT ABDOMEN PELVIS W CONTRAST ? ?Result Date: 05/12/2021 ?CLINICAL DATA:  Nausea/vomiting EXAM: CT ABDOMEN AND PELVIS WITH CONTRAST TECHNIQUE: Multidetector CT imaging of the abdomen and pelvis was performed using the standard protocol following bolus administration of intravenous contrast. RADIATION DOSE REDUCTION: This exam was performed according to the departmental dose-optimization program which includes automated exposure control, adjustment of the mA and/or kV according to patient size and/or use of iterative reconstruction technique. CONTRAST:  23m OMNIPAQUE IOHEXOL 300 MG/ML  SOLN COMPARISON:  None. FINDINGS: Lower chest: Coronary calcification.  Moderate volume hiatal hernia. Hepatobiliary: No focal liver abnormality. Calcified gallstone noted within the gallbladder lumen. No gallbladder wall thickening or pericholecystic fluid. No biliary dilatation. Pancreas: No focal lesion. Normal pancreatic contour. No surrounding inflammatory changes. No main pancreatic ductal dilatation. Spleen: Normal in size without focal abnormality. Adrenals/Urinary Tract: No adrenal nodule bilaterally. Bilateral kidneys enhance symmetrically. Subcentimeter hypodensities are too small to characterize. No hydronephrosis. No hydroureter. The urinary  bladder is unremarkable. Stomach/Bowel: Stomach is within normal limits. No evidence of small bowel wall thickening or dilatation. Diffuse sigmoid and scattered colonic diverticulosis. Sigmoid bowel wall thickening wit

## 2021-05-12 NOTE — ED Triage Notes (Incomplete)
Pt BIB son from home.  Per son, pt c/o abdominal pain since 2PM today.  Pt moaning in pain.  Pt c/o being nauseous and dry heaving, no vomiting/diarrhea. Pt took milk of mag at home with no relief. ?

## 2021-05-13 DIAGNOSIS — Z7984 Long term (current) use of oral hypoglycemic drugs: Secondary | ICD-10-CM | POA: Diagnosis not present

## 2021-05-13 DIAGNOSIS — Z794 Long term (current) use of insulin: Secondary | ICD-10-CM | POA: Diagnosis not present

## 2021-05-13 DIAGNOSIS — Z833 Family history of diabetes mellitus: Secondary | ICD-10-CM | POA: Diagnosis not present

## 2021-05-13 DIAGNOSIS — K5792 Diverticulitis of intestine, part unspecified, without perforation or abscess without bleeding: Secondary | ICD-10-CM | POA: Diagnosis not present

## 2021-05-13 DIAGNOSIS — G4733 Obstructive sleep apnea (adult) (pediatric): Secondary | ICD-10-CM | POA: Diagnosis not present

## 2021-05-13 DIAGNOSIS — I10 Essential (primary) hypertension: Secondary | ICD-10-CM | POA: Diagnosis not present

## 2021-05-13 DIAGNOSIS — J449 Chronic obstructive pulmonary disease, unspecified: Secondary | ICD-10-CM | POA: Diagnosis not present

## 2021-05-13 DIAGNOSIS — E119 Type 2 diabetes mellitus without complications: Secondary | ICD-10-CM | POA: Diagnosis not present

## 2021-05-13 DIAGNOSIS — Z20822 Contact with and (suspected) exposure to covid-19: Secondary | ICD-10-CM | POA: Diagnosis not present

## 2021-05-13 DIAGNOSIS — Z8249 Family history of ischemic heart disease and other diseases of the circulatory system: Secondary | ICD-10-CM | POA: Diagnosis not present

## 2021-05-13 DIAGNOSIS — Z9981 Dependence on supplemental oxygen: Secondary | ICD-10-CM | POA: Diagnosis not present

## 2021-05-13 DIAGNOSIS — I2510 Atherosclerotic heart disease of native coronary artery without angina pectoris: Secondary | ICD-10-CM | POA: Diagnosis not present

## 2021-05-13 DIAGNOSIS — Z832 Family history of diseases of the blood and blood-forming organs and certain disorders involving the immune mechanism: Secondary | ICD-10-CM | POA: Diagnosis not present

## 2021-05-13 DIAGNOSIS — Z87891 Personal history of nicotine dependence: Secondary | ICD-10-CM | POA: Diagnosis not present

## 2021-05-13 DIAGNOSIS — K219 Gastro-esophageal reflux disease without esophagitis: Secondary | ICD-10-CM | POA: Diagnosis not present

## 2021-05-13 DIAGNOSIS — K5732 Diverticulitis of large intestine without perforation or abscess without bleeding: Secondary | ICD-10-CM | POA: Diagnosis not present

## 2021-05-13 DIAGNOSIS — Z79899 Other long term (current) drug therapy: Secondary | ICD-10-CM | POA: Diagnosis not present

## 2021-05-13 DIAGNOSIS — E785 Hyperlipidemia, unspecified: Secondary | ICD-10-CM | POA: Diagnosis not present

## 2021-05-13 DIAGNOSIS — Z888 Allergy status to other drugs, medicaments and biological substances status: Secondary | ICD-10-CM | POA: Diagnosis not present

## 2021-05-13 DIAGNOSIS — Z825 Family history of asthma and other chronic lower respiratory diseases: Secondary | ICD-10-CM | POA: Diagnosis not present

## 2021-05-13 HISTORY — DX: Diverticulitis of intestine, part unspecified, without perforation or abscess without bleeding: K57.92

## 2021-05-13 LAB — CBC
HCT: 46.6 % — ABNORMAL HIGH (ref 36.0–46.0)
Hemoglobin: 14.7 g/dL (ref 12.0–15.0)
MCH: 29.9 pg (ref 26.0–34.0)
MCHC: 31.5 g/dL (ref 30.0–36.0)
MCV: 94.9 fL (ref 80.0–100.0)
Platelets: 175 10*3/uL (ref 150–400)
RBC: 4.91 MIL/uL (ref 3.87–5.11)
RDW: 13.6 % (ref 11.5–15.5)
WBC: 14.4 10*3/uL — ABNORMAL HIGH (ref 4.0–10.5)
nRBC: 0 % (ref 0.0–0.2)

## 2021-05-13 LAB — GLUCOSE, CAPILLARY
Glucose-Capillary: 192 mg/dL — ABNORMAL HIGH (ref 70–99)
Glucose-Capillary: 144 mg/dL — ABNORMAL HIGH (ref 70–99)
Glucose-Capillary: 132 mg/dL — ABNORMAL HIGH (ref 70–99)
Glucose-Capillary: 151 mg/dL — ABNORMAL HIGH (ref 70–99)
Glucose-Capillary: 113 mg/dL — ABNORMAL HIGH (ref 70–99)

## 2021-05-13 LAB — HEMOGLOBIN A1C
Hgb A1c MFr Bld: 7.3 % — ABNORMAL HIGH (ref 4.8–5.6)
Mean Plasma Glucose: 162.81 mg/dL

## 2021-05-13 MED ORDER — METRONIDAZOLE 500 MG/100ML IV SOLN
500.0000 mg | Freq: Two times a day (BID) | INTRAVENOUS | Status: DC
  Administered 2021-05-13 – 2021-05-16 (×6): 500 mg via INTRAVENOUS
  Filled 2021-05-13 (×6): qty 100

## 2021-05-13 MED ORDER — METRONIDAZOLE 500 MG/100ML IV SOLN
500.0000 mg | Freq: Once | INTRAVENOUS | Status: AC
Start: 2021-05-13 — End: 2021-05-13
  Administered 2021-05-13: 500 mg via INTRAVENOUS
  Filled 2021-05-13: qty 100

## 2021-05-13 MED ORDER — ENOXAPARIN SODIUM 40 MG/0.4ML IJ SOSY: 40.0000 mg | PREFILLED_SYRINGE | INTRAMUSCULAR | Status: DC

## 2021-05-13 MED ORDER — ONDANSETRON HCL 4 MG PO TABS
4.0000 mg | ORAL_TABLET | Freq: Four times a day (QID) | ORAL | Status: DC | PRN
  Administered 2021-05-13: 4 mg via ORAL
  Filled 2021-05-13: qty 1

## 2021-05-13 MED ORDER — LACTATED RINGERS IV SOLN: INTRAVENOUS | Status: DC

## 2021-05-13 MED ORDER — MELATONIN 3 MG PO TABS
3.0000 mg | ORAL_TABLET | Freq: Every evening | ORAL | Status: DC | PRN
  Administered 2021-05-14: 3 mg via ORAL
  Filled 2021-05-13 (×2): qty 1

## 2021-05-13 MED ORDER — POTASSIUM CHLORIDE CRYS ER 20 MEQ PO TBCR
40.0000 meq | EXTENDED_RELEASE_TABLET | Freq: Once | ORAL | Status: AC
  Administered 2021-05-13: 40 meq via ORAL
  Filled 2021-05-13: qty 2

## 2021-05-13 MED ORDER — ACETAMINOPHEN 325 MG PO TABS
650.0000 mg | ORAL_TABLET | Freq: Four times a day (QID) | ORAL | Status: DC | PRN
  Administered 2021-05-13 – 2021-05-16 (×4): 650 mg via ORAL
  Filled 2021-05-13 (×4): qty 2

## 2021-05-13 MED ORDER — ACETAMINOPHEN 650 MG RE SUPP: 650.0000 mg | Freq: Four times a day (QID) | RECTAL | Status: DC | PRN

## 2021-05-13 MED ORDER — MORPHINE SULFATE (PF) 2 MG/ML IV SOLN
2.0000 mg | INTRAVENOUS | Status: DC | PRN
  Administered 2021-05-14 (×2): 2 mg via INTRAVENOUS
  Administered 2021-05-14 – 2021-05-15 (×3): 4 mg via INTRAVENOUS
  Filled 2021-05-13 (×2): qty 1
  Filled 2021-05-13 (×3): qty 2

## 2021-05-13 MED ORDER — SODIUM CHLORIDE 0.9 % IV SOLN
2.0000 g | INTRAVENOUS | Status: DC
  Administered 2021-05-13 – 2021-05-15 (×3): 2 g via INTRAVENOUS
  Filled 2021-05-13 (×3): qty 20

## 2021-05-13 MED ORDER — ONDANSETRON HCL 4 MG/2ML IJ SOLN
4.0000 mg | Freq: Once | INTRAMUSCULAR | Status: AC
Start: 2021-05-13 — End: 2021-05-13
  Administered 2021-05-13: 4 mg via INTRAVENOUS
  Filled 2021-05-13: qty 2

## 2021-05-13 MED ORDER — CIPROFLOXACIN IN D5W 400 MG/200ML IV SOLN
400.0000 mg | Freq: Once | INTRAVENOUS | Status: AC
  Administered 2021-05-13: 400 mg via INTRAVENOUS
  Filled 2021-05-13: qty 200

## 2021-05-13 MED ORDER — POTASSIUM CHLORIDE 20 MEQ PO PACK
40.0000 meq | PACK | Freq: Once | ORAL | Status: AC
Start: 2021-05-13 — End: 2021-05-13
  Administered 2021-05-13: 40 meq via ORAL
  Filled 2021-05-13: qty 2

## 2021-05-13 MED ORDER — ONDANSETRON HCL 4 MG/2ML IJ SOLN
4.0000 mg | Freq: Four times a day (QID) | INTRAMUSCULAR | Status: DC | PRN
  Administered 2021-05-13 – 2021-05-15 (×3): 4 mg via INTRAVENOUS
  Filled 2021-05-13 (×3): qty 2

## 2021-05-13 MED ORDER — KETOROLAC TROMETHAMINE 30 MG/ML IJ SOLN
15.0000 mg | Freq: Once | INTRAMUSCULAR | Status: AC
Start: 2021-05-13 — End: 2021-05-13
  Administered 2021-05-13: 15 mg via INTRAVENOUS
  Filled 2021-05-13: qty 1

## 2021-05-13 MED ORDER — INSULIN ASPART 100 UNIT/ML IJ SOLN
0.0000 [IU] | INTRAMUSCULAR | Status: DC
  Administered 2021-05-13 (×2): 2 [IU] via SUBCUTANEOUS
  Administered 2021-05-13 – 2021-05-16 (×7): 1 [IU] via SUBCUTANEOUS

## 2021-05-13 MED ORDER — LACTATED RINGERS IV BOLUS
1000.0000 mL | Freq: Once | INTRAVENOUS | Status: AC
  Administered 2021-05-13: 1000 mL via INTRAVENOUS

## 2021-05-13 NOTE — Assessment & Plan Note (Addendum)
Continue metoprolol and Imdur

## 2021-05-13 NOTE — Assessment & Plan Note (Addendum)
Patient presented with left lower quadrant abdominal pain associated with nausea and vomiting. ?CT abdomen pelvis showed uncomplicated acute diverticulitis. ?Continued IV hydration.  Continued IV Zofran as needed for nausea vomiting. ?Continued IV antibiotics ( ceftriaxone and metronidazole). ?Continue adequate pain control with morphine. ?Patient feels very much improved.  Patient being discharged home on oral antibiotics. ?Follow-up with GI after resolution for possible colonoscopy ?

## 2021-05-13 NOTE — Care Plan (Signed)
This 81 years old female with PMH significant for hypertension, diabetes, OSA presented in the ED with complaints of left lower quadrant abdominal pain that started yesterday associated with nausea and vomiting.  She denies any hematemesis, fever or chills.  Denies similar symptoms in the past.  CT A/P showed uncomplicated acute sigmoid diverticulitis.  Patient is admitted for acute diverticulitis, started on IV antibiotics.  Adequate pain control.   Kept NPO last night, will start clear liquid diet and advance as tolerated.  Patient was seen and examined at bedside. ?

## 2021-05-13 NOTE — Progress Notes (Signed)
Per RN: pt had episode of emesis with traces of blood and small clot visible. ? ?1) SCDs only for DVT ppx (hadnt started on chemo ppx yet). ?2) zofran PRN ?3) checking CBC ?

## 2021-05-13 NOTE — Assessment & Plan Note (Addendum)
Continue BiPAP at night and as needed. ?

## 2021-05-13 NOTE — Assessment & Plan Note (Addendum)
Continue regular insulin sliding scale. ?

## 2021-05-13 NOTE — H&P (Signed)
?History and Physical  ? ? ?Patient: April [name] CHY:[id number] DOB: 1941/01/29 ?DOA: 05/12/2021 ?DOS: the patient was seen and examined on 05/13/2021 ?PCP: Susy Frizzle, MD  ?Patient coming from: Home ? ?Chief Complaint:  ?Chief Complaint  ?Patient presents with  ? Abdominal Pain  ? ?HPI: April [name] is a 81 y.o. female with medical history significant of DM2, HTN, OSA. ? ?Pt presents to ED at med center with c/o ABD pain.  Onset yesterday.  Has vomiting.  No hematemesis.  No fevers nor chills.  Not had similar symptoms in past, no prior abd surgeries. ? ?No CP, no SOB. ? ?Symptoms persistent, improved in ED with dilaudid for pain. ?Review of Systems: As mentioned in the history of present illness. All other systems reviewed and are negative. ?Past Medical History:  ?Diagnosis Date  ? Abnormal weight gain   ? Arthritis   ? "joints" (06/13/2013)  ? COPD (chronic obstructive pulmonary disease) ([place])   ? Coronary artery disease, non-occlusive 06/2013  ? Cardiac Cath: sharp Angle take-off of Large Dominant Cx: ~70-80% mid Cx bifurcation lesion (Lateral OM &  AVGCx-PL-PDA both with hairpin ostial & ~50% lesions) - Not PCI amenable due to vessel tortuosity; ~40-50% mid LAD;Ramus - no significnat diseaes; small non-dominant RCA  ? Diverticulosis   ? GERD (gastroesophageal reflux disease)   ? History of stress test   ? a. 09/2006 nl Dobutamine Echo  ? Hyperlipidemia   ? Hypertension   ? NIDDM (non-insulin dependent diabetes mellitus)   ? On home oxygen therapy   ? "2L q hs; runs into my BIPAP" (06/13/2013)  ? OSA (obstructive sleep apnea)   ? "BIPAP w/O2" (06/13/2013)  ? Tracheobronchomalacia   ? a. followed by [facility].  ? ?Past Surgical History:  ?Procedure Laterality Date  ? CARDIAC CATHETERIZATION  06/2013  ? Cardiac Cath: sharp Angle take-off of Large Dominant Cx: ~70-80% mid Cx bifurcation lesion (Lateral OM &  AVGCx-PL-PDA both with hairpin ostial & ~50% lesions) - Not PCI amenable due to vessel tortuosity;  ~40-50% mid LAD;Ramus - no significnat diseaes; small non-dominant RCA  ? LEFT HEART CATHETERIZATION WITH CORONARY ANGIOGRAM N/A 06/16/2013  ? Procedure: LEFT HEART CATHETERIZATION WITH CORONARY ANGIOGRAM;  Surgeon: [name], MD;  Location: [facility] CATH LAB;  Service: Cardiovascular;  Laterality: N/A;  ? TRANSTHORACIC ECHOCARDIOGRAM  06/17/2013  ? Normal LV size and function. EF 55-60% with no regional WMA. Grade 1 diastolic dysfunction. No significant valvular lesions  ? TUBAL LIGATION    ? ?Social History:  reports that she quit smoking about 35 years ago. Her smoking use included cigarettes. She has a 30.00 pack-year smoking history. She has never used smokeless tobacco. She reports that she does not drink alcohol and does not use drugs. ? ?Allergies  ?Allergen Reactions  ? Neomycin Other (See Comments)  ?  Visual disturbance and swelling in eyes  ? ? ?Family History  ?Problem Relation Age of Onset  ? Heart disease Mother   ?     developed coronary dzs in her 51's.  ? Clotting disorder Mother   ? Breast cancer Sister 77  ? Diabetes Paternal Aunt   ? Asthma Maternal Grandmother   ? Heart disease Maternal Grandmother   ? Heart disease Maternal Grandfather   ? Diabetes Brother   ? CAD Brother   ?     s/p cabg in his 79's  ? CAD Brother   ?     s/p cabg in his 91's.  ? ? ?  Prior to Admission medications   ?Medication Sig Start Date End Date Taking? Authorizing Provider  ?Calcium Carbonate-Vitamin D (CALCIUM 600 + D PO) Take 1 tablet by mouth daily.    [provider]  ?gabapentin (NEURONTIN) 600 MG tablet TAKE 1 TABLET BY MOUTH 4 TIMES DAILY ?Patient taking differently: Take 600 mg by mouth 2 (two) times daily. 03/31/21   Susy Frizzle, MD  ?glucose blood (ONETOUCH VERIO) test strip USE AS DIRECTED TO MONITOR BLOOD SUGARS UP TO 3 TIMES DAILY AS NEEDED 02/22/21   Susy Frizzle, MD  ?HYDROcodone-acetaminophen (NORCO/VICODIN) 5-325 MG tablet TAKE 1 TABLET BY MOUTH EVERY 6 HOURS AS NEEDED FOR MODERATE PAIN.  06/03/20   Susy Frizzle, MD  ?isosorbide mononitrate (IMDUR) 60 MG 24 hr tablet TAKE 1 TABLET BY MOUTH ONCE DAILY 08/25/20   [name], MD  ?LANTUS SOLOSTAR 100 UNIT/ML Solostar Pen INJECT 40-50 UNITS UNDER THE SKIN ONCE EVERY NIGHT AT BEDTIME. 03/18/21   Susy Frizzle, MD  ?metoprolol tartrate (LOPRESSOR) 25 MG tablet Take 1 tablet (25 mg total) by mouth 2 (two) times daily. 07/22/20   [name], MD  ?Multiple Vitamin (MULTIVITAMIN) capsule Take 1 capsule by mouth daily.    [provider]  ?nitroGLYCERIN (NITROSTAT) 0.4 MG SL tablet Place 1 tablet (0.4 mg total) under the tongue every 5 (five) minutes x 3 doses as needed for chest pain. 02/29/16   [name], MD  ?Omega-3 Fatty Acids (FISH OIL) 1000 MG CAPS Take 1 capsule by mouth daily.    [provider]  ?omeprazole (PRILOSEC) 20 MG capsule Take 20 mg by mouth daily.    [provider]  ?potassium chloride (KLOR-CON) 10 MEQ tablet TAKE 1 TABLET BY MOUTH TWICE A DAY 05/11/21   Susy Frizzle, MD  ?rosuvastatin (CRESTOR) 40 MG tablet TAKE ONE TABLET BY MOUTH ONCE DAILY 08/25/20   [name], MD  ?torsemide (DEMADEX) 20 MG tablet Take 1 tablet (20 mg total) by mouth daily. 11/11/20   Susy Frizzle, MD  ? ? ?Physical Exam: ?Vitals:  ? 05/12/21 2345 05/13/21 0015 05/13/21 0159 05/13/21 0255  ?BP: 139/77 123/68 (!) 114/36 (!) 105/57  ?Pulse: 93  93 91  ?Resp: 19 (!) 21 20   ?Temp:   98.1 ?F (36.7 ?C)   ?TempSrc:   Oral   ?SpO2: 92%  96%   ? ?Constitutional: NAD, calm, comfortable ?Eyes: PERRL, lids and conjunctivae normal ?ENMT: Mucous membranes are moist. Posterior pharynx clear of any exudate or lesions.Normal dentition.  ?Neck: normal, supple, no masses, no thyromegaly ?Respiratory: clear to auscultation bilaterally, no wheezing, no crackles. Normal respiratory effort. No accessory muscle use.  ?Cardiovascular: Regular rate and rhythm, no murmurs / rubs / gallops. No extremity edema. 2+ pedal pulses. No  carotid bruits.  ?Abdomen: Diffuse TTP, no guarding no rebound ?Musculoskeletal: no clubbing / cyanosis. No joint deformity upper and lower extremities. Good ROM, no contractures. Normal muscle tone.  ?Skin: no rashes, lesions, ulcers. No induration ?Neurologic: CN 2-12 grossly intact. Sensation intact, DTR normal. Strength 5/5 in all 4.  ?Psychiatric: Normal judgment and insight. Alert and oriented x 3. Normal mood.  ? ?Data Reviewed: ? ?CT AP = uncomplicated acute sigmoid diverticulitis ?WBC 10.6k ? ?Assessment and Plan: ?* Acute diverticulitis ?Uncomplicated acute diverticulitis on CT. ?NPO except water, ice chips, meds ?IVF: LR at 100 ?ABx: flagyl, and will switch cipro to rocephin with next dose at noon today ?Morphine prn pain ?F/u with  GI after resolution re: possible need for colonoscopy. ? ?Type 2 diabetes mellitus treated without insulin ([place]) ?SSI sensitive Q4H for the moment ? ?Essential hypertension ?Med rec pending at this time. ? ?OSA treated with BiPAP ?BIPAP per home settings ? ? ? ? ? Advance Care Planning:   Code Status: Full Code ? ?Consults: None ? ?Family Communication: Family at bedside ? ?Severity of Illness: ?The appropriate patient status for this patient is OBSERVATION. Observation status is judged to be reasonable and necessary in order to provide the required intensity of service to ensure the patient's safety. The patient's presenting symptoms, physical exam findings, and initial radiographic and laboratory data in the context of their medical condition is felt to place them at decreased risk for further clinical deterioration. Furthermore, it is anticipated that the patient will be medically stable for discharge from the hospital within 2 midnights of admission.  ? ?Author: ?Etta Quill., DO ?05/13/2021 4:07 AM ? ?For on call review [email or web address].  ?

## 2021-05-13 NOTE — ED Notes (Signed)
Called [facility] to transport patient to [facility] room # [phone] ?

## 2021-05-14 DIAGNOSIS — K5792 Diverticulitis of intestine, part unspecified, without perforation or abscess without bleeding: Secondary | ICD-10-CM | POA: Diagnosis not present

## 2021-05-14 LAB — GLUCOSE, CAPILLARY
Glucose-Capillary: 101 mg/dL — ABNORMAL HIGH (ref 70–99)
Glucose-Capillary: 101 mg/dL — ABNORMAL HIGH (ref 70–99)
Glucose-Capillary: 112 mg/dL — ABNORMAL HIGH (ref 70–99)
Glucose-Capillary: 114 mg/dL — ABNORMAL HIGH (ref 70–99)
Glucose-Capillary: 148 mg/dL — ABNORMAL HIGH (ref 70–99)
Glucose-Capillary: 97 mg/dL (ref 70–99)

## 2021-05-14 LAB — CBC
HCT: 39.9 % (ref 36.0–46.0)
Hemoglobin: 12.5 g/dL (ref 12.0–15.0)
MCH: 30.2 pg (ref 26.0–34.0)
MCHC: 31.3 g/dL (ref 30.0–36.0)
MCV: 96.4 fL (ref 80.0–100.0)
Platelets: 146 10*3/uL — ABNORMAL LOW (ref 150–400)
RBC: 4.14 MIL/uL (ref 3.87–5.11)
RDW: 13.9 % (ref 11.5–15.5)
WBC: 10.2 10*3/uL (ref 4.0–10.5)
nRBC: 0 % (ref 0.0–0.2)

## 2021-05-14 LAB — PHOSPHORUS: Phosphorus: 2.8 mg/dL (ref 2.5–4.6)

## 2021-05-14 LAB — BASIC METABOLIC PANEL
Anion gap: 4 — ABNORMAL LOW (ref 5–15)
BUN: 19 mg/dL (ref 8–23)
CO2: 31 mmol/L (ref 22–32)
Calcium: 9 mg/dL (ref 8.9–10.3)
Chloride: 103 mmol/L (ref 98–111)
Creatinine, Ser: 0.88 mg/dL (ref 0.44–1.00)
GFR, Estimated: >60 mL/min (ref >60)
Glucose, Bld: 99 mg/dL (ref 70–99)
Potassium: 4.1 mmol/L (ref 3.5–5.1)
Sodium: 138 mmol/L (ref 135–145)

## 2021-05-14 LAB — MAGNESIUM: Magnesium: 2.6 mg/dL — ABNORMAL HIGH (ref 1.7–2.4)

## 2021-05-14 NOTE — Plan of Care (Signed)
Pt having difficulty sleeping last night. Says she feels restless. ?Problem: Education: ?Goal: Knowledge of General Education information will improve ?Description: Including pain rating scale, medication(s)/side effects and non-pharmacologic comfort measures ?Outcome: Progressing ?  ?Problem: Health Behavior/Discharge Planning: ?Goal: Ability to manage health-related needs will improve ?Outcome: Progressing ?  ?Problem: Clinical Measurements: ?Goal: Ability to maintain clinical measurements within normal limits will improve ?Outcome: Progressing ?Goal: Will remain free from infection ?Outcome: Progressing ?Goal: Diagnostic test results will improve ?Outcome: Progressing ?Goal: Respiratory complications will improve ?Outcome: Progressing ?Goal: Cardiovascular complication will be avoided ?Outcome: Progressing ?  ?Problem: Activity: ?Goal: Risk for activity intolerance will decrease ?Outcome: Progressing ?  ?Problem: Nutrition: ?Goal: Adequate nutrition will be maintained ?Outcome: Progressing ?  ?Problem: Coping: ?Goal: Level of anxiety will decrease ?Outcome: Progressing ?  ?Problem: Elimination: ?Goal: Will not experience complications related to bowel motility ?Outcome: Progressing ?Goal: Will not experience complications related to urinary retention ?Outcome: Progressing ?  ?Problem: Pain Managment: ?Goal: General experience of comfort will improve ?Outcome: Progressing ?  ?Problem: Safety: ?Goal: Ability to remain free from injury will improve ?Outcome: Progressing ?  ?Problem: Skin Integrity: ?Goal: Risk for impaired skin integrity will decrease ?Outcome: Progressing ?  ?

## 2021-05-14 NOTE — Plan of Care (Signed)

## 2021-05-14 NOTE — Hospital Course (Addendum)
This 81 years old female with PMH significant for hypertension, diabetes, OSA presented in the ED with complaints of left lower quadrant abdominal pain that started one day before admission associated with nausea and vomiting.  She denies any fever or chills.  She reports no similar symptoms in the past. She denies any prior abdominal surgeries.  CT abdominal showed uncomplicated sigmoid diverticulitis without abscess.  Patient started on ceftriaxone and metronidazole.  Patient reports slight improvement.  Started on clear liquid diet, tolerated well advance diet to full liquid to soft diet.  She feels better want to be discharged.  Patient is being discharged home on oral antibiotics. ?

## 2021-05-14 NOTE — Progress Notes (Signed)
?  Progress Note ? ? ?PatientFidencia Mccloud [name] UEK:[id number] DOB: 02-03-1941 DOA: 05/12/2021     1 ? ?DOS: the patient was seen and examined on 05/14/2021 ?  ?Brief hospital course: ?This 81 years old female with PMH significant for hypertension, diabetes, OSA presented in the ED with complaints of left lower quadrant abdominal pain that started yesterday associated with nausea and vomiting.  She denies any fever or chills.  She reports no similar symptoms in the past.  She denies any prior abdominal surgeries.  CT abdominal shows uncomplicated sigmoid diverticulitis without abscess.  Patient started on ceftriaxone and metronidazole.  Patient reports slight improvement.  Started on clear liquid diet. ? ?Assessment and Plan: ?* Acute diverticulitis ?Patient presented with a left lower quadrant abdominal pain associated with nausea and vomiting. ?CT abdomen pelvis showed uncomplicated acute diverticulitis. ?Kept n.p.o. with meds. ?Continue IV hydration. ?IV antibiotics ceftriaxone and metronidazole. ?Patient reports slight improvement and started on clear liquid diet. ?Continue adequate pain control with morphine. ?Follow-up with GI after resolution for possible colonoscopy ? ?Type 2 diabetes mellitus treated without insulin ([place]) ?Continue regular insulin sliding scale. ? ?Essential hypertension ?Resume home blood pressure medications. ? ?OSA treated with BiPAP ?Continue BiPAP. ? ? ? ?Subjective: Patient was seen and examined at bedside.  Overnight events noted.   ?Patient reports slight improvement but she still has left lower quadrant abdominal pain.   ?She reports feeling hungry and wants to eat. ? ?Physical Exam: ?Vitals:  ? 05/13/21 1411 05/13/21 2057 05/14/21 0014 05/14/21 0416  ?BP: (!) 106/37 116/71  (!) 115/43  ?Pulse: 88 84  74  ?Resp: '20 20  18  '$ ?Temp: 98.4 ?F (36.9 ?C) 97.9 ?F (36.6 ?C)  97.7 ?F (36.5 ?C)  ?TempSrc:  Oral  Oral  ?SpO2: 98% 97% 91% 97%  ? ?General exam: Appears comfortable, not in any acute  distress.  Deconditioned ?Respiratory system: CTA bilaterally, no wheezing, no crackles, respiratory for normal. ?Cardiovascular system: S1-S2 heard, regular rate and rhythm, no murmur. ?Gastrointestinal system: Abdomen is soft, LLQ tenderness+ bowel sounds+. ?Central nervous system: Alert, oriented x3, no focal neurological deficits. ?Extremities: No edema, no cyanosis, no clubbing ?Psychiatry: Mood insight, judgment normal ? ?Data Reviewed: ?I have Reviewed nursing notes, Vitals, and Lab results since pt's last encounter. Pertinent lab results CBC, BMP, mag, Phos ?I have ordered test including CBC, BMP ?I have independently visualized and interpreted imaging CT abdomen pelvis which showed uncomplicated sigmoid diverticulitis. ?I have reviewed the last note from hospitalist,  ?I have discussed pt's care plan and test results with patient.  ? ?Family Communication: No family at bedside ? ?Disposition: ?Status is: Inpatient ?Remains inpatient appropriate because:  ?Admitted for acute sigmoid diverticulitis requiring IV antibiotics and pain control. ?Anticipated discharge home in 1 to 2 days. ? ? Planned Discharge Destination: Home ? ? ? ?Time spent: 50 minutes ? ?Author: ?Shawna Clamp, MD ?05/14/2021 10:55 AM ? ?For on call review [email or web address]  ?

## 2021-05-15 DIAGNOSIS — K5792 Diverticulitis of intestine, part unspecified, without perforation or abscess without bleeding: Secondary | ICD-10-CM | POA: Diagnosis not present

## 2021-05-15 LAB — CBC
HCT: 39.9 % (ref 36.0–46.0)
Hemoglobin: 12.5 g/dL (ref 12.0–15.0)
MCH: 30 pg (ref 26.0–34.0)
MCHC: 31.3 g/dL (ref 30.0–36.0)
MCV: 95.7 fL (ref 80.0–100.0)
Platelets: 151 10*3/uL (ref 150–400)
RBC: 4.17 MIL/uL (ref 3.87–5.11)
RDW: 13.6 % (ref 11.5–15.5)
WBC: 7.8 10*3/uL (ref 4.0–10.5)
nRBC: 0 % (ref 0.0–0.2)

## 2021-05-15 LAB — GLUCOSE, CAPILLARY
Glucose-Capillary: 103 mg/dL — ABNORMAL HIGH (ref 70–99)
Glucose-Capillary: 108 mg/dL — ABNORMAL HIGH (ref 70–99)
Glucose-Capillary: 139 mg/dL — ABNORMAL HIGH (ref 70–99)
Glucose-Capillary: 146 mg/dL — ABNORMAL HIGH (ref 70–99)
Glucose-Capillary: 93 mg/dL (ref 70–99)
Glucose-Capillary: 97 mg/dL (ref 70–99)

## 2021-05-15 MED ORDER — ROSUVASTATIN CALCIUM 10 MG PO TABS
40.0000 mg | ORAL_TABLET | Freq: Every day | ORAL | Status: DC
  Administered 2021-05-15 – 2021-05-16 (×2): 40 mg via ORAL
  Filled 2021-05-15 (×2): qty 4

## 2021-05-15 MED ORDER — GABAPENTIN 300 MG PO CAPS
600.0000 mg | ORAL_CAPSULE | Freq: Two times a day (BID) | ORAL | Status: DC
Start: 2021-05-15 — End: 2021-05-16
  Administered 2021-05-15 – 2021-05-16 (×3): 600 mg via ORAL
  Filled 2021-05-15 (×3): qty 2

## 2021-05-15 MED ORDER — ISOSORBIDE MONONITRATE ER 60 MG PO TB24
60.0000 mg | ORAL_TABLET | Freq: Every day | ORAL | Status: DC
Start: 2021-05-15 — End: 2021-05-16
  Administered 2021-05-15 – 2021-05-16 (×2): 60 mg via ORAL
  Filled 2021-05-15 (×2): qty 1

## 2021-05-15 MED ORDER — PANTOPRAZOLE SODIUM 40 MG PO TBEC
40.0000 mg | DELAYED_RELEASE_TABLET | Freq: Every day | ORAL | Status: DC
  Administered 2021-05-15 – 2021-05-16 (×2): 40 mg via ORAL
  Filled 2021-05-15 (×2): qty 1

## 2021-05-15 MED ORDER — TORSEMIDE 20 MG PO TABS
20.0000 mg | ORAL_TABLET | Freq: Every day | ORAL | Status: DC
Start: 2021-05-15 — End: 2021-05-16
  Administered 2021-05-15 – 2021-05-16 (×2): 20 mg via ORAL
  Filled 2021-05-15 (×2): qty 1

## 2021-05-15 MED ORDER — METOPROLOL TARTRATE 25 MG PO TABS
25.0000 mg | ORAL_TABLET | Freq: Two times a day (BID) | ORAL | Status: DC
  Administered 2021-05-15 – 2021-05-16 (×3): 25 mg via ORAL
  Filled 2021-05-15 (×3): qty 1

## 2021-05-15 NOTE — Progress Notes (Signed)
?  Progress Note ? ? ?PatientTaneshia Lorence [name] GNF:[id number] DOB: [birth year] DOA: 05/12/2021     2 ? ?DOS: the patient was seen and examined on 05/15/2021 ?  ?Brief hospital course: ?This 81 years old female with PMH significant for hypertension, diabetes, OSA presented in the ED with complaints of left lower quadrant abdominal pain that started one day before associated with nausea and vomiting.  She denies any fever or chills.  She reports no similar symptoms in the past. She denies any prior abdominal surgeries.  CT abdominal showed uncomplicated sigmoid diverticulitis without abscess.  Patient started on ceftriaxone and metronidazole.  Patient reports slight improvement.  Started on clear liquid diet. ? ?Assessment and Plan: ?* Acute diverticulitis ?Patient presented with left lower quadrant abdominal pain associated with nausea and vomiting. ?CT abdomen pelvis showed uncomplicated acute diverticulitis. ?Continue IV hydration.  Continue IV Zofran as needed for nausea vomiting ?Continue IV antibiotics( ceftriaxone and metronidazole). ?Patient reports slight improvement and started on clear liquid diet. ?Continue adequate pain control with morphine. ?Follow-up with GI after resolution for possible colonoscopy ? ?Type 2 diabetes mellitus treated without insulin ([place]) ?Continue regular insulin sliding scale. ? ?Essential hypertension ?Continue metoprolol and Imdur. ? ?OSA treated with BiPAP ?Continue BiPAP at night and as needed. ? ? ? ?Subjective: Patient was seen and examined at bedside.  Overnight events noted.   ?Patient reports improvement in the left lower quadrant pain but feeling nauseous denies any vomiting. ?She reports she tolerated clear liquid diet but feeling nauseous since work-up. ? ?Physical Exam: ?Vitals:  ? 05/14/21 0416 05/14/21 1402 05/14/21 2013 05/15/21 0335  ?BP: (!) 115/43 (!) 129/58 (!) 128/59 (!) 146/66  ?Pulse: 74 76 77 93  ?Resp: '18 14 20 20  '$ ?Temp: 97.7 ?F (36.5 ?C) 98.3 ?F (36.8 ?C) 98.1 ?F  (36.7 ?C) 97.9 ?F (36.6 ?C)  ?TempSrc: Oral Oral Oral   ?SpO2: 97% 93% 92% 95%  ? ?General exam: Appears comfortable, not in any acute distress.  Deconditioned. ?Respiratory system: CTA bilaterally, no wheezing, no crackles, respiratory for normal. ?Cardiovascular system: S1-S2 heard, regular rate and rhythm, no murmur. ?Gastrointestinal system: Abdominal soft, non tender, non distended, BS+ ?Central nervous system: Alert, oriented x 3, no focal neurological deficits. ?Extremities: No edema, no cyanosis, no clubbing ?Psychiatry: Mood insight, judgment normal ? ?Data Reviewed: ?I have Reviewed nursing notes, Vitals, and Lab results since pt's last encounter. Pertinent lab results CBC, BMP ?I have ordered test including CBC, BMP ?I have reviewed the last note from hospitalist,  ?I have discussed pt's care plan and test results with patient.  ? ?Family Communication: No family at bedside ? ?Disposition: ?Status is: Inpatient ?Remains inpatient appropriate because:  ?Admitted for acute sigmoid diverticulitis requiring IV antibiotics and pain control. ?Anticipated discharge home in 1 to 2 days. ? ? Planned Discharge Destination: Home ? ? ? ? ?Time spent:35 minutes ? ?Author: ?Shawna Clamp, MD ?05/15/2021 11:18 AM ? ?For on call review [email or web address].  ?

## 2021-05-15 NOTE — Progress Notes (Signed)
Patient reports no relief of nausea from Zofran given. Patient states she has been nauseated since waking up this morning and says clear liquids at breakfast did not help or make nausea worse. Patient requests BiPAP be placed for her to take a nap. Family at bedside.  ?

## 2021-05-16 LAB — BASIC METABOLIC PANEL
Anion gap: 7 (ref 5–15)
BUN: 6 mg/dL — ABNORMAL LOW (ref 8–23)
CO2: 34 mmol/L — ABNORMAL HIGH (ref 22–32)
Calcium: 9.3 mg/dL (ref 8.9–10.3)
Chloride: 98 mmol/L (ref 98–111)
Creatinine, Ser: 0.79 mg/dL (ref 0.44–1.00)
GFR, Estimated: >60 mL/min (ref >60)
Glucose, Bld: 150 mg/dL — ABNORMAL HIGH (ref 70–99)
Potassium: 4 mmol/L (ref 3.5–5.1)
Sodium: 139 mmol/L (ref 135–145)

## 2021-05-16 LAB — PHOSPHORUS: Phosphorus: 3.4 mg/dL (ref 2.5–4.6)

## 2021-05-16 LAB — CBC
HCT: 41.9 % (ref 36.0–46.0)
Hemoglobin: 13.3 g/dL (ref 12.0–15.0)
MCH: 30 pg (ref 26.0–34.0)
MCHC: 31.7 g/dL (ref 30.0–36.0)
MCV: 94.4 fL (ref 80.0–100.0)
Platelets: 192 10*3/uL (ref 150–400)
RBC: 4.44 MIL/uL (ref 3.87–5.11)
RDW: 13.3 % (ref 11.5–15.5)
WBC: 6.8 10*3/uL (ref 4.0–10.5)
nRBC: 0 % (ref 0.0–0.2)

## 2021-05-16 LAB — GLUCOSE, CAPILLARY
Glucose-Capillary: 122 mg/dL — ABNORMAL HIGH (ref 70–99)
Glucose-Capillary: 142 mg/dL — ABNORMAL HIGH (ref 70–99)
Glucose-Capillary: 122 mg/dL — ABNORMAL HIGH (ref 70–99)

## 2021-05-16 LAB — MAGNESIUM: Magnesium: 2 mg/dL (ref 1.7–2.4)

## 2021-05-16 MED ORDER — METRONIDAZOLE 500 MG PO TABS
500.0000 mg | ORAL_TABLET | Freq: Two times a day (BID) | ORAL | 0 refills | Status: AC
Start: 2021-05-16 — End: 2021-05-22

## 2021-05-16 MED ORDER — CIPROFLOXACIN HCL 500 MG PO TABS: 500.0000 mg | ORAL_TABLET | Freq: Two times a day (BID) | ORAL | 0 refills | Status: AC

## 2021-05-16 MED ORDER — METRONIDAZOLE 500 MG PO TABS: 500.0000 mg | ORAL_TABLET | Freq: Three times a day (TID) | ORAL | 0 refills | Status: DC

## 2021-05-16 NOTE — Discharge Summary (Signed)
?Physician Discharge Summary ?  ?Patient: April [name] MRN: [id number] DOB: 22-Aug-1940  ?Admit date:     05/12/2021  ?Discharge date: 05/16/21  ?Discharge Physician: Shawna Clamp  ? ?PCP: Susy Frizzle, MD  ? ?Recommendations at discharge:  ?Advised to follow-up with primary care physician in 1 week. ?Advised to follow-up with gastroenterology after completion of antibiotics. ?Advised to take ciprofloxacin 500 twice daily and metronidazole 500 two times daily for 6 days to complete 10-day treatment for diverticulitis. ? ? ?Discharge Diagnoses: ?Principal Problem: ?  Acute diverticulitis ?Active Problems: ?  OSA treated with BiPAP ?  Essential hypertension ?  Type 2 diabetes mellitus treated without insulin ([place]) ? ?Resolved Problems: ?  * No resolved hospital problems. * ? ?Hospital Course: ?This 81 years old female with PMH significant for hypertension, diabetes, OSA presented in the ED with complaints of left lower quadrant abdominal pain that started one day before admission associated with nausea and vomiting.  She denies any fever or chills.  She reports no similar symptoms in the past. She denies any prior abdominal surgeries.  CT abdominal showed uncomplicated sigmoid diverticulitis without abscess.  Patient started on ceftriaxone and metronidazole.  Patient reports slight improvement.  Started on clear liquid diet, tolerated well advance diet to full liquid to soft diet.  She feels better want to be discharged.  Patient is being discharged home on oral antibiotics. ? ?Assessment and Plan: ?* Acute diverticulitis ?Patient presented with left lower quadrant abdominal pain associated with nausea and vomiting. ?CT abdomen pelvis showed uncomplicated acute diverticulitis. ?Continued IV hydration.  Continued IV Zofran as needed for nausea vomiting. ?Continued IV antibiotics ( ceftriaxone and metronidazole). ?Continue adequate pain control with morphine. ?Patient feels very much improved.  Patient being  discharged home on oral antibiotics. ?Follow-up with GI after resolution for possible colonoscopy ? ?Type 2 diabetes mellitus treated without insulin ([place]) ?Continue regular insulin sliding scale. ? ?Essential hypertension ?Continue metoprolol and Imdur. ? ?OSA treated with BiPAP ?Continue BiPAP at night and as needed. ? ? ?Pain control - [employer] Controlled Substance Reporting System database was reviewed. and patient was instructed, not to drive, operate heavy machinery, perform activities at heights, swimming or participation in water activities or provide baby-sitting services while on Pain, Sleep and Anxiety Medications; until their outpatient Physician has advised to do so again. Also recommended to not to take more than prescribed Pain, Sleep and Anxiety Medications.  ? ?Consultants: None ? ?Procedures performed: CT A/P  ? ?Disposition: Home ? ?Diet recommendation:  ?Discharge Diet Orders (From admission, onward)  ? ?  Start     Ordered  ? 05/16/21 0000  Diet - low sodium heart healthy       ? 05/16/21 0931  ? 05/16/21 0000  Diet Carb Modified       ? 05/16/21 0931  ? ?  ?  ? ?  ? ?Cardiac diet ?DISCHARGE MEDICATION: ?Allergies as of 05/16/2021   ? ?   Reactions  ? Neomycin Other (See Comments)  ? Visual disturbance and swelling in eyes  ? ?  ? ?  ?Medication List  ?  ? ?STOP taking these medications   ? ?nitroGLYCERIN 0.4 MG SL tablet ?Commonly known as: NITROSTAT ?  ? ?  ? ?TAKE these medications   ? ?CALCIUM 600 + D PO ?Take 1 tablet by mouth daily. ?  ?ciprofloxacin 500 MG tablet ?Commonly known as: Cipro ?Take 1 tablet (500 mg total) by mouth 2 (two) times daily  for 6 days. ?  ?Fish Oil 1000 MG Caps ?Take 1 capsule by mouth daily. ?  ?gabapentin 600 MG tablet ?Commonly known as: NEURONTIN ?TAKE 1 TABLET BY MOUTH 4 TIMES DAILY ?What changed: when to take this ?  ?isosorbide mononitrate 60 MG 24 hr tablet ?Commonly known as: IMDUR ?TAKE 1 TABLET BY MOUTH ONCE DAILY ?  ?Lantus SoloStar 100 UNIT/ML  Solostar Pen ?Generic drug: insulin glargine ?INJECT 40-50 UNITS UNDER THE SKIN ONCE EVERY NIGHT AT BEDTIME. ?What changed: See the new instructions. ?  ?metoprolol tartrate 25 MG tablet ?Commonly known as: LOPRESSOR ?Take 1 tablet (25 mg total) by mouth 2 (two) times daily. ?  ?metroNIDAZOLE 500 MG tablet ?Commonly known as: Flagyl ?Take 1 tablet (500 mg total) by mouth 2 (two) times daily for 6 days. ?  ?multivitamin capsule ?Take 1 capsule by mouth daily. ?  ?omeprazole 20 MG capsule ?Commonly known as: PRILOSEC ?Take 20 mg by mouth daily. ?  ?OneTouch Verio test strip ?Generic drug: glucose blood ?USE AS DIRECTED TO MONITOR BLOOD SUGARS UP TO 3 TIMES DAILY AS NEEDED ?  ?potassium chloride 10 MEQ tablet ?Commonly known as: KLOR-CON ?TAKE 1 TABLET BY MOUTH TWICE A DAY ?  ?rosuvastatin 40 MG tablet ?Commonly known as: CRESTOR ?TAKE ONE TABLET BY MOUTH ONCE DAILY ?  ?torsemide 20 MG tablet ?Commonly known as: DEMADEX ?Take 1 tablet (20 mg total) by mouth daily. ?  ? ?  ? ? Follow-up Information   ? ? Susy Frizzle, MD Follow up in 1 week(s).   ?Specialty: Family Medicine ?Contact information: ?[address] ?[place] [address] ?[phone] ? ? ?  ?  ? ? [name], MD .   ?Specialty: Cardiology ?Contact information: ?[address] ?[address] ?[place] [address] ?[phone] ? ? ?  ?  ? ? [facility] Follow up.   ?Specialty: Gastroenterology ?Contact information: ?[address] ?[place] [address] ?[phone] ? ?  ?  ? ?  ?  ? ?  ? ?Discharge Exam: ?There were no vitals filed for this visit. ? ?General exam: Appears comfortable, not in any acute distress. ?Respiratory system: CTA bilaterally, no wheezing, no crackles. ?Cardiovascular system: S1-S2 heard, regular rate and rhythm, no murmur. ?Gastrointestinal system: Abdomen is soft, nontender, nondistended, BS+. ?Central nervous system: Alert, oriented x3, no focal neurological deficits. ?Extremities: No  edema, no cyanosis, no clubbing. ?Psychiatry: Mood, insight, judgment normal. ? ? ? ? ? ?Condition at discharge: good ? ?The results of significant diagnostics from this hospitalization (including imaging, microbiology, ancillary and laboratory) are listed below for reference.  ? ?Imaging Studies: ?CT ABDOMEN PELVIS W CONTRAST ? ?Result Date: 05/12/2021 ?CLINICAL DATA:  Nausea/vomiting EXAM: CT ABDOMEN AND PELVIS WITH CONTRAST TECHNIQUE: Multidetector CT imaging of the abdomen and pelvis was performed using the standard protocol following bolus administration of intravenous contrast. RADIATION DOSE REDUCTION: This exam was performed according to the departmental dose-optimization program which includes automated exposure control, adjustment of the mA and/or kV according to patient size and/or use of iterative reconstruction technique. CONTRAST:  16m OMNIPAQUE IOHEXOL 300 MG/ML  SOLN COMPARISON:  None. FINDINGS: Lower chest: Coronary calcification.  Moderate volume hiatal hernia. Hepatobiliary: No focal liver abnormality. Calcified gallstone noted within the gallbladder lumen. No gallbladder wall thickening or pericholecystic fluid. No biliary dilatation. Pancreas: No focal lesion. Normal pancreatic contour. No surrounding inflammatory changes. No main pancreatic ductal dilatation. Spleen: Normal in size without focal abnormality. Adrenals/Urinary Tract: No adrenal nodule bilaterally. Bilateral  kidneys enhance symmetrically. Subcentimeter hypodensities are too small to characterize. No hydronephrosis. No hydroureter. The urinary bladder is unremarkable. Stomach/Bowel: Stomach is within normal limits. No evidence of small bowel wall thickening or dilatation. Diffuse sigmoid and scattered colonic diverticulosis. Sigmoid bowel wall thickening with pericolonic fat stranding. No definite intramural abscess formation. Appendix appears normal. Vascular/Lymphatic: No abdominal aorta or iliac aneurysm. Severe atherosclerotic  plaque of the aorta and its branches. No abdominal, pelvic, or inguinal lymphadenopathy. Reproductive: Uterus and bilateral adnexa are unremarkable. Other: No intraperitoneal free fluid. No intraperitoneal free

## 2021-05-16 NOTE — Discharge Instructions (Addendum)
Advised to follow-up with primary care physician in 1 week. ?Advised to follow-up with gastroenterology after completion of antibiotic. ?Advised to take ciprofloxacin 500 twice daily and metronidazole 503 times daily for 6 days to complete 10-day treatment for diverticulitis. ?

## 2021-05-17 ENCOUNTER — Telehealth: Payer: Self-pay

## 2021-05-17 NOTE — Telephone Encounter (Signed)
Transition Care Management Follow-up Telephone Call ?Date of discharge and from where: 05/16/2021 April [name]. ?How have you been since you were released from the hospital? Spoke with pt's daughter in law, Ebony Hail and she states pt is doing well.  ?Any questions or concerns? No ? ?Items Reviewed: ?Did the pt receive and understand the discharge instructions provided? Yes  ?Medications obtained and verified? Yes  ?Other? No  ?Any new allergies since your discharge? No  ?Dietary orders reviewed? Yes ?Do you have support at home? Yes  ? ?Home Care and Equipment/Supplies: ?Were home health services ordered? no ?If so, what is the name of the agency? N/A  ?Has the agency set up a time to come to the patient's home? not applicable ?Were any new equipment or medical supplies ordered?  No ?What is the name of the medical supply agency? N/A ?Were you able to get the supplies/equipment? not applicable ?Do you have any questions related to the use of the equipment or supplies? No ? ?Functional Questionnaire: (I = Independent and D = Dependent) ?ADLs: I ? ?Bathing/Dressing- I ? ?Meal Prep- I ? ?Eating- I ? ?Maintaining continence- I ? ?Transferring/Ambulation- I ? ?Managing Meds- I ? ?Follow up appointments reviewed: ? ?PCP Hospital f/u appt confirmed? Yes  Scheduled to see Dr. Dennard Schaumann on 05/23/21 @ 2:15. ?[facility] f/u appt confirmed? No  Follow up needed with Dr. Selena Batten and [facility]. ?Are transportation arrangements needed? No  ?If their condition worsens, is the pt aware to call PCP or go to the Emergency Dept.? Yes ?Was the patient provided with contact information for the PCP's office or ED? Yes ?Was to pt encouraged to call back with questions or concerns? Yes ? ?

## 2021-05-21 ENCOUNTER — Other Ambulatory Visit: Payer: Self-pay | Admitting: Cardiology

## 2021-05-21 ENCOUNTER — Other Ambulatory Visit: Payer: Self-pay | Admitting: Family Medicine

## 2021-05-23 ENCOUNTER — Ambulatory Visit (INDEPENDENT_AMBULATORY_CARE_PROVIDER_SITE_OTHER): Payer: Medicare Other | Admitting: Family Medicine

## 2021-05-23 VITALS — BP 110/70 | HR 71 | Temp 97.2°F | Ht 61.0 in | Wt 155.4 lb

## 2021-05-23 DIAGNOSIS — E119 Type 2 diabetes mellitus without complications: Secondary | ICD-10-CM | POA: Diagnosis not present

## 2021-05-23 DIAGNOSIS — K5792 Diverticulitis of intestine, part unspecified, without perforation or abscess without bleeding: Secondary | ICD-10-CM

## 2021-05-23 DIAGNOSIS — E038 Other specified hypothyroidism: Secondary | ICD-10-CM | POA: Diagnosis not present

## 2021-05-23 NOTE — Progress Notes (Signed)
? ?Subjective:  ? ? Patient ID: April [name], female    DOB: [birth year], 81 y.o.   MRN: [id number] ? ?Discharge Diagnoses: ?Principal Problem: ?  Acute diverticulitis ?Active Problems: ?  OSA treated with BiPAP ?  Essential hypertension ?  Type 2 diabetes mellitus treated without insulin ([place]) ?  ?Resolved Problems: ?  * No resolved hospital problems. * ?  ?Hospital Course: ?This 81 years old female with PMH significant for hypertension, diabetes, OSA presented in the ED with complaints of left lower quadrant abdominal pain that started one day before admission associated with nausea and vomiting.  She denies any fever or chills.  She reports no similar symptoms in the past. She denies any prior abdominal surgeries.  CT abdominal showed uncomplicated sigmoid diverticulitis without abscess.  Patient started on ceftriaxone and metronidazole.  Patient reports slight improvement.  Started on clear liquid diet, tolerated well advance diet to full liquid to soft diet.  She feels better want to be discharged.  Patient is being discharged home on oral antibiotics. ?  ?Assessment and Plan: ?* Acute diverticulitis ?Patient presented with left lower quadrant abdominal pain associated with nausea and vomiting. ?CT abdomen pelvis showed uncomplicated acute diverticulitis. ?Continued IV hydration.  Continued IV Zofran as needed for nausea vomiting. ?Continued IV antibiotics ( ceftriaxone and metronidazole). ?Continue adequate pain control with morphine. ?Patient feels very much improved.  Patient being discharged home on oral antibiotics. ?Follow-up with GI after resolution for possible colonoscopy ?  ?Type 2 diabetes mellitus treated without insulin ([place]) ?Continue regular insulin sliding scale. ?  ?Essential hypertension ?Continue metoprolol and Imdur. ?  ?OSA treated with BiPAP ?Continue BiPAP at night and as needed. ?  ?  ?Pain control - [employer] Controlled Substance Reporting System database was reviewed. and patient  was instructed, not to drive, operate heavy machinery, perform activities at heights, swimming or participation in water activities or provide baby-sitting services while on Pain, Sleep and Anxiety Medications; until their outpatient Physician has advised to do so again. Also recommended to not to take more than prescribed Pain, Sleep and Anxiety Medications.  ?  ?Consultants: None ?  ?Procedures performed: CT A/P  ?  ?Disposition: Home ?  ? ?05/23/21 ?Patient is a very pleasant 81 year old Caucasian female who was recently admitted with diverticulitis.  She states the abdominal pain has improved and resolved after taking antibiotics.  She has developed some diarrhea after her hospitalization.  She states that this is gradually getting better.  When she was in the hospital she was having occasional bouts of fecal incontinence.  However she is now having 1-2 bowel movements a day.  They are watery but they are definitely less intense.  She denies any blood in her stool.  She denies any abdominal pain.  She denies any angina or chest pain or shortness of breath or dyspnea on exertion.  She does have trace pitting edema in both ankles denies any shortness of breath or signs of fluid overload.  She questions about doing a colonoscopy is recommended in the discharge summary.  I would like to see her completely recovered before we consider doing this due to the risk of perforation.  We also discussed possibly doing a Cologuard instead given her age and her risk factors for anesthesia including her heart and her breathing.  She is due to check an A1c along with a TSH.  Otherwise she is doing well. ? ?Past Medical History:  ?Diagnosis Date  ? Abnormal weight gain   ?  Arthritis   ? "joints" (06/13/2013)  ? COPD (chronic obstructive pulmonary disease) ([place])   ? Coronary artery disease, non-occlusive 06/2013  ? Cardiac Cath: sharp Angle take-off of Large Dominant Cx: ~70-80% mid Cx bifurcation lesion (Lateral OM &  AVGCx-PL-PDA  both with hairpin ostial & ~50% lesions) - Not PCI amenable due to vessel tortuosity; ~40-50% mid LAD;[place] - no significnat diseaes; small non-dominant RCA  ? Diverticulosis   ? GERD (gastroesophageal reflux disease)   ? History of stress test   ? a. 09/2006 nl Dobutamine Echo  ? Hyperlipidemia   ? Hypertension   ? NIDDM (non-insulin dependent diabetes mellitus)   ? On home oxygen therapy   ? "2L q hs; runs into my BIPAP" (06/13/2013)  ? OSA (obstructive sleep apnea)   ? "BIPAP w/O2" (06/13/2013)  ? Tracheobronchomalacia   ? a. followed by  Pulm.  ? ?Past Surgical History:  ?Procedure Laterality Date  ? CARDIAC CATHETERIZATION  06/2013  ? Cardiac Cath: sharp Angle take-off of Large Dominant Cx: ~70-80% mid Cx bifurcation lesion (Lateral OM &  AVGCx-PL-PDA both with hairpin ostial & ~50% lesions) - Not PCI amenable due to vessel tortuosity; ~40-50% mid LAD;[place] - no significnat diseaes; small non-dominant RCA  ? LEFT HEART CATHETERIZATION WITH CORONARY ANGIOGRAM N/A 06/16/2013  ? Procedure: LEFT HEART CATHETERIZATION WITH CORONARY ANGIOGRAM;  Surgeon: [name], MD;  Location: [facility] CATH LAB;  Service: Cardiovascular;  Laterality: N/A;  ? TRANSTHORACIC ECHOCARDIOGRAM  06/17/2013  ? Normal LV size and function. EF 55-60% with no regional WMA. Grade 1 diastolic dysfunction. No significant valvular lesions  ? TUBAL LIGATION    ? ?Current Outpatient Medications on File Prior to Visit  ?Medication Sig Dispense Refill  ? Calcium Carbonate-Vitamin D (CALCIUM 600 + D PO) Take 1 tablet by mouth daily.    ? gabapentin (NEURONTIN) 600 MG tablet TAKE 1 TABLET BY MOUTH 4 TIMES DAILY (Patient taking differently: Take 600 mg by mouth 2 (two) times daily.) 360 tablet 1  ? glucose blood (ONETOUCH VERIO) test strip USE AS DIRECTED TO MONITOR BLOOD SUGARS UP TO 3 TIMES DAILY AS NEEDED 100 each 1  ? hydrochlorothiazide (MICROZIDE) 12.5 MG capsule TAKE 1 CAPSULE BY MOUTH ONCE DAILY 90 capsule 1  ? isosorbide mononitrate (IMDUR) 60 MG  24 hr tablet TAKE 1 TABLET BY MOUTH ONCE DAILY 90 tablet 3  ? LANTUS SOLOSTAR 100 UNIT/ML Solostar Pen INJECT 40-50 UNITS UNDER THE SKIN ONCE EVERY NIGHT AT BEDTIME. (Patient taking differently: 40-50 Units at bedtime.) 15 mL 3  ? metoprolol tartrate (LOPRESSOR) 25 MG tablet Take 1 tablet (25 mg total) by mouth 2 (two) times daily. 180 tablet 3  ? Multiple Vitamin (MULTIVITAMIN) capsule Take 1 capsule by mouth daily.    ? Omega-3 Fatty Acids (FISH OIL) 1000 MG CAPS Take 1 capsule by mouth daily.    ? omeprazole (PRILOSEC) 20 MG capsule Take 20 mg by mouth daily.    ? potassium chloride (KLOR-CON) 10 MEQ tablet TAKE 1 TABLET BY MOUTH TWICE A DAY (Patient taking differently: Take 10 mEq by mouth 2 (two) times daily.) 60 tablet 5  ? rosuvastatin (CRESTOR) 40 MG tablet TAKE ONE TABLET BY MOUTH ONCE DAILY 90 tablet 3  ? torsemide (DEMADEX) 20 MG tablet Take 1 tablet (20 mg total) by mouth daily. 30 tablet 5  ? ?No current facility-administered medications on file prior to visit.  ? ?Allergies  ?Allergen Reactions  ? Neomycin Other (See Comments)  ?  Visual disturbance  and swelling in eyes  ? ?Social History  ? ?Socioeconomic History  ? Marital status: Married  ?  Spouse name: Not on file  ? Number of children: 3  ? Years of education: Not on file  ? Highest education level: Not on file  ?Occupational History  ? Occupation: Systems analyst  ?Tobacco Use  ? Smoking status: Former  ?  Packs/day: 1.50  ?  Years: 20.00  ?  Pack years: 30.00  ?  Types: Cigarettes  ?  Quit date: 02/13/1986  ?  Years since quitting: 35.2  ? Smokeless tobacco: Never  ?Substance and Sexual Activity  ? Alcohol use: No  ? Drug use: No  ? Sexual activity: Yes  ?Other Topics Concern  ? Not on file  ?Social History Narrative  ? Lives in [place] with her husband. 3 children.  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  ? Former 30 Pk/yr Smoker (1.5 PPD x 20 yr) - quit in 1998.  ? ?Social Determinants of Health  ? ?Financial Resource Strain: Low Risk   ? Difficulty  of Paying Living Expenses: Not hard at all  ?Food Insecurity: No Food Insecurity  ? Worried About Charity fundraiser in the Last Year: Never true  ? Ran Out of Food in the Last Year: Never true  ?Trans

## 2021-05-24 LAB — CBC WITH DIFFERENTIAL/PLATELET
Absolute Monocytes: 559 cells/uL (ref 200–950)
Basophils Absolute: 28 cells/uL (ref 0–200)
Basophils Relative: 0.4 %
Eosinophils Absolute: 48 cells/uL (ref 15–500)
Eosinophils Relative: 0.7 %
HCT: 43.2 % (ref 35.0–45.0)
Hemoglobin: 13.9 g/dL (ref 11.7–15.5)
Lymphs Abs: 2353 cells/uL (ref 850–3900)
MCH: 30 pg (ref 27.0–33.0)
MCHC: 32.2 g/dL (ref 32.0–36.0)
MCV: 93.3 fL (ref 80.0–100.0)
MPV: 10.6 fL (ref 7.5–12.5)
Monocytes Relative: 8.1 %
Neutro Abs: 3912 cells/uL (ref 1500–7800)
Neutrophils Relative %: 56.7 %
Platelets: 286 10*3/uL (ref 140–400)
RBC: 4.63 10*6/uL (ref 3.80–5.10)
RDW: 12.4 % (ref 11.0–15.0)
Total Lymphocyte: 34.1 %
WBC: 6.9 10*3/uL (ref 3.8–10.8)

## 2021-05-24 LAB — COMPLETE METABOLIC PANEL WITH GFR
AG Ratio: 1.3 (calc) (ref 1.0–2.5)
ALT: 22 U/L (ref 6–29)
AST: 28 U/L (ref 10–35)
Albumin: 3.3 g/dL — ABNORMAL LOW (ref 3.6–5.1)
Alkaline phosphatase (APISO): 54 U/L (ref 37–153)
BUN: 12 mg/dL (ref 7–25)
CO2: 36 mmol/L — ABNORMAL HIGH (ref 20–32)
Calcium: 9.5 mg/dL (ref 8.6–10.4)
Chloride: 96 mmol/L — ABNORMAL LOW (ref 98–110)
Creat: 0.83 mg/dL (ref 0.60–1.00)
Globulin: 2.6 g/dL (calc) (ref 1.9–3.7)
Glucose, Bld: 243 mg/dL — ABNORMAL HIGH (ref 65–99)
Potassium: 4 mmol/L (ref 3.5–5.3)
Sodium: 142 mmol/L (ref 135–146)
Total Bilirubin: 0.3 mg/dL (ref 0.2–1.2)
Total Protein: 5.9 g/dL — ABNORMAL LOW (ref 6.1–8.1)
eGFR: 72 mL/min/{1.73_m2} (ref >=60)

## 2021-05-24 LAB — TSH: TSH: 3.71 mIU/L (ref 0.40–4.50)

## 2021-05-24 LAB — HEMOGLOBIN A1C
Hgb A1c MFr Bld: 7.4 % of total Hgb — ABNORMAL HIGH (ref <5.7)
Mean Plasma Glucose: 166 mg/dL
eAG (mmol/L): 9.2 mmol/L

## 2021-06-08 NOTE — Progress Notes (Addendum)
This the 3rd attempt in reaching the pt. It was unsuccessful. Letter sent out w/results.

## 2021-06-13 DIAGNOSIS — M47816 Spondylosis without myelopathy or radiculopathy, lumbar region: Secondary | ICD-10-CM | POA: Diagnosis not present

## 2021-06-17 ENCOUNTER — Other Ambulatory Visit: Payer: Medicare Other

## 2021-06-17 ENCOUNTER — Telehealth: Payer: Self-pay

## 2021-06-17 DIAGNOSIS — E785 Hyperlipidemia, unspecified: Secondary | ICD-10-CM | POA: Diagnosis not present

## 2021-06-17 DIAGNOSIS — E1169 Type 2 diabetes mellitus with other specified complication: Secondary | ICD-10-CM | POA: Diagnosis not present

## 2021-06-17 NOTE — Progress Notes (Unsigned)
Spoke with pt today, pt do not want to do the medication.  ? ? ?

## 2021-06-17 NOTE — Telephone Encounter (Signed)
Re lab results, Jardiance?  ? ?Pt came in for lab work today. Spoke with pt re the above medication per Dr. Dennard Schaumann advice via prev lab results. Per pt do not want to take th medication.  ? ?Will let Dr know.  ?

## 2021-06-18 ENCOUNTER — Encounter: Payer: Self-pay | Admitting: Cardiology

## 2021-06-18 LAB — LIPID PANEL
Cholesterol: 115 mg/dL (ref <200)
HDL: 50 mg/dL (ref >=50)
LDL Cholesterol (Calc): 31 mg/dL (calc)
Non-HDL Cholesterol (Calc): 65 mg/dL (calc) (ref <130)
Total CHOL/HDL Ratio: 2.3 (calc) (ref <5.0)
Triglycerides: 286 mg/dL — ABNORMAL HIGH (ref <150)

## 2021-06-18 NOTE — Assessment & Plan Note (Addendum)
Distant history now ACS/unstable angina, found to have a very significant LCx lesion but not approachable from percutaneous standpoint due to tortuosity.  Has done well medically with no real anginal symptoms.  April [name] has stable exertional dyspnea. ? ?April [name] is on low-dose Lopressor along with Imdur as anginal therapy. ?

## 2021-06-18 NOTE — Assessment & Plan Note (Signed)
Due for labs recheck by PCP soon.  Lipids been well controlled with excellent LDL levels.  Remains on rosuvastatin 40 mg daily. ? ?On Lantus insulin for diabetes, defer to PCP, should have upcoming labs to reassess A1c.  Consider SGLT2 inhibitor in the setting of diabetes, CAD and edema. ?

## 2021-06-18 NOTE — Assessment & Plan Note (Signed)
Blood pressure looks great today.  She is only on Lopressor 25 mg twice daily along with Imdur 60 mg and torsemide 20 mg daily. ?

## 2021-06-18 NOTE — Assessment & Plan Note (Signed)
Recommend continue use of BiPAP/CPAP ?

## 2021-06-18 NOTE — Assessment & Plan Note (Signed)
Not associate with PND or orthopnea.  Not relieved with worsening.  No dyspnea.  Probably some component of venous stasis. ?She has been on torsemide from her PCP.  Doing well.  However need to follow-up labs. ? ?Discussed foot elevation and potential compression stockings. ?

## 2021-06-18 NOTE — Assessment & Plan Note (Signed)
Anginal equivalent was mostly jaw pain.  Usually exertional. ? ?Well-controlled on current dose of metoprolol and Imdur. ?

## 2021-06-20 ENCOUNTER — Other Ambulatory Visit: Payer: Self-pay | Admitting: Pulmonary Disease

## 2021-06-20 ENCOUNTER — Other Ambulatory Visit: Payer: Self-pay | Admitting: Family Medicine

## 2021-06-21 NOTE — Telephone Encounter (Signed)
Requested Prescriptions  ?Pending Prescriptions Disp Refills  ?? torsemide (DEMADEX) 20 MG tablet [Pharmacy Med Name: TORSEMIDE 20 MG TAB] 90 tablet 0  ?  Sig: TAKE 1 TABLET BY MOUTH ONCE DAILY  ?  ? Cardiovascular:  Diuretics - Loop Failed - 06/20/2021  8:55 AM  ?  ?  Failed - Cl in normal range and within 180 days  ?  Chloride  ?Date Value Ref Range Status  ?05/23/2021 96 (L) 98 - 110 mmol/L Final  ?   ?  ?  Passed - K in normal range and within 180 days  ?  Potassium  ?Date Value Ref Range Status  ?05/23/2021 4.0 3.5 - 5.3 mmol/L Final  ?   ?  ?  Passed - Ca in normal range and within 180 days  ?  Calcium  ?Date Value Ref Range Status  ?05/23/2021 9.5 8.6 - 10.4 mg/dL Final  ?   ?  ?  Passed - Na in normal range and within 180 days  ?  Sodium  ?Date Value Ref Range Status  ?05/23/2021 142 135 - 146 mmol/L Final  ?   ?  ?  Passed - Cr in normal range and within 180 days  ?  Creat  ?Date Value Ref Range Status  ?05/23/2021 0.83 0.60 - 1.00 mg/dL Final  ?   ?  ?  Passed - Mg Level in normal range and within 180 days  ?  Magnesium  ?Date Value Ref Range Status  ?05/16/2021 2.0 1.7 - 2.4 mg/dL Final  ?  Comment:  ?  Performed at [facility] [address], [place], [place] [address]  ?   ?  ?  Passed - Last BP in normal range  ?  BP Readings from Last 1 Encounters:  ?05/23/21 110/70  ?   ?  ?  Passed - Valid encounter within last 6 months  ?  Recent Outpatient Visits   ?      ? 4 weeks ago Type 2 diabetes mellitus treated without insulin ([place])  ? [facility] Pickard, Cammie Mcgee, MD  ? 6 months ago Diastolic dysfunction  ? [facility] Pickard, Cammie Mcgee, MD  ? 7 months ago Leg swelling  ? [facility] Pickard, Cammie Mcgee, MD  ? 1 year ago Type 2 diabetes mellitus treated without insulin ([place])  ? [facility] Pickard, Cammie Mcgee, MD  ? 2 years ago Type 2 diabetes mellitus treated without insulin ([place])  ? [facility] Pickard,  Cammie Mcgee, MD  ?  ?  ? ?  ?  ?  ? ? ?

## 2021-07-13 ENCOUNTER — Other Ambulatory Visit: Payer: Self-pay | Admitting: Cardiology

## 2021-07-13 ENCOUNTER — Telehealth: Payer: Self-pay | Admitting: Family Medicine

## 2021-07-13 NOTE — Telephone Encounter (Signed)
Left message for patient to call back and schedule Medicare Annual Wellness Visit (AWV).   Please offer to do virtually or by telephone.  Left office number and my jabber [phone].  Last AWV:07/09/2020  Please schedule at anytime with Nurse Health Advisor.

## 2021-07-14 ENCOUNTER — Telehealth: Payer: Self-pay | Admitting: Family Medicine

## 2021-07-14 NOTE — Telephone Encounter (Signed)
Left message for patient to call back and schedule Medicare Annual Wellness Visit (AWV) in office.   If not able to come in office, please offer to do virtually or by telephone.  Left office number and my jabber [phone].  Last AWV:07/09/2020  Please schedule at anytime with Nurse Health Advisor.

## 2021-08-09 ENCOUNTER — Other Ambulatory Visit: Payer: Self-pay | Admitting: Family Medicine

## 2021-08-09 ENCOUNTER — Telehealth: Payer: Self-pay | Admitting: Family Medicine

## 2021-08-09 NOTE — Telephone Encounter (Signed)
Requested medications are due for refill today.  unsure  Requested medications are on the active medications list.  no  Last refill. 08/25/2020  Future visit scheduled.   no  Notes to clinic.  Med not on med list.    Requested Prescriptions  Pending Prescriptions Disp Refills   metFORMIN (GLUCOPHAGE) 500 MG tablet [Pharmacy Med Name: METFORMIN HCL 500 MG TAB] 180 tablet     Sig: TAKE 1 TABLET BY MOUTH TWICE (2) DAILY WITH A MEAL     Endocrinology:  Diabetes - Biguanides Failed - 08/09/2021  3:30 PM      Failed - B12 Level in normal range and within 720 days    Vitamin B-12  Date Value Ref Range Status  07/17/2013 697 211 - 911 pg/mL Final         Passed - Cr in normal range and within 360 days    Creat  Date Value Ref Range Status  05/23/2021 0.83 0.60 - 1.00 mg/dL Final         Passed - HBA1C is between 0 and 7.9 and within 180 days    Hgb A1c MFr Bld  Date Value Ref Range Status  05/23/2021 7.4 (H) <5.7 % of total Hgb Final    Comment:    For someone without known diabetes, a hemoglobin A1c value of 6.5% or greater indicates that they may have  diabetes and this should be confirmed with a follow-up  test. . For someone with known diabetes, a value <7% indicates  that their diabetes is well controlled and a value  greater than or equal to 7% indicates suboptimal  control. A1c targets should be individualized based on  duration of diabetes, age, comorbid conditions, and  other considerations. . Currently, no consensus exists regarding use of hemoglobin A1c for diagnosis of diabetes for children. .          Passed - eGFR in normal range and within 360 days    GFR, Est African American  Date Value Ref Range Status  04/06/2020 99 > OR = 60 mL/min/1.57m2 Final   GFR, Est Non African American  Date Value Ref Range Status  04/06/2020 85 > OR = 60 mL/min/1.74m2 Final   GFR, Estimated  Date Value Ref Range Status  05/16/2021 >60 >60 mL/min Final    Comment:     (NOTE) Calculated using the CKD-EPI Creatinine Equation (2021)    eGFR  Date Value Ref Range Status  05/23/2021 72 > OR = 60 mL/min/1.2m2 Final    Comment:    The eGFR is based on the CKD-EPI 2021 equation. To calculate  the new eGFR from a previous Creatinine or Cystatin C result, go to https://www.kidney.org/professionals/ kdoqi/gfr%5Fcalculator          Passed - Valid encounter within last 6 months    Recent Outpatient Visits           2 months ago Type 2 diabetes mellitus treated without insulin (HCC)   [facility] Pickard, Priscille Heidelberg, MD   8 months ago Diastolic dysfunction   [facility] Tanya Nones, Priscille Heidelberg, MD   9 months ago Leg swelling   [facility] Donita Brooks, MD   1 year ago Type 2 diabetes mellitus treated without insulin (HCC)   [facility] Donita Brooks, MD   2 years ago Type 2 diabetes mellitus treated without insulin (HCC)   [facility] Pickard, Priscille Heidelberg, MD  Passed - CBC within normal limits and completed in the last 12 months    WBC  Date Value Ref Range Status  05/23/2021 6.9 3.8 - 10.8 Thousand/uL Final   RBC  Date Value Ref Range Status  05/23/2021 4.63 3.80 - 5.10 Million/uL Final   Hemoglobin  Date Value Ref Range Status  05/23/2021 13.9 11.7 - 15.5 g/dL Final   HCT  Date Value Ref Range Status  05/23/2021 43.2 35.0 - 45.0 % Final   MCHC  Date Value Ref Range Status  05/23/2021 32.2 32.0 - 36.0 g/dL Final   [facility]  Date Value Ref Range Status  05/23/2021 30.0 27.0 - 33.0 pg Final   MCV  Date Value Ref Range Status  05/23/2021 93.3 80.0 - 100.0 fL Final   No results found for: "PLTCOUNTKUC", "LABPLAT", "POCPLA" RDW  Date Value Ref Range Status  05/23/2021 12.4 11.0 - 15.0 % Final

## 2021-08-17 DIAGNOSIS — Z6829 Body mass index (BMI) 29.0-29.9, adult: Secondary | ICD-10-CM | POA: Diagnosis not present

## 2021-08-17 DIAGNOSIS — M461 Sacroiliitis, not elsewhere classified: Secondary | ICD-10-CM | POA: Diagnosis not present

## 2021-08-17 DIAGNOSIS — M47816 Spondylosis without myelopathy or radiculopathy, lumbar region: Secondary | ICD-10-CM | POA: Diagnosis not present

## 2021-08-17 DIAGNOSIS — G629 Polyneuropathy, unspecified: Secondary | ICD-10-CM | POA: Diagnosis not present

## 2021-09-15 DIAGNOSIS — M461 Sacroiliitis, not elsewhere classified: Secondary | ICD-10-CM | POA: Diagnosis not present

## 2021-10-06 DIAGNOSIS — G4733 Obstructive sleep apnea (adult) (pediatric): Secondary | ICD-10-CM | POA: Diagnosis not present

## 2021-10-27 ENCOUNTER — Other Ambulatory Visit: Payer: Self-pay | Admitting: Family Medicine

## 2021-11-06 DIAGNOSIS — G4733 Obstructive sleep apnea (adult) (pediatric): Secondary | ICD-10-CM | POA: Diagnosis not present

## 2021-11-10 ENCOUNTER — Encounter: Payer: Self-pay | Admitting: Pulmonary Disease

## 2021-11-10 ENCOUNTER — Ambulatory Visit: Payer: Medicare Other | Admitting: Pulmonary Disease

## 2021-11-10 VITALS — BP 122/60 | HR 64 | Temp 97.6°F | Ht 60.0 in | Wt 142.8 lb

## 2021-11-10 DIAGNOSIS — G4733 Obstructive sleep apnea (adult) (pediatric): Secondary | ICD-10-CM | POA: Diagnosis not present

## 2021-11-10 DIAGNOSIS — Z23 Encounter for immunization: Secondary | ICD-10-CM

## 2021-11-10 DIAGNOSIS — R0609 Other forms of dyspnea: Secondary | ICD-10-CM | POA: Diagnosis not present

## 2021-11-10 NOTE — Assessment & Plan Note (Signed)
Mild restriction on PFTs. Flu shot today. Advised RSV vaccine and COVID booster

## 2021-11-10 NOTE — Assessment & Plan Note (Signed)
BiPAP download was reviewed which shows excellent control of events, minimal leak and excellent compliance on 10/5.  BiPAP is only helped improve her daytime somnolence and fatigue. She is very compliant We reviewed different types of masks including nasal mask and I encouraged her to try AirFit F30  Weight loss encouraged, compliance with goal of at least 4-6 hrs every night is the expectation. Advised against medications with sedative side effects Cautioned against driving when sleepy - understanding that sleepiness will vary on a day to day basis

## 2021-11-10 NOTE — Patient Instructions (Signed)
BiPAP machine is working well. X Trial of AirFit F30 fullface mask  X Flu shot  RSV vaccine recommended

## 2021-11-10 NOTE — Progress Notes (Signed)
   Subjective:    Patient ID: April [name], female    DOB: 07-Jan-1941, 81 y.o.   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    Current BiPAP machine since 2018  Annual follow-up visit.  Pedal edema is improved she is on maintenance torsemide 20 mg daily echo did not show any evidence of systolic dysfunction. She is still unable to lie down flat without her BiPAP machine.  Machine works very well, denies daytime somnolence or sleep pressure Mask leaves a mark over the bridge of her nose and she would like alternatives Breathing is okay  Significant tests/ events reviewed  08/2012 sniff test negative   HRCT 03/2007 & 05/2008 did not show any evidence of pulmonary fibrosis . Large hiatal hernia was noted.    PFT initially showed intraparenchymal restriction with a diffuse capacity of 49%. PFTs 09/2007 >> marked improvment, FEV1% was 78, FEV68 %, FVC 63%, TLC 67%, DLCO remains 51%.  PSG  did reveal mild obstructive sleep apnea. On BiPAP +10/5 & is able to sleep supine.  Echo- no rt--> LT shunt    Feb, 2012 - Hoarseness resolved - lisinopril stopped, neg ENt evaluation April [name])  06/17/2010 -ONO on bipap/ RA desaturation for about 6 mins -    08/22/2012 spirometry >> FVC 75% , mild restriction - improved slightly  Review of Systems neg for any significant sore throat, dysphagia, itching, sneezing, nasal congestion or excess/ purulent secretions, fever, chills, sweats, unintended wt loss, pleuritic or exertional cp, hempoptysis, orthopnea pnd or change in chronic leg swelling. Also denies presyncope, palpitations, heartburn, abdominal pain, nausea, vomiting, diarrhea or change in bowel or urinary habits, dysuria,hematuria, rash, arthralgias, visual complaints, headache, numbness weakness or ataxia.     Objective:   Physical Exam  Gen. Pleasant, well-nourished, in no distress ENT - no thrush, no  pallor/icterus,no post nasal drip Neck: No JVD, no thyromegaly, no carotid bruits Lungs: no use of accessory muscles, no dullness to percussion, decreased BL without rales or rhonchi  Cardiovascular: Rhythm regular, heart sounds  normal, no murmurs or gallops, no peripheral edema Musculoskeletal: No deformities, no cyanosis or clubbing        Assessment & Plan:

## 2021-11-16 ENCOUNTER — Other Ambulatory Visit: Payer: Self-pay | Admitting: Family Medicine

## 2021-11-16 NOTE — Telephone Encounter (Signed)
Requested Prescriptions  Pending Prescriptions Disp Refills  . torsemide (DEMADEX) 20 MG tablet [Pharmacy Med Name: TORSEMIDE 20 MG TAB] 90 tablet 0    Sig: TAKE 1 TABLET BY MOUTH ONCE DAILY     Cardiovascular:  Diuretics - Loop Failed - 11/16/2021 12:16 PM      Failed - Cl in normal range and within 180 days    Chloride  Date Value Ref Range Status  05/23/2021 96 (L) 98 - 110 mmol/L Final         Failed - Mg Level in normal range and within 180 days    Magnesium  Date Value Ref Range Status  05/16/2021 2.0 1.7 - 2.4 mg/dL Final    Comment:    Performed at [facility] [place] [address], [place], [place] [address]         Passed - K in normal range and within 180 days    Potassium  Date Value Ref Range Status  05/23/2021 4.0 3.5 - 5.3 mmol/L Final         Passed - Ca in normal range and within 180 days    Calcium  Date Value Ref Range Status  05/23/2021 9.5 8.6 - 10.4 mg/dL Final         Passed - Na in normal range and within 180 days    Sodium  Date Value Ref Range Status  05/23/2021 142 135 - 146 mmol/L Final         Passed - Cr in normal range and within 180 days    Creat  Date Value Ref Range Status  05/23/2021 0.83 0.60 - 1.00 mg/dL Final         Passed - Last BP in normal range    BP Readings from Last 1 Encounters:  11/10/21 122/60         Passed - Valid encounter within last 6 months    Recent Outpatient Visits          5 months ago Type 2 diabetes mellitus treated without insulin ([place])   [place] Pickard, Cammie Mcgee, MD   11 months ago Diastolic dysfunction   [place] [facility], Cammie Mcgee, MD   1 year ago Leg swelling   [place] Susy Frizzle, MD   1 year ago Type 2 diabetes mellitus treated without insulin ([place])   [place] Susy Frizzle, MD   2 years ago Type 2 diabetes mellitus treated without insulin ([place])   [facility] Pickard,  Cammie Mcgee, MD

## 2021-12-01 ENCOUNTER — Ambulatory Visit (INDEPENDENT_AMBULATORY_CARE_PROVIDER_SITE_OTHER): Payer: Medicare Other | Admitting: Family Medicine

## 2021-12-01 VITALS — BP 120/68 | HR 74 | Temp 97.9°F | Ht 60.0 in | Wt 145.6 lb

## 2021-12-01 DIAGNOSIS — L03116 Cellulitis of left lower limb: Secondary | ICD-10-CM | POA: Diagnosis not present

## 2021-12-01 MED ORDER — CEPHALEXIN 500 MG PO CAPS: 500.0000 mg | ORAL_CAPSULE | Freq: Three times a day (TID) | ORAL | 0 refills | Status: DC

## 2021-12-01 NOTE — Progress Notes (Signed)
Subjective:    Patient ID: April [name], female    DOB: 17-Dec-1940, 81 y.o.   MRN: [id number] Patient is a 81 year old Caucasian female with a history of diabetes mellitus who presents today with erythema on the anterior surface of her left leg.  She suffered a scratch to the anterior surface of her left leg earlier this week.  The skin surrounding this is now erythematous warm and swollen.  The patch of erythema is roughly 4 to 5 cm in diameter.  There is a large yellow vesicle above that and there is pitting edema in the left lower extremity +1.  The edema is similar in the right lower extremity. Past Medical History:  Diagnosis Date   Abnormal weight gain    Arthritis    "joints" (06/13/2013)   COPD (chronic obstructive pulmonary disease) (HCC)    Coronary artery disease, non-occlusive 06/2013   Cardiac Cath: sharp Angle take-off of Large Dominant Cx: ~70-80% mid Cx bifurcation lesion (Lateral OM &  AVGCx-PL-PDA both with hairpin ostial & ~50% lesions) - Not PCI amenable due to vessel tortuosity; ~40-50% mid LAD;Ramus - no significnat diseaes; small non-dominant RCA   Diverticulosis    GERD (gastroesophageal reflux disease)    History of stress test    a. 09/2006 nl Dobutamine Echo   Hyperlipidemia    Hypertension    NIDDM (non-insulin dependent diabetes mellitus)    On home oxygen therapy    "2L q hs; runs into my BIPAP" (06/13/2013)   OSA (obstructive sleep apnea)    "BIPAP w/O2" (06/13/2013)   Tracheobronchomalacia    a. followed by [facility].   Past Surgical History:  Procedure Laterality Date   CARDIAC CATHETERIZATION  06/2013   Cardiac Cath: sharp Angle take-off of Large Dominant Cx: ~70-80% mid Cx bifurcation lesion (Lateral OM &  AVGCx-PL-PDA both with hairpin ostial & ~50% lesions) - Not PCI amenable due to vessel tortuosity; ~40-50% mid LAD;Ramus - no significnat diseaes; small non-dominant RCA   LEFT HEART CATHETERIZATION WITH CORONARY ANGIOGRAM N/A 06/16/2013   Procedure:  LEFT HEART CATHETERIZATION WITH CORONARY ANGIOGRAM;  Surgeon: [name], MD;  Location: [facility] CATH LAB;  Service: Cardiovascular;  Laterality: N/A;   TRANSTHORACIC ECHOCARDIOGRAM  06/17/2013   Normal LV size and function. EF 55-60% with no regional WMA. Grade 1 diastolic dysfunction. No significant valvular lesions   TUBAL LIGATION     Current Outpatient Medications on File Prior to Visit  Medication Sig Dispense Refill   Calcium Carbonate-Vitamin D (CALCIUM 600 + D PO) Take 1 tablet by mouth daily.     gabapentin (NEURONTIN) 600 MG tablet TAKE 1 TABLET BY MOUTH 4 TIMES DAILY (Patient taking differently: Take 600 mg by mouth 2 (two) times daily.) 360 tablet 1   isosorbide mononitrate (IMDUR) 60 MG 24 hr tablet TAKE 1 TABLET BY MOUTH ONCE DAILY 90 tablet 3   LANTUS SOLOSTAR 100 UNIT/ML Solostar Pen INJECT 40-50 UNITS UNDER THE SKIN ONCE EVERY NIGHT AT BEDTIME. (Patient taking differently: 40-50 Units at bedtime.) 15 mL 3   metFORMIN (GLUCOPHAGE) 500 MG tablet TAKE 1 TABLET BY MOUTH TWICE (2) DAILY WITH A MEAL 180 tablet 3   metoprolol tartrate (LOPRESSOR) 25 MG tablet TAKE 1 TABLET BY MOUTH TWICE A DAY 180 tablet 3   Multiple Vitamin (MULTIVITAMIN) capsule Take 1 capsule by mouth daily.     Omega-3 Fatty Acids (FISH OIL) 1000 MG CAPS Take 1 capsule by mouth daily.     omeprazole (PRILOSEC) 20  MG capsule Take 20 mg by mouth daily.     ONETOUCH VERIO test strip USE AS DIRECTED TO MONITOR BLOOD SUGARS UP TO 3 TIMES DAILY AS NEEDED 100 each 3   potassium chloride (KLOR-CON) 10 MEQ tablet TAKE 1 TABLET BY MOUTH TWICE A DAY (Patient taking differently: Take 10 mEq by mouth 2 (two) times daily.) 60 tablet 5   rosuvastatin (CRESTOR) 40 MG tablet TAKE ONE TABLET BY MOUTH ONCE DAILY 90 tablet 3   torsemide (DEMADEX) 20 MG tablet TAKE 1 TABLET BY MOUTH ONCE DAILY 90 tablet 0   No current facility-administered medications on file prior to visit.   Allergies  Allergen Reactions   Neomycin Other (See  Comments)    Visual disturbance and swelling in eyes   Social History   Socioeconomic History   Marital status: Married    Spouse name: Not on file   Number of children: 3   Years of education: Not on file   Highest education level: Not on file  Occupational History   Occupation: Systems analyst  Tobacco Use   Smoking status: Former    Packs/day: 1.50    Years: 20.00    Total pack years: 30.00    Types: Cigarettes    Quit date: 02/13/1986    Years since quitting: 35.8   Smokeless tobacco: Never  Substance and Sexual Activity   Alcohol use: No   Drug use: No   Sexual activity: Yes  Other Topics Concern   Not on file  Social History Narrative   Lives in [place] with her husband. 3 children.  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   Former 30 Pk/yr Smoker (1.5 PPD x 20 yr) - quit in 1998.   Social Determinants of Health   Financial Resource Strain: Low Risk  (07/09/2020)   Overall Financial Resource Strain (CARDIA)    Difficulty of Paying Living Expenses: Not hard at all  Food Insecurity: No Food Insecurity (07/09/2020)   Hunger Vital Sign    Worried About Running Out of Food in the Last Year: Never true    Ran Out of Food in the Last Year: Never true  Transportation Needs: No Transportation Needs (07/09/2020)   PRAPARE - Hydrologist (Medical): No    Lack of Transportation (Non-Medical): No  Physical Activity: Inactive (07/09/2020)   Exercise Vital Sign    Days of Exercise per Week: 0 days    Minutes of Exercise per Session: 0 min  Stress: No Stress Concern Present (07/09/2020)   [place]    Feeling of Stress : Not at all  Social Connections: Moderately Integrated (07/09/2020)   Social Connection and Isolation Panel [NHANES]    Frequency of Communication with Friends and Family: More than three times a week    Frequency of Social Gatherings with Friends and Family: More than three  times a week    Attends Religious Services: More than 4 times per year    Active Member of [employer] or Organizations: No    Attends Archivist Meetings: Never    Marital Status: Married  Human resources officer Violence: Not At Risk (07/09/2020)   Humiliation, Afraid, Rape, and Kick questionnaire    Fear of Current or Ex-Partner: No    Emotionally Abused: No    Physically Abused: No    Sexually Abused: No      Review of Systems  Musculoskeletal:  Positive for back pain.  All other systems  reviewed and are negative.      Objective:   Physical Exam Constitutional:      General: She is not in acute distress.    Appearance: Normal appearance. She is obese. She is not ill-appearing, toxic-appearing or diaphoretic.  HENT:     Head: Normocephalic and atraumatic.     Left Ear: There is no impacted cerumen.  Neck:     Vascular: No carotid bruit.  Cardiovascular:     Rate and Rhythm: Normal rate and regular rhythm.     Pulses: Normal pulses.     Heart sounds: Murmur heard.     No friction rub. No gallop.  Pulmonary:     Effort: Pulmonary effort is normal. No respiratory distress.     Breath sounds: Normal breath sounds. No stridor. No wheezing, rhonchi or rales.  Chest:     Chest wall: No tenderness.  Musculoskeletal:     Cervical back: Normal range of motion and neck supple. No rigidity.     Right lower leg: Edema present.     Left lower leg: Edema present.  Lymphadenopathy:     Cervical: No cervical adenopathy.  Skin:    Findings: Erythema and lesion present. No bruising or rash.  Neurological:     General: No focal deficit present.     Mental Status: She is alert and oriented to person, place, and time. Mental status is at baseline.     Cranial Nerves: No cranial nerve deficit.     Sensory: No sensory deficit.     Motor: No weakness.     Coordination: Coordination normal.           Assessment & Plan:  Cellulitis of left lower extremity - Plan: BASIC METABOLIC  PANEL WITH GFR I believe the patient has suffered cellulitis from the scratch on her anterior left lower extremity.  We will treat this with Keflex 500 mg p.o. 3 times daily for 7 days.  I believe that she has peripheral edema that is separating layers of the skin forming a large vesicle that is roughly 2 cm in diameter.  I recommended that she wear compression hose on both legs and increase her torsemide to 1 pill twice daily for the remainder of this week and then reassess next week.  Anticipate that the vesicle will resolve with compression hose and diuretic therapy and the erythema will resolve with antibiotics.  Recheck next week if no better or sooner if worsening

## 2021-12-02 LAB — BASIC METABOLIC PANEL WITH GFR
BUN: 15 mg/dL (ref 7–25)
CO2: 40 mmol/L — ABNORMAL HIGH (ref 20–32)
Calcium: 10.1 mg/dL (ref 8.6–10.4)
Chloride: 97 mmol/L — ABNORMAL LOW (ref 98–110)
Creat: 1 mg/dL (ref 0.60–1.00)
Glucose, Bld: 226 mg/dL — ABNORMAL HIGH (ref 65–99)
Potassium: 4.5 mmol/L (ref 3.5–5.3)
Sodium: 142 mmol/L (ref 135–146)
eGFR: 57 mL/min/{1.73_m2} — ABNORMAL LOW (ref >=60)

## 2021-12-06 DIAGNOSIS — G4733 Obstructive sleep apnea (adult) (pediatric): Secondary | ICD-10-CM | POA: Diagnosis not present

## 2021-12-12 ENCOUNTER — Other Ambulatory Visit: Payer: Medicare Other

## 2021-12-12 DIAGNOSIS — E119 Type 2 diabetes mellitus without complications: Secondary | ICD-10-CM

## 2021-12-13 LAB — HEMOGLOBIN A1C
Hgb A1c MFr Bld: 7 % of total Hgb — ABNORMAL HIGH (ref <5.7)
Mean Plasma Glucose: 154 mg/dL
eAG (mmol/L): 8.5 mmol/L

## 2022-01-13 ENCOUNTER — Telehealth: Payer: Self-pay

## 2022-01-13 NOTE — Telephone Encounter (Signed)
Attempted to call to pt on both phone #'s, unable to LVM neither one of them.   Need to varify numbers with pt next tim in office.

## 2022-01-17 ENCOUNTER — Telehealth: Payer: Self-pay | Admitting: Family Medicine

## 2022-01-17 NOTE — Telephone Encounter (Signed)
Left message for patient to call back and schedule Medicare Annual Wellness Visit (AWV) in office.   If not able to come in office, please offer to do virtually or by telephone.   Last AWV:07/09/2020   Please schedule at anytime with [facility] S[facility]  If any questions, please contact me at [phone].  Thank you ,  Colletta [place]

## 2022-01-23 ENCOUNTER — Other Ambulatory Visit: Payer: Self-pay | Admitting: Family Medicine

## 2022-02-10 ENCOUNTER — Ambulatory Visit (INDEPENDENT_AMBULATORY_CARE_PROVIDER_SITE_OTHER): Payer: Medicare Other

## 2022-02-10 ENCOUNTER — Ambulatory Visit
Admission: EM | Admit: 2022-02-10 | Discharge: 2022-02-10 | Disposition: A | Discharge status: Home / Self Care | Payer: Medicare Other | Attending: Emergency Medicine | Admitting: Emergency Medicine

## 2022-02-10 DIAGNOSIS — R0989 Other specified symptoms and signs involving the circulatory and respiratory systems: Secondary | ICD-10-CM

## 2022-02-10 DIAGNOSIS — B349 Viral infection, unspecified: Secondary | ICD-10-CM

## 2022-02-10 DIAGNOSIS — Z0389 Encounter for observation for other suspected diseases and conditions ruled out: Secondary | ICD-10-CM | POA: Diagnosis not present

## 2022-02-10 DIAGNOSIS — J40 Bronchitis, not specified as acute or chronic: Secondary | ICD-10-CM

## 2022-02-10 MED ORDER — METHYLPREDNISOLONE 8 MG PO TABS: 16.0000 mg | ORAL_TABLET | Freq: Every day | ORAL | 0 refills | Status: AC

## 2022-02-10 MED ORDER — ALBUTEROL SULFATE HFA 108 (90 BASE) MCG/ACT IN AERS: 2.0000 | INHALATION_SPRAY | Freq: Four times a day (QID) | RESPIRATORY_TRACT | 2 refills | Status: DC | PRN

## 2022-02-10 MED ORDER — GUAIFENESIN 400 MG PO TABS: ORAL_TABLET | ORAL | 0 refills | Status: DC

## 2022-02-10 MED ORDER — PROMETHAZINE-DM 6.25-15 MG/5ML PO SYRP
2.5000 mL | ORAL_SOLUTION | Freq: Every evening | ORAL | 0 refills | Status: DC | PRN
Start: 2022-02-10 — End: 2022-03-28

## 2022-02-10 MED ORDER — AEROCHAMBER PLUS FLO-VU LARGE MISC: 1.0000 | Freq: Once | 0 refills | Status: AC

## 2022-02-10 MED ORDER — ONDANSETRON 4 MG PO TBDP: 4.0000 mg | ORAL_TABLET | Freq: Three times a day (TID) | ORAL | 0 refills | Status: DC | PRN

## 2022-02-10 NOTE — ED Triage Notes (Signed)
Pt reports having a cough, fatigue, and nausea.   Started: last Saturday   Home interventions: mucinex

## 2022-02-10 NOTE — Discharge Instructions (Addendum)
Your chest x-ray did not reveal any evidence of pneumonia or bronchitis.  I believe at this time, you would benefit from using medications that will help open up your breathing and make your cough more productive during the daytime and nighttime cough medicine to help you sleep at night.  Please read below to learn more about the medications, dosages and frequencies that I recommend to help alleviate your symptoms and to get you feeling better soon:   Medrol (methylprednisolone): This is a steroid that will significantly calm your upper and lower airways, please take the daily recommended quantity of tablets daily with your breakfast meal starting tomorrow morning until the prescription is complete.      ProAir, Ventolin, Proventil (albuterol): This inhaled medication contains a short acting beta agonist bronchodilator.  This medication works on the smooth muscle that opens and constricts of your airways by relaxing the muscle.  The result of relaxation of the smooth muscle is increased air movement and improved work of breathing.  This is a short acting medication that can be used every 4-6 hours as needed for increased work of breathing, shortness of breath, wheezing and excessive coughing.  I have provided you with a prescription.    Robitussin, Mucinex (guaifenesin): This is an expectorant.  This helps break up chest congestion and loosen up thick nasal drainage making phlegm and drainage more liquid and therefore easier to remove.  I recommend being 400 mg three times daily as needed.      Promethazine DM: Promethazine is both a nasal decongestant and an antinausea medication that makes most patients feel fairly sleepy.  The DM is dextromethorphan, a cough suppressant found in many over-the-counter cough medications.  Please take 2.5 mL before bedtime to minimize your cough which will help you sleep better.  I have sent a prescription for this medication to your pharmacy.   Zofran (ondansetron):  This is a good antinausea medication that you can take every 8 hours as needed for symptoms of nausea and vomiting.  It is a dissolvable tablet that you do not need to take with water, just put it under your tongue and let it melt away.  I have sent a prescription to your pharmacy.   Please follow-up within the next 3-5 days either with your primary care provider or urgent care if your symptoms do not resolve.           Thank you for visiting urgent care today.  We appreciate the opportunity to participate in your care.

## 2022-02-10 NOTE — ED Provider Notes (Incomplete)
[facility]    CSN: [id number] Arrival date & time: 02/10/22  1306    HISTORY   Chief Complaint  Patient presents with   Nausea   Cough   Fatigue   HPI April [name] is a pleasant, 81 y.o. female who presents to urgent care today. Patient states that for the past week she has been experiencing cough productive of a lot of dark-colored sputum, fatigue, body ache, upper back pain secondary to coughing and nausea many.  Patient has an O2 saturation of 91% on arrival with an elevated blood pressure.  Patient states she has also had a loss of appetite.  Patient states her initial symptom was sore throat but that is better now.  Patient states has been taking Mucinex without meaningful relief of her symptoms.  Patient denies diarrhea, headache, loss of taste or smell.  Patient states that her husband has had similar symptoms for about a week longer than she has.  Patient states she attempted to be seen at one of her other urgent cares earlier today but was turned away advised that she did not have an appointment she would need to go elsewhere.  Patient is accompanied by her daughter today who states they also attempted to go over to the [facility] but were turned away after being told that they were in emergency room and not an urgent care.  Patient appears to be mentating well.    The history is provided by the patient.   Past Medical History:  Diagnosis Date   Abnormal weight gain    Arthritis    "joints" (06/13/2013)   COPD (chronic obstructive pulmonary disease) (HCC)    Coronary artery disease, non-occlusive 06/2013   Cardiac Cath: sharp Angle take-off of Large Dominant Cx: ~70-80% mid Cx bifurcation lesion (Lateral OM &  AVGCx-PL-PDA both with hairpin ostial & ~50% lesions) - Not PCI amenable due to vessel tortuosity; ~40-50% mid LAD;Ramus - no significnat diseaes; small non-dominant RCA   Diverticulosis    GERD (gastroesophageal reflux disease)    History of  stress test    a. 09/2006 nl Dobutamine Echo   Hyperlipidemia    Hypertension    NIDDM (non-insulin dependent diabetes mellitus)    On home oxygen therapy    "2L q hs; runs into my BIPAP" (06/13/2013)   OSA (obstructive sleep apnea)    "BIPAP w/O2" (06/13/2013)   Tracheobronchomalacia    a. followed by [facility].   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   Unstable angina - history of 06/13/2013   Hyperlipidemia associated with type 2 diabetes mellitus ([place])    GERD (gastroesophageal reflux disease)    Essential hypertension    Type 2 diabetes mellitus treated without insulin ([place])    Dyspnea    Diverticulosis    HOARSENESS 06/30/2009   OSA treated with BiPAP 01/01/2007   WEIGHT GAIN 01/01/2007   Past Surgical History:  Procedure Laterality Date   CARDIAC CATHETERIZATION  06/2013   Cardiac Cath: sharp Angle take-off of Large Dominant Cx: ~70-80% mid Cx bifurcation lesion (Lateral OM &  AVGCx-PL-PDA both with hairpin ostial & ~50% lesions) - Not  PCI amenable due to vessel tortuosity; ~40-50% mid LAD;Ramus - no significnat diseaes; small non-dominant RCA   LEFT HEART CATHETERIZATION WITH CORONARY ANGIOGRAM N/A 06/16/2013   Procedure: LEFT HEART CATHETERIZATION WITH CORONARY ANGIOGRAM;  Surgeon: [name], MD;  Location: [facility] CATH LAB;  Service: Cardiovascular;  Laterality: N/A;   TRANSTHORACIC ECHOCARDIOGRAM  06/17/2013   Normal LV size and function. EF 55-60% with no regional WMA. Grade 1 diastolic dysfunction. No significant valvular lesions   TUBAL LIGATION     OB History   No obstetric history on file.     Home Medications    Prior to Admission medications   Medication Sig Start Date End Date Taking? Authorizing Provider  Calcium Carbonate-Vitamin D (CALCIUM 600 + D PO) Take 1 tablet by mouth daily.    [provider]  cephALEXin (KEFLEX) 500 MG capsule Take 1 capsule (500 mg total) by mouth 3 (three) times daily. 12/01/21   Susy Frizzle, MD  gabapentin (NEURONTIN) 600 MG tablet TAKE 1 TABLET BY MOUTH 4 TIMES DAILY Patient taking differently: Take 600 mg by mouth 2 (two) times daily. 03/31/21   Susy Frizzle, MD  isosorbide mononitrate (IMDUR) 60 MG 24 hr tablet TAKE 1 TABLET BY MOUTH ONCE DAILY 05/23/21   [name], MD  LANTUS SOLOSTAR 100 UNIT/ML Solostar Pen INJECT 40-50 UNITS UNDER THE SKIN ONCE EVERY NIGHT AT BEDTIME. 01/23/22   Susy Frizzle, MD  metFORMIN (GLUCOPHAGE) 500 MG tablet TAKE 1 TABLET BY MOUTH TWICE (2) DAILY WITH A MEAL 09/02/21   Susy Frizzle, MD  metoprolol tartrate (LOPRESSOR) 25 MG tablet TAKE 1 TABLET BY MOUTH TWICE A DAY 07/13/21   [name], MD  Multiple Vitamin (MULTIVITAMIN) capsule Take 1 capsule by mouth daily.    [provider]  Omega-3 Fatty Acids (FISH OIL) 1000 MG CAPS Take 1 capsule by mouth daily.    [provider]  omeprazole (PRILOSEC) 20 MG capsule Take 20 mg by mouth daily.    [provider]  ONETOUCH VERIO test strip USE AS DIRECTED TO MONITOR BLOOD SUGARS UP TO 3 TIMES DAILY AS NEEDED 10/27/21   Susy Frizzle, MD  potassium chloride (KLOR-CON) 10 MEQ tablet TAKE 1 TABLET BY MOUTH TWICE A DAY Patient taking differently: Take 10 mEq by mouth 2 (two) times daily. 05/11/21   Susy Frizzle, MD  rosuvastatin (CRESTOR) 40 MG tablet TAKE ONE TABLET BY MOUTH ONCE DAILY 05/23/21   [name], MD  torsemide (DEMADEX) 20 MG tablet TAKE 1 TABLET BY MOUTH ONCE DAILY 11/16/21   Susy Frizzle, MD    Family History Family History  Problem Relation Age of Onset   Heart disease Mother         developed coronary dzs in her 47's.   Clotting disorder Mother    Breast cancer Sister 32   Diabetes Paternal Aunt    Asthma Maternal Grandmother    Heart disease Maternal Grandmother    Heart disease Maternal Grandfather    Diabetes Brother    CAD Brother        s/p cabg in his 51's   CAD Brother        s/p cabg in his 89's.   Social History Social History   Tobacco Use   Smoking status: Former    Packs/day: 1.50    Years: 20.00    Total pack years: 30.00    Types: Cigarettes    Quit date: 02/13/1986  Years since quitting: 36.0   Smokeless tobacco: Never  Substance Use Topics   Alcohol use: No   Drug use: No   Allergies   Neomycin  Review of Systems Review of Systems Pertinent findings revealed after performing a 14 point review of systems has been noted in the history of present illness.  Physical Exam Triage Vital Signs ED Triage Vitals  Enc Vitals Group     BP 12/10/20 0827 (!) 147/82     Pulse Rate 12/10/20 0827 72     Resp 12/10/20 0827 18     Temp 12/10/20 0827 98.3 F (36.8 C)     Temp Source 12/10/20 0827 Oral     SpO2 12/10/20 0827 98 %     Weight --      Height --      Head Circumference --      Peak Flow --      Pain Score 12/10/20 0826 5     Pain Loc --      Pain Edu? --      Excl. in [place]? --   No data found.  Updated Vital Signs BP (!) 144/62 (BP Location: Left Arm)   Pulse 78   Temp 98.6 F (37 C) (Oral)   Resp 14   LMP  (LMP Unknown)   SpO2 91%   Physical Exam Vitals and nursing note reviewed.  Constitutional:      General: She is awake.     Appearance: Normal appearance. She is well-developed and well-groomed. She is ill-appearing.  HENT:     Head: Normocephalic and atraumatic.     Salivary Glands: Right salivary gland is not diffusely enlarged or tender. Left salivary gland is not diffusely enlarged or tender.     Right Ear: Tympanic membrane, ear canal and external ear normal.     Left Ear: Tympanic membrane, ear  canal and external ear normal.     Ears:     Comments: Patient wears hearing aids, was able to remove them for otic exam    Nose: Mucosal edema, congestion and rhinorrhea present. Rhinorrhea is purulent.     Right Sinus: No maxillary sinus tenderness or frontal sinus tenderness.     Left Sinus: No maxillary sinus tenderness.     Mouth/Throat:     Lips: Pink.     Mouth: Mucous membranes are moist.     Tongue: No lesions. Tongue does not deviate from midline.     Palate: No mass and lesions.     Pharynx: Uvula midline. Pharyngeal swelling, posterior oropharyngeal erythema and uvula swelling present. No oropharyngeal exudate.     Tonsils: No tonsillar exudate. 0 on the right. 0 on the left.  Eyes:     General: Lids are normal.     Extraocular Movements: Extraocular movements intact.     Conjunctiva/sclera: Conjunctivae normal.     Pupils: Pupils are equal, round, and reactive to light.  Cardiovascular:     Rate and Rhythm: Normal rate and regular rhythm.     Pulses: Normal pulses.     Heart sounds: Normal heart sounds, S1 normal and S2 normal.  Pulmonary:     Effort: Pulmonary effort is normal. No tachypnea, bradypnea, accessory muscle usage, prolonged expiration, respiratory distress or retractions.     Breath sounds: No stridor, decreased air movement or transmitted upper airway sounds. Examination of the right-lower field reveals decreased breath sounds. Decreased breath sounds present. No wheezing, rhonchi or rales.  Abdominal:  General: Abdomen is flat. Bowel sounds are normal.     Palpations: Abdomen is soft.  Musculoskeletal:        General: Normal range of motion.     Cervical back: Full passive range of motion without pain, normal range of motion and neck supple.  Lymphadenopathy:     Cervical: Cervical adenopathy present.     Right cervical: Superficial cervical adenopathy and posterior cervical adenopathy present.     Left cervical: Superficial cervical adenopathy and  posterior cervical adenopathy present.  Skin:    General: Skin is warm and dry.  Neurological:     General: No focal deficit present.     Mental Status: She is alert and oriented to person, place, and time.     Motor: Motor function is intact.     Coordination: Coordination is intact.     Gait: Gait is intact.     Deep Tendon Reflexes: Reflexes are normal and symmetric.  Psychiatric:        Attention and Perception: Attention and perception normal.        Mood and Affect: Mood and affect normal.        Speech: Speech normal.        Behavior: Behavior normal. Behavior is cooperative.        Thought Content: Thought content normal.     Visual Acuity Right Eye Distance:   Left Eye Distance:   Bilateral Distance:    Right Eye Near:   Left Eye Near:    Bilateral Near:     UC Couse / Diagnostics / Procedures:     Radiology No results found.  Procedures Procedures (including critical care time) EKG  Pending results:  Labs Reviewed - No data to display  Medications Ordered in UC: Medications - No data to display  UC Diagnoses / Final Clinical Impressions(s)   I have reviewed the triage vital signs and the nursing notes.  Pertinent labs & imaging results that were available during my care of the patient were reviewed by me and considered in my medical decision making (see chart for details).    Final diagnoses:  None   *** Please see discharge instructions below for further details of plan of care as provided to patient. ED Prescriptions   None    PDMP not reviewed this encounter.  Disposition Upon Discharge:  Condition: stable for discharge home Home: take medications as prescribed; routine discharge instructions as discussed; follow up as advised.  Patient presented with an acute illness with associated systemic symptoms and significant discomfort requiring urgent management. In my opinion, this is a condition that a prudent lay person (someone who possesses  an average knowledge of health and medicine) may potentially expect to result in complications if not addressed urgently such as respiratory distress, impairment of bodily function or dysfunction of bodily organs.   Routine symptom specific, illness specific and/or disease specific instructions were discussed with the patient and/or caregiver at length.   As such, the patient has been evaluated and assessed, work-up was performed and treatment was provided in alignment with urgent care protocols and evidence based medicine.  Patient/parent/caregiver has been advised that the patient may require follow up for further testing and treatment if the symptoms continue in spite of treatment, as clinically indicated and appropriate.  If the patient was tested for COVID-19, Influenza and/or RSV, then the patient/parent/guardian was advised to isolate at home pending the results of his/her diagnostic coronavirus test and potentially longer if they're positive.  I have also advised pt that if his/her COVID-19 test returns positive, it's recommended to self-isolate for at least 10 days after symptoms first appeared AND until fever-free for 24 hours without fever reducer AND other symptoms have improved or resolved. Discussed self-isolation recommendations as well as instructions for household member/close contacts as per the [facility] and [place] DHHS, and also gave patient the [place] packet with this information.  Patient/parent/caregiver has been advised to return to the [facility] or PCP in 3-5 days if no better; to PCP or the Emergency Department if new signs and symptoms develop, or if the current signs or symptoms continue to change or worsen for further workup, evaluation and treatment as clinically indicated and appropriate  The patient will follow up with their current PCP if and as advised. If the patient does not currently have a PCP we will assist them in obtaining one.   The patient may need specialty follow up if the  symptoms continue, in spite of conservative treatment and management, for further workup, evaluation, consultation and treatment as clinically indicated and appropriate.  Patient/parent/caregiver verbalized understanding and agreement of plan as discussed.  All questions were addressed during visit.  Please see discharge instructions below for further details of plan.  Discharge Instructions: Discharge Instructions   None     This office note has been dictated using Dragon speech recognition software.  Unfortunately, this method of dictation can sometimes lead to typographical or grammatical errors.  I apologize for your inconvenience in advance if this occurs.  Please do not hesitate to reach out to me if clarification is needed.

## 2022-02-16 ENCOUNTER — Other Ambulatory Visit: Payer: Self-pay | Admitting: Family Medicine

## 2022-02-16 ENCOUNTER — Telehealth: Payer: Self-pay | Admitting: *Deleted

## 2022-02-16 ENCOUNTER — Other Ambulatory Visit: Payer: Self-pay

## 2022-02-16 ENCOUNTER — Telehealth: Payer: Self-pay

## 2022-02-16 ENCOUNTER — Telehealth: Payer: Self-pay | Admitting: Family Medicine

## 2022-02-16 DIAGNOSIS — U071 COVID-19: Secondary | ICD-10-CM

## 2022-02-16 MED ORDER — NIRMATRELVIR/RITONAVIR (PAXLOVID)TABLET: 3.0000 | ORAL_TABLET | Freq: Two times a day (BID) | ORAL | 0 refills | Status: AC

## 2022-02-16 MED ORDER — NIRMATRELVIR/RITONAVIR (PAXLOVID)TABLET: 3.0000 | ORAL_TABLET | Freq: Two times a day (BID) | ORAL | 0 refills | Status: DC

## 2022-02-16 MED ORDER — MOLNUPIRAVIR EUA 200MG CAPSULE: 4.0000 | ORAL_CAPSULE | Freq: Two times a day (BID) | ORAL | 0 refills | Status: DC

## 2022-02-16 NOTE — Telephone Encounter (Signed)
Pt's grandaughter, Ebony Hail, called stated that would you sent in paxlovid instead molnupiravir EUA due to cost.

## 2022-02-16 NOTE — Telephone Encounter (Signed)
Patients daughter in law, contacted Access Nurse last evening stated that patient has tested positive for Covid on a home test.      April [name], daughter in law,  called the office this morning to see if you had received the message that pt has Covid. April [name] asks if an Rx could be sent in for April [name]? Thank you.

## 2022-02-16 NOTE — Telephone Encounter (Signed)
Received call from [place] at [facility] to report they are out of stock of both COVID-19 medications; unable to fill script for   molnupiravir EUA (LAGEVRIO) 200 mg CAPS capsule [[id number]]   [facility] [place] [facility].  Gerald Stabs stated he wasn't able to reach the patient to notify her.  Please advise patient at [phone].

## 2022-02-16 NOTE — Telephone Encounter (Signed)
Requested Prescriptions  Pending Prescriptions Disp Refills   gabapentin (NEURONTIN) 600 MG tablet [Pharmacy Med Name: GABAPENTIN 600 MG TAB] 360 tablet 0    Sig: TAKE 1 TABLET BY MOUTH 4 TIMES DAILY     Neurology: Anticonvulsants - gabapentin Passed - 02/16/2022  9:50 AM      Passed - Cr in normal range and within 360 days    Creat  Date Value Ref Range Status  12/01/2021 1.00 0.60 - 1.00 mg/dL Final         Passed - Completed PHQ-2 or PHQ-9 in the last 360 days      Passed - Valid encounter within last 12 months    Recent Outpatient Visits           8 months ago Type 2 diabetes mellitus treated without insulin ([place])   [place] Pickard, Cammie Mcgee, MD   1 year ago Diastolic dysfunction   [place] [facility], Cammie Mcgee, MD   1 year ago Leg swelling   [place] Susy Frizzle, MD   1 year ago Type 2 diabetes mellitus treated without insulin ([place])   [place] Susy Frizzle, MD   2 years ago Type 2 diabetes mellitus treated without insulin ([place])   [facility] Pick[facility], Cammie Mcgee, MD

## 2022-02-16 NOTE — Telephone Encounter (Signed)
Patients daughter contacted Access Nurse last evening stated that patient has tested positive for Covid on a home test.  I contacted patients family and they are asking if medication can be sent in to patients pharmacy.

## 2022-02-17 ENCOUNTER — Telehealth: Payer: Self-pay

## 2022-02-17 ENCOUNTER — Other Ambulatory Visit: Payer: Self-pay | Admitting: Family Medicine

## 2022-02-17 MED ORDER — ALPRAZOLAM 0.5 MG PO TABS: 0.5000 mg | ORAL_TABLET | Freq: Three times a day (TID) | ORAL | 0 refills | Status: DC | PRN

## 2022-02-17 NOTE — Telephone Encounter (Signed)
Pt's daughter in law, Ebony Hail, called asking if her mother in law can do a mild anti-anxiety pill. Ebony Hail states that the time is close for pt's husband to pass away. [facility]. Thank you.

## 2022-02-20 ENCOUNTER — Telehealth: Payer: Self-pay | Admitting: Family Medicine

## 2022-02-20 NOTE — Telephone Encounter (Signed)
Left message for patient to call back and schedule Medicare Annual Wellness Visit (AWV) in office.   If not able to come in office, please offer to do virtually or by telephone.   Last AWV: 07/09/2020   Please schedule at any time with [facility]-Nurse Health Advisor.  30 minute appointment  Any questions, please contact me at [phone]   Thank you,   [facility]  Ambulatory Clinical Support for [place] [facility]. We Are. [facility] ??[phone] or ??[phone]

## 2022-02-27 ENCOUNTER — Telehealth: Payer: Self-pay

## 2022-02-27 NOTE — Telephone Encounter (Signed)
Spoke w/grand daughter, re pcp's msg/recommendations. Grand-daughter, voiced understanding and will call back to make an appt w/pcp sometime next week.   Nothing further needed.

## 2022-02-27 NOTE — Telephone Encounter (Signed)
April [name], call stated if you would send an Rx for anti-depressant for pt due to loss of husband last week.  The xanax was making pt like a zombie.  Pls advice

## 2022-03-22 ENCOUNTER — Other Ambulatory Visit: Payer: Self-pay | Admitting: Family Medicine

## 2022-03-22 ENCOUNTER — Telehealth: Payer: Self-pay | Admitting: Family Medicine

## 2022-03-22 NOTE — Telephone Encounter (Signed)
Left message for patient to call back and schedule Medicare Annual Wellness Visit (AWV) in office.   If not able to come in office, please offer to do virtually or by telephone.   Last AWV:07/09/2020   Please schedule at any time with [facility]-Nurse Health Advisor.  30 minute appointment  Any questions, please contact me at [phone]   Thank you,   [facility]  Ambulatory Clinical Support for [place] Are. We Are. [facility] ??[phone] or ??[phone]

## 2022-03-23 ENCOUNTER — Other Ambulatory Visit: Payer: Self-pay

## 2022-03-23 DIAGNOSIS — Z79899 Other long term (current) drug therapy: Secondary | ICD-10-CM

## 2022-03-24 DIAGNOSIS — M461 Sacroiliitis, not elsewhere classified: Secondary | ICD-10-CM | POA: Diagnosis not present

## 2022-03-26 DIAGNOSIS — M545 Low back pain, unspecified: Secondary | ICD-10-CM | POA: Diagnosis not present

## 2022-03-28 ENCOUNTER — Encounter: Payer: Self-pay | Admitting: Family Medicine

## 2022-03-28 ENCOUNTER — Ambulatory Visit (INDEPENDENT_AMBULATORY_CARE_PROVIDER_SITE_OTHER): Payer: Medicare Other | Admitting: Family Medicine

## 2022-03-28 VITALS — BP 130/86 | HR 67 | Temp 97.8°F | Ht 61.0 in | Wt 119.0 lb

## 2022-03-28 DIAGNOSIS — R052 Subacute cough: Secondary | ICD-10-CM | POA: Diagnosis not present

## 2022-03-28 MED ORDER — PROMETHAZINE-DM 6.25-15 MG/5ML PO SYRP: 2.5000 mL | ORAL_SOLUTION | Freq: Every evening | ORAL | 0 refills | Status: DC | PRN

## 2022-03-28 NOTE — Progress Notes (Signed)
Acute Office Visit  Subjective:     Patient ID: April [name], female    DOB: 1940-07-14, 82 y.o.   MRN: [id number]  Chief Complaint  Patient presents with   Acute Visit    deep lingering cough for 1 week with drainage; mucous is gr    HPI Patient is in today for 1 week of mucopurulent cough with chest congestion and wheezing.   Denies runny nose, sinus pressure/headache, fever, sore throat, shortness of breath, chest pain. Home Covid negative yesterday. Has tried Delsym and Sinex.  Review of Systems  All other systems reviewed and are negative.   Past Medical History:  Diagnosis Date   Abnormal weight gain    Arthritis    "joints" (06/13/2013)   COPD (chronic obstructive pulmonary disease) (HCC)    Coronary artery disease, non-occlusive 06/2013   Cardiac Cath: sharp Angle take-off of Large Dominant Cx: ~70-80% mid Cx bifurcation lesion (Lateral OM &  AVGCx-PL-PDA both with hairpin ostial & ~50% lesions) - Not PCI amenable due to vessel tortuosity; ~40-50% mid LAD;Ramus - no significnat diseaes; small non-dominant RCA   Diverticulosis    GERD (gastroesophageal reflux disease)    History of stress test    a. 09/2006 nl Dobutamine Echo   Hyperlipidemia    Hypertension    NIDDM (non-insulin dependent diabetes mellitus)    On home oxygen therapy    "2L q hs; runs into my BIPAP" (06/13/2013)   OSA (obstructive sleep apnea)    "BIPAP w/O2" (06/13/2013)   Tracheobronchomalacia    a. followed by [facility].   Past Surgical History:  Procedure Laterality Date   CARDIAC CATHETERIZATION  06/2013   Cardiac Cath: sharp Angle take-off of Large Dominant Cx: ~70-80% mid Cx bifurcation lesion (Lateral OM &  AVGCx-PL-PDA both with hairpin ostial & ~50% lesions) - Not PCI amenable due to vessel tortuosity; ~40-50% mid LAD;Ramus - no significnat diseaes; small non-dominant RCA   LEFT HEART CATHETERIZATION WITH CORONARY ANGIOGRAM N/A 06/16/2013   Procedure: LEFT HEART CATHETERIZATION WITH  CORONARY ANGIOGRAM;  Surgeon: [name], MD;  Location: [facility] CATH LAB;  Service: Cardiovascular;  Laterality: N/A;   TRANSTHORACIC ECHOCARDIOGRAM  06/17/2013   Normal LV size and function. EF 55-60% with no regional WMA. Grade 1 diastolic dysfunction. No significant valvular lesions   TUBAL LIGATION     Current Outpatient Medications on File Prior to Visit  Medication Sig Dispense Refill   albuterol (VENTOLIN HFA) 108 (90 Base) MCG/ACT inhaler Inhale 2 puffs into the lungs every 6 (six) hours as needed for wheezing or shortness of breath (Cough). 18 g 2   Calcium Carbonate-Vitamin D (CALCIUM 600 + D PO) Take 1 tablet by mouth daily.     gabapentin (NEURONTIN) 600 MG tablet TAKE 1 TABLET BY MOUTH 4 TIMES DAILY 360 tablet 0   isosorbide mononitrate (IMDUR) 60 MG 24 hr tablet TAKE 1 TABLET BY MOUTH ONCE DAILY 90 tablet 3   LANTUS SOLOSTAR 100 UNIT/ML Solostar Pen INJECT 40-50 UNITS UNDER THE SKIN ONCE EVERY NIGHT AT BEDTIME. 15 mL 3   metFORMIN (GLUCOPHAGE) 500 MG tablet TAKE 1 TABLET BY MOUTH TWICE (2) DAILY WITH A MEAL 180 tablet 3   metoprolol tartrate (LOPRESSOR) 25 MG tablet TAKE 1 TABLET BY MOUTH TWICE A DAY 180 tablet 3   Multiple Vitamin (MULTIVITAMIN) capsule Take 1 capsule by mouth daily.     Omega-3 Fatty Acids (FISH OIL) 1000 MG CAPS Take 1 capsule by mouth daily.  omeprazole (PRILOSEC) 20 MG capsule Take 20 mg by mouth daily.     ONETOUCH VERIO test strip USE AS DIRECTED TO MONITOR BLOOD SUGARS UP TO 3 TIMES DAILY AS NEEDED 100 each 3   potassium chloride (KLOR-CON) 10 MEQ tablet TAKE 1 TABLET BY MOUTH TWICE A DAY 60 tablet 0   torsemide (DEMADEX) 20 MG tablet TAKE 1 TABLET BY MOUTH ONCE DAILY 90 tablet 0   ALPRAZolam (XANAX) 0.5 MG tablet Take 1 tablet (0.5 mg total) by mouth 3 (three) times daily as needed for anxiety. (Patient not taking: Reported on 03/28/2022) 20 tablet 0   guaifenesin (HUMIBID E) 400 MG TABS tablet Take 1 tablet 3 times daily as needed for chest congestion  and cough (Patient not taking: Reported on 03/28/2022) 21 tablet 0   ondansetron (ZOFRAN-ODT) 4 MG disintegrating tablet Take 1 tablet (4 mg total) by mouth every 8 (eight) hours as needed for nausea or vomiting. (Patient not taking: Reported on 03/28/2022) 20 tablet 0   rosuvastatin (CRESTOR) 40 MG tablet TAKE ONE TABLET BY MOUTH ONCE DAILY (Patient not taking: Reported on 03/28/2022) 90 tablet 3   No current facility-administered medications on file prior to visit.   Allergies  Allergen Reactions   Neomycin Other (See Comments)    Visual disturbance and swelling in eyes       Objective:    BP 130/86   Pulse 67   Temp 97.8 F (36.6 C) (Oral)   Ht 5' 1"$  (1.549 m)   Wt 119 lb (54 kg)   LMP  (LMP Unknown)   SpO2 92%   BMI 22.48 kg/m    Physical Exam Vitals and nursing note reviewed.  Constitutional:      Appearance: Normal appearance. She is normal weight.  HENT:     Head: Normocephalic and atraumatic.     Nose: Nose normal.     Mouth/Throat:     Mouth: Mucous membranes are moist.     Pharynx: Oropharynx is clear.  Eyes:     Conjunctiva/sclera: Conjunctivae normal.  Cardiovascular:     Rate and Rhythm: Normal rate and regular rhythm.     Pulses: Normal pulses.     Heart sounds: Normal heart sounds.  Pulmonary:     Effort: Pulmonary effort is normal.     Breath sounds: Normal breath sounds.  Musculoskeletal:     Cervical back: No tenderness.  Lymphadenopathy:     Cervical: No cervical adenopathy.  Skin:    General: Skin is warm and dry.  Neurological:     General: No focal deficit present.     Mental Status: She is alert and oriented to person, place, and time. Mental status is at baseline.  Psychiatric:        Mood and Affect: Mood normal.        Behavior: Behavior normal.        Thought Content: Thought content normal.        Judgment: Judgment normal.     No results found for any visits on 03/28/22.      Assessment & Plan:   Problem List Items  Addressed This Visit       Other   Subacute cough - Primary    Patient is here today with lingering cough with no accompanying symptoms s/p viral illness. No s/s of pneumonia or bacterial infection. Will treat cough with Promethazine PRN at bedtime and encourage rest and push fluids. Stressed to patient and family that if her symptoms persist  or worsen to have a low threshold for returning to office. Return to office for fever, worsening cough, malaise, purulent rhinorrhea, sinus pressure or headache.        Meds ordered this encounter  Medications   promethazine-dextromethorphan (PROMETHAZINE-DM) 6.25-15 MG/5ML syrup    Sig: Take 2.5 mLs by mouth at bedtime as needed for cough.    Dispense:  60 mL    Refill:  0    Order Specific Question:   Supervising Provider    Answer:   Jenna Luo T [id number]    Return if symptoms worsen or fail to improve.  [name], FNP

## 2022-03-28 NOTE — Assessment & Plan Note (Signed)
Patient is here today with lingering cough with no accompanying symptoms s/p viral illness. No s/s of pneumonia or bacterial infection. Will treat cough with Promethazine PRN at bedtime and encourage rest and push fluids. Stressed to patient and family that if her symptoms persist or worsen to have a low threshold for returning to office. Return to office for fever, worsening cough, malaise, purulent rhinorrhea, sinus pressure or headache.

## 2022-03-31 ENCOUNTER — Other Ambulatory Visit (HOSPITAL_COMMUNITY): Payer: Self-pay | Admitting: Physician Assistant

## 2022-03-31 DIAGNOSIS — M8008XA Age-related osteoporosis with current pathological fracture, vertebra(e), initial encounter for fracture: Secondary | ICD-10-CM | POA: Diagnosis not present

## 2022-04-01 ENCOUNTER — Ambulatory Visit (HOSPITAL_COMMUNITY)
Admission: RE | Admit: 2022-04-01 | Discharge: 2022-04-01 | Disposition: A | Discharge status: Home / Self Care | Payer: Medicare Other | Source: Ambulatory Visit | Attending: Physician Assistant | Admitting: Physician Assistant

## 2022-04-01 DIAGNOSIS — M8008XA Age-related osteoporosis with current pathological fracture, vertebra(e), initial encounter for fracture: Secondary | ICD-10-CM | POA: Insufficient documentation

## 2022-04-01 DIAGNOSIS — M4316 Spondylolisthesis, lumbar region: Secondary | ICD-10-CM | POA: Diagnosis not present

## 2022-04-03 ENCOUNTER — Other Ambulatory Visit: Payer: Self-pay | Admitting: Family Medicine

## 2022-04-03 ENCOUNTER — Telehealth: Payer: Self-pay | Admitting: Pharmacist

## 2022-04-03 NOTE — Progress Notes (Signed)
Care Management & Coordination Services Pharmacy Team  Reason for Encounter: Appointment Reminder  Contacted patient to confirm in office appointment with April [name], PharmD on 04/04/2022 at 63 am. Spoke with family on 04/03/2022   Do you have any problems getting your medications? No If yes what types of problems are you experiencing?  None  What is your top health concern you would like to discuss at your upcoming visit? Diabetes.  Have you seen any other providers since your last visit with PCP? No   Chart review:  Recent office visits:  03/28/2022 OV (Fam Med) [name], FNP; Will treat cough with Promethazine PRN at bedtime and encourage rest and push fluids.   12/01/2021 [facility] (PCP) Susy Frizzle, MD; We will treat this with Keflex 500 mg p.o. 3 times daily for 7 days. I recommended that she wear compression hose on both legs and increase her torsemide to 1 pill twice daily for the remainder of this week and then reassess next week   Recent consult visits:  11/10/2021 OV (Pulmonology) Rigoberto Noel, MD; no medication changes indicated.  Hospital visits:  02/10/2022 ED visit for Viral illness. Bronchitis Patient advised that I recommend short course of steroids and albuterol to help improve work of breathing and oxygen saturations. Patient provided with guaifenesin to promote expectoration and Promethazine DM for nighttime cough so she can get some sleep. Patient provided with Zofran for intermittent episodes of nausea.    Star Rating Drugs:  Metformin 500 mg last filled 02/16/2022 90 DS Rosuvastatin 40 mg last filled 02/16/2022 90 DS   Care Gaps: Annual wellness visit in last year? No  If Diabetic: Last eye exam / retinopathy screening: 03/09/2021 Last diabetic foot exam: 04/05/2020   Future Appointments  Date Time Provider Corder  04/04/2022  2:15 PM Susy Frizzle, MD [id number]  04/06/2022 10:00 AM Edythe Clarity, [place] None   April D  Calhoun, [place] Pharmacist Assistant [phone]

## 2022-04-04 ENCOUNTER — Ambulatory Visit (INDEPENDENT_AMBULATORY_CARE_PROVIDER_SITE_OTHER): Payer: Medicare Other | Admitting: Family Medicine

## 2022-04-04 ENCOUNTER — Encounter: Payer: Self-pay | Admitting: Family Medicine

## 2022-04-04 VITALS — BP 102/56 | HR 79 | Temp 97.6°F | Ht 61.0 in | Wt 130.0 lb

## 2022-04-04 DIAGNOSIS — E038 Other specified hypothyroidism: Secondary | ICD-10-CM

## 2022-04-04 DIAGNOSIS — E860 Dehydration: Secondary | ICD-10-CM

## 2022-04-04 DIAGNOSIS — R300 Dysuria: Secondary | ICD-10-CM

## 2022-04-04 DIAGNOSIS — R410 Disorientation, unspecified: Secondary | ICD-10-CM | POA: Diagnosis not present

## 2022-04-04 DIAGNOSIS — M8008XA Age-related osteoporosis with current pathological fracture, vertebra(e), initial encounter for fracture: Secondary | ICD-10-CM | POA: Insufficient documentation

## 2022-04-04 DIAGNOSIS — E119 Type 2 diabetes mellitus without complications: Secondary | ICD-10-CM

## 2022-04-04 DIAGNOSIS — G629 Polyneuropathy, unspecified: Secondary | ICD-10-CM | POA: Insufficient documentation

## 2022-04-04 DIAGNOSIS — M461 Sacroiliitis, not elsewhere classified: Secondary | ICD-10-CM | POA: Insufficient documentation

## 2022-04-04 LAB — URINALYSIS, ROUTINE W REFLEX MICROSCOPIC
Bilirubin Urine: NEGATIVE
Glucose, UA: NEGATIVE
Hyaline Cast: NONE SEEN /LPF
Ketones, ur: NEGATIVE
Nitrite: POSITIVE — AB
Specific Gravity, Urine: 1.02 (ref 1.001–1.035)
WBC, UA: >=60 /HPF — AB (ref 0–5)
pH: 6 (ref 5.0–8.0)

## 2022-04-04 LAB — MICROSCOPIC MESSAGE

## 2022-04-04 MED ORDER — LEVOFLOXACIN 500 MG PO TABS: 500.0000 mg | ORAL_TABLET | Freq: Every day | ORAL | 0 refills | Status: AC

## 2022-04-04 MED ORDER — SULFAMETHOXAZOLE-TRIMETHOPRIM 800-160 MG PO TABS: 1.0000 | ORAL_TABLET | Freq: Two times a day (BID) | ORAL | 0 refills | Status: DC

## 2022-04-04 MED ORDER — CEFTRIAXONE SODIUM 1 G IJ SOLR
1.0000 g | Freq: Once | INTRAMUSCULAR | Status: AC
  Administered 2022-04-04: 1 g via INTRAMUSCULAR

## 2022-04-04 NOTE — Progress Notes (Signed)
Subjective:    Patient ID: April [name], female    DOB: 05-28-1940, 82 y.o.   MRN: [id number]  Patient is a very pleasant 82 year old Caucasian female who recently lost her husband.  She presents today acutely ill.  She is confused.  She appears very dehydrated.  She is falling at home.  She has been coughing recently.  Urinalysis today shows +2 blood, positive nitrates, +2 leukocyte esterase.  Urine is cloudy.  She also has been coughing and on examination she has left basilar crackles on exam.  Family states that she is more confused.  Blood pressure is borderline low.  However she is afebrile. Past Medical History:  Diagnosis Date   Abnormal weight gain    Arthritis    "joints" (06/13/2013)   COPD (chronic obstructive pulmonary disease) (HCC)    Coronary artery disease, non-occlusive 06/2013   Cardiac Cath: sharp Angle take-off of Large Dominant Cx: ~70-80% mid Cx bifurcation lesion (Lateral OM &  AVGCx-PL-PDA both with hairpin ostial & ~50% lesions) - Not PCI amenable due to vessel tortuosity; ~40-50% mid LAD;Ramus - no significnat diseaes; small non-dominant RCA   Diverticulosis    GERD (gastroesophageal reflux disease)    History of stress test    a. 09/2006 nl Dobutamine Echo   Hyperlipidemia    Hypertension    NIDDM (non-insulin dependent diabetes mellitus)    On home oxygen therapy    "2L q hs; runs into my BIPAP" (06/13/2013)   OSA (obstructive sleep apnea)    "BIPAP w/O2" (06/13/2013)   Tracheobronchomalacia    a. followed by [facility].   Past Surgical History:  Procedure Laterality Date   CARDIAC CATHETERIZATION  06/2013   Cardiac Cath: sharp Angle take-off of Large Dominant Cx: ~70-80% mid Cx bifurcation lesion (Lateral OM &  AVGCx-PL-PDA both with hairpin ostial & ~50% lesions) - Not PCI amenable due to vessel tortuosity; ~40-50% mid LAD;Ramus - no significnat diseaes; small non-dominant RCA   LEFT HEART CATHETERIZATION WITH CORONARY ANGIOGRAM N/A 06/16/2013   Procedure:  LEFT HEART CATHETERIZATION WITH CORONARY ANGIOGRAM;  Surgeon: [name], MD;  Location: [facility] CATH LAB;  Service: Cardiovascular;  Laterality: N/A;   TRANSTHORACIC ECHOCARDIOGRAM  06/17/2013   Normal LV size and function. EF 55-60% with no regional WMA. Grade 1 diastolic dysfunction. No significant valvular lesions   TUBAL LIGATION     Current Outpatient Medications on File Prior to Visit  Medication Sig Dispense Refill   albuterol (VENTOLIN HFA) 108 (90 Base) MCG/ACT inhaler Inhale 2 puffs into the lungs every 6 (six) hours as needed for wheezing or shortness of breath (Cough). 18 g 2   ALPRAZolam (XANAX) 0.5 MG tablet Take 1 tablet (0.5 mg total) by mouth 3 (three) times daily as needed for anxiety. (Patient not taking: Reported on 03/28/2022) 20 tablet 0   Calcium Carbonate-Vitamin D (CALCIUM 600 + D PO) Take 1 tablet by mouth daily.     gabapentin (NEURONTIN) 600 MG tablet TAKE 1 TABLET BY MOUTH 4 TIMES DAILY 360 tablet 0   guaifenesin (HUMIBID E) 400 MG TABS tablet Take 1 tablet 3 times daily as needed for chest congestion and cough (Patient not taking: Reported on 03/28/2022) 21 tablet 0   isosorbide mononitrate (IMDUR) 60 MG 24 hr tablet TAKE 1 TABLET BY MOUTH ONCE DAILY 90 tablet 3   LANTUS SOLOSTAR 100 UNIT/ML Solostar Pen INJECT 40-50 UNITS UNDER THE SKIN ONCE EVERY NIGHT AT BEDTIME. 15 mL 3   metFORMIN (GLUCOPHAGE) 500  MG tablet TAKE 1 TABLET BY MOUTH TWICE (2) DAILY WITH A MEAL 180 tablet 3   metoprolol tartrate (LOPRESSOR) 25 MG tablet TAKE 1 TABLET BY MOUTH TWICE A DAY 180 tablet 3   Multiple Vitamin (MULTIVITAMIN) capsule Take 1 capsule by mouth daily.     Omega-3 Fatty Acids (FISH OIL) 1000 MG CAPS Take 1 capsule by mouth daily.     omeprazole (PRILOSEC) 20 MG capsule Take 20 mg by mouth daily.     ondansetron (ZOFRAN-ODT) 4 MG disintegrating tablet Take 1 tablet (4 mg total) by mouth every 8 (eight) hours as needed for nausea or vomiting. (Patient not taking: Reported on  03/28/2022) 20 tablet 0   ONETOUCH VERIO test strip USE AS DIRECTED TO MONITOR BLOOD SUGARS UP TO 3 TIMES DAILY AS NEEDED 100 each 3   potassium chloride (KLOR-CON) 10 MEQ tablet TAKE 1 TABLET BY MOUTH TWICE A DAY 60 tablet 0   promethazine-dextromethorphan (PROMETHAZINE-DM) 6.25-15 MG/5ML syrup Take 2.5 mLs by mouth at bedtime as needed for cough. 60 mL 0   rosuvastatin (CRESTOR) 40 MG tablet TAKE ONE TABLET BY MOUTH ONCE DAILY (Patient not taking: Reported on 03/28/2022) 90 tablet 3   torsemide (DEMADEX) 20 MG tablet TAKE 1 TABLET BY MOUTH ONCE DAILY 90 tablet 0   No current facility-administered medications on file prior to visit.   Allergies  Allergen Reactions   Neomycin Other (See Comments)    Visual disturbance and swelling in eyes   Social History   Socioeconomic History   Marital status: Married    Spouse name: Not on file   Number of children: 3   Years of education: Not on file   Highest education level: Not on file  Occupational History   Occupation: Systems analyst  Tobacco Use   Smoking status: Former    Packs/day: 1.50    Years: 20.00    Total pack years: 30.00    Types: Cigarettes    Quit date: 02/13/1986    Years since quitting: 36.1   Smokeless tobacco: Never  Substance and Sexual Activity   Alcohol use: No   Drug use: No   Sexual activity: Yes  Other Topics Concern   Not on file  Social History Narrative   Lives in [place] with her husband. 3 children.  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   Former 30 Pk/yr Smoker (1.5 PPD x 20 yr) - quit in 1998.   Social Determinants of Health   Financial Resource Strain: Low Risk  (07/09/2020)   Overall Financial Resource Strain (CARDIA)    Difficulty of Paying Living Expenses: Not hard at all  Food Insecurity: No Food Insecurity (07/09/2020)   Hunger Vital Sign    Worried About Running Out of Food in the Last Year: Never true    Ran Out of Food in the Last Year: Never true  Transportation Needs: No Transportation Needs  (07/09/2020)   PRAPARE - Hydrologist (Medical): No    Lack of Transportation (Non-Medical): No  Physical Activity: Inactive (07/09/2020)   Exercise Vital Sign    Days of Exercise per Week: 0 days    Minutes of Exercise per Session: 0 min  Stress: No Stress Concern Present (07/09/2020)   [place]    Feeling of Stress : Not at all  Social Connections: Moderately Integrated (07/09/2020)   Social Connection and Isolation Panel [NHANES]    Frequency of Communication with Friends and Family:  More than three times a week    Frequency of Social Gatherings with Friends and Family: More than three times a week    Attends Religious Services: More than 4 times per year    Active Member of [employer] or Organizations: No    Attends Archivist Meetings: Never    Marital Status: Married  Human resources officer Violence: Not At Risk (07/09/2020)   Humiliation, Afraid, Rape, and Kick questionnaire    Fear of Current or Ex-Partner: No    Emotionally Abused: No    Physically Abused: No    Sexually Abused: No      Review of Systems  Musculoskeletal:  Positive for back pain.  All other systems reviewed and are negative.      Objective:   Physical Exam Constitutional:      General: She is not in acute distress.    Appearance: Normal appearance. She is obese. She is not ill-appearing, toxic-appearing or diaphoretic.  HENT:     Head: Normocephalic and atraumatic.     Right Ear: External ear normal. There is no impacted cerumen.     Left Ear: External ear normal. There is no impacted cerumen.     Nose: Nose normal. No congestion or rhinorrhea.     Mouth/Throat:     Mouth: Mucous membranes are dry.     Pharynx: Oropharynx is clear.  Eyes:     General: No scleral icterus.       Right eye: No discharge.        Left eye: No discharge.     Extraocular Movements: Extraocular movements intact.      Conjunctiva/sclera: Conjunctivae normal.     Pupils: Pupils are equal, round, and reactive to light.  Neck:     Vascular: No carotid bruit.  Cardiovascular:     Rate and Rhythm: Normal rate and regular rhythm.     Pulses: Normal pulses.     Heart sounds: Murmur heard.     No friction rub. No gallop.  Pulmonary:     Effort: Pulmonary effort is normal. No respiratory distress.     Breath sounds: No stridor. Rales present. No wheezing or rhonchi.  Chest:     Chest wall: No tenderness.  Abdominal:     General: Abdomen is flat. Bowel sounds are normal. There is no distension.     Palpations: Abdomen is soft.     Tenderness: There is no abdominal tenderness. There is no right CVA tenderness, left CVA tenderness, guarding or rebound.     Hernia: No hernia is present.  Musculoskeletal:     Cervical back: Normal range of motion and neck supple. No rigidity.     Right lower leg: No edema.     Left lower leg: No edema.  Lymphadenopathy:     Cervical: No cervical adenopathy.  Skin:    Findings: No bruising, erythema, lesion or rash.  Neurological:     Mental Status: She is alert.     Gait: Gait abnormal.     Deep Tendon Reflexes: Reflexes normal.           Assessment & Plan:  Dysuria - Plan: Urinalysis, Routine w reflex microscopic  Type 2 diabetes mellitus treated without insulin (HCC) - Plan: CBC with Differential/Platelet, COMPLETE METABOLIC PANEL WITH GFR, Lipid panel, Hemoglobin A1c, Protein / Creatinine Ratio, Urine  Subclinical hypothyroidism - Plan: TSH  Delirium  Dehydration Patient appears clinically dehydrated.  Discontinue torsemide and potassium.  Try to push fluids.  Reduce  metoprolol by 50% due to low blood pressure.  Recheck the patient on Thursday.  Meanwhile treat urinary tract infection with Rocephin 1 g IM x 1 now.  Use Levaquin 500 mg daily for 7 days to cover urinary tract infection and what I suspect may be pneumonia in the left lower lobe.  Meanwhile check  baseline lab work.  Get A1c, TSH, CBC, CMP.  Avoid Advil.  Use Tylenol for pain.  Discontinue metformin as I suspect the patient may be in acute renal insufficiency.

## 2022-04-04 NOTE — Addendum Note (Signed)
Addended by: Chriss Driver on: 04/04/2022 03:16 PM   Modules accepted: Orders

## 2022-04-04 NOTE — Addendum Note (Signed)
Addended by: Chriss Driver on: 04/04/2022 03:11 PM   Modules accepted: Orders

## 2022-04-05 LAB — PROTEIN / CREATININE RATIO, URINE
Creatinine, Urine: 47 mg/dL (ref 20–275)
Protein/Creat Ratio: 4000 mg/g creat — ABNORMAL HIGH (ref 24–184)
Protein/Creatinine Ratio: 4 mg/mg creat — ABNORMAL HIGH (ref 0.024–0.184)
Total Protein, Urine: 188 mg/dL — ABNORMAL HIGH (ref 5–24)

## 2022-04-05 LAB — CBC WITH DIFFERENTIAL/PLATELET
Absolute Monocytes: 870 cells/uL (ref 200–950)
Basophils Absolute: 26 cells/uL (ref 0–200)
Basophils Relative: 0.2 %
Eosinophils Absolute: 13 cells/uL — ABNORMAL LOW (ref 15–500)
Eosinophils Relative: 0.1 %
HCT: 40.4 % (ref 35.0–45.0)
Hemoglobin: 13.3 g/dL (ref 11.7–15.5)
Lymphs Abs: 1101 cells/uL (ref 850–3900)
MCH: 28.9 pg (ref 27.0–33.0)
MCHC: 32.9 g/dL (ref 32.0–36.0)
MCV: 87.8 fL (ref 80.0–100.0)
MPV: 10.9 fL (ref 7.5–12.5)
Monocytes Relative: 6.8 %
Neutro Abs: 10790 cells/uL — ABNORMAL HIGH (ref 1500–7800)
Neutrophils Relative %: 84.3 %
Platelets: 405 10*3/uL — ABNORMAL HIGH (ref 140–400)
RBC: 4.6 10*6/uL (ref 3.80–5.10)
RDW: 13.3 % (ref 11.0–15.0)
Total Lymphocyte: 8.6 %
WBC: 12.8 10*3/uL — ABNORMAL HIGH (ref 3.8–10.8)

## 2022-04-05 LAB — COMPLETE METABOLIC PANEL WITH GFR
AG Ratio: 0.8 (calc) — ABNORMAL LOW (ref 1.0–2.5)
ALT: 84 U/L — ABNORMAL HIGH (ref 6–29)
AST: 123 U/L — ABNORMAL HIGH (ref 10–35)
Albumin: 2.8 g/dL — ABNORMAL LOW (ref 3.6–5.1)
Alkaline phosphatase (APISO): 110 U/L (ref 37–153)
BUN/Creatinine Ratio: 29 (calc) — ABNORMAL HIGH (ref 6–22)
BUN: 28 mg/dL — ABNORMAL HIGH (ref 7–25)
CO2: 21 mmol/L (ref 20–32)
Calcium: 10.1 mg/dL (ref 8.6–10.4)
Chloride: 97 mmol/L — ABNORMAL LOW (ref 98–110)
Creat: 0.96 mg/dL — ABNORMAL HIGH (ref 0.60–0.95)
Globulin: 3.6 g/dL (calc) (ref 1.9–3.7)
Glucose, Bld: 237 mg/dL — ABNORMAL HIGH (ref 65–99)
Potassium: 4 mmol/L (ref 3.5–5.3)
Sodium: 131 mmol/L — ABNORMAL LOW (ref 135–146)
Total Bilirubin: 0.3 mg/dL (ref 0.2–1.2)
Total Protein: 6.4 g/dL (ref 6.1–8.1)
eGFR: 60 mL/min/{1.73_m2} (ref >=60)

## 2022-04-05 LAB — TSH: TSH: 1.72 mIU/L (ref 0.40–4.50)

## 2022-04-05 LAB — LIPID PANEL
Cholesterol: 66 mg/dL (ref <200)
HDL: 29 mg/dL — ABNORMAL LOW (ref >=50)
LDL Cholesterol (Calc): 11 mg/dL (calc)
Non-HDL Cholesterol (Calc): 37 mg/dL (calc) (ref <130)
Total CHOL/HDL Ratio: 2.3 (calc) (ref <5.0)
Triglycerides: 188 mg/dL — ABNORMAL HIGH (ref <150)

## 2022-04-05 LAB — HEMOGLOBIN A1C
Hgb A1c MFr Bld: 7.3 % of total Hgb — ABNORMAL HIGH (ref <5.7)
Mean Plasma Glucose: 163 mg/dL
eAG (mmol/L): 9 mmol/L

## 2022-04-06 ENCOUNTER — Encounter: Payer: Self-pay | Admitting: Family Medicine

## 2022-04-06 ENCOUNTER — Encounter: Payer: Medicare Other | Admitting: Pharmacist

## 2022-04-06 ENCOUNTER — Ambulatory Visit (INDEPENDENT_AMBULATORY_CARE_PROVIDER_SITE_OTHER): Payer: Medicare Other | Admitting: Family Medicine

## 2022-04-06 ENCOUNTER — Telehealth: Payer: Self-pay

## 2022-04-06 ENCOUNTER — Ambulatory Visit
Admission: RE | Admit: 2022-04-06 | Discharge: 2022-04-06 | Disposition: A | Discharge status: Home / Self Care | Payer: Medicare Other | Source: Ambulatory Visit | Attending: Family Medicine | Admitting: Family Medicine

## 2022-04-06 VITALS — BP 112/62 | HR 86 | Ht 61.0 in | Wt 130.0 lb

## 2022-04-06 DIAGNOSIS — S82831A Other fracture of upper and lower end of right fibula, initial encounter for closed fracture: Secondary | ICD-10-CM | POA: Diagnosis not present

## 2022-04-06 DIAGNOSIS — M25571 Pain in right ankle and joints of right foot: Secondary | ICD-10-CM

## 2022-04-06 DIAGNOSIS — J189 Pneumonia, unspecified organism: Secondary | ICD-10-CM | POA: Diagnosis not present

## 2022-04-06 DIAGNOSIS — R7989 Other specified abnormal findings of blood chemistry: Secondary | ICD-10-CM

## 2022-04-06 DIAGNOSIS — M8008XD Age-related osteoporosis with current pathological fracture, vertebra(e), subsequent encounter for fracture with routine healing: Secondary | ICD-10-CM | POA: Diagnosis not present

## 2022-04-06 DIAGNOSIS — I2510 Atherosclerotic heart disease of native coronary artery without angina pectoris: Secondary | ICD-10-CM | POA: Diagnosis not present

## 2022-04-06 DIAGNOSIS — R0989 Other specified symptoms and signs involving the circulatory and respiratory systems: Secondary | ICD-10-CM | POA: Diagnosis not present

## 2022-04-06 DIAGNOSIS — R918 Other nonspecific abnormal finding of lung field: Secondary | ICD-10-CM | POA: Diagnosis not present

## 2022-04-06 LAB — URINE CULTURE
MICRO NUMBER:: 14589499
SPECIMEN QUALITY:: ADEQUATE

## 2022-04-06 MED ORDER — ESCITALOPRAM OXALATE 10 MG PO TABS: 10.0000 mg | ORAL_TABLET | Freq: Every day | ORAL | 3 refills | Status: DC

## 2022-04-06 NOTE — Progress Notes (Incomplete)
Care Management & Coordination Services Pharmacy Note  04/06/2022 Name:  April [name] MRN:  [id number] DOB:  [birth year]  Summary: ***  Recommendations/Changes made from today's visit: ***  Follow up plan: ***   Subjective: April [name] is an 82 y.o. year old female who is a primary patient of Pickard, Cammie Mcgee, MD.  The care coordination team was consulted for assistance with disease management and care coordination needs.    Engaged with patient face to face for initial visit.  Recent office visits:  03/28/2022 OV (Fam Med) [name], FNP; Will treat cough with Promethazine PRN at bedtime and encourage rest and push fluids.    12/01/2021 OV (PCP) Susy Frizzle, MD; We will treat this with Keflex 500 mg p.o. 3 times daily for 7 days. I recommended that she wear compression hose on both legs and increase her torsemide to 1 pill twice daily for the remainder of this week and then reassess next week    Recent consult visits:  11/10/2021 OV (Pulmonology) Rigoberto Noel, MD; no medication changes indicated.   Hospital visits:  02/10/2022 ED visit for Viral illness. Bronchitis Patient advised that I recommend short course of steroids and albuterol to help improve work of breathing and oxygen saturations. Patient provided with guaifenesin to promote expectoration and Promethazine DM for nighttime cough so she can get some sleep. Patient provided with Zofran for intermittent episodes of nausea.      Objective:  Lab Results  Component Value Date   CREATININE 0.96 (H) 04/04/2022   BUN 28 (H) 04/04/2022   EGFR 60 04/04/2022   GFRNONAA >60 05/16/2021   GFRAA 99 04/06/2020   NA 131 (L) 04/04/2022   K 4.0 04/04/2022   CALCIUM 10.1 04/04/2022   CO2 21 04/04/2022   GLUCOSE 237 (H) 04/04/2022    Lab Results  Component Value Date/Time   HGBA1C 7.3 (H) 04/04/2022 03:01 PM   HGBA1C 7.0 (H) 12/12/2021 09:03 AM   MICROALBUR 9.4 04/06/2020 09:40 AM   MICROALBUR 21.1  06/20/2019 02:20 PM    Last diabetic Eye exam:  Lab Results  Component Value Date/Time   HMDIABEYEEXA No Retinopathy 03/09/2021 07:53 AM    Last diabetic Foot exam: No results found for: "HMDIABFOOTEX"   Lab Results  Component Value Date   CHOL 66 04/04/2022   HDL 29 (L) 04/04/2022   LDLCALC 11 04/04/2022   TRIG 188 (H) 04/04/2022   CHOLHDL 2.3 04/04/2022       Latest Ref Rng & Units 04/04/2022    3:01 PM 05/23/2021    2:46 PM 05/12/2021    9:26 PM  Hepatic Function  Total Protein 6.1 - 8.1 g/dL 6.4  5.9  7.2   Albumin 3.5 - 5.0 g/dL   4.1   AST 10 - 35 U/L 123  28  32   ALT 6 - 29 U/L 84  22  45   Alk Phosphatase 38 - 126 U/L   50   Total Bilirubin 0.2 - 1.2 mg/dL 0.3  0.3  0.4     Lab Results  Component Value Date/Time   TSH 1.72 04/04/2022 03:01 PM   TSH 3.71 05/23/2021 02:46 PM   FREET4 1.08 07/17/2013 02:58 PM       Latest Ref Rng & Units 04/04/2022    3:01 PM 05/23/2021    2:46 PM 05/16/2021    4:20 AM  CBC  WBC 3.8 - 10.8 Thousand/uL 12.8  6.9  6.8   Hemoglobin  11.7 - 15.5 g/dL 13.3  13.9  13.3   Hematocrit 35.0 - 45.0 % 40.4  43.2  41.9   Platelets 140 - 400 Thousand/uL 405  286  192     Lab Results  Component Value Date/Time   VITAMINB12 697 07/17/2013 02:58 PM    Clinical ASCVD: {YES/NO:21197} The ASCVD Risk score (Arnett DK, et al., 2019) failed to calculate for the following reasons:   The 2019 ASCVD risk score is only valid for ages 6 to 58    ***Other: (CHADS2VASc if Afib, MMRC or CAT for COPD, ACT, DEXA)     04/04/2022    2:30 PM 12/01/2021    3:30 PM 07/09/2020   10:07 AM  Depression screen PHQ 2/9  Decreased Interest 0 0 0  Down, Depressed, Hopeless 0 0 0  PHQ - 2 Score 0 0 0     Social History   Tobacco Use  Smoking Status Former   Packs/day: 1.50   Years: 20.00   Total pack years: 30.00   Types: Cigarettes   Quit date: 02/13/1986   Years since quitting: 36.1  Smokeless Tobacco Never   BP Readings from Last 3 Encounters:   04/04/22 (!) 102/56  03/28/22 130/86  02/10/22 (!) 144/62   Pulse Readings from Last 3 Encounters:  04/04/22 79  03/28/22 67  02/10/22 78   Wt Readings from Last 3 Encounters:  04/04/22 130 lb (59 kg)  03/28/22 119 lb (54 kg)  12/01/21 145 lb 9.6 oz (66 kg)   BMI Readings from Last 3 Encounters:  04/04/22 24.56 kg/m  03/28/22 22.48 kg/m  12/01/21 28.44 kg/m    Allergies  Allergen Reactions   Neomycin Other (See Comments)    Visual disturbance and swelling in eyes    Medications Reviewed Today     Reviewed by Chriss Driver, LPN (Licensed Practical Nurse) on 04/04/22 at 1425  Med List Status: <None>   Medication Order Taking? Sig Documenting Provider Last Dose Status Informant  albuterol (VENTOLIN HFA) 108 (90 Base) MCG/ACT inhaler [id number] Yes Inhale 2 puffs into the lungs every 6 (six) hours as needed for wheezing or shortness of breath (Cough). Lynden Oxford Scales, PA-C Taking Active   Calcium Carbonate-Vitamin D (CALCIUM 600 + D PO) [id number] Yes Take 1 tablet by mouth daily. [provider] Taking Active Self  gabapentin (NEURONTIN) 600 MG tablet [id number] Yes TAKE 1 TABLET BY MOUTH 4 TIMES DAILY Pickard, Cammie Mcgee, MD Taking Active   guaifenesin (HUMIBID E) 400 MG TABS tablet [id number] No Take 1 tablet 3 times daily as needed for chest congestion and cough  Patient not taking: Reported on 03/28/2022   Lynden Oxford Scales, PA-C Not Taking Active   isosorbide mononitrate (IMDUR) 60 MG 24 hr tablet [id number] Yes TAKE 1 TABLET BY MOUTH ONCE DAILY [name], MD Taking Active   LANTUS SOLOSTAR 100 UNIT/ML Solostar Pen [id number] No INJECT 40-50 UNITS UNDER THE SKIN ONCE EVERY NIGHT AT BEDTIME.  Patient not taking: Reported on 04/04/2022   Susy Frizzle, MD Not Taking Active   metFORMIN (GLUCOPHAGE) 500 MG tablet [id number] Yes TAKE 1 TABLET BY MOUTH TWICE (2) DAILY WITH A MEAL Susy Frizzle, MD Taking Active   metoprolol tartrate (LOPRESSOR)  25 MG tablet [id number] Yes TAKE 1 TABLET BY MOUTH TWICE A DAY [name], MD Taking Active   Multiple Vitamin (MULTIVITAMIN) capsule [id number] Yes Take 1 capsule by mouth daily. [provider] Taking Active Self  Omega-3  Fatty Acids (FISH OIL) 1000 MG CAPS [id number] Yes Take 1 capsule by mouth daily. [provider] Taking Active Self  omeprazole (PRILOSEC) 20 MG capsule [id number] Yes Take 20 mg by mouth daily. [provider] Taking Active Self  ondansetron (ZOFRAN-ODT) 4 MG disintegrating tablet [id number] No Take 1 tablet (4 mg total) by mouth every 8 (eight) hours as needed for nausea or vomiting.  Patient not taking: Reported on 03/28/2022   Lynden Oxford Scales, PA-C Not Taking Active   [facility] VERIO test strip [id number] Yes USE AS DIRECTED TO MONITOR BLOOD SUGARS UP TO 3 TIMES DAILY AS NEEDED Susy Frizzle, MD Taking Active   potassium chloride (KLOR-CON) 10 MEQ tablet [id number] Yes TAKE 1 TABLET BY MOUTH TWICE A DAY Susy Frizzle, MD Taking Active   promethazine-dextromethorphan (PROMETHAZINE-DM) 6.25-15 MG/5ML syrup [id number] No Take 2.5 mLs by mouth at bedtime as needed for cough.  Patient not taking: Reported on 04/04/2022   [name], FNP Not Taking Active   rosuvastatin (CRESTOR) 40 MG tablet [id number] Yes TAKE ONE TABLET BY MOUTH ONCE DAILY [name], MD Taking Active   torsemide ([place]) 20 MG tablet [id number] Yes TAKE 1 TABLET BY MOUTH ONCE DAILY Dennard Schaumann Cammie Mcgee, MD Taking Active             SDOH:  (Social Determinants of Health) assessments and interventions performed: {yes/no:20286} SDOH Interventions    Flowsheet Row Clinical Support from 07/09/2020 in [place] Interventions   Food Insecurity Interventions Intervention Not Indicated  Housing Interventions Intervention Not Indicated  Transportation Interventions Intervention Not Indicated  Financial Strain Interventions Intervention Not  Indicated  Physical Activity Interventions Intervention Not Indicated  Stress Interventions Intervention Not Indicated  Social Connections Interventions Intervention Not Indicated       Medication Assistance: {MEDASSISTANCEINFO:25044}  Medication Access: Within the past 30 days, how often has patient missed a dose of medication? *** Is a pillbox or other method used to improve adherence? {YES/NO:21197} Factors that may affect medication adherence? {CHL DESC; BARRIERS:21522} Are meds synced by current pharmacy? {YES/NO:21197} Are meds delivered by current pharmacy? {YES/NO:21197} Does patient experience delays in picking up medications due to transportation concerns? {YES/NO:21197}  [facility] Reviewed: Is patient disadvantaged to use [facility]?: {YES/NO:21197} Current Rx insurance plan: *** Name and location of Current pharmacy:  [facility] - [address] [place] [address] Phone: [phone] Fax: [place] [place] [address] Phone: [phone] Fax: [phone]  [facility] services reviewed with patient today?: {YES/NO:21197} Patient requests to transfer care to [facility]?: {YES/NO:21197} Reason patient declined to change pharmacies: {US patient preference:27474}  Compliance/Adherence/Medication fill history: [facility] Drugs:  Metformin 500 mg last filled 02/16/2022 90 DS Rosuvastatin 40 mg last filled 02/16/2022 90 DS     Care Gaps: Annual wellness visit in last year? No   If Diabetic: Last eye exam / retinopathy screening: 03/09/2021 Last diabetic foot exam: 04/05/2020   Assessment/Plan   Hypertension (BP goal {CHL [facility] Pharmacist BP ranges:[phone]}) -{US controlled/uncontrolled:25276} -Current treatment: *** -Medications previously tried: ***  -Current home  readings: *** -Current dietary habits: *** -Current exercise habits: *** -{ACTIONS;DENIES/REPORTS:21021675::"Denies"} hypotensive/hypertensive symptoms -Educated on {CCM BP Counseling:25124} -Counseled to monitor BP at home ***, document, and provide log at future appointments -{CCMPHARMDINTERVENTION:25122}  Hyperlipidemia: (LDL goal < ***) -{US controlled/uncontrolled:25276} -Current treatment: *** -  Medications previously tried: ***  -Current dietary patterns: *** -Current exercise habits: *** -Educated on {CCM HLD Counseling:25126} -{CCMPHARMDINTERVENTION:25122}  Diabetes (A1c goal {A1c goals:23924}) -{US controlled/uncontrolled:25276} -Current medications: *** -Medications previously tried: ***  -Current home glucose readings fasting glucose: *** post prandial glucose: *** -{ACTIONS;DENIES/REPORTS:21021675::"Denies"} hypoglycemic/hyperglycemic symptoms -Current meal patterns:  breakfast: ***  lunch: ***  dinner: *** snacks: *** drinks: *** -Current exercise: *** -Educated on {CCM DM COUNSELING:25123} -Counseled to check feet daily and get yearly eye exams -{CCMPHARMDINTERVENTION:25122}  Edema (Goal: ***) -{US controlled/uncontrolled:25276} -Current treatment  *** -Medications previously tried: ***  -{CCMPHARMDINTERVENTION:25122}  ***

## 2022-04-06 NOTE — Telephone Encounter (Signed)
Per Diane at [facility]:  Acute minimally displaced obliquely oriented fracture of the distal fibula with extension to the distal tib-fib joint.  Report is in patient's chart. Thank you.

## 2022-04-06 NOTE — Progress Notes (Signed)
Subjective:    Patient ID: April [name], female    DOB: Jul 21, 1940, 82 y.o.   MRN: [id number] 04/04/22 Patient is a very pleasant 82 year old Caucasian female who recently lost her husband.  She presents today acutely ill.  She is confused.  She appears very dehydrated.  She is falling at home.  She has been coughing recently.  Urinalysis today shows +2 blood, positive nitrates, +2 leukocyte esterase.  Urine is cloudy.  She also has been coughing and on examination she has left basilar crackles on exam.  Family states that she is more confused.  Blood pressure is borderline low.  However she is afebrile.  At that time, my plan was:  Patient appears clinically dehydrated.  Discontinue torsemide and potassium.  Try to push fluids.  Reduce metoprolol by 50% due to low blood pressure.  Recheck the patient on Thursday.  Meanwhile treat urinary tract infection with Rocephin 1 g IM x 1 now.  Use Levaquin 500 mg daily for 7 days to cover urinary tract infection and what I suspect may be pneumonia in the left lower lobe.  Meanwhile check baseline lab work.  Get A1c, TSH, CBC, CMP.  Avoid Advil.  Use Tylenol for pain.  Discontinue metformin as I suspect the patient may be in acute renal insufficiency.  04/06/22 Office Visit on 04/04/2022  Component Date Value Ref Range Status   Color, Urine 04/04/2022 YELLOW  YELLOW Final   APPearance 04/04/2022 CLOUDY (A)  CLEAR Final   Specific Gravity, Urine 04/04/2022 1.020  1.001 - 1.035 Final   pH 04/04/2022 6.0  5.0 - 8.0 Final   Glucose, UA 04/04/2022 NEGATIVE  NEGATIVE Final   Bilirubin Urine 04/04/2022 NEGATIVE  NEGATIVE Final   Ketones, ur 04/04/2022 NEGATIVE  NEGATIVE Final   Hgb urine dipstick 04/04/2022 2+ (A)  NEGATIVE Final   Protein, ur 04/04/2022 3+ (A)  NEGATIVE Final   Nitrite 04/04/2022 POSITIVE (A)  NEGATIVE Final   Leukocytes,Ua 04/04/2022 2+ (A)  NEGATIVE Final   WBC, UA 04/04/2022 > OR = 60 (A)  0 - 5 /HPF Final   RBC / HPF 04/04/2022 10-20  (A)  0 - 2 /HPF Final   Squamous Epithelial / HPF 04/04/2022 0-5  < OR = 5 /HPF Final   Bacteria, UA 04/04/2022 MANY (A)  NONE SEEN /HPF Final   Hyaline Cast 04/04/2022 NONE SEEN  NONE SEEN /LPF Final   WBC 04/04/2022 12.8 (H)  3.8 - 10.8 Thousand/uL Final   RBC 04/04/2022 4.60  3.80 - 5.10 Million/uL Final   Hemoglobin 04/04/2022 13.3  11.7 - 15.5 g/dL Final   HCT 04/04/2022 40.4  35.0 - 45.0 % Final   MCV 04/04/2022 87.8  80.0 - 100.0 fL Final   MCH 04/04/2022 28.9  27.0 - 33.0 pg Final   MCHC 04/04/2022 32.9  32.0 - 36.0 g/dL Final   RDW 04/04/2022 13.3  11.0 - 15.0 % Final   Platelets 04/04/2022 405 (H)  140 - 400 Thousand/uL Final   MPV 04/04/2022 10.9  7.5 - 12.5 fL Final   Neutro Abs 04/04/2022 10,790 (H)  1,500 - 7,800 cells/uL Final   Lymphs Abs 04/04/2022 1,101  850 - 3,900 cells/uL Final   Absolute Monocytes 04/04/2022 870  200 - 950 cells/uL Final   Eosinophils Absolute 04/04/2022 13 (L)  15 - 500 cells/uL Final   Basophils Absolute 04/04/2022 26  0 - 200 cells/uL Final   Neutrophils Relative % 04/04/2022 84.3  % Final  Total Lymphocyte 04/04/2022 8.6  % Final   Monocytes Relative 04/04/2022 6.8  % Final   Eosinophils Relative 04/04/2022 0.1  % Final   Basophils Relative 04/04/2022 0.2  % Final   Glucose, Bld 04/04/2022 237 (H)  65 - 99 mg/dL Final   Comment: .            Fasting reference interval . For someone without known diabetes, a glucose value >125 mg/dL indicates that they may have diabetes and this should be confirmed with a follow-up test. .    BUN 04/04/2022 28 (H)  7 - 25 mg/dL Final   Creat 04/04/2022 0.96 (H)  0.60 - 0.95 mg/dL Final   eGFR 04/04/2022 60  > OR = 60 mL/min/1.27m Final   BUN/Creatinine Ratio 04/04/2022 29 (H)  6 - 22 (calc) Final   Sodium 04/04/2022 131 (L)  135 - 146 mmol/L Final   Potassium 04/04/2022 4.0  3.5 - 5.3 mmol/L Final   Chloride 04/04/2022 97 (L)  98 - 110 mmol/L Final   CO2 04/04/2022 21  20 - 32 mmol/L Final    Calcium 04/04/2022 10.1  8.6 - 10.4 mg/dL Final   Total Protein 04/04/2022 6.4  6.1 - 8.1 g/dL Final   Albumin 04/04/2022 2.8 (L)  3.6 - 5.1 g/dL Final   Globulin 04/04/2022 3.6  1.9 - 3.7 g/dL (calc) Final   AG Ratio 04/04/2022 0.8 (L)  1.0 - 2.5 (calc) Final   Total Bilirubin 04/04/2022 0.3  0.2 - 1.2 mg/dL Final   Alkaline phosphatase (APISO) 04/04/2022 110  37 - 153 U/L Final   AST 04/04/2022 123 (H)  10 - 35 U/L Final   ALT 04/04/2022 84 (H)  6 - 29 U/L Final   Cholesterol 04/04/2022 66  <200 mg/dL Final   HDL 04/04/2022 29 (L)  > OR = 50 mg/dL Final   Triglycerides 04/04/2022 188 (H)  <150 mg/dL Final   LDL Cholesterol (Calc) 04/04/2022 11  mg/dL (calc) Final   Comment: Reference range: <100 . Desirable range <100 mg/dL for primary prevention;   <70 mg/dL for patients with CHD or diabetic patients  with > or = 2 CHD risk factors. .Marland KitchenLDL-C is now calculated using the Martin-Hopkins  calculation, which is a validated novel method providing  better accuracy than the Friedewald equation in the  estimation of LDL-C.  MCresenciano Genreet al. JAnnamaria Helling [id number]: [id number]  (http://education.[facility].com/faq/FAQ164)    Total CHOL/HDL Ratio 04/04/2022 2.3  <5.0 (calc) Final   Non-HDL Cholesterol (Calc) 04/04/2022 37  <130 mg/dL (calc) Final   Comment: For patients with diabetes plus 1 major ASCVD risk  factor, treating to a non-HDL-C goal of <100 mg/dL  (LDL-C of <70 mg/dL) is considered a therapeutic  option.    Hgb A1c MFr Bld 04/04/2022 7.3 (H)  <5.7 % of total Hgb Final   Comment: For someone without known diabetes, a hemoglobin A1c value of 6.5% or greater indicates that they may have  diabetes and this should be confirmed with a follow-up  test. . For someone with known diabetes, a value <7% indicates  that their diabetes is well controlled and a value  greater than or equal to 7% indicates suboptimal  control. A1c targets should be individualized based on  duration of  diabetes, age, comorbid conditions, and  other considerations. . Currently, no consensus exists regarding use of hemoglobin A1c for diagnosis of diabetes for children. .    Mean Plasma Glucose 04/04/2022 163  mg/dL Final  eAG (mmol/L) 04/04/2022 9.0  mmol/L Final   Comment: . HbA1c performed on Roche platform. Effective 11/21/21 a change in test platforms may have  shifted HbA1c results compared to historical results.    TSH 04/04/2022 1.72  0.40 - 4.50 mIU/L Final   Creatinine, Urine 04/04/2022 47  20 - 275 mg/dL Final   Protein/Creat Ratio 04/04/2022 4,000 (H)  24 - 184 mg/g creat Final   Protein/Creatinine Ratio 04/04/2022 4.000 (H)  0.024 - 0.184 mg/mg creat Final   Total Protein, Urine 04/04/2022 188 (H)  5 - 24 mg/dL Final   Note 04/04/2022    Final   Comment: This urine was analyzed for the presence of WBC,  RBC, bacteria, casts, and other formed elements.  Only those elements seen were reported. Sallee Provencal NUMBER: 04/04/2022 [id number]   Preliminary   SPECIMEN QUALITY: 04/04/2022 Adequate   Preliminary   Sample Source 04/04/2022 URINE, CLEAN CATCH   Preliminary   STATUS: 04/04/2022 PRELIMINARY   Preliminary   ISOLATE 1: 04/04/2022 Escherichia coli (A)   Preliminary   Greater than 100,000 CFU/mL of Escherichia coli , susceptibility test report to follow.   Patient states she feels a little bit better.  Urine culture is growing E. coli.  Sensitivities are not back yet patient continued to have thick rattling cough.  She continues to have left basilar crackles.  I suspect community-acquired pneumonia.  Lab also showed elevated liver function test.  Today, the patient is more lucid.  She is more engaged during our encounter.  She states that she is having pain in her right ankle over the lateral malleolus.  She injured this when she fell a week ago.  She is extremely tender to palpation over the lateral malleolus.  Any movement elicits pain in the ankle.  There is also bruising  and swelling.  Past Medical History:  Diagnosis Date   Abnormal weight gain    Arthritis    "joints" (06/13/2013)   COPD (chronic obstructive pulmonary disease) (HCC)    Coronary artery disease, non-occlusive 06/2013   Cardiac Cath: sharp Angle take-off of Large Dominant Cx: ~70-80% mid Cx bifurcation lesion (Lateral OM &  AVGCx-PL-PDA both with hairpin ostial & ~50% lesions) - Not PCI amenable due to vessel tortuosity; ~40-50% mid LAD;Ramus - no significnat diseaes; small non-dominant RCA   Diverticulosis    GERD (gastroesophageal reflux disease)    History of stress test    a. 09/2006 nl Dobutamine Echo   Hyperlipidemia    Hypertension    NIDDM (non-insulin dependent diabetes mellitus)    On home oxygen therapy    "2L q hs; runs into my BIPAP" (06/13/2013)   OSA (obstructive sleep apnea)    "BIPAP w/O2" (06/13/2013)   Tracheobronchomalacia    a. followed by [facility]   Past Surgical History:  Procedure Laterality Date   CARDIAC CATHETERIZATION  06/2013   Cardiac Cath: sharp Angle take-off of Large Dominant Cx: ~70-80% mid Cx bifurcation lesion (Lateral OM &  AVGCx-PL-PDA both with hairpin ostial & ~50% lesions) - Not PCI amenable due to vessel tortuosity; ~40-50% mid LAD;Ramus - no significnat diseaes; small non-dominant RCA   LEFT HEART CATHETERIZATION WITH CORONARY ANGIOGRAM N/A 06/16/2013   Procedure: LEFT HEART CATHETERIZATION WITH CORONARY ANGIOGRAM;  Surgeon: [name], MD;  Location: [facility] CATH LAB;  Service: Cardiovascular;  Laterality: N/A;   TRANSTHORACIC ECHOCARDIOGRAM  06/17/2013   Normal LV size and function. EF 55-60% with no regional WMA. Grade  1 diastolic dysfunction. No significant valvular lesions   TUBAL LIGATION     Current Outpatient Medications on File Prior to Visit  Medication Sig Dispense Refill   albuterol (VENTOLIN HFA) 108 (90 Base) MCG/ACT inhaler Inhale 2 puffs into the lungs every 6 (six) hours as needed for wheezing or shortness of breath (Cough). 18  g 2   Calcium Carbonate-Vitamin D (CALCIUM 600 + D PO) Take 1 tablet by mouth daily.     gabapentin (NEURONTIN) 600 MG tablet TAKE 1 TABLET BY MOUTH 4 TIMES DAILY 360 tablet 0   guaifenesin (HUMIBID E) 400 MG TABS tablet Take 1 tablet 3 times daily as needed for chest congestion and cough (Patient not taking: Reported on 03/28/2022) 21 tablet 0   isosorbide mononitrate (IMDUR) 60 MG 24 hr tablet TAKE 1 TABLET BY MOUTH ONCE DAILY 90 tablet 3   LANTUS SOLOSTAR 100 UNIT/ML Solostar Pen INJECT 40-50 UNITS UNDER THE SKIN ONCE EVERY NIGHT AT BEDTIME. (Patient not taking: Reported on 04/04/2022) 15 mL 3   levofloxacin (LEVAQUIN) 500 MG tablet Take 1 tablet (500 mg total) by mouth daily for 7 days. Please ignore previous bactrim prescription 7 tablet 0   metFORMIN (GLUCOPHAGE) 500 MG tablet TAKE 1 TABLET BY MOUTH TWICE (2) DAILY WITH A MEAL 180 tablet 3   metoprolol tartrate (LOPRESSOR) 25 MG tablet TAKE 1 TABLET BY MOUTH TWICE A DAY 180 tablet 3   Multiple Vitamin (MULTIVITAMIN) capsule Take 1 capsule by mouth daily.     Omega-3 Fatty Acids (FISH OIL) 1000 MG CAPS Take 1 capsule by mouth daily.     omeprazole (PRILOSEC) 20 MG capsule Take 20 mg by mouth daily.     ondansetron (ZOFRAN-ODT) 4 MG disintegrating tablet Take 1 tablet (4 mg total) by mouth every 8 (eight) hours as needed for nausea or vomiting. (Patient not taking: Reported on 03/28/2022) 20 tablet 0   ONETOUCH VERIO test strip USE AS DIRECTED TO MONITOR BLOOD SUGARS UP TO 3 TIMES DAILY AS NEEDED 100 each 3   potassium chloride (KLOR-CON) 10 MEQ tablet TAKE 1 TABLET BY MOUTH TWICE A DAY 60 tablet 0   promethazine-dextromethorphan (PROMETHAZINE-DM) 6.25-15 MG/5ML syrup Take 2.5 mLs by mouth at bedtime as needed for cough. (Patient not taking: Reported on 04/04/2022) 60 mL 0   rosuvastatin (CRESTOR) 40 MG tablet TAKE ONE TABLET BY MOUTH ONCE DAILY 90 tablet 3   torsemide (DEMADEX) 20 MG tablet TAKE 1 TABLET BY MOUTH ONCE DAILY 90 tablet 0   No  current facility-administered medications on file prior to visit.   Allergies  Allergen Reactions   Neomycin Other (See Comments)    Visual disturbance and swelling in eyes   Social History   Socioeconomic History   Marital status: Married    Spouse name: Not on file   Number of children: 3   Years of education: Not on file   Highest education level: Not on file  Occupational History   Occupation: Systems analyst  Tobacco Use   Smoking status: Former    Packs/day: 1.50    Years: 20.00    Total pack years: 30.00    Types: Cigarettes    Quit date: 02/13/1986    Years since quitting: 36.1   Smokeless tobacco: Never  Substance and Sexual Activity   Alcohol use: No   Drug use: No   Sexual activity: Yes  Other Topics Concern   Not on file  Social History Narrative   Lives in [place] with her  husband. 3 children.  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   Former 30 Pk/yr Smoker (1.5 PPD x 20 yr) - quit in 1998.   Social Determinants of Health   Financial Resource Strain: Low Risk  (07/09/2020)   Overall Financial Resource Strain (CARDIA)    Difficulty of Paying Living Expenses: Not hard at all  Food Insecurity: No Food Insecurity (07/09/2020)   Hunger Vital Sign    Worried About Running Out of Food in the Last Year: Never true    Ran Out of Food in the Last Year: Never true  Transportation Needs: No Transportation Needs (07/09/2020)   PRAPARE - Hydrologist (Medical): No    Lack of Transportation (Non-Medical): No  Physical Activity: Inactive (07/09/2020)   Exercise Vital Sign    Days of Exercise per Week: 0 days    Minutes of Exercise per Session: 0 min  Stress: No Stress Concern Present (07/09/2020)   [place]    Feeling of Stress : Not at all  Social Connections: Moderately Integrated (07/09/2020)   Social Connection and Isolation Panel [NHANES]    Frequency of Communication with  Friends and Family: More than three times a week    Frequency of Social Gatherings with Friends and Family: More than three times a week    Attends Religious Services: More than 4 times per year    Active Member of [employer] or Organizations: No    Attends Archivist Meetings: Never    Marital Status: Married  Human resources officer Violence: Not At Risk (07/09/2020)   Humiliation, Afraid, Rape, and Kick questionnaire    Fear of Current or Ex-Partner: No    Emotionally Abused: No    Physically Abused: No    Sexually Abused: No      Review of Systems  Musculoskeletal:  Positive for back pain.  All other systems reviewed and are negative.      Objective:   Physical Exam Constitutional:      General: She is not in acute distress.    Appearance: Normal appearance. She is obese. She is not ill-appearing, toxic-appearing or diaphoretic.  HENT:     Head: Normocephalic and atraumatic.     Right Ear: External ear normal. There is no impacted cerumen.     Left Ear: External ear normal. There is no impacted cerumen.     Nose: Nose normal. No congestion or rhinorrhea.     Mouth/Throat:     Mouth: Mucous membranes are moist.     Pharynx: Oropharynx is clear.  Eyes:     General: No scleral icterus.       Right eye: No discharge.        Left eye: No discharge.     Extraocular Movements: Extraocular movements intact.     Conjunctiva/sclera: Conjunctivae normal.     Pupils: Pupils are equal, round, and reactive to light.  Neck:     Vascular: No carotid bruit.  Cardiovascular:     Rate and Rhythm: Normal rate and regular rhythm.     Pulses: Normal pulses.     Heart sounds: Murmur heard.     No friction rub. No gallop.  Pulmonary:     Effort: Pulmonary effort is normal. No respiratory distress.     Breath sounds: No stridor. Rales present. No wheezing or rhonchi.  Chest:     Chest wall: No tenderness.  Abdominal:     General: Abdomen is flat.  Bowel sounds are normal. There is no  distension.     Palpations: Abdomen is soft.     Tenderness: There is no abdominal tenderness. There is no right CVA tenderness, left CVA tenderness, guarding or rebound.     Hernia: No hernia is present.  Musculoskeletal:        General: Tenderness and signs of injury present.     Cervical back: Normal range of motion and neck supple. No rigidity.     Right lower leg: No edema.     Left lower leg: No edema.  Lymphadenopathy:     Cervical: No cervical adenopathy.  Skin:    Findings: No bruising, erythema, lesion or rash.  Neurological:     Mental Status: She is alert.     Gait: Gait abnormal.     Deep Tendon Reflexes: Reflexes normal.           Assessment & Plan:  Acute right ankle pain - Plan: DG Ankle Complete Right  Elevated LFTs - Plan: COMPLETE METABOLIC PANEL WITH GFR  Community acquired pneumonia of left lower lobe of lung - Plan: DG Chest 2 View I believe the patient has community-acquired pneumonia.  The Levaquin should give adequate coverage for this as well.  Meanwhile obtain a chest x-ray to evaluate further.  I am concerned that she may have suffered a fracture in her lateral malleolus of her right ankle.  I will get an x-ray of this.  I recommended a cam walker until we have the x-ray results back.  Patient had elevated liver function test I believe due to the acuity of her illness.  I recommended holding rosuvastatin.  Repeat CMP today and if persistently elevated will get right upper quadrant ultrasound.  Continue Levaquin for UTI and await for culture and sensitivity.  Resume torsemide in 48 hours.  Resume potassium when she resumes torsemide.  Continue to hold metformin until blood sugars are greater than 160.  At the present time she still having hypoglycemic episodes.

## 2022-04-07 LAB — COMPLETE METABOLIC PANEL WITH GFR
AG Ratio: 0.7 (calc) — ABNORMAL LOW (ref 1.0–2.5)
ALT: 420 U/L — ABNORMAL HIGH (ref 6–29)
AST: 588 U/L — ABNORMAL HIGH (ref 10–35)
Albumin: 2.9 g/dL — ABNORMAL LOW (ref 3.6–5.1)
Alkaline phosphatase (APISO): 145 U/L (ref 37–153)
BUN: 18 mg/dL (ref 7–25)
CO2: 27 mmol/L (ref 20–32)
Calcium: 10.8 mg/dL — ABNORMAL HIGH (ref 8.6–10.4)
Chloride: 99 mmol/L (ref 98–110)
Creat: 0.79 mg/dL (ref 0.60–0.95)
Globulin: 4 g/dL (calc) — ABNORMAL HIGH (ref 1.9–3.7)
Glucose, Bld: 235 mg/dL — ABNORMAL HIGH (ref 65–99)
Potassium: 4.2 mmol/L (ref 3.5–5.3)
Sodium: 136 mmol/L (ref 135–146)
Total Bilirubin: 0.3 mg/dL (ref 0.2–1.2)
Total Protein: 6.9 g/dL (ref 6.1–8.1)
eGFR: 76 mL/min/{1.73_m2} (ref >=60)

## 2022-04-11 ENCOUNTER — Other Ambulatory Visit: Payer: Self-pay

## 2022-04-11 DIAGNOSIS — R7989 Other specified abnormal findings of blood chemistry: Secondary | ICD-10-CM

## 2022-04-11 DIAGNOSIS — M25579 Pain in unspecified ankle and joints of unspecified foot: Secondary | ICD-10-CM

## 2022-04-18 ENCOUNTER — Other Ambulatory Visit: Payer: Self-pay

## 2022-04-18 ENCOUNTER — Inpatient Hospital Stay (HOSPITAL_COMMUNITY)
Admission: EM | Admit: 2022-04-18 | Discharge: 2022-04-20 | DRG: 175 | Disposition: A | Discharge status: Home Health | Payer: Medicare Other | Attending: Internal Medicine | Admitting: Internal Medicine

## 2022-04-18 ENCOUNTER — Emergency Department (HOSPITAL_COMMUNITY): Payer: Medicare Other

## 2022-04-18 ENCOUNTER — Telehealth: Payer: Self-pay

## 2022-04-18 ENCOUNTER — Ambulatory Visit
Admission: RE | Admit: 2022-04-18 | Discharge: 2022-04-18 | Disposition: A | Discharge status: Home / Self Care | Payer: Medicare Other | Source: Ambulatory Visit | Attending: Family Medicine | Admitting: Family Medicine

## 2022-04-18 ENCOUNTER — Other Ambulatory Visit (HOSPITAL_COMMUNITY): Payer: Self-pay

## 2022-04-18 ENCOUNTER — Encounter (HOSPITAL_COMMUNITY): Payer: Self-pay | Admitting: Emergency Medicine

## 2022-04-18 ENCOUNTER — Emergency Department (HOSPITAL_BASED_OUTPATIENT_CLINIC_OR_DEPARTMENT_OTHER): Payer: Medicare Other

## 2022-04-18 DIAGNOSIS — S32019S Unspecified fracture of first lumbar vertebra, sequela: Secondary | ICD-10-CM | POA: Diagnosis not present

## 2022-04-18 DIAGNOSIS — I2510 Atherosclerotic heart disease of native coronary artery without angina pectoris: Secondary | ICD-10-CM | POA: Diagnosis present

## 2022-04-18 DIAGNOSIS — J440 Chronic obstructive pulmonary disease with acute lower respiratory infection: Secondary | ICD-10-CM | POA: Diagnosis present

## 2022-04-18 DIAGNOSIS — Z8249 Family history of ischemic heart disease and other diseases of the circulatory system: Secondary | ICD-10-CM | POA: Diagnosis not present

## 2022-04-18 DIAGNOSIS — J209 Acute bronchitis, unspecified: Secondary | ICD-10-CM | POA: Diagnosis present

## 2022-04-18 DIAGNOSIS — E119 Type 2 diabetes mellitus without complications: Secondary | ICD-10-CM

## 2022-04-18 DIAGNOSIS — Z794 Long term (current) use of insulin: Secondary | ICD-10-CM | POA: Diagnosis not present

## 2022-04-18 DIAGNOSIS — K828 Other specified diseases of gallbladder: Secondary | ICD-10-CM | POA: Diagnosis not present

## 2022-04-18 DIAGNOSIS — J9601 Acute respiratory failure with hypoxia: Secondary | ICD-10-CM | POA: Diagnosis present

## 2022-04-18 DIAGNOSIS — E039 Hypothyroidism, unspecified: Secondary | ICD-10-CM | POA: Diagnosis not present

## 2022-04-18 DIAGNOSIS — I2699 Other pulmonary embolism without acute cor pulmonale: Secondary | ICD-10-CM

## 2022-04-18 DIAGNOSIS — K5730 Diverticulosis of large intestine without perforation or abscess without bleeding: Secondary | ICD-10-CM | POA: Diagnosis not present

## 2022-04-18 DIAGNOSIS — I2609 Other pulmonary embolism with acute cor pulmonale: Secondary | ICD-10-CM | POA: Diagnosis not present

## 2022-04-18 DIAGNOSIS — K8020 Calculus of gallbladder without cholecystitis without obstruction: Secondary | ICD-10-CM

## 2022-04-18 DIAGNOSIS — Z825 Family history of asthma and other chronic lower respiratory diseases: Secondary | ICD-10-CM | POA: Diagnosis not present

## 2022-04-18 DIAGNOSIS — Z87440 Personal history of urinary (tract) infections: Secondary | ICD-10-CM

## 2022-04-18 DIAGNOSIS — Z87891 Personal history of nicotine dependence: Secondary | ICD-10-CM | POA: Diagnosis not present

## 2022-04-18 DIAGNOSIS — K219 Gastro-esophageal reflux disease without esophagitis: Secondary | ICD-10-CM | POA: Diagnosis not present

## 2022-04-18 DIAGNOSIS — Z8701 Personal history of pneumonia (recurrent): Secondary | ICD-10-CM | POA: Diagnosis not present

## 2022-04-18 DIAGNOSIS — Z832 Family history of diseases of the blood and blood-forming organs and certain disorders involving the immune mechanism: Secondary | ICD-10-CM | POA: Diagnosis not present

## 2022-04-18 DIAGNOSIS — E785 Hyperlipidemia, unspecified: Secondary | ICD-10-CM | POA: Diagnosis not present

## 2022-04-18 DIAGNOSIS — R7989 Other specified abnormal findings of blood chemistry: Secondary | ICD-10-CM

## 2022-04-18 DIAGNOSIS — I10 Essential (primary) hypertension: Secondary | ICD-10-CM | POA: Diagnosis present

## 2022-04-18 DIAGNOSIS — J9811 Atelectasis: Secondary | ICD-10-CM | POA: Diagnosis not present

## 2022-04-18 DIAGNOSIS — Z803 Family history of malignant neoplasm of breast: Secondary | ICD-10-CM

## 2022-04-18 DIAGNOSIS — R059 Cough, unspecified: Secondary | ICD-10-CM | POA: Diagnosis not present

## 2022-04-18 DIAGNOSIS — K449 Diaphragmatic hernia without obstruction or gangrene: Secondary | ICD-10-CM | POA: Diagnosis not present

## 2022-04-18 DIAGNOSIS — Z888 Allergy status to other drugs, medicaments and biological substances status: Secondary | ICD-10-CM

## 2022-04-18 DIAGNOSIS — M1990 Unspecified osteoarthritis, unspecified site: Secondary | ICD-10-CM | POA: Diagnosis present

## 2022-04-18 DIAGNOSIS — Z7984 Long term (current) use of oral hypoglycemic drugs: Secondary | ICD-10-CM

## 2022-04-18 DIAGNOSIS — G4733 Obstructive sleep apnea (adult) (pediatric): Secondary | ICD-10-CM | POA: Diagnosis not present

## 2022-04-18 DIAGNOSIS — R062 Wheezing: Secondary | ICD-10-CM

## 2022-04-18 DIAGNOSIS — Z833 Family history of diabetes mellitus: Secondary | ICD-10-CM

## 2022-04-18 DIAGNOSIS — J9611 Chronic respiratory failure with hypoxia: Secondary | ICD-10-CM

## 2022-04-18 DIAGNOSIS — Z79899 Other long term (current) drug therapy: Secondary | ICD-10-CM

## 2022-04-18 DIAGNOSIS — J9809 Other diseases of bronchus, not elsewhere classified: Secondary | ICD-10-CM | POA: Diagnosis present

## 2022-04-18 DIAGNOSIS — E1169 Type 2 diabetes mellitus with other specified complication: Secondary | ICD-10-CM | POA: Diagnosis present

## 2022-04-18 LAB — CBC WITH DIFFERENTIAL/PLATELET
Abs Immature Granulocytes: 0.02 10*3/uL (ref 0.00–0.07)
Basophils Absolute: 0 10*3/uL (ref 0.0–0.1)
Basophils Relative: 0 %
Eosinophils Absolute: 0.1 10*3/uL (ref 0.0–0.5)
Eosinophils Relative: 1 %
HCT: 40.3 % (ref 36.0–46.0)
Hemoglobin: 13.1 g/dL (ref 12.0–15.0)
Immature Granulocytes: 0 %
Lymphocytes Relative: 23 %
Lymphs Abs: 1.8 10*3/uL (ref 0.7–4.0)
MCH: 30 pg (ref 26.0–34.0)
MCHC: 32.5 g/dL (ref 30.0–36.0)
MCV: 92.2 fL (ref 80.0–100.0)
Monocytes Absolute: 0.6 10*3/uL (ref 0.1–1.0)
Monocytes Relative: 8 %
Neutro Abs: 5.1 10*3/uL (ref 1.7–7.7)
Neutrophils Relative %: 68 %
Platelets: 221 10*3/uL (ref 150–400)
RBC: 4.37 MIL/uL (ref 3.87–5.11)
RDW: 18.3 % — ABNORMAL HIGH (ref 11.5–15.5)
WBC: 7.6 10*3/uL (ref 4.0–10.5)
nRBC: 0 % (ref 0.0–0.2)

## 2022-04-18 LAB — COMPREHENSIVE METABOLIC PANEL
ALT: 21 U/L (ref 0–44)
AST: 35 U/L (ref 15–41)
Albumin: 2.5 g/dL — ABNORMAL LOW (ref 3.5–5.0)
Alkaline Phosphatase: 98 U/L (ref 38–126)
Anion gap: 14 (ref 5–15)
BUN: 10 mg/dL (ref 8–23)
CO2: 31 mmol/L (ref 22–32)
Calcium: 10.6 mg/dL — ABNORMAL HIGH (ref 8.9–10.3)
Chloride: 91 mmol/L — ABNORMAL LOW (ref 98–111)
Creatinine, Ser: 0.51 mg/dL (ref 0.44–1.00)
GFR, Estimated: >60 mL/min (ref >60)
Glucose, Bld: 199 mg/dL — ABNORMAL HIGH (ref 70–99)
Potassium: 3.7 mmol/L (ref 3.5–5.1)
Sodium: 136 mmol/L (ref 135–145)
Total Bilirubin: 0.6 mg/dL (ref 0.3–1.2)
Total Protein: 6.9 g/dL (ref 6.5–8.1)

## 2022-04-18 LAB — TROPONIN I (HIGH SENSITIVITY): Troponin I (High Sensitivity): 7 ng/L (ref <18)

## 2022-04-18 MED ORDER — CEFDINIR 300 MG PO CAPS
300.0000 mg | ORAL_CAPSULE | Freq: Two times a day (BID) | ORAL | Status: DC
  Administered 2022-04-19 – 2022-04-20 (×4): 300 mg via ORAL
  Filled 2022-04-18 (×6): qty 1

## 2022-04-18 MED ORDER — IPRATROPIUM-ALBUTEROL 0.5-2.5 (3) MG/3ML IN SOLN
3.0000 mL | Freq: Once | RESPIRATORY_TRACT | Status: AC
  Administered 2022-04-19: 3 mL via RESPIRATORY_TRACT
  Filled 2022-04-18: qty 3

## 2022-04-18 MED ORDER — HEPARIN (PORCINE) 25000 UT/250ML-% IV SOLN
800.0000 [IU]/h | INTRAVENOUS | Status: DC
  Administered 2022-04-18: 900 [IU]/h via INTRAVENOUS
  Administered 2022-04-19: 950 [IU]/h via INTRAVENOUS
  Filled 2022-04-18 (×2): qty 250

## 2022-04-18 MED ORDER — HEPARIN BOLUS VIA INFUSION
4000.0000 [IU] | Freq: Once | INTRAVENOUS | Status: AC
  Administered 2022-04-18: 4000 [IU] via INTRAVENOUS
  Filled 2022-04-18: qty 4000

## 2022-04-18 MED ORDER — PREDNISONE 20 MG PO TABS
40.0000 mg | ORAL_TABLET | Freq: Every day | ORAL | Status: DC
  Administered 2022-04-19 – 2022-04-20 (×2): 40 mg via ORAL
  Filled 2022-04-18 (×2): qty 2

## 2022-04-18 MED ORDER — IOPAMIDOL (ISOVUE-300) INJECTION 61%
100.0000 mL | Freq: Once | INTRAVENOUS | Status: AC | PRN
  Administered 2022-04-18: 100 mL via INTRAVENOUS

## 2022-04-18 NOTE — ED Provider Triage Note (Signed)
Emergency Medicine Provider Triage Evaluation Note  April [name] , a 82 y.o. female  was evaluated in triage.  Pt complains of abnormal CT.  Patient was last come to emergency department after an incidental finding of pulmonary embolisms noted on CT abdomen pelvis that was performed earlier today. No prior history of clots. Not currently on a blood thinner. Has  been experiencing shortness of breath for the last 3 weeks.  Review of Systems  Positive: As above Negative: As above  Physical Exam  BP 125/62 (BP Location: Right Arm)   Pulse 96   Temp 98.9 F (37.2 C)   Resp 20   Ht 5' (1.524 m)   Wt 54 kg   LMP  (LMP Unknown)   SpO2 98%   BMI 23.24 kg/m  Gen:   Awake, no distress   Resp: Inc. work of breathing, placed on 2 L of O2 in triage due to hypoxia MSK:   Moves extremities without difficulty  Other:    Medical Decision Making  Medically screening exam initiated at 5:39 PM.  Appropriate orders placed.  April [name] was informed that the remainder of the evaluation will be completed by another provider, this initial triage assessment does not replace that evaluation, and the importance of remaining in the ED until their evaluation is complete.     Luvenia Heller, PA-C 04/18/22 1746

## 2022-04-18 NOTE — ED Notes (Signed)
ED TO INPATIENT HANDOFF REPORT  ED Nurse Name and Phone #:   Antionette Poles RN [phone]  S Name/Age/Gender April [name] 82 y.o. female Room/Bed: 032C/032C  Code Status   Code Status: Full Code  Home/SNF/Other Home Patient oriented to: self, place, time, and situation Is this baseline? Yes   Triage Complete: Triage complete  Chief Complaint Acute pulmonary embolism [facility]) [I26.99]  Triage Note Pt sent from imaging center for pulmonary embolism and possible infected gallbladder shown on CT scan done today. Recent dx of pneumonia - finished course of antibiotics. Endorses shortness of breath. Hypoxic in triage at 83% on room air.    Allergies Allergies  Allergen Reactions   Neomycin Other (See Comments)    Visual disturbance and swelling in eyes    Level of Care/Admitting Diagnosis ED Disposition     ED Disposition  Admit   Condition  --   Comment  Hospital Area: [place] [[id number]]  Level of Care: Telemetry Medical [104]  May place patient in observation at [facility] or [place] if equivalent level of care is available:: No  Covid Evaluation: Asymptomatic - no recent exposure (last 10 days) testing not required  Diagnosis: Acute pulmonary embolism [facility]) [id number]  Admitting Physician: Bridgett Larsson, [place]  Attending Physician: Bridgett Larsson, ERIC [[id number]]          B Medical/Surgery History Past Medical History:  Diagnosis Date   Abnormal weight gain    Acute diverticulitis 05/13/2021   Arthritis    "joints" (06/13/2013)   COPD (chronic obstructive pulmonary disease) ([place])    Coronary artery disease, non-occlusive 06/13/2013   Cardiac Cath: sharp Angle take-off of Large Dominant Cx: ~70-80% mid Cx bifurcation lesion (Lateral OM &  AVGCx-PL-PDA both with hairpin ostial & ~50% lesions) - Not PCI amenable due to vessel tortuosity; ~40-50% mid LAD;Ramus - no significnat diseaes; small non-dominant RCA   Diverticulosis    GERD (gastroesophageal reflux  disease)    History of stress test    a. 09/2006 nl Dobutamine Echo   Hyperlipidemia    Hypertension    NIDDM (non-insulin dependent diabetes mellitus)    On home oxygen therapy    "2L q hs; runs into my BIPAP" (06/13/2013)   OSA (obstructive sleep apnea)    "BIPAP w/O2" (06/13/2013)   Tracheobronchomalacia    a. followed by [facility].   Past Surgical History:  Procedure Laterality Date   CARDIAC CATHETERIZATION  06/2013   Cardiac Cath: sharp Angle take-off of Large Dominant Cx: ~70-80% mid Cx bifurcation lesion (Lateral OM &  AVGCx-PL-PDA both with hairpin ostial & ~50% lesions) - Not PCI amenable due to vessel tortuosity; ~40-50% mid LAD;Ramus - no significnat diseaes; small non-dominant RCA   LEFT HEART CATHETERIZATION WITH CORONARY ANGIOGRAM N/A 06/16/2013   Procedure: LEFT HEART CATHETERIZATION WITH CORONARY ANGIOGRAM;  Surgeon: [name], MD;  Location: [facility] CATH LAB;  Service: Cardiovascular;  Laterality: N/A;   TRANSTHORACIC ECHOCARDIOGRAM  06/17/2013   Normal LV size and function. EF 55-60% with no regional WMA. Grade 1 diastolic dysfunction. No significant valvular lesions   TUBAL LIGATION       A IV Location/Drains/Wounds Patient Lines/Drains/Airways Status     Active Line/Drains/Airways     Name Placement date Placement time Site Days   Peripheral IV 04/18/22 20 G 1" Anterior;Right Forearm 04/18/22  1845  Forearm  less than 1            Intake/Output Last 24 hours No intake or  output data in the 24 hours ending 04/18/22 2335  Labs/Imaging Results for orders placed or performed during the hospital encounter of 04/18/22 (from the past 48 hour(s))  Troponin I (High Sensitivity)     Status: None   Collection Time: 04/18/22  6:29 PM  Result Value Ref Range   Troponin I (High Sensitivity) 7 <18 ng/L    Comment: (NOTE) Elevated high sensitivity troponin I (hsTnI) values and significant  changes across serial measurements may suggest ACS but many other  chronic  and acute conditions are known to elevate hsTnI results.  Refer to the "Links" section for chest pain algorithms and additional  guidance. Performed at [facility], [place] [address] [place], [place] [address]   Comprehensive metabolic panel     Status: Abnormal   Collection Time: 04/18/22  6:29 PM  Result Value Ref Range   Sodium 136 135 - 145 mmol/L   Potassium 3.7 3.5 - 5.1 mmol/L   Chloride 91 (L) 98 - 111 mmol/L   CO2 31 22 - 32 mmol/L   Glucose, Bld 199 (H) 70 - 99 mg/dL    Comment: Glucose reference range applies only to samples taken after fasting for at least 8 hours.   BUN 10 8 - 23 mg/dL   Creatinine, Ser 0.51 0.44 - 1.00 mg/dL   Calcium 10.6 (H) 8.9 - 10.3 mg/dL   Total Protein 6.9 6.5 - 8.1 g/dL   Albumin 2.5 (L) 3.5 - 5.0 g/dL   AST 35 15 - 41 U/L   ALT 21 0 - 44 U/L   Alkaline Phosphatase 98 38 - 126 U/L   Total Bilirubin 0.6 0.3 - 1.2 mg/dL   GFR, Estimated >60 >60 mL/min    Comment: (NOTE) Calculated using the CKD-EPI Creatinine Equation (2021)    Anion gap 14 5 - 15    Comment: Performed at [facility], [place] [address], [place], [place] [address]  CBC with Differential     Status: Abnormal   Collection Time: 04/18/22  9:30 PM  Result Value Ref Range   WBC 7.6 4.0 - 10.5 K/uL   RBC 4.37 3.87 - 5.11 MIL/uL   Hemoglobin 13.1 12.0 - 15.0 g/dL   HCT 40.3 36.0 - 46.0 %   MCV 92.2 80.0 - 100.0 fL   MCH 30.0 26.0 - 34.0 pg   MCHC 32.5 30.0 - 36.0 g/dL   RDW 18.3 (H) 11.5 - 15.5 %   Platelets 221 150 - 400 K/uL   nRBC 0.0 0.0 - 0.2 %   Neutrophils Relative % 68 %   Neutro Abs 5.1 1.7 - 7.7 K/uL   Lymphocytes Relative 23 %   Lymphs Abs 1.8 0.7 - 4.0 K/uL   Monocytes Relative 8 %   Monocytes Absolute 0.6 0.1 - 1.0 K/uL   Eosinophils Relative 1 %   Eosinophils Absolute 0.1 0.0 - 0.5 K/uL   Basophils Relative 0 %   Basophils Absolute 0.0 0.0 - 0.1 K/uL   Immature Granulocytes 0 %   Abs Immature Granulocytes 0.02 0.00 - 0.07 K/uL    Comment:  Performed at [facility] [address], [place], [place] [address]   VAS [place] LOWER EXTREMITY VENOUS (DVT) (7a-7p)  Result Date: 04/18/2022  Lower Venous DVT Study Patient Name:  MARGREE LACKMAN Trang  Date of Exam:   04/18/2022 Medical Rec #: [id number]       Accession #:    [id number] Date of Birth: 10-Sep-1940  Patient Gender: F Patient Age:   89 years Exam Location:  [facility] Procedure:      VAS [place] LOWER EXTREMITY VENOUS (DVT) Referring Phys: JON KNAPP --------------------------------------------------------------------------------  Indications: Pulmonary embolism.  Limitations: Poor ultrasound/tissue interface. Comparison Study: No previous exams Performing Technologist: Jody Hill RVT, RDMS  Examination Guidelines: A complete evaluation includes B-mode imaging, spectral Doppler, color Doppler, and power Doppler as needed of all accessible portions of each vessel. Bilateral testing is considered an integral part of a complete examination. Limited examinations for reoccurring indications may be performed as noted. The reflux portion of the exam is performed with the patient in reverse Trendelenburg.  +---------+---------------+---------+-----------+----------+--------------+ RIGHT    CompressibilityPhasicitySpontaneityPropertiesThrombus Aging +---------+---------------+---------+-----------+----------+--------------+ CFV      Full           Yes      Yes                                 +---------+---------------+---------+-----------+----------+--------------+ SFJ      Full                                                        +---------+---------------+---------+-----------+----------+--------------+ FV Prox  Full           Yes      Yes                                 +---------+---------------+---------+-----------+----------+--------------+ FV Mid   Full           Yes      Yes                                  +---------+---------------+---------+-----------+----------+--------------+ FV DistalFull           Yes      Yes                                 +---------+---------------+---------+-----------+----------+--------------+ PFV      Full                                                        +---------+---------------+---------+-----------+----------+--------------+ POP      Full           Yes      Yes                                 +---------+---------------+---------+-----------+----------+--------------+ PTV      Full                                                        +---------+---------------+---------+-----------+----------+--------------+ PERO     Full                                                        +---------+---------------+---------+-----------+----------+--------------+   +---------+---------------+---------+-----------+----------+--------------+  LEFT     CompressibilityPhasicitySpontaneityPropertiesThrombus Aging +---------+---------------+---------+-----------+----------+--------------+ CFV      Full           Yes      Yes                                 +---------+---------------+---------+-----------+----------+--------------+ SFJ      Full                                                        +---------+---------------+---------+-----------+----------+--------------+ FV Prox  Full           Yes      Yes                                 +---------+---------------+---------+-----------+----------+--------------+ FV Mid   Full           Yes      Yes                                 +---------+---------------+---------+-----------+----------+--------------+ FV DistalFull           Yes      Yes                                 +---------+---------------+---------+-----------+----------+--------------+ PFV      Full                                                         +---------+---------------+---------+-----------+----------+--------------+ POP      Full           Yes      Yes                                 +---------+---------------+---------+-----------+----------+--------------+ PTV      Full                                                        +---------+---------------+---------+-----------+----------+--------------+ PERO     Full                                                        +---------+---------------+---------+-----------+----------+--------------+     Summary: BILATERAL: - No evidence of deep vein thrombosis seen in the lower extremities, bilaterally. -No evidence of popliteal cyst, bilaterally.   *See table(s) above for measurements and observations. Electronically signed by Harold Barban MD on 04/18/2022 at 10:09:01 PM.    Final    DG Chest Portable 1 View  Result Date: 04/18/2022 CLINICAL DATA:  Cough  and known pulmonary embolism EXAM: PORTABLE CHEST 1 VIEW COMPARISON:  04/06/2018 FINDINGS: Cardiac shadow is stable. Aortic calcifications are noted. Lungs are hypoinflated with mild right basilar atelectasis. No bony abnormality is noted. IMPRESSION: Mild right basilar atelectasis. Electronically Signed   By: Inez Catalina M.D.   On: 04/18/2022 19:34   US Abdomen Limited  Result Date: 04/18/2022 CLINICAL DATA:  History of cholelithiasis EXAM: ULTRASOUND ABDOMEN LIMITED RIGHT UPPER QUADRANT COMPARISON:  CT from earlier in the same day. FINDINGS: Gallbladder: Gallbladder is well distended with multiple gallstones within. The wall is mildly thickened at 3.8 mm. Negative sonographic Murphy's sign is noted however. Gallbladder sludge is seen. No pericholecystic fluid is seen. Common bile duct: Diameter: 5.9 mm. Liver: No focal lesion identified. Within normal limits in parenchymal echogenicity. Portal vein is patent on color Doppler imaging with normal direction of blood flow towards the liver. Other: None. IMPRESSION: Cholelithiasis and  gallbladder sludge without complicating factors. Electronically Signed   By: Inez Catalina M.D.   On: 04/18/2022 19:31   CT Abdomen Pelvis W Contrast  Result Date: 04/18/2022 CLINICAL DATA:  Upper abdominal and back pain. Fall 3 weeks ago. Elevated liver function tests EXAM: CT ABDOMEN AND PELVIS WITH CONTRAST TECHNIQUE: Multidetector CT imaging of the abdomen and pelvis was performed using the standard protocol following bolus administration of intravenous contrast. RADIATION DOSE REDUCTION: This exam was performed according to the departmental dose-optimization program which includes automated exposure control, adjustment of the mA and/or kV according to patient size and/or use of iterative reconstruction technique. CONTRAST:  167m ISOVUE-300 IOPAMIDOL (ISOVUE-300) INJECTION 61% COMPARISON:  CT 05/12/2021 FINDINGS: Lower chest: There are reticular changes along the lung bases with some ground-glass and more confluent opacity in the right lower lobe. Atelectasis versus infiltrate. All the changes are increased from the prior CT scan. No pleural effusion. Bilateral pulmonary emboli are noted at the edge of the imaging field. Moderate hiatal hernia. Hepatobiliary: No space-occupying liver lesion. Patent portal vein. The gallbladder is distended with sludge and stones. Please correlate with any symptoms. There is some pericholecystic fluid. Pancreas: Unremarkable. No pancreatic ductal dilatation or surrounding inflammatory changes. Spleen: Normal in size without focal abnormality. Adrenals/Urinary Tract: Adrenal glands are preserved. No enhancing renal mass or collecting system dilatation. There is heterogeneous enhancement of the kidneys bilaterally. No inflammatory stranding. This would have a differential including former nephritis, inflammatory or infectious. The ureters have normal course and caliber down to the bladder. Preserved contours of the urinary bladder. Stomach/Bowel: The large bowel has extensive  colonic diverticulosis in particular along the sigmoid colon with some circular muscle hypertrophy. Normal appendix in the right lower quadrant. The large bowel on this non dilated. Small bowel is nondilated. Stomach is underdistended. Vascular/Lymphatic: Normal caliber aorta and IVC with some scattered vascular calcifications. No specific abnormal lymph node enlargement identified in the abdomen and pelvis. There are a few small prominent upper abdominal nodes are stable towards the porta hepatis and portacaval region. Reproductive: Uterus and bilateral adnexa are unremarkable. Other: No ascites. Musculoskeletal: Curvature of the spine. Scattered degenerative changes. There is severe compression of the L1 vertebral body with sclerosis. Please correlate with the recent lumbar spine MRI of 04/01/2022. IMPRESSION: Incidental lower lung bilateral pulmonary emboli. Increasing lung base opacities.  Breathing motion. Dilated gallbladder with stones appears also adjacent fluid. Please correlate for signs of acute cholecystitis and if needed confirmatory study such as ultrasound and HIDA scan. Heterogeneous enhancement of both kidneys. There is no significant adjacent stranding.  Differential includes form of nephritis. Infectious, inflammatory. Please correlate with clinical presentation in any recent treatment for UTI. Colonic diverticulosis without obstruction. Moderate hiatal hernia. Critical Value/emergent results were called by telephone at the time of interpretation on 04/18/2022 at 12:17 pm to provider [facility] , who verbally acknowledged these results. Electronically Signed   By: [name] M.D.   On: 04/18/2022 15:33    Pending Labs Unresulted Labs (From admission, onward)     Start     Ordered   04/19/22 0500  CBC  Daily,   R      04/18/22 1846   04/19/22 0300  Heparin level (unfractionated)  Once-Timed,   URGENT        04/18/22 1846   Signed and Held  CBC with Differential/Platelet  Tomorrow  morning,   R        Signed and Held            Vitals/Pain Today's Vitals   04/18/22 1915 04/18/22 2000 04/18/22 2113 04/18/22 2201  BP: (!) 119/56 (!) 124/53 (!) 132/50   Pulse: 90 89 91   Resp: 20 20 (!) 21   Temp:    98 F (36.7 C)  TempSrc:    Oral  SpO2: 99% 100% 97%   Weight:      Height:      PainSc:        Isolation Precautions No active isolations  Medications Medications  heparin ADULT infusion 100 units/mL (25000 units/228m) (900 Units/hr Intravenous New Bag/Given 04/18/22 1908)  ipratropium-albuterol (DUONEB) 0.5-2.5 (3) MG/3ML nebulizer solution 3 mL (has no administration in time range)  predniSONE (DELTASONE) tablet 40 mg (has no administration in time range)  cefdinir (OMNICEF) capsule 300 mg (has no administration in time range)  heparin bolus via infusion 4,000 Units (4,000 Units Intravenous Bolus from Bag 04/18/22 1908)    Mobility walks     Focused Assessments Cardiac Assessment Handoff:    Lab Results  Component Value Date   CKTOTAL 53 08/17/2006   CKMB 1.2 08/17/2006   TROPONINI <0.30 06/16/2013   Lab Results  Component Value Date   DDIMER  08/17/2006    <0.22        AT THE INHOUSE ESTABLISHED CUTOFF VALUE OF 0.48 ug/mL FEU, THIS ASSAY HAS BEEN DOCUMENTED IN THE LITERATURE TO HAVE   Does the Patient currently have chest pain? No    R Recommendations: See Admitting Provider Note  Report given to:   Additional Notes:

## 2022-04-18 NOTE — ED Notes (Signed)
Called lab to follow up on unresulted CBC. Was notified by lab they had not received specimen. This RN recollected CBC and sent to lab for testing. MD Notified

## 2022-04-18 NOTE — Assessment & Plan Note (Addendum)
Seen on CT abd and RUQ U/S. No abd pain at present. Did have elevated LFTs about 2 weeks ago. Dtr-in-law states pt was taking 1500 mg of tylenol every 4 hours at home for several days. Possible unintentional tylenol overdose. Regardless, LFTs are back to normal now. Pt and family aware that daily maximum of tylenol is 4000 mg/day when used acutely. Long term tylenol usage should be kept to 3000 mg/day.

## 2022-04-18 NOTE — ED Notes (Signed)
Shift report received, assumed care of patient.

## 2022-04-18 NOTE — Telephone Encounter (Signed)
Call from Dr. Lyndel Safe at [facility]. Pt has pulmonary embolus and possible infected gallbladder. Per Dr. Dennard Schaumann, pt will need to go to ER to be admitted for Heparin and IV antibiotics. Advised pt's daughter in law, Ebony Hail, who verbalized understanding of all and will take the patient to [place]. Mjp,lpn

## 2022-04-18 NOTE — Progress Notes (Signed)
ANTICOAGULATION CONSULT NOTE - Initial Consult  Pharmacy Consult for heparin Indication: pulmonary embolus  Allergies  Allergen Reactions   Neomycin Other (See Comments)    Visual disturbance and swelling in eyes   Patient Measurements: Height: 5' (152.4 cm) Weight: 54 kg (119 lb) IBW/kg (Calculated) : 45.5 kg Heparin Dosing Weight: 54 kg  Vital Signs: Temp: 98.9 F (37.2 C) (03/05 1721) BP: 125/62 (03/05 1721) Pulse Rate: 96 (03/05 1721)  Labs: No results for input(s): "HGB", "HCT", "PLT", "APTT", "LABPROT", "INR", "HEPARINUNFRC", "HEPRLOWMOCWT", "CREATININE", "CKTOTAL", "CKMB", "TROPONINIHS" in the last 72 hours.  Estimated Creatinine Clearance: 40.3 mL/min (by C-G formula based on SCr of 0.79 mg/dL).  Medical History: Past Medical History:  Diagnosis Date   Abnormal weight gain    Arthritis    "joints" (06/13/2013)   COPD (chronic obstructive pulmonary disease) ([facility])    Coronary artery disease, non-occlusive 06/2013   Cardiac Cath: sharp Angle take-off of Large Dominant Cx: ~70-80% mid Cx bifurcation lesion (Lateral OM &  AVGCx-PL-PDA both with hairpin ostial & ~50% lesions) - Not PCI amenable due to vessel tortuosity; ~40-50% mid LAD;Ramus - no significnat diseaes; small non-dominant RCA   Diverticulosis    GERD (gastroesophageal reflux disease)    History of stress test    a. 09/2006 nl Dobutamine Echo   Hyperlipidemia    Hypertension    NIDDM (non-insulin dependent diabetes mellitus)    On home oxygen therapy    "2L q hs; runs into my BIPAP" (06/13/2013)   OSA (obstructive sleep apnea)    "BIPAP w/O2" (06/13/2013)   Tracheobronchomalacia    a. followed by [facility].   Assessment: GA is a [name] admitted for PE. No anticoagulation prior to admission. CBC pending.   Goal of Therapy:  Heparin level 0.3-0.7 units/ml Monitor platelets by anticoagulation protocol: Yes   Plan:  Give 4000 units bolus x 1 Start heparin infusion at 900 units/hr Check anti-Xa level  in 8 hours and daily while on heparin Continue to monitor H&H and platelets  Jeneen Rinks [id number] PM

## 2022-04-18 NOTE — Assessment & Plan Note (Signed)
Pt not on home O2. Continue with supplemental O2. Wean to RA as tolerated.

## 2022-04-18 NOTE — Assessment & Plan Note (Addendum)
Treated for UTI/pneumonia 2 weeks ago with 5-7 days of levaquin 500 mg. Still with persistent cough/wheezing. Will start po prednisone and duonebs. CT abd shows possible infiltrate/atelectasis. Pt completed levaquin on 04-12-2022. Will start on po omnicef.

## 2022-04-18 NOTE — Assessment & Plan Note (Signed)
Observation med/tele bed. IV heparin gtts. Check echo. LE U/S negative for DVT. No prior hx of VTE. Not taking hormones. Quit smoking in the 1980s. No personal hx of cancer.

## 2022-04-18 NOTE — H&P (Signed)
History and Physical    Debara Fugere Busenbark [id number] DOB: Oct 06, 1940 DOA: 04/18/2022  DOS: the patient was seen and examined on 04/18/2022  PCP: Susy Frizzle, MD   Patient coming from: Home  I have personally briefly reviewed patient's old medical records in [place]  CC: pulmonary embolism HPI: 82 year old female history of hypertension, OSA on BiPAP, type 2 diabetes on insulin, hyperlipidemia, hypothyroidism, history of tracheobronchomalacia, coronary disease who presents to the ER today after having an outpatient CAT scan for further workup of elevated LFTs.  She was noted to have an incidental basilar pulmonary embolism.  Patient was seen in the family practice office about 2 weeks ago for dysuria.  Treated with p.o. Levaquin.  Urine cultures grew out E. coli that was sensitive to Levaquin.  She also had a cough at that time.  They told her that she may have had pneumonia.  Levaquin also treated.  She had labs drawn which showed elevated LFTs.  She repeated LFTs and they still were increasing.  She was scheduled for an outpatient CT scan.  This was performed as an outpatient today.  CT abdomen pelvis demonstrated bilateral lower lobe emboli.  CT scan also showed gallstones with a distended gallbladder and biliary sludge.  Right upper quadrant ultrasound demonstrated distended gallbladder with multiple stones wall was about 3.8 mm.  Negative sonographic Murphy sign.  There was no pericholecystic fluid seen.  Common bile duct was 5.9 mm.  Lower extremity ultrasounds were performed.  Negative for DVT bilaterally.  Room air saturations were 88% patient started on 3 L of oxygen.  [facility] hospitalist contacted for admission.   ED Course: RUQ shows gallstones, LE U/S negative for DVT  Review of Systems:  Review of Systems  Constitutional:  Positive for malaise/fatigue.  HENT: Negative.    Eyes: Negative.   Respiratory:  Positive for cough, shortness of breath and wheezing.    Cardiovascular: Negative.   Gastrointestinal: Negative.   Genitourinary: Negative.   Musculoskeletal:  Positive for back pain.       Chronic back pain  Skin: Negative.   Neurological: Negative.   Endo/Heme/Allergies: Negative.   Psychiatric/Behavioral: Negative.    All other systems reviewed and are negative.   Past Medical History:  Diagnosis Date   Abnormal weight gain    Acute diverticulitis 05/13/2021   Arthritis    "joints" (06/13/2013)   COPD (chronic obstructive pulmonary disease) (HCC)    Coronary artery disease, non-occlusive 06/13/2013   Cardiac Cath: sharp Angle take-off of Large Dominant Cx: ~70-80% mid Cx bifurcation lesion (Lateral OM &  AVGCx-PL-PDA both with hairpin ostial & ~50% lesions) - Not PCI amenable due to vessel tortuosity; ~40-50% mid LAD;Ramus - no significnat diseaes; small non-dominant RCA   Diverticulosis    GERD (gastroesophageal reflux disease)    History of stress test    a. 09/2006 nl Dobutamine Echo   Hyperlipidemia    Hypertension    NIDDM (non-insulin dependent diabetes mellitus)    On home oxygen therapy    "2L q hs; runs into my BIPAP" (06/13/2013)   OSA (obstructive sleep apnea)    "BIPAP w/O2" (06/13/2013)   Tracheobronchomalacia    a. followed by [facility]    Past Surgical History:  Procedure Laterality Date   CARDIAC CATHETERIZATION  06/2013   Cardiac Cath: sharp Angle take-off of Large Dominant Cx: ~70-80% mid Cx bifurcation lesion (Lateral OM &  AVGCx-PL-PDA both with hairpin ostial & ~50% lesions) - Not PCI amenable  due to vessel tortuosity; ~40-50% mid LAD;Ramus - no significnat diseaes; small non-dominant RCA   LEFT HEART CATHETERIZATION WITH CORONARY ANGIOGRAM N/A 06/16/2013   Procedure: LEFT HEART CATHETERIZATION WITH CORONARY ANGIOGRAM;  Surgeon: [name], MD;  Location: [facility] C[facility];  Service: Cardiovascular;  Laterality: N/A;   TRANSTHORACIC ECHOCARDIOGRAM  06/17/2013   Normal LV size and function. EF 55-60% with no  regional WMA. Grade 1 diastolic dysfunction. No significant valvular lesions   TUBAL LIGATION       reports that she quit smoking about 36 years ago. Her smoking use included cigarettes. She has a 30.00 pack-year smoking history. She has never used smokeless tobacco. She reports that she does not drink alcohol and does not use drugs.  Allergies  Allergen Reactions   Neomycin Other (See Comments)    Visual disturbance and swelling in eyes    Family History  Problem Relation Age of Onset   Heart disease Mother        developed coronary dzs in her 45's.   Clotting disorder Mother    Breast cancer Sister 56   Diabetes Paternal Aunt    Asthma Maternal Grandmother    Heart disease Maternal Grandmother    Heart disease Maternal Grandfather    Diabetes Brother    CAD Brother        s/p cabg in his 17's   CAD Brother        s/p cabg in his 72's.    Prior to Admission medications   Medication Sig Start Date End Date Taking? Authorizing Provider  albuterol (VENTOLIN HFA) 108 (90 Base) MCG/ACT inhaler Inhale 2 puffs into the lungs every 6 (six) hours as needed for wheezing or shortness of breath (Cough). 02/10/22   Lynden Oxford Scales, PA-C  Calcium Carbonate-Vitamin D (CALCIUM 600 + D PO) Take 1 tablet by mouth daily.    [provider]  escitalopram (LEXAPRO) 10 MG tablet Take 1 tablet (10 mg total) by mouth daily. 04/06/22   Susy Frizzle, MD  gabapentin (NEURONTIN) 600 MG tablet TAKE 1 TABLET BY MOUTH 4 TIMES DAILY 02/16/22   Susy Frizzle, MD  guaifenesin (HUMIBID E) 400 MG TABS tablet Take 1 tablet 3 times daily as needed for chest congestion and cough Patient not taking: Reported on 03/28/2022 02/10/22   Lynden Oxford Scales, PA-C  isosorbide mononitrate (IMDUR) 60 MG 24 hr tablet TAKE 1 TABLET BY MOUTH ONCE DAILY 05/23/21   [name], MD  LANTUS SOLOSTAR 100 UNIT/ML Solostar Pen INJECT 40-50 UNITS UNDER THE SKIN ONCE EVERY NIGHT AT BEDTIME. Patient not  taking: Reported on 04/04/2022 01/23/22   Susy Frizzle, MD  metFORMIN (GLUCOPHAGE) 500 MG tablet TAKE 1 TABLET BY MOUTH TWICE (2) DAILY WITH A MEAL 09/02/21   Susy Frizzle, MD  metoprolol tartrate (LOPRESSOR) 25 MG tablet TAKE 1 TABLET BY MOUTH TWICE A DAY 07/13/21   [name], MD  Multiple Vitamin (MULTIVITAMIN) capsule Take 1 capsule by mouth daily.    [provider]  Omega-3 Fatty Acids (FISH OIL) 1000 MG CAPS Take 1 capsule by mouth daily.    [provider]  omeprazole (PRILOSEC) 20 MG capsule Take 20 mg by mouth daily.    [provider]  ondansetron (ZOFRAN-ODT) 4 MG disintegrating tablet Take 1 tablet (4 mg total) by mouth every 8 (eight) hours as needed for nausea or vomiting. Patient not taking: Reported on 03/28/2022 02/10/22   Lynden Oxford Scales, PA-C  [facility]  VERIO test strip USE AS DIRECTED TO MONITOR BLOOD SUGARS UP TO 3 TIMES DAILY AS NEEDED 10/27/21   Susy Frizzle, MD  potassium chloride (KLOR-CON) 10 MEQ tablet TAKE 1 TABLET BY MOUTH TWICE A DAY 03/22/22   Susy Frizzle, MD  promethazine-dextromethorphan (PROMETHAZINE-DM) 6.25-15 MG/5ML syrup Take 2.5 mLs by mouth at bedtime as needed for cough. Patient not taking: Reported on 04/04/2022 03/28/22   [name], FNP  rosuvastatin (CRESTOR) 40 MG tablet TAKE ONE TABLET BY MOUTH ONCE DAILY 05/23/21   [name], MD  torsemide (DEMADEX) 20 MG tablet TAKE 1 TABLET BY MOUTH ONCE DAILY 04/04/22   Susy Frizzle, MD    Physical Exam: Vitals:   04/18/22 1915 04/18/22 2000 04/18/22 2113 04/18/22 2201  BP: (!) 119/56 (!) 124/53 (!) 132/50   Pulse: 90 89 91   Resp: 20 20 (!) 21   Temp:    98 F (36.7 C)  TempSrc:    Oral  SpO2: 99% 100% 97%   Weight:      Height:        Physical Exam   Labs on Admission: I have personally reviewed following labs and imaging studies  CBC: Recent Labs  Lab 04/18/22 2130  WBC 7.6  NEUTROABS 5.1  HGB 13.1  HCT 40.3  MCV 92.2   PLT [id number]   Basic Metabolic Panel: Recent Labs  Lab 04/18/22 1829  NA 136  K 3.7  CL 91*  CO2 31  GLUCOSE 199*  BUN 10  CREATININE 0.51  CALCIUM 10.6*   GFR: Estimated Creatinine Clearance: 40.3 mL/min (by C-G formula based on SCr of 0.51 mg/dL). Liver Function Tests: Recent Labs  Lab 04/18/22 1829  AST 35  ALT 21  ALKPHOS 98  BILITOT 0.6  PROT 6.9  ALBUMIN 2.5*   No results for input(s): "LIPASE", "AMYLASE" in the last 168 hours. No results for input(s): "AMMONIA" in the last 168 hours. Coagulation Profile: No results for input(s): "INR", "PROTIME" in the last 168 hours. Cardiac Enzymes: Recent Labs  Lab 04/18/22 1829  TROPONINIHS 7   BNP (last 3 results) No results for input(s): "BNP" in the last 8760 hours. HbA1C: No results for input(s): "HGBA1C" in the last 72 hours. CBG: No results for input(s): "GLUCAP" in the last 168 hours. Lipid Profile: No results for input(s): "CHOL", "HDL", "LDLCALC", "TRIG", "CHOLHDL", "LDLDIRECT" in the last 72 hours. Thyroid Function Tests: No results for input(s): "TSH", "T4TOTAL", "FREET4", "T3FREE", "THYROIDAB" in the last 72 hours. Anemia Panel: No results for input(s): "VITAMINB12", "FOLATE", "FERRITIN", "TIBC", "IRON", "RETICCTPCT" in the last 72 hours. Urine analysis:    Component Value Date/Time   COLORURINE YELLOW 04/04/2022 1421   APPEARANCEUR CLOUDY (A) 04/04/2022 1421   LABSPEC 1.020 04/04/2022 1421   PHURINE 6.0 04/04/2022 1421   GLUCOSEU NEGATIVE 04/04/2022 1421   HGBUR 2+ (A) 04/04/2022 1421   BILIRUBINUR NEGATIVE 05/12/2021 2126   KETONESUR NEGATIVE 04/04/2022 1421   PROTEINUR 3+ (A) 04/04/2022 1421   NITRITE POSITIVE (A) 04/04/2022 1421   LEUKOCYTESUR 2+ (A) 04/04/2022 1421    Radiological Exams on Admission: I have personally reviewed images VAS [place] LOWER EXTREMITY VENOUS (DVT) (7a-7p)  Result Date: 04/18/2022  Lower Venous DVT Study Patient Name:  DANESHA HOLL Klus  Date of Exam:   04/18/2022 Medical Rec  #: [id number]       Accession #:    [id number] Date of Birth: Aug 23, 1940       Patient Gender: F Patient Age:  81 years Exam Location:  [facility] Procedure:      VAS [place] LOWER EXTREMITY VENOUS (DVT) Referring Phys: JON KNAPP --------------------------------------------------------------------------------  Indications: Pulmonary embolism.  Limitations: Poor ultrasound/tissue interface. Comparison Study: No previous exams Performing Technologist: Jody Hill RVT, RDMS  Examination Guidelines: A complete evaluation includes B-mode imaging, spectral Doppler, color Doppler, and power Doppler as needed of all accessible portions of each vessel. Bilateral testing is considered an integral part of a complete examination. Limited examinations for reoccurring indications may be performed as noted. The reflux portion of the exam is performed with the patient in reverse Trendelenburg.  +---------+---------------+---------+-----------+----------+--------------+ RIGHT    CompressibilityPhasicitySpontaneityPropertiesThrombus Aging +---------+---------------+---------+-----------+----------+--------------+ CFV      Full           Yes      Yes                                 +---------+---------------+---------+-----------+----------+--------------+ SFJ      Full                                                        +---------+---------------+---------+-----------+----------+--------------+ FV Prox  Full           Yes      Yes                                 +---------+---------------+---------+-----------+----------+--------------+ FV Mid   Full           Yes      Yes                                 +---------+---------------+---------+-----------+----------+--------------+ FV DistalFull           Yes      Yes                                 +---------+---------------+---------+-----------+----------+--------------+ PFV      Full                                                         +---------+---------------+---------+-----------+----------+--------------+ POP      Full           Yes      Yes                                 +---------+---------------+---------+-----------+----------+--------------+ PTV      Full                                                        +---------+---------------+---------+-----------+----------+--------------+ PERO     Full                                                        +---------+---------------+---------+-----------+----------+--------------+   +---------+---------------+---------+-----------+----------+--------------+  LEFT     CompressibilityPhasicitySpontaneityPropertiesThrombus Aging +---------+---------------+---------+-----------+----------+--------------+ CFV      Full           Yes      Yes                                 +---------+---------------+---------+-----------+----------+--------------+ SFJ      Full                                                        +---------+---------------+---------+-----------+----------+--------------+ FV Prox  Full           Yes      Yes                                 +---------+---------------+---------+-----------+----------+--------------+ FV Mid   Full           Yes      Yes                                 +---------+---------------+---------+-----------+----------+--------------+ FV DistalFull           Yes      Yes                                 +---------+---------------+---------+-----------+----------+--------------+ PFV      Full                                                        +---------+---------------+---------+-----------+----------+--------------+ POP      Full           Yes      Yes                                 +---------+---------------+---------+-----------+----------+--------------+ PTV      Full                                                         +---------+---------------+---------+-----------+----------+--------------+ PERO     Full                                                        +---------+---------------+---------+-----------+----------+--------------+     Summary: BILATERAL: - No evidence of deep vein thrombosis seen in the lower extremities, bilaterally. -No evidence of popliteal cyst, bilaterally.   *See table(s) above for measurements and observations. Electronically signed by Harold Barban MD on 04/18/2022 at 10:09:01 PM.    Final    DG Chest Portable 1 View  Result Date: 04/18/2022 CLINICAL DATA:  Cough  and known pulmonary embolism EXAM: PORTABLE CHEST 1 VIEW COMPARISON:  04/06/2018 FINDINGS: Cardiac shadow is stable. Aortic calcifications are noted. Lungs are hypoinflated with mild right basilar atelectasis. No bony abnormality is noted. IMPRESSION: Mild right basilar atelectasis. Electronically Signed   By: Inez Catalina M.D.   On: 04/18/2022 19:34   US Abdomen Limited  Result Date: 04/18/2022 CLINICAL DATA:  History of cholelithiasis EXAM: ULTRASOUND ABDOMEN LIMITED RIGHT UPPER QUADRANT COMPARISON:  CT from earlier in the same day. FINDINGS: Gallbladder: Gallbladder is well distended with multiple gallstones within. The wall is mildly thickened at 3.8 mm. Negative sonographic Murphy's sign is noted however. Gallbladder sludge is seen. No pericholecystic fluid is seen. Common bile duct: Diameter: 5.9 mm. Liver: No focal lesion identified. Within normal limits in parenchymal echogenicity. Portal vein is patent on color Doppler imaging with normal direction of blood flow towards the liver. Other: None. IMPRESSION: Cholelithiasis and gallbladder sludge without complicating factors. Electronically Signed   By: Inez Catalina M.D.   On: 04/18/2022 19:31   CT Abdomen Pelvis W Contrast  Result Date: 04/18/2022 CLINICAL DATA:  Upper abdominal and back pain. Fall 3 weeks ago. Elevated liver function tests EXAM: CT ABDOMEN AND PELVIS WITH  CONTRAST TECHNIQUE: Multidetector CT imaging of the abdomen and pelvis was performed using the standard protocol following bolus administration of intravenous contrast. RADIATION DOSE REDUCTION: This exam was performed according to the departmental dose-optimization program which includes automated exposure control, adjustment of the mA and/or kV according to patient size and/or use of iterative reconstruction technique. CONTRAST:  1108m ISOVUE-300 IOPAMIDOL (ISOVUE-300) INJECTION 61% COMPARISON:  CT 05/12/2021 FINDINGS: Lower chest: There are reticular changes along the lung bases with some ground-glass and more confluent opacity in the right lower lobe. Atelectasis versus infiltrate. All the changes are increased from the prior CT scan. No pleural effusion. Bilateral pulmonary emboli are noted at the edge of the imaging field. Moderate hiatal hernia. Hepatobiliary: No space-occupying liver lesion. Patent portal vein. The gallbladder is distended with sludge and stones. Please correlate with any symptoms. There is some pericholecystic fluid. Pancreas: Unremarkable. No pancreatic ductal dilatation or surrounding inflammatory changes. Spleen: Normal in size without focal abnormality. Adrenals/Urinary Tract: Adrenal glands are preserved. No enhancing renal mass or collecting system dilatation. There is heterogeneous enhancement of the kidneys bilaterally. No inflammatory stranding. This would have a differential including former nephritis, inflammatory or infectious. The ureters have normal course and caliber down to the bladder. Preserved contours of the urinary bladder. Stomach/Bowel: The large bowel has extensive colonic diverticulosis in particular along the sigmoid colon with some circular muscle hypertrophy. Normal appendix in the right lower quadrant. The large bowel on this non dilated. Small bowel is nondilated. Stomach is underdistended. Vascular/Lymphatic: Normal caliber aorta and IVC with some scattered  vascular calcifications. No specific abnormal lymph node enlargement identified in the abdomen and pelvis. There are a few small prominent upper abdominal nodes are stable towards the porta hepatis and portacaval region. Reproductive: Uterus and bilateral adnexa are unremarkable. Other: No ascites. Musculoskeletal: Curvature of the spine. Scattered degenerative changes. There is severe compression of the L1 vertebral body with sclerosis. Please correlate with the recent lumbar spine MRI of 04/01/2022. IMPRESSION: Incidental lower lung bilateral pulmonary emboli. Increasing lung base opacities.  Breathing motion. Dilated gallbladder with stones appears also adjacent fluid. Please correlate for signs of acute cholecystitis and if needed confirmatory study such as ultrasound and HIDA scan. Heterogeneous enhancement of both kidneys. There is no significant adjacent stranding.  Differential includes form of nephritis. Infectious, inflammatory. Please correlate with clinical presentation in any recent treatment for UTI. Colonic diverticulosis without obstruction. Moderate hiatal hernia. Critical Value/emergent results were called by telephone at the time of interpretation on 04/18/2022 at 12:17 pm to provider [facility] , who verbally acknowledged these results. Electronically Signed   By: [name] M.D.   On: 04/18/2022 15:33    EKG: My personal interpretation of EKG shows: no EKG to review  Assessment/Plan Principal Problem:   Acute pulmonary embolism (HCC) Active Problems:   Acute respiratory failure with hypoxia (HCC)   Wheezing   OSA treated with BiPAP   Hyperlipidemia associated with type 2 diabetes mellitus ([place])   Essential hypertension   Type 2 diabetes mellitus treated with insulin ([place])   Hypothyroid   Cholelithiasis    Assessment and Plan: * Acute pulmonary embolism ([place]) Observation med/tele bed. IV heparin gtts. Check echo. LE U/S negative for DVT. No prior hx of VTE. Not taking  hormones. Quit smoking in the 1980s. No personal hx of cancer.  Acute respiratory failure with hypoxia (HCC) Pt not on home O2. Continue with supplemental O2. Wean to RA as tolerated.  Wheezing Treated for UTI/pneumonia 2 weeks ago with 5-7 days of levaquin 500 mg. Still with persistent cough/wheezing. Will start po prednisone and duonebs. CT abd shows possible infiltrate/atelectasis. Pt completed levaquin on 04-12-2022. Will start on po omnicef.  Cholelithiasis Seen on CT abd and RUQ U/S. No abd pain at present. Did have elevated LFTs about 2 weeks ago. Dtr-in-law states pt was taking 1500 mg of tylenol every 4 hours at home for several days. Possible unintentional tylenol overdose. Regardless, LFTs are back to normal now. Pt and family aware that daily maximum of tylenol is 4000 mg/day when used acutely. Long term tylenol usage should be kept to 3000 mg/day.  Hypothyroid Stable. TSH 1.72 two weeks ago in office.  Type 2 diabetes mellitus treated with insulin ([place]) Continue with lantus. Hold metformin. Add SSI due to addition of systemic steroids.  Essential hypertension Stable.   Hyperlipidemia associated with type 2 diabetes mellitus (HCC) Stable. On crestor.  OSA treated with BiPAP Stable. Continue bipap. Hx of Tracheobronchomalacia    DVT prophylaxis: IV heparin gtts Code Status: Full Code Family Communication: discussed with pt and dtr-in-law Bryson Ha at bedside Disposition Plan: return home  Consults called: none  Admission status: Observation, Telemetry bed   Kristopher Oppenheim, DO [facility] 04/18/2022, 11:11 PM

## 2022-04-18 NOTE — Subjective & Objective (Signed)
CC: pulmonary embolism HPI: 82 year old female history of hypertension, OSA on BiPAP, type 2 diabetes on insulin, hyperlipidemia, hypothyroidism, history of tracheobronchomalacia, coronary disease who presents to the ER today after having an outpatient CAT scan for further workup of elevated LFTs.  She was noted to have an incidental basilar pulmonary embolism.  Patient was seen in the family practice office about 2 weeks ago for dysuria.  Treated with p.o. Levaquin.  Urine cultures grew out E. coli that was sensitive to Levaquin.  She also had a cough at that time.  They told her that she may have had pneumonia.  Levaquin also treated.  She had labs drawn which showed elevated LFTs.  She repeated LFTs and they still were increasing.  She was scheduled for an outpatient CT scan.  This was performed as an outpatient today.  CT abdomen pelvis demonstrated bilateral lower lobe emboli.  CT scan also showed gallstones with a distended gallbladder and biliary sludge.  Right upper quadrant ultrasound demonstrated distended gallbladder with multiple stones wall was about 3.8 mm.  Negative sonographic Murphy sign.  There was no pericholecystic fluid seen.  Common bile duct was 5.9 mm.  Lower extremity ultrasounds were performed.  Negative for DVT bilaterally.  Room air saturations were 88% patient started on 3 L of oxygen.  [facility] hospitalist contacted for admission.

## 2022-04-18 NOTE — Assessment & Plan Note (Addendum)
Stable. Continue bipap. Hx of Tracheobronchomalacia

## 2022-04-18 NOTE — Assessment & Plan Note (Signed)
Stable. TSH 1.72 two weeks ago in office.

## 2022-04-18 NOTE — Progress Notes (Signed)
BLE venous duplex has been completed.  Preliminary results messaged to Dr. Tomi Bamberger.   Results can be found under chart review under CV PROC. 04/18/2022 7:14 PM Kirsta Probert RVT, RDMS

## 2022-04-18 NOTE — Assessment & Plan Note (Signed)
Stable. 

## 2022-04-18 NOTE — Assessment & Plan Note (Signed)
Stable. On crestor.

## 2022-04-18 NOTE — ED Triage Notes (Addendum)
Pt sent from imaging center for pulmonary embolism and possible infected gallbladder shown on CT scan done today. Recent dx of pneumonia - finished course of antibiotics. Endorses shortness of breath. Hypoxic in triage at 83% on room air.

## 2022-04-18 NOTE — ED Provider Notes (Signed)
[facility] Provider Note   CSN: [id number] Arrival date & time: 04/18/22  1647     History {Add pertinent medical, surgical, social history, OB history to HPI:1} Chief Complaint  Patient presents with   Abnormal CT    April [name] is a 82 y.o. female.  HPI   Pt was sent to the ED for evaluation of abnormal CT scan finding.  Daughter states her doctor ordered a CT scan of the abdomen because of recent abnormal laboratory tests.  Patient was called back and was told that it showed she had blood clots in the lungs.  Patient has been dealing with some respiratory symptoms.  She has been coughing.  Symptoms were felt to be related to a respiratory infection.  Patient states she has been feeling more short of breath than usual.  Family is also noted some swelling in her lower legs.  I thought it could be related to her not taking her diuretic meds.  Patient denies any abdominal pain.  No vomiting or diarrhea.  Home Medications Prior to Admission medications   Medication Sig Start Date End Date Taking? Authorizing Provider  albuterol (VENTOLIN HFA) 108 (90 Base) MCG/ACT inhaler Inhale 2 puffs into the lungs every 6 (six) hours as needed for wheezing or shortness of breath (Cough). 02/10/22   Lynden Oxford Scales, PA-C  Calcium Carbonate-Vitamin D (CALCIUM 600 + D PO) Take 1 tablet by mouth daily.    [provider]  escitalopram (LEXAPRO) 10 MG tablet Take 1 tablet (10 mg total) by mouth daily. 04/06/22   Susy Frizzle, MD  gabapentin (NEURONTIN) 600 MG tablet TAKE 1 TABLET BY MOUTH 4 TIMES DAILY 02/16/22   Susy Frizzle, MD  guaifenesin (HUMIBID E) 400 MG TABS tablet Take 1 tablet 3 times daily as needed for chest congestion and cough Patient not taking: Reported on 03/28/2022 02/10/22   Lynden Oxford Scales, PA-C  isosorbide mononitrate (IMDUR) 60 MG 24 hr tablet TAKE 1 TABLET BY MOUTH ONCE DAILY 05/23/21   [name], MD   LANTUS SOLOSTAR 100 UNIT/ML Solostar Pen INJECT 40-50 UNITS UNDER THE SKIN ONCE EVERY NIGHT AT BEDTIME. Patient not taking: Reported on 04/04/2022 01/23/22   Susy Frizzle, MD  metFORMIN (GLUCOPHAGE) 500 MG tablet TAKE 1 TABLET BY MOUTH TWICE (2) DAILY WITH A MEAL 09/02/21   Susy Frizzle, MD  metoprolol tartrate (LOPRESSOR) 25 MG tablet TAKE 1 TABLET BY MOUTH TWICE A DAY 07/13/21   [name], MD  Multiple Vitamin (MULTIVITAMIN) capsule Take 1 capsule by mouth daily.    [provider]  Omega-3 Fatty Acids (FISH OIL) 1000 MG CAPS Take 1 capsule by mouth daily.    [provider]  omeprazole (PRILOSEC) 20 MG capsule Take 20 mg by mouth daily.    [provider]  ondansetron (ZOFRAN-ODT) 4 MG disintegrating tablet Take 1 tablet (4 mg total) by mouth every 8 (eight) hours as needed for nausea or vomiting. Patient not taking: Reported on 03/28/2022 02/10/22   Lynden Oxford Scales, PA-C  ONETOUCH VERIO test strip USE AS DIRECTED TO MONITOR BLOOD SUGARS UP TO 3 TIMES DAILY AS NEEDED 10/27/21   Susy Frizzle, MD  potassium chloride (KLOR-CON) 10 MEQ tablet TAKE 1 TABLET BY MOUTH TWICE A DAY 03/22/22   Susy Frizzle, MD  promethazine-dextromethorphan (PROMETHAZINE-DM) 6.25-15 MG/5ML syrup Take 2.5 mLs by mouth at bedtime as needed for cough. Patient not taking: Reported on  04/04/2022 03/28/22   [name], FNP  rosuvastatin (CRESTOR) 40 MG tablet TAKE ONE TABLET BY MOUTH ONCE DAILY 05/23/21   [name], MD  torsemide (DEMADEX) 20 MG tablet TAKE 1 TABLET BY MOUTH ONCE DAILY 04/04/22   Susy Frizzle, MD      Allergies    Neomycin    Review of Systems   Review of Systems  Physical Exam Updated Vital Signs BP 125/62 (BP Location: Right Arm)   Pulse 96   Temp 98.9 F (37.2 C)   Resp 20   Ht 1.524 m (5')   Wt 54 kg   LMP  (LMP Unknown)   SpO2 98%   BMI 23.24 kg/m  Physical Exam Vitals and nursing note reviewed.  Constitutional:       General: She is not in acute distress.    Appearance: She is well-developed.  HENT:     Head: Normocephalic and atraumatic.     Right Ear: External ear normal.     Left Ear: External ear normal.  Eyes:     General: No scleral icterus.       Right eye: No discharge.        Left eye: No discharge.     Conjunctiva/sclera: Conjunctivae normal.  Neck:     Trachea: No tracheal deviation.  Cardiovascular:     Rate and Rhythm: Normal rate and regular rhythm.  Pulmonary:     Effort: Pulmonary effort is normal. No respiratory distress.     Breath sounds: Normal breath sounds. No stridor. No wheezing or rales.  Abdominal:     General: Bowel sounds are normal. There is no distension.     Palpations: Abdomen is soft.     Tenderness: There is no abdominal tenderness. There is no guarding or rebound.  Musculoskeletal:        General: No tenderness or deformity.     Cervical back: Neck supple.     Comments: Velcro splint right ankle, mild edema and erythema left calf  Skin:    General: Skin is warm and dry.     Findings: No rash.  Neurological:     General: No focal deficit present.     Mental Status: She is alert.     Cranial Nerves: No cranial nerve deficit, dysarthria or facial asymmetry.     Sensory: No sensory deficit.     Motor: No abnormal muscle tone or seizure activity.     Coordination: Coordination normal.  Psychiatric:        Mood and Affect: Mood normal.     ED Results / Procedures / Treatments   Labs (all labs ordered are listed, but only abnormal results are displayed) Labs Reviewed  COMPREHENSIVE METABOLIC PANEL  CBC WITH DIFFERENTIAL/PLATELET  TROPONIN I (HIGH SENSITIVITY)    EKG None  Radiology CT Abdomen Pelvis W Contrast  Result Date: 04/18/2022 CLINICAL DATA:  Upper abdominal and back pain. Fall 3 weeks ago. Elevated liver function tests EXAM: CT ABDOMEN AND PELVIS WITH CONTRAST TECHNIQUE: Multidetector CT imaging of the abdomen and pelvis was performed  using the standard protocol following bolus administration of intravenous contrast. RADIATION DOSE REDUCTION: This exam was performed according to the departmental dose-optimization program which includes automated exposure control, adjustment of the mA and/or kV according to patient size and/or use of iterative reconstruction technique. CONTRAST:  176m ISOVUE-300 IOPAMIDOL (ISOVUE-300) INJECTION 61% COMPARISON:  CT 05/12/2021 FINDINGS: Lower chest: There are reticular changes along the lung bases with some ground-glass  and more confluent opacity in the right lower lobe. Atelectasis versus infiltrate. All the changes are increased from the prior CT scan. No pleural effusion. Bilateral pulmonary emboli are noted at the edge of the imaging field. Moderate hiatal hernia. Hepatobiliary: No space-occupying liver lesion. Patent portal vein. The gallbladder is distended with sludge and stones. Please correlate with any symptoms. There is some pericholecystic fluid. Pancreas: Unremarkable. No pancreatic ductal dilatation or surrounding inflammatory changes. Spleen: Normal in size without focal abnormality. Adrenals/Urinary Tract: Adrenal glands are preserved. No enhancing renal mass or collecting system dilatation. There is heterogeneous enhancement of the kidneys bilaterally. No inflammatory stranding. This would have a differential including former nephritis, inflammatory or infectious. The ureters have normal course and caliber down to the bladder. Preserved contours of the urinary bladder. Stomach/Bowel: The large bowel has extensive colonic diverticulosis in particular along the sigmoid colon with some circular muscle hypertrophy. Normal appendix in the right lower quadrant. The large bowel on this non dilated. Small bowel is nondilated. Stomach is underdistended. Vascular/Lymphatic: Normal caliber aorta and IVC with some scattered vascular calcifications. No specific abnormal lymph node enlargement identified in the  abdomen and pelvis. There are a few small prominent upper abdominal nodes are stable towards the porta hepatis and portacaval region. Reproductive: Uterus and bilateral adnexa are unremarkable. Other: No ascites. Musculoskeletal: Curvature of the spine. Scattered degenerative changes. There is severe compression of the L1 vertebral body with sclerosis. Please correlate with the recent lumbar spine MRI of 04/01/2022. IMPRESSION: Incidental lower lung bilateral pulmonary emboli. Increasing lung base opacities.  Breathing motion. Dilated gallbladder with stones appears also adjacent fluid. Please correlate for signs of acute cholecystitis and if needed confirmatory study such as ultrasound and HIDA scan. Heterogeneous enhancement of both kidneys. There is no significant adjacent stranding. Differential includes form of nephritis. Infectious, inflammatory. Please correlate with clinical presentation in any recent treatment for UTI. Colonic diverticulosis without obstruction. Moderate hiatal hernia. Critical Value/emergent results were called by telephone at the time of interpretation on 04/18/2022 at 12:17 pm to provider [facility] , who verbally acknowledged these results. Electronically Signed   By: [name] M.D.   On: 04/18/2022 15:33    Procedures Procedures  {Document cardiac monitor, telemetry assessment procedure when appropriate:1}  Medications Ordered in ED Medications - No data to display  ED Course/ Medical Decision Making/ A&P   {   Click here for ABCD2, HEART and other calculatorsREFRESH Note before signing :1}                          Medical Decision Making Amount and/or Complexity of Data Reviewed Radiology: ordered.  Patient CT scan shows evidence of pulmonary embolism in the lower lung fields bilaterally.  CT scan also shows dilated gallbladder with stones and adjacent fluid.  Patient however denies any abdominal pain.  Outpatient LFTs however were very elevated recently.  She  may be having some issues with chronic cholecystitis.  Will add on an ultrasound. ***  {Document critical care time when appropriate:1} {Document review of labs and clinical decision tools ie heart score, Chads2Vasc2 etc:1}  {Document your independent review of radiology images, and any outside records:1} {Document your discussion with family members, caretakers, and with consultants:1} {Document social determinants of health affecting pt's care:1} {Document your decision making why or why not admission, treatments were needed:1} Final Clinical Impression(s) / ED Diagnoses Final diagnoses:  None    Rx / DC Orders ED Discharge  Orders     None

## 2022-04-18 NOTE — Assessment & Plan Note (Signed)
Continue with lantus. Hold metformin. Add SSI due to addition of systemic steroids.

## 2022-04-19 ENCOUNTER — Other Ambulatory Visit (HOSPITAL_COMMUNITY): Payer: Self-pay

## 2022-04-19 ENCOUNTER — Encounter (HOSPITAL_COMMUNITY): Payer: Self-pay | Admitting: Internal Medicine

## 2022-04-19 DIAGNOSIS — Z803 Family history of malignant neoplasm of breast: Secondary | ICD-10-CM | POA: Diagnosis not present

## 2022-04-19 DIAGNOSIS — I2510 Atherosclerotic heart disease of native coronary artery without angina pectoris: Secondary | ICD-10-CM | POA: Diagnosis present

## 2022-04-19 DIAGNOSIS — J9811 Atelectasis: Secondary | ICD-10-CM | POA: Diagnosis present

## 2022-04-19 DIAGNOSIS — Z8249 Family history of ischemic heart disease and other diseases of the circulatory system: Secondary | ICD-10-CM | POA: Diagnosis not present

## 2022-04-19 DIAGNOSIS — I2699 Other pulmonary embolism without acute cor pulmonale: Secondary | ICD-10-CM | POA: Diagnosis present

## 2022-04-19 DIAGNOSIS — Z8701 Personal history of pneumonia (recurrent): Secondary | ICD-10-CM | POA: Diagnosis not present

## 2022-04-19 DIAGNOSIS — Z87440 Personal history of urinary (tract) infections: Secondary | ICD-10-CM | POA: Diagnosis not present

## 2022-04-19 DIAGNOSIS — J9601 Acute respiratory failure with hypoxia: Secondary | ICD-10-CM | POA: Diagnosis present

## 2022-04-19 DIAGNOSIS — E1169 Type 2 diabetes mellitus with other specified complication: Secondary | ICD-10-CM | POA: Diagnosis present

## 2022-04-19 DIAGNOSIS — K219 Gastro-esophageal reflux disease without esophagitis: Secondary | ICD-10-CM | POA: Diagnosis present

## 2022-04-19 DIAGNOSIS — G4733 Obstructive sleep apnea (adult) (pediatric): Secondary | ICD-10-CM | POA: Diagnosis present

## 2022-04-19 DIAGNOSIS — Z833 Family history of diabetes mellitus: Secondary | ICD-10-CM | POA: Diagnosis not present

## 2022-04-19 DIAGNOSIS — E785 Hyperlipidemia, unspecified: Secondary | ICD-10-CM | POA: Diagnosis present

## 2022-04-19 DIAGNOSIS — Z825 Family history of asthma and other chronic lower respiratory diseases: Secondary | ICD-10-CM | POA: Diagnosis not present

## 2022-04-19 DIAGNOSIS — Z794 Long term (current) use of insulin: Secondary | ICD-10-CM | POA: Diagnosis not present

## 2022-04-19 DIAGNOSIS — Z832 Family history of diseases of the blood and blood-forming organs and certain disorders involving the immune mechanism: Secondary | ICD-10-CM | POA: Diagnosis not present

## 2022-04-19 DIAGNOSIS — S32019S Unspecified fracture of first lumbar vertebra, sequela: Secondary | ICD-10-CM | POA: Diagnosis not present

## 2022-04-19 DIAGNOSIS — J209 Acute bronchitis, unspecified: Secondary | ICD-10-CM | POA: Diagnosis present

## 2022-04-19 DIAGNOSIS — E039 Hypothyroidism, unspecified: Secondary | ICD-10-CM | POA: Diagnosis present

## 2022-04-19 DIAGNOSIS — I10 Essential (primary) hypertension: Secondary | ICD-10-CM | POA: Diagnosis present

## 2022-04-19 DIAGNOSIS — Z87891 Personal history of nicotine dependence: Secondary | ICD-10-CM | POA: Diagnosis not present

## 2022-04-19 DIAGNOSIS — J440 Chronic obstructive pulmonary disease with acute lower respiratory infection: Secondary | ICD-10-CM | POA: Diagnosis present

## 2022-04-19 DIAGNOSIS — K828 Other specified diseases of gallbladder: Secondary | ICD-10-CM | POA: Diagnosis present

## 2022-04-19 DIAGNOSIS — I2609 Other pulmonary embolism with acute cor pulmonale: Secondary | ICD-10-CM | POA: Diagnosis not present

## 2022-04-19 DIAGNOSIS — K8020 Calculus of gallbladder without cholecystitis without obstruction: Secondary | ICD-10-CM | POA: Diagnosis present

## 2022-04-19 LAB — CBC WITH DIFFERENTIAL/PLATELET
Abs Immature Granulocytes: 0.02 10*3/uL (ref 0.00–0.07)
Basophils Absolute: 0 10*3/uL (ref 0.0–0.1)
Basophils Relative: 0 %
Eosinophils Absolute: 0 10*3/uL (ref 0.0–0.5)
Eosinophils Relative: 0 %
HCT: 43.1 % (ref 36.0–46.0)
Hemoglobin: 13.6 g/dL (ref 12.0–15.0)
Immature Granulocytes: 0 %
Lymphocytes Relative: 8 %
Lymphs Abs: 0.5 10*3/uL — ABNORMAL LOW (ref 0.7–4.0)
MCH: 29.3 pg (ref 26.0–34.0)
MCHC: 31.6 g/dL (ref 30.0–36.0)
MCV: 92.9 fL (ref 80.0–100.0)
Monocytes Absolute: 0.2 10*3/uL (ref 0.1–1.0)
Monocytes Relative: 2 %
Neutro Abs: 5.9 10*3/uL (ref 1.7–7.7)
Neutrophils Relative %: 90 %
Platelets: 297 10*3/uL (ref 150–400)
RBC: 4.64 MIL/uL (ref 3.87–5.11)
RDW: 18.5 % — ABNORMAL HIGH (ref 11.5–15.5)
WBC: 6.7 10*3/uL (ref 4.0–10.5)
nRBC: 0 % (ref 0.0–0.2)

## 2022-04-19 LAB — HEPARIN LEVEL (UNFRACTIONATED)
Heparin Unfractionated: 0.44 IU/mL (ref 0.30–0.70)
Heparin Unfractionated: 0.35 IU/mL (ref 0.30–0.70)
Heparin Unfractionated: 0.46 IU/mL (ref 0.30–0.70)

## 2022-04-19 LAB — GLUCOSE, CAPILLARY
Glucose-Capillary: 142 mg/dL — ABNORMAL HIGH (ref 70–99)
Glucose-Capillary: 216 mg/dL — ABNORMAL HIGH (ref 70–99)
Glucose-Capillary: 231 mg/dL — ABNORMAL HIGH (ref 70–99)
Glucose-Capillary: 120 mg/dL — ABNORMAL HIGH (ref 70–99)
Glucose-Capillary: 187 mg/dL — ABNORMAL HIGH (ref 70–99)

## 2022-04-19 LAB — PROCALCITONIN: Procalcitonin: 0.43 ng/mL

## 2022-04-19 MED ORDER — ALUM & MAG HYDROXIDE-SIMETH 200-200-20 MG/5ML PO SUSP
30.0000 mL | ORAL | Status: DC | PRN
  Administered 2022-04-19 – 2022-04-20 (×2): 30 mL via ORAL
  Filled 2022-04-19 (×2): qty 30

## 2022-04-19 MED ORDER — ORAL CARE MOUTH RINSE: 15.0000 mL | OROMUCOSAL | Status: DC | PRN

## 2022-04-19 MED ORDER — ONDANSETRON HCL 4 MG/2ML IJ SOLN
4.0000 mg | Freq: Four times a day (QID) | INTRAMUSCULAR | Status: DC | PRN
  Administered 2022-04-19: 4 mg via INTRAVENOUS
  Filled 2022-04-19: qty 2

## 2022-04-19 MED ORDER — ISOSORBIDE MONONITRATE ER 60 MG PO TB24
60.0000 mg | ORAL_TABLET | Freq: Every day | ORAL | Status: DC
  Administered 2022-04-19 – 2022-04-20 (×2): 60 mg via ORAL
  Filled 2022-04-19 (×2): qty 1

## 2022-04-19 MED ORDER — ESCITALOPRAM OXALATE 10 MG PO TABS
10.0000 mg | ORAL_TABLET | Freq: Every day | ORAL | Status: DC
  Administered 2022-04-19 – 2022-04-20 (×2): 10 mg via ORAL
  Filled 2022-04-19 (×2): qty 1

## 2022-04-19 MED ORDER — GUAIFENESIN-DM 100-10 MG/5ML PO SYRP
15.0000 mL | ORAL_SOLUTION | ORAL | Status: DC | PRN
  Administered 2022-04-19 (×2): 15 mL via ORAL
  Filled 2022-04-19 (×2): qty 15

## 2022-04-19 MED ORDER — IPRATROPIUM-ALBUTEROL 0.5-2.5 (3) MG/3ML IN SOLN: 3.0000 mL | RESPIRATORY_TRACT | Status: DC | PRN

## 2022-04-19 MED ORDER — ONDANSETRON HCL 4 MG PO TABS: 4.0000 mg | ORAL_TABLET | Freq: Four times a day (QID) | ORAL | Status: DC | PRN

## 2022-04-19 MED ORDER — PANTOPRAZOLE SODIUM 40 MG PO TBEC
40.0000 mg | DELAYED_RELEASE_TABLET | Freq: Every day | ORAL | Status: DC
  Administered 2022-04-19 – 2022-04-20 (×2): 40 mg via ORAL
  Filled 2022-04-19 (×2): qty 1

## 2022-04-19 MED ORDER — INSULIN ASPART 100 UNIT/ML IJ SOLN: 0.0000 [IU] | Freq: Every day | INTRAMUSCULAR | Status: DC

## 2022-04-19 MED ORDER — INSULIN ASPART 100 UNIT/ML IJ SOLN
0.0000 [IU] | Freq: Three times a day (TID) | INTRAMUSCULAR | Status: DC
  Administered 2022-04-20: 2 [IU] via SUBCUTANEOUS
  Administered 2022-04-20: 11 [IU] via SUBCUTANEOUS
  Administered 2022-04-20: 2 [IU] via SUBCUTANEOUS

## 2022-04-19 MED ORDER — FAMOTIDINE IN NACL 20-0.9 MG/50ML-% IV SOLN
20.0000 mg | Freq: Once | INTRAVENOUS | Status: AC
  Administered 2022-04-19: 20 mg via INTRAVENOUS
  Filled 2022-04-19: qty 50

## 2022-04-19 MED ORDER — INSULIN ASPART 100 UNIT/ML IJ SOLN
0.0000 [IU] | Freq: Three times a day (TID) | INTRAMUSCULAR | Status: DC
  Administered 2022-04-19 (×2): 3 [IU] via SUBCUTANEOUS

## 2022-04-19 MED ORDER — INSULIN GLARGINE-YFGN 100 UNIT/ML ~~LOC~~ SOLN
10.0000 [IU] | Freq: Every day | SUBCUTANEOUS | Status: DC
  Administered 2022-04-19: 10 [IU] via SUBCUTANEOUS
  Filled 2022-04-19 (×2): qty 0.1

## 2022-04-19 MED ORDER — ACETAMINOPHEN 325 MG PO TABS
650.0000 mg | ORAL_TABLET | Freq: Four times a day (QID) | ORAL | Status: DC | PRN
  Administered 2022-04-19 – 2022-04-20 (×4): 650 mg via ORAL
  Filled 2022-04-19 (×4): qty 2

## 2022-04-19 MED ORDER — ACETAMINOPHEN 650 MG RE SUPP: 650.0000 mg | Freq: Four times a day (QID) | RECTAL | Status: DC | PRN

## 2022-04-19 MED ORDER — ROSUVASTATIN CALCIUM 20 MG PO TABS
40.0000 mg | ORAL_TABLET | Freq: Every day | ORAL | Status: DC
  Administered 2022-04-19 – 2022-04-20 (×2): 40 mg via ORAL
  Filled 2022-04-19 (×2): qty 2

## 2022-04-19 MED ORDER — INSULIN GLARGINE-YFGN 100 UNIT/ML ~~LOC~~ SOLN
5.0000 [IU] | Freq: Every day | SUBCUTANEOUS | Status: DC
  Filled 2022-04-19: qty 0.05

## 2022-04-19 MED ORDER — METOPROLOL TARTRATE 25 MG PO TABS
25.0000 mg | ORAL_TABLET | Freq: Two times a day (BID) | ORAL | Status: DC
  Administered 2022-04-19 – 2022-04-20 (×2): 25 mg via ORAL
  Filled 2022-04-19 (×2): qty 1

## 2022-04-19 NOTE — Progress Notes (Signed)
ANTICOAGULATION CONSULT NOTE - [facility] for heparin infusion Indication: pulmonary embolus  Allergies  Allergen Reactions   Neomycin Other (See Comments)    Visual disturbance and swelling in eyes   Patient Measurements: Height: 5' (152.4 cm) Weight: 53.8 kg (118 lb 9.7 oz) IBW/kg (Calculated) : 45.5 kg Heparin Dosing Weight: 54 kg  Vital Signs: Temp: 97.7 F (36.5 C) (03/06 1932) Temp Source: Oral (03/06 1932) BP: 111/59 (03/06 1932) Pulse Rate: 103 (03/06 1932)  Labs: Recent Labs    04/18/22 1829 04/18/22 2130 04/19/22 0343 04/19/22 1007 04/19/22 1915  HGB  --  13.1 13.6  --   --   HCT  --  40.3 43.1  --   --   PLT  --  221 297  --   --   HEPARINUNFRC  --   --  0.44 0.35 0.46  CREATININE 0.51  --   --   --   --   TROPONINIHS 7  --   --   --   --      Estimated Creatinine Clearance: 40.3 mL/min (by C-G formula based on SCr of 0.51 mg/dL).  Medical History: Past Medical History:  Diagnosis Date   Abnormal weight gain    Acute diverticulitis 05/13/2021   Arthritis    "joints" (06/13/2013)   COPD (chronic obstructive pulmonary disease) (HCC)    Coronary artery disease, non-occlusive 06/13/2013   Cardiac Cath: sharp Angle take-off of Large Dominant Cx: ~70-80% mid Cx bifurcation lesion (Lateral OM &  [id number] both with hairpin ostial & ~50% lesions) - Not PCI amenable due to vessel tortuosity; ~40-50% mid LAD;Ramus - no significnat diseaes; small non-dominant RCA   Diverticulosis    GERD (gastroesophageal reflux disease)    History of stress test    a. 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   Assessment: [name] admitted for acute PE. No anticoagulation prior to admission.  Pharmacy consulted to dose and manage heparin infusion for PE.  Heparin level therapeutic (0.46) on infusion at 950 units/hr. No bleeding noted.  Goal of Therapy:  Heparin level 0.3-0.7 units/ml Monitor platelets by anticoagulation protocol: Yes   Plan:  Continue heparin infusion rate at 950 units/hr Monitor daily CBC, heparin level, and for s/sx of bleeding F/u plan for transition to oral anticoagulation  Sherlon Handing, PharmD, BCPS Please see amion for complete clinical pharmacist phone list 04/19/2022 7:52 PM

## 2022-04-19 NOTE — Progress Notes (Signed)
Pt is transferred from ED to 4E26. She is alert and fully oriented x 4, no chills, no fever. Lower back pain from previous fall 3 week ago is well tolerated. Pt 's daughter in law stays at bedside overnight.    Hemodynamically stable, HR 98-108, ST on the monitor, BP 149/65 mmHg. RR 22-26, Spo2 92-95% on 2 LPM of NCL.  Lungs sound having crackles on right and left upper lobes, and diminished at bilateral lung bases on auscultations. She also has productive cough, denies pleuritic chest pain. Duoneb breathing treatment given at arrival. Heparin gtt at 900 units/hr. We will continue to monitor.  Kennyth Lose, RN

## 2022-04-19 NOTE — Progress Notes (Signed)
PROGRESS NOTE   April [name]  [id number]    DOB: 12/03/1940    DOA: 04/18/2022  PCP: Susy Frizzle, MD   I have briefly reviewed patients previous medical records in [facility].  Chief Complaint  Patient presents with   Abnormal CT    Brief Narrative:  82 year old female, lives by herself, medical history significant for hypertension, OSA on nightly BiPAP, type II DM on insulin, hyperlipidemia, hypothyroidism, tracheobronchomalacia, CAD, recent L1 and right lower extremity fracture with relative immobility, recent E. coli UTI treated with sensitive levofloxacin, evaluated by PCP with CT abdomen for elevated LFTs and noted to have incidental basilar pulmonary embolism.  Advised to present to the ED for further evaluation.  Room air oxygen saturations were 88% and patient was started on 3 L/min oxygen.   Assessment & Plan:  Principal Problem:   Acute pulmonary embolism (HCC) Active Problems:   Acute respiratory failure with hypoxia (HCC)   Wheezing   OSA treated with BiPAP   Hyperlipidemia associated with type 2 diabetes mellitus ([place])   Essential hypertension   Type 2 diabetes mellitus treated with insulin ([place])   Hypothyroid   Cholelithiasis   Acute pulmonary embolism: Suspected due to relative immobility from L1 fracture and right lower extremity fracture.  Mild intermittent tachypnea, mild tachycardia but not hypotensive.  Hypoxic on 2 L/min [place] oxygen.  Lower extremity venous Dopplers negative for DVT.  Started on IV heparin.  There was no dedicated CTA chest done.  Will follow 2D echo results and if no evidence of right heart strain then transition from IV heparin to Eliquis.  Discussed risks and benefits of anticoagulation, individual options available including warfarin, Xarelto and Eliquis risks and benefits of each and side effects and patient and family opted for Eliquis.  Acute respiratory failure with hypoxia: Suspect multifactorial due to acute pulmonary  embolism, underlying OSA and atelectasis.  Incentive spirometry.  Titrate/wean off oxygen as tolerated.  Will need to evaluate home O2 needs prior to discharge.  Acute bronchitis/recent pneumonia treatment: On admission, reportedly had wheezing.  Also had hypoxia.  Recently treated for UTI/pneumonia 2 weeks prior with 5 to 7 days of levofloxacin.  Currently on Omnicef and oral prednisone, continue for now but consider stopping Omnicef.  Will check procalcitonin.  Cholelithiasis: Seen on CT and RUQ ultrasound.  No abdominal pain or other GI symptoms.  Did have elevated LFTs about 2 weeks ago.  As per daughter-in-law, patient was taking 1500 mg of Tylenol every 4 hours at home for several days as per H&P.  LFTs have normalized.  Family have been counseled regarding maximum dose of Tylenol to be 4 g/day.  Hypothyroid Stable. TSH 1.72 two weeks ago in office.   Type 2 diabetes mellitus treated with insulin ([place]) Continue with lantus. Hold metformin. Add SSI due to addition of systemic steroids.   Essential hypertension Stable.    Hyperlipidemia associated with type 2 diabetes mellitus (HCC) Stable. On crestor.   OSA treated with BiPAP Stable. Continue bipap. Hx of Tracheobronchomalacia   Reported L1 and RLE fracture/distal fibular: As per family's report, was not felt to be a surgical candidate.  Supportive care.  PT evaluation.    Body mass index is 23.16 kg/m.   DVT prophylaxis: SCDs Start: 04/19/22 0122     Code Status: Full Code:  ACP Documents: None present Family Communication: Son and daughter-in-law at bedside Disposition:  Status is: Observation The patient will require care spanning > 2 midnights and  should be moved to inpatient because: Ongoing O2 needs, intermittent tachypnea, mild tachycardia, remains on IV heparin pending echo results.     Consultants:     Procedures:     Antimicrobials:   Omnicef   Subjective:  Intermittent dry cough.  Had dyspnea early  on in admission but none reported this morning.  No abdominal pain, nausea or vomiting.  Objective:   Vitals:   04/19/22 0300 04/19/22 0344 04/19/22 0345 04/19/22 0814  BP:  (!) 126/57 (!) 126/57 (!) 127/44  Pulse: 98 100 (!) 106 (!) 110  Resp: (!) '22 17 20 15  '$ Temp:  98 F (36.7 C)  (!) 97.5 F (36.4 C)  TempSrc:  Oral  Oral  SpO2: 92% 94% 92% 95%  Weight:   53.8 kg   Height:        General exam: Elderly female, moderately built and thinly nourished sitting up comfortably in bed without distress.  Intermittent dry cough. Respiratory system: Clear to auscultation. Respiratory effort normal. Cardiovascular system: S1 & S2 heard, RRR. No JVD, murmurs, rubs, gallops or clicks. No pedal edema.  Telemetry personally reviewed: Sinus tachycardia in the 110s. Gastrointestinal system: Abdomen is nondistended, soft and nontender. No organomegaly or masses felt. Normal bowel sounds heard. Central nervous system: Alert and oriented. No focal neurological deficits. Extremities: Symmetric 5 x 5 power. Skin: No rashes, lesions or ulcers Psychiatry: Judgement and insight appear normal. Mood & affect appropriate.     Data Reviewed:   I have personally reviewed following labs and imaging studies   CBC: Recent Labs  Lab 04/18/22 2130 04/19/22 0343  WBC 7.6 6.7  NEUTROABS 5.1 5.9  HGB 13.1 13.6  HCT 40.3 43.1  MCV 92.2 92.9  PLT 221 [id number]    Basic Metabolic Panel: Recent Labs  Lab 04/18/22 1829  NA 136  K 3.7  CL 91*  CO2 31  GLUCOSE 199*  BUN 10  CREATININE 0.51  CALCIUM 10.6*    Liver Function Tests: Recent Labs  Lab 04/18/22 1829  AST 35  ALT 21  ALKPHOS 98  BILITOT 0.6  PROT 6.9  ALBUMIN 2.5*    CBG: Recent Labs  Lab 04/19/22 0107 04/19/22 0601 04/19/22 1123  GLUCAP 142* 216* 231*    Microbiology Studies:  No results found for this or any previous visit (from the past 240 hour(s)).  Radiology Studies:  VAS [place] LOWER EXTREMITY VENOUS (DVT)  (7a-7p)  Result Date: 04/18/2022  Lower Venous DVT Study Patient Name:  April [name]  Date of Exam:   04/18/2022 Medical Rec #: [id number]       Accession #:    [id number] Date of Birth: March 21, 1940       Patient Gender: F Patient Age:   41 years Exam Location[facility]  [facility] Procedure:      VAS [place] LOWER EXTREMITY VENOUS (DVT) Referring Phys: Wille Glaser KNAPP --------------------------------------------------------------------------------  Indications: Pulmonary embolism.  Limitations: Poor ultrasound/tissue interface. Comparison Study: No previous exams Performing Technologist: Jody Hill RVT, RDMS  Examination Guidelines: A complete evaluation includes B-mode imaging, spectral Doppler, color Doppler, and power Doppler as needed of all accessible portions of each vessel. Bilateral testing is considered an integral part of a complete examination. Limited examinations for reoccurring indications may be performed as noted. The reflux portion of the exam is performed with the patient in reverse Trendelenburg.  +---------+---------------+---------+-----------+----------+--------------+ RIGHT    CompressibilityPhasicitySpontaneityPropertiesThrombus Aging +---------+---------------+---------+-----------+----------+--------------+ CFV      Full  Yes      Yes                                 +---------+---------------+---------+-----------+----------+--------------+ SFJ      Full                                                        +---------+---------------+---------+-----------+----------+--------------+ FV Prox  Full           Yes      Yes                                 +---------+---------------+---------+-----------+----------+--------------+ FV Mid   Full           Yes      Yes                                 +---------+---------------+---------+-----------+----------+--------------+ FV DistalFull           Yes      Yes                                  +---------+---------------+---------+-----------+----------+--------------+ PFV      Full                                                        +---------+---------------+---------+-----------+----------+--------------+ POP      Full           Yes      Yes                                 +---------+---------------+---------+-----------+----------+--------------+ PTV      Full                                                        +---------+---------------+---------+-----------+----------+--------------+ PERO     Full                                                        +---------+---------------+---------+-----------+----------+--------------+   +---------+---------------+---------+-----------+----------+--------------+ LEFT     CompressibilityPhasicitySpontaneityPropertiesThrombus Aging +---------+---------------+---------+-----------+----------+--------------+ CFV      Full           Yes      Yes                                 +---------+---------------+---------+-----------+----------+--------------+ SFJ      Full                                                        +---------+---------------+---------+-----------+----------+--------------+  FV Prox  Full           Yes      Yes                                 +---------+---------------+---------+-----------+----------+--------------+ FV Mid   Full           Yes      Yes                                 +---------+---------------+---------+-----------+----------+--------------+ FV DistalFull           Yes      Yes                                 +---------+---------------+---------+-----------+----------+--------------+ PFV      Full                                                        +---------+---------------+---------+-----------+----------+--------------+ POP      Full           Yes      Yes                                  +---------+---------------+---------+-----------+----------+--------------+ PTV      Full                                                        +---------+---------------+---------+-----------+----------+--------------+ PERO     Full                                                        +---------+---------------+---------+-----------+----------+--------------+     Summary: BILATERAL: - No evidence of deep vein thrombosis seen in the lower extremities, bilaterally. -No evidence of popliteal cyst, bilaterally.   *See table(s) above for measurements and observations. Electronically signed by Harold Barban MD on 04/18/2022 at 10:09:01 PM.    Final    DG Chest Portable 1 View  Result Date: 04/18/2022 CLINICAL DATA:  Cough and known pulmonary embolism EXAM: PORTABLE CHEST 1 VIEW COMPARISON:  04/06/2018 FINDINGS: Cardiac shadow is stable. Aortic calcifications are noted. Lungs are hypoinflated with mild right basilar atelectasis. No bony abnormality is noted. IMPRESSION: Mild right basilar atelectasis. Electronically Signed   By: Inez Catalina M.D.   On: 04/18/2022 19:34   US Abdomen Limited  Result Date: 04/18/2022 CLINICAL DATA:  History of cholelithiasis EXAM: ULTRASOUND ABDOMEN LIMITED RIGHT UPPER QUADRANT COMPARISON:  CT from earlier in the same day. FINDINGS: Gallbladder: Gallbladder is well distended with multiple gallstones within. The wall is mildly thickened at 3.8 mm. Negative sonographic Murphy's sign is noted however. Gallbladder sludge is seen. No pericholecystic fluid is seen. Common bile duct: Diameter: 5.9 mm. Liver: No focal lesion  identified. Within normal limits in parenchymal echogenicity. Portal vein is patent on color Doppler imaging with normal direction of blood flow towards the liver. Other: None. IMPRESSION: Cholelithiasis and gallbladder sludge without complicating factors. Electronically Signed   By: Inez Catalina M.D.   On: 04/18/2022 19:31   CT Abdomen Pelvis W  Contrast  Result Date: 04/18/2022 CLINICAL DATA:  Upper abdominal and back pain. Fall 3 weeks ago. Elevated liver function tests EXAM: CT ABDOMEN AND PELVIS WITH CONTRAST TECHNIQUE: Multidetector CT imaging of the abdomen and pelvis was performed using the standard protocol following bolus administration of intravenous contrast. RADIATION DOSE REDUCTION: This exam was performed according to the departmental dose-optimization program which includes automated exposure control, adjustment of the mA and/or kV according to patient size and/or use of iterative reconstruction technique. CONTRAST:  17m ISOVUE-300 IOPAMIDOL (ISOVUE-300) INJECTION 61% COMPARISON:  CT 05/12/2021 FINDINGS: Lower chest: There are reticular changes along the lung bases with some ground-glass and more confluent opacity in the right lower lobe. Atelectasis versus infiltrate. All the changes are increased from the prior CT scan. No pleural effusion. Bilateral pulmonary emboli are noted at the edge of the imaging field. Moderate hiatal hernia. Hepatobiliary: No space-occupying liver lesion. Patent portal vein. The gallbladder is distended with sludge and stones. Please correlate with any symptoms. There is some pericholecystic fluid. Pancreas: Unremarkable. No pancreatic ductal dilatation or surrounding inflammatory changes. Spleen: Normal in size without focal abnormality. Adrenals/Urinary Tract: Adrenal glands are preserved. No enhancing renal mass or collecting system dilatation. There is heterogeneous enhancement of the kidneys bilaterally. No inflammatory stranding. This would have a differential including former nephritis, inflammatory or infectious. The ureters have normal course and caliber down to the bladder. Preserved contours of the urinary bladder. Stomach/Bowel: The large bowel has extensive colonic diverticulosis in particular along the sigmoid colon with some circular muscle hypertrophy. Normal appendix in the right lower quadrant.  The large bowel on this non dilated. Small bowel is nondilated. Stomach is underdistended. Vascular/Lymphatic: Normal caliber aorta and IVC with some scattered vascular calcifications. No specific abnormal lymph node enlargement identified in the abdomen and pelvis. There are a few small prominent upper abdominal nodes are stable towards the porta hepatis and portacaval region. Reproductive: Uterus and bilateral adnexa are unremarkable. Other: No ascites. Musculoskeletal: Curvature of the spine. Scattered degenerative changes. There is severe compression of the L1 vertebral body with sclerosis. Please correlate with the recent lumbar spine MRI of 04/01/2022. IMPRESSION: Incidental lower lung bilateral pulmonary emboli. Increasing lung base opacities.  Breathing motion. Dilated gallbladder with stones appears also adjacent fluid. Please correlate for signs of acute cholecystitis and if needed confirmatory study such as ultrasound and HIDA scan. Heterogeneous enhancement of both kidneys. There is no significant adjacent stranding. Differential includes form of nephritis. Infectious, inflammatory. Please correlate with clinical presentation in any recent treatment for UTI. Colonic diverticulosis without obstruction. Moderate hiatal hernia. Critical Value/emergent results were called by telephone at the time of interpretation on 04/18/2022 at 12:17 pm to [facility], who verbally acknowledged these results. Electronically Signed   By: AJill SideM.D.   On: 04/18/2022 15:33    Scheduled Meds:    cefdinir  300 mg Oral Q12H   escitalopram  10 mg Oral Daily   insulin aspart  0-5 Units Subcutaneous QHS   insulin aspart  0-9 Units Subcutaneous TID WC   insulin glargine-yfgn  5 Units Subcutaneous QHS   isosorbide mononitrate  60 mg Oral Daily   predniSONE  40 mg Oral Q breakfast   rosuvastatin  40 mg Oral Daily    Continuous Infusions:    heparin 950 Units/hr (04/19/22 1216)     LOS: 0 days      Vernell Leep, MD,  FACP, [facility], [facility], [facility], [place]     To contact the attending provider between 7A-7P or the covering provider during after hours 7P-7A, please log into the web site [email or web address] and access using universal [place] password for that web site. If you do not have the password, please call the hospital operator.  04/19/2022, 4:54 PM

## 2022-04-19 NOTE — TOC Benefit Eligibility Note (Signed)
Patient Teacher, English as a foreign language completed.    The patient is currently admitted and upon discharge could be taking Eliquis 5 mg.  The current 30 day co-pay is $45.00.   The patient is insured through [place], [place] Patient Advocate Specialist [place] Patient Advocate Team Direct Number: [phone]  Fax: [phone]

## 2022-04-19 NOTE — Progress Notes (Signed)
ANTICOAGULATION CONSULT NOTE - Follow Up Consult  Pharmacy Consult for heparin Indication: pulmonary embolus  Labs: Recent Labs    04/18/22 1829 04/18/22 2130 04/19/22 0343  HGB  --  13.1 13.6  HCT  --  40.3 43.1  PLT  --  221 297  HEPARINUNFRC  --   --  0.44  CREATININE 0.51  --   --   TROPONINIHS 7  --   --     Assessment/Plan:  82yo female therapeutic on heparin with initial dosing for PE. Will continue infusion at current rate of 900 units/hr and confirm stable with additional level.   Wynona Neat, PharmD, BCPS  04/19/2022,4:08 AM

## 2022-04-19 NOTE — Progress Notes (Signed)
ANTICOAGULATION CONSULT NOTE - [place] for heparin infusion Indication: pulmonary embolus  Allergies  Allergen Reactions   Neomycin Other (See Comments)    Visual disturbance and swelling in eyes   Patient Measurements: Height: 5' (152.4 cm) Weight: 53.8 kg (118 lb 9.7 oz) IBW/kg (Calculated) : 45.5 kg Heparin Dosing Weight: 54 kg  Vital Signs: Temp: 97.5 F (36.4 C) (03/06 0814) Temp Source: Oral (03/06 0814) BP: 127/44 (03/06 0814) Pulse Rate: 110 (03/06 0814)  Labs: Recent Labs    04/18/22 1829 04/18/22 2130 04/19/22 0343 04/19/22 1007  HGB  --  13.1 13.6  --   HCT  --  40.3 43.1  --   PLT  --  221 297  --   HEPARINUNFRC  --   --  0.44 0.35  CREATININE 0.51  --   --   --   TROPONINIHS 7  --   --   --     Estimated Creatinine Clearance: 40.3 mL/min (by C-G formula based on SCr of 0.51 mg/dL).  Medical History: Past Medical History:  Diagnosis Date   Abnormal weight gain    Acute diverticulitis 05/13/2021   Arthritis    "joints" (06/13/2013)   COPD (chronic obstructive pulmonary disease) (HCC)    Coronary artery disease, non-occlusive 06/13/2013   Cardiac Cath: sharp Angle take-off of Large Dominant Cx: ~70-80% mid Cx bifurcation lesion (Lateral OM &  [id number] both with hairpin ostial & ~50% lesions) - Not PCI amenable due to vessel tortuosity; ~40-50% mid LAD;[place] - no significnat diseaes; small non-dominant RCA   Diverticulosis    GERD (gastroesophageal reflux disease)    History of stress test    a. 09/2006 nl Dobutamine Echo   Hyperlipidemia    Hypertension    NIDDM (non-insulin dependent diabetes mellitus)    On home oxygen therapy    "2L q hs; runs into my BIPAP" (06/13/2013)   OSA (obstructive sleep apnea)    "BIPAP w/O2" (06/13/2013)   Tracheobronchomalacia    a. followed by [facility]   Assessment: [name] admitted for acute PE. No anticoagulation prior to admission. Pharmacy consulted to dose and manage heparin  infusion for PE.  Heparin level 0.35 - therapeutic (trending down to lower end of therapeutic range) Current heparin infusion rate: 900 units/hr  Hgb 13.6, Plt 297 - stable No s/sx of bleeding per RN report.  Goal of Therapy:  Heparin level 0.3-0.7 units/ml Monitor platelets by anticoagulation protocol: Yes   Plan:  Increase heparin infusion rate to 950 units/hr Check heparin level in 8 hours Monitor daily CBC, heparin level, and for s/sx of bleeding F/u plan for transition to oral anticoagulation  Luisa Hart, PharmD, BCPS Clinical Pharmacist 04/19/2022 11:44 AM   Please refer to AMION for pharmacy phone number

## 2022-04-20 ENCOUNTER — Inpatient Hospital Stay (HOSPITAL_COMMUNITY): Payer: Medicare Other

## 2022-04-20 ENCOUNTER — Other Ambulatory Visit (HOSPITAL_COMMUNITY): Payer: Self-pay

## 2022-04-20 DIAGNOSIS — I2609 Other pulmonary embolism with acute cor pulmonale: Secondary | ICD-10-CM | POA: Diagnosis not present

## 2022-04-20 DIAGNOSIS — I350 Nonrheumatic aortic (valve) stenosis: Secondary | ICD-10-CM | POA: Insufficient documentation

## 2022-04-20 LAB — HEPARIN LEVEL (UNFRACTIONATED): Heparin Unfractionated: 0.89 IU/mL — ABNORMAL HIGH (ref 0.30–0.70)

## 2022-04-20 LAB — GLUCOSE, CAPILLARY
Glucose-Capillary: 138 mg/dL — ABNORMAL HIGH (ref 70–99)
Glucose-Capillary: 126 mg/dL — ABNORMAL HIGH (ref 70–99)
Glucose-Capillary: 320 mg/dL — ABNORMAL HIGH (ref 70–99)

## 2022-04-20 LAB — CBC
HCT: 39.9 % (ref 36.0–46.0)
Hemoglobin: 12.6 g/dL (ref 12.0–15.0)
MCH: 29.2 pg (ref 26.0–34.0)
MCHC: 31.6 g/dL (ref 30.0–36.0)
MCV: 92.6 fL (ref 80.0–100.0)
Platelets: 303 10*3/uL (ref 150–400)
RBC: 4.31 MIL/uL (ref 3.87–5.11)
RDW: 18.6 % — ABNORMAL HIGH (ref 11.5–15.5)
WBC: 6.5 10*3/uL (ref 4.0–10.5)
nRBC: 0 % (ref 0.0–0.2)

## 2022-04-20 LAB — ECHOCARDIOGRAM COMPLETE
AR max vel: 1.54 cm2
AV Area VTI: 1.58 cm2
AV Area mean vel: 1.45 cm2
AV Mean grad: 9.6 mmHg
AV Peak grad: 17.2 mmHg
Ao pk vel: 2.07 m/s
Area-P 1/2: 3.48 cm2
Height: 60 in
S' Lateral: 2.7 cm
Weight: 1950.63 oz

## 2022-04-20 LAB — BASIC METABOLIC PANEL
Anion gap: 6 (ref 5–15)
BUN: 7 mg/dL — ABNORMAL LOW (ref 8–23)
CO2: 35 mmol/L — ABNORMAL HIGH (ref 22–32)
Calcium: 9.7 mg/dL (ref 8.9–10.3)
Chloride: 96 mmol/L — ABNORMAL LOW (ref 98–111)
Creatinine, Ser: 0.65 mg/dL (ref 0.44–1.00)
GFR, Estimated: >60 mL/min (ref >60)
Glucose, Bld: 130 mg/dL — ABNORMAL HIGH (ref 70–99)
Potassium: 3.5 mmol/L (ref 3.5–5.1)
Sodium: 137 mmol/L (ref 135–145)

## 2022-04-20 MED ORDER — PREDNISONE 20 MG PO TABS
40.0000 mg | ORAL_TABLET | Freq: Every day | ORAL | 0 refills | Status: AC
  Filled 2022-04-20: qty 4, 2d supply, fill #0

## 2022-04-20 MED ORDER — APIXABAN 5 MG PO TABS
10.0000 mg | ORAL_TABLET | Freq: Two times a day (BID) | ORAL | Status: DC
  Administered 2022-04-20: 10 mg via ORAL
  Filled 2022-04-20: qty 2

## 2022-04-20 MED ORDER — APIXABAN 5 MG PO TABS: 5.0000 mg | ORAL_TABLET | Freq: Two times a day (BID) | ORAL | Status: DC

## 2022-04-20 MED ORDER — CEFDINIR 300 MG PO CAPS
300.0000 mg | ORAL_CAPSULE | Freq: Two times a day (BID) | ORAL | 0 refills | Status: AC
  Filled 2022-04-20: qty 6, 3d supply, fill #0

## 2022-04-20 MED ORDER — APIXABAN 5 MG PO TABS: 5.0000 mg | ORAL_TABLET | Freq: Two times a day (BID) | ORAL | 1 refills | Status: DC

## 2022-04-20 MED ORDER — OXYCODONE HCL 5 MG PO TABS
5.0000 mg | ORAL_TABLET | Freq: Once | ORAL | Status: AC | PRN
  Administered 2022-04-20: 5 mg via ORAL
  Filled 2022-04-20: qty 1

## 2022-04-20 MED ORDER — APIXABAN (ELIQUIS) VTE STARTER PACK (10MG AND 5MG)
ORAL_TABLET | ORAL | 0 refills | Status: DC
  Filled 2022-04-20: qty 74, 30d supply, fill #0

## 2022-04-20 NOTE — Evaluation (Addendum)
Physical Therapy Evaluation Patient Details Name: April [name] MRN: [id number] DOB: [birth year] Today's Date: 04/20/2022  History of Present Illness  82 year old female presents to the ER 04/18/22 after having an outpatient CAT scan for further workup of elevated LFTs and was noted to have incidental basilar pulmonary embolism .CT abdomen pelvis demonstrated bilateral lower lobe emboli.CT scan also showed gallstones with a distended gallbladder and biliary sludge.RA SpO2 88%. Placed on 3L O2.Admitted for treatment of acute pulmonary embolism, acute respiratory failure with hypoxia. Acute bronchitis, and cholelithiasis.  PMH: hypertension, OSA on BiPAP, type 2 diabetes on insulin, hyperlipidemia, hypothyroidism, history of tracheobronchomalacia, coronary disease  Clinical Impression  PTA pt living alone in single story home with ramped entrance. Pt's family has been providing 24 hour care since falls 3-4 weeks ago. Pt reports she has been getting around using a RW household distances and has been doing her bathing and dressing. PT noted to have bruising on R ankle on examination and reports that it was from her prior fall. With deeper chart review after session. Pt found to have R ankle tib-fib fx. MD made aware and RN to contact OP Orthopedics for clarification on WB status. Pt able to perform bed mobility with min A, transfer to RW with minA and from lower toilet with modA, ambulates in room with min physical assist and increased cuing for safety.   PT recommends HHPT at discharge. PT will continue to follow acutely. Recommend OT consult     Recommendations for follow up therapy are one component of a multi-disciplinary discharge planning process, led by the attending physician.  Recommendations may be updated based on patient status, additional functional criteria and insurance authorization.  Follow Up Recommendations Home health PT      Assistance Recommended at Discharge Frequent or constant  Supervision/Assistance  Patient can return home with the following  A little help with walking and/or transfers;A little help with bathing/dressing/bathroom;Assistance with cooking/housework;Direct supervision/assist for medications management;Direct supervision/assist for financial management;Assist for transportation;Help with stairs or ramp for entrance    Equipment Recommendations Wheelchair (measurements PT);Wheelchair cushion (measurements PT) (in NWB through R LE)  Recommendations for Other Services  OT consult    Functional Status Assessment Patient has had a recent decline in their functional status and demonstrates the ability to make significant improvements in function in a reasonable and predictable amount of time.     Precautions / Restrictions Precautions Precautions: Fall Precaution Comments: hx of falls Restrictions Weight Bearing Restrictions: Yes RLE Weight Bearing:  (unsure) Other Position/Activity Restrictions: reviewed notes prior to most recent hospitalization and found to have tib-fib fx 2/22, MD at time recommended CAM boot, no recommendation for WB      Mobility  Bed Mobility Overal bed mobility: Needs Assistance Bed Mobility: Supine to Sit     Supine to sit: Min assist, HOB elevated     General bed mobility comments: pt able to reach across to bed rail, requires min A for pad scoot of hips to EoB, pt reports dizziness with coming to EoB BP 138/55, dizziness dissipates quickly    Transfers Overall transfer level: Needs assistance Equipment used: Rolling walker (2 wheels) Transfers: Sit to/from Stand Sit to Stand: Min assist           General transfer comment: good power up min A for steadying at RW from bed modA and use of handrail for power up from low toilet    Ambulation/Gait Ambulation/Gait assistance: Min assist Gait Distance (Feet): 12 Feet (16) Assistive  device: Rollator (4 wheels) Gait Pattern/deviations: Trunk flexed Gait  velocity: slowed Gait velocity interpretation: <1.31 ft/sec, indicative of household ambulator   General Gait Details: min A for steadying, constant vc for proximity to RW, and navigation around obstacles in room      Balance Overall balance assessment: Needs assistance Sitting-balance support: Feet supported, No upper extremity supported Sitting balance-Leahy Scale: Fair     Standing balance support: Bilateral upper extremity supported, No upper extremity supported, During functional activity, Reliant on assistive device for balance Standing balance-Leahy Scale: Poor                               Pertinent Vitals/Pain Pain Assessment Pain Assessment: 0-10 Pain Score: 8  Pain Location: back with ambulation Pain Descriptors / Indicators: Sore, Aching Pain Intervention(s): Limited activity within patient's tolerance, Monitored during session, Repositioned    Home Living Family/patient expects to be discharged to:: Private residence Living Arrangements: Children;Alone Available Help at Discharge: Family;Available 24 hours/day Type of Home: Mobile home Home Access: Ramped entrance       Home Layout: One level Home Equipment: Rolling Walker (2 wheels);Grab bars - tub/shower;Hand held shower head;Tub bench Additional Comments: lived alone    Prior Function Prior Level of Function : Needs assist             Mobility Comments: intermittently walking with RW, more often since she was sick and experienced numerous falls, including 2/22 with R ankle fx          Extremity/Trunk Assessment   Upper Extremity Assessment Upper Extremity Assessment: Overall WFL for tasks assessed;Generalized weakness    Lower Extremity Assessment Lower Extremity Assessment: RLE deficits/detail;LLE deficits/detail RLE Deficits / Details: on examination pt found to have bruising on her R ankle which she reported was from a fall, PT further reviewed charts prior to hospitialization  and was found to have a R ankle tib/fib fracture with recommendations for CAM boot, generalized weakness in LE LLE Deficits / Details: generalized weakness    Cervical / Trunk Assessment Cervical / Trunk Assessment: Kyphotic  Communication   Communication: HOH (wears hearing aides at baseline)  Cognition Arousal/Alertness: Awake/alert Behavior During Therapy: WFL for tasks assessed/performed Overall Cognitive Status: Impaired/Different from baseline Area of Impairment: Following commands, Safety/judgement, Problem solving                       Following Commands: Follows multi-step commands with increased time, Follows multi-step commands inconsistently Safety/Judgement: Decreased awareness of safety   Problem Solving: Requires verbal cues, Requires tactile cues, Slow processing          General Comments General comments (skin integrity, edema, etc.): Pt on 3L O2 via [place] on entry SpO2 96%O2, removed and SpO2 dropped to 88-89 improved with coughing and clearing phlegm, but returned to high 80s. Returned to 3L O2 for ambulation to restroom and back. Able to maintain SpO2 >90%O2 at rest in recliner on 2L O2 via [place]        Assessment/Plan    PT Assessment Patient needs continued PT services  PT Problem List Decreased strength;Decreased activity tolerance;Decreased balance;Decreased mobility;Decreased safety awareness;Cardiopulmonary status limiting activity;Pain       PT Treatment Interventions DME instruction;Gait training;Functional mobility training;Therapeutic activities;Therapeutic exercise;Balance training;Cognitive remediation;Patient/family education    PT Goals (Current goals can be found in the Care Plan section)  Acute Rehab PT Goals Patient Stated Goal: cough less PT Goal Formulation:  With patient/family Time For Goal Achievement: 05/04/22 Potential to Achieve Goals: Fair    Frequency Min 3X/week        AM-PAC PT "6 Clicks" Mobility  Outcome Measure  Help needed turning from your back to your side while in a flat bed without using bedrails?: None Help needed moving from lying on your back to sitting on the side of a flat bed without using bedrails?: A Little Help needed moving to and from a bed to a chair (including a wheelchair)?: A Little Help needed standing up from a chair using your arms (e.g., wheelchair or bedside chair)?: A Little Help needed to walk in hospital room?: A Little Help needed climbing 3-5 steps with a railing? : Total 6 Click Score: 17    End of Session Equipment Utilized During Treatment: Oxygen Activity Tolerance: Patient tolerated treatment well Patient left: in chair;with call bell/phone within reach;with chair alarm set;with family/visitor present Nurse Communication: Mobility status PT Visit Diagnosis: Unsteadiness on feet (R26.81);Other abnormalities of gait and mobility (R26.89);Muscle weakness (generalized) (M62.81);History of falling (Z91.81);Repeated falls (R29.6);Pain Pain - part of body:  (back with walking)    Time: [id number] PT Time Calculation (min) (ACUTE ONLY): 44 min   Charges:   PT Evaluation $PT Eval Moderate Complexity: 1 Mod PT Treatments $Gait Training: 8-22 mins $Therapeutic Activity: 8-22 mins        Latera Mclin B. Migdalia Dk PT, DPT Acute Rehabilitation Services Please use secure chat or  Call Office [phone]   [place] 04/20/2022, 11:49 AM

## 2022-04-20 NOTE — Progress Notes (Signed)
Echocardiogram 2D Echocardiogram has been performed.  April [name] 04/20/2022, 1:39 PM

## 2022-04-20 NOTE — Plan of Care (Signed)
DISCHARGE NOTE HOME April [name] to be discharged home per MD order. Discussed prescriptions and follow up appointments with the patient. Prescriptions given to patient; medication list explained in detail. Patient verbalized understanding.  Patient free of lines, drains, and wounds.   An After Visit Summary (AVS) was printed and given to the patient. Patient escorted via wheelchair, and discharged home via private auto.  Stephan Minister, RN

## 2022-04-20 NOTE — Progress Notes (Signed)
Orthopedic Tech Progress Note Patient Details:  April [name] Jan 27, 1941 [id number]  Ortho Devices Type of Ortho Device: CAM walker, Lumbar corsett Ortho Device/Splint Location: RLE,BACK Ortho Device/Splint Interventions: Ordered   Post Interventions Patient Tolerated: Well Instructions Provided: [facility] [place] 04/20/2022, 6:39 PM

## 2022-04-20 NOTE — Progress Notes (Signed)
SATURATION QUALIFICATIONS: (This note is used to comply with regulatory documentation for home oxygen)  Patient Saturations on Room Air at Rest = 88%  Patient Saturations on Room Air while Ambulating = 82%  Patient Saturations on 2 Liters of oxygen while Ambulating = 94%

## 2022-04-20 NOTE — TOC Transition Note (Addendum)
Transition of Care (TOC) - CM/SW Discharge Note Marvetta Gibbons RN, BSN Transitions of Care Unit 4E- RN Case Manager See Treatment Team for direct phone #   Patient Details  Name: April [name] MRN: [id number] Date of Birth: Jul 11, 1940  Transition of Care [facility]) CM/SW Contact:  Dawayne Patricia, RN Phone Number: 04/20/2022, 4:21 PM   Clinical Narrative:    Pt stable for transition home today, orders placed for HHPT.  CM in to speak with pt at bedside, family (son) also present. Discussed HH and DME needs- pt agreeable to [facility] services- list provided for [facility] choice Per CMS guidelines from [email or web address] website with star ratings (copy placed in shadow chart)- request that CM reach out to daughter-in-law Ebony Hail- to discuss [place].  Per pt she as RW at home and discussed w/c recommendation- does not feel w/c would fit in her mobile home- declines w/c need at this time- and no other DME needs noted other than home 02.  Per pt she has home BIPAP with [facility]- reports that she used to have 02 as well at night but recently turned that in. CM will have home 02 set back up under [employer]- pt agreeable.   Address, phone # and PCP all confirmed.  Pt's home phone # is [phone] Family to transport home  PharmD to discuss Eliquis with pt, they are aware of $47 copay, TOC pharmacy to fill starter pack with 30day free coupon and deliver to bedside prior to discharge.   Call made to daughter-in-law Ebony Hail- VM left and return call received- Per Ebony Hail- they do not have a preference for [facility] and will defer to CM to secure one that is in network and has higher star ratings if possible.   Call made to [facility] for home 02 referral- referral accepted and [facility] will deliver transport unit to room for discharge- home unit will be delivered later once pt home.   Call made to [facility] w/ [facility] for HHPT/OT - referral has been accepted- and they anticipate start of care tomorrow 3/8. Will call pt to  schedule.        Final next level of care: Home w Home Health Services Barriers to Discharge: No Barriers Identified   Patient Goals and CMS Choice CMS Medicare.gov Compare Post Acute Care list provided to:: Patient Choice offered to / list presented to : Patient, Adult Children  Discharge Placement                 Home w/ [facility]        Discharge Plan and Services Additional resources added to the After Visit Summary for     Discharge Planning Services: CM Consult Post Acute Care Choice: Durable Medical Equipment, Home Health          DME Arranged: Oxygen DME Agency: [place] Date DME Agency Contacted: 04/20/22 Time DME Agency Contacted: [phone] Representative spoke with at DME Agency: [place]: PT [place]: [place] ([place]) Date [place]: 04/20/22 Time [place]: [place] Representative spoke with at [place]: [place] ([place]) Interventions SDOH Screenings   Food Insecurity: No Food Insecurity (04/19/2022)  Housing: Low Risk  (04/19/2022)  Transportation Needs: No Transportation Needs (04/19/2022)  Utilities: Not At Risk (04/19/2022)  Alcohol Screen: Low Risk  (07/09/2020)  Depression (PHQ2-9): Low Risk  (04/04/2022)  Financial Resource Strain: Low Risk  (07/09/2020)  Physical Activity: Inactive (07/09/2020)  Social Connections: Moderately Integrated (07/09/2020)  Stress: No  Stress Concern Present (07/09/2020)  Tobacco Use: Medium Risk (04/19/2022)     Readmission Risk Interventions    04/20/2022    4:02 PM  Readmission Risk Prevention Plan  Post Dischage Appt Complete  Medication Screening Complete  Transportation Screening Complete

## 2022-04-20 NOTE — Evaluation (Signed)
Occupational Therapy Evaluation Patient Details Name: April [name] MRN: [id number] DOB: 1940/07/02 Today's Date: 04/20/2022   History of Present Illness 82 year old female presents to the ER 04/18/22 after having an outpatient CAT scan for further workup of elevated LFTs and was noted to have incidental basilar pulmonary embolism .CT abdomen pelvis demonstrated bilateral lower lobe emboli.CT scan also showed gallstones with a distended gallbladder and biliary sludge.RA SpO2 88%. Placed on 3L O2.Admitted for treatment of acute pulmonary embolism, acute respiratory failure with hypoxia. Acute bronchitis, and cholelithiasis.  PMH: hypertension, OSA on BiPAP, type 2 diabetes on insulin, hyperlipidemia, hypothyroidism, history of tracheobronchomalacia, coronary disease   Clinical Impression   Patient admitted for the diagnosis above.  PTA she lives at home alone, but has a strong support system from family.  She was able to complete her own ADL, but family assisted with iADL, community mobility and can stay as needed with her.  Currently she is needing up to [facility] for basic mobility and up to [place] for lower body ADL.  Patient plans on returning home, and is open to [facility] OT post acute.  OT will follow in the acute setting to address deficits listed.        Recommendations for follow up therapy are one component of a multi-disciplinary discharge planning process, led by the attending physician.  Recommendations may be updated based on patient status, additional functional criteria and insurance authorization.   Follow Up Recommendations  Home health OT     Assistance Recommended at Discharge Intermittent Supervision/Assistance  Patient can return home with the following Assist for transportation;Assistance with cooking/housework;A little help with bathing/dressing/bathroom;A little help with walking and/or transfers    Functional Status Assessment  Patient has had a recent decline in their  functional status and demonstrates the ability to make significant improvements in function in a reasonable and predictable amount of time.  Equipment Recommendations  None recommended by OT    Recommendations for Other Services       Precautions / Restrictions Precautions Precautions: Fall Precaution Comments: watch O2 sats Restrictions LLE Weight Bearing: Weight bearing as tolerated      Mobility Bed Mobility Overal bed mobility: Needs Assistance Bed Mobility: Supine to Sit, Sit to Supine     Supine to sit: Supervision Sit to supine: Min assist        Transfers Overall transfer level: Needs assistance Equipment used: Rolling walker (2 wheels) Transfers: Sit to/from Stand Sit to Stand: Min guard                  Balance Overall balance assessment: Needs assistance Sitting-balance support: Feet supported, No upper extremity supported Sitting balance-Leahy Scale: Fair     Standing balance support: Reliant on assistive device for balance Standing balance-Leahy Scale: Poor                             ADL either performed or assessed with clinical judgement   ADL       Grooming: Wash/dry hands;Wash/dry face;Set up;Sitting           Upper Body Dressing : Set up;Sitting   Lower Body Dressing: Minimal assistance;Sit to/from stand   Toilet Transfer: Min guard;Rolling walker (2 wheels);Regular Toilet;Ambulation                   Vision Patient Visual Report: No change from baseline       Perception  Praxis      Pertinent Vitals/Pain Pain Assessment Pain Assessment: Faces Faces Pain Scale: Hurts little more Pain Location: back with ambulation Pain Descriptors / Indicators: Sore, Aching     Hand Dominance Right   Extremity/Trunk Assessment Upper Extremity Assessment Upper Extremity Assessment: Overall WFL for tasks assessed   Lower Extremity Assessment Lower Extremity Assessment: Defer to PT evaluation   Cervical  / Trunk Assessment Cervical / Trunk Assessment: Kyphotic   Communication Communication Communication: HOH   Cognition Arousal/Alertness: Awake/alert Behavior During Therapy: WFL for tasks assessed/performed   Area of Impairment: Problem solving                             Problem Solving: Slow processing, Requires verbal cues       General Comments   Patient desaturated to 82% on RA with in room mobility.  Supplemental O2 returned at 2L    Exercises     Shoulder Instructions      Home Living Family/patient expects to be discharged to:: Private residence Living Arrangements: Children;Alone Available Help at Discharge: Family;Available 24 hours/day Type of Home: Mobile home Home Access: Ramped entrance     Home Layout: One level     Bathroom Shower/Tub: Teacher, early years/pre: Standard Bathroom Accessibility: Yes How Accessible: Accessible via walker Home Equipment: [place] (2 wheels);Grab bars - tub/shower;Hand held shower head;Tub bench          Prior Functioning/Environment Prior Level of Function : Needs assist             Mobility Comments: intermittently walking with RW, more often since she was sick and experienced numerous falls, including 2/22 with R ankle fx ADLs Comments: Family assists with iADL, medications, yard work and community mobility.  Patient able to complete light meal prep and ADL from a sit to stand level        OT Problem List: Decreased strength;Decreased activity tolerance;Impaired balance (sitting and/or standing);Pain      OT Treatment/Interventions: Self-care/ADL training;Therapeutic activities;Patient/family education;Balance training    OT Goals(Current goals can be found in the care plan section) Acute Rehab OT Goals Patient Stated Goal: Return home OT Goal Formulation: With patient Time For Goal Achievement: 05/04/22 Potential to Achieve Goals: Good ADL Goals Pt Will Perform Grooming:  with modified independence;standing Pt Will Perform Lower Body Dressing: with modified independence;sit to/from stand Pt Will Transfer to Toilet: with modified independence;ambulating;regular height toilet  OT Frequency: Min 2X/week    Co-evaluation              AM-PAC OT "6 Clicks" Daily Activity     Outcome Measure Help from another person eating meals?: None Help from another person taking care of personal grooming?: A Little Help from another person toileting, which includes using toliet, bedpan, or urinal?: A Little Help from another person bathing (including washing, rinsing, drying)?: A Little Help from another person to put on and taking off regular upper body clothing?: None Help from another person to put on and taking off regular lower body clothing?: A Little 6 Click Score: 20   End of Session Equipment Utilized During Treatment: Rolling walker (2 wheels) Nurse Communication: Mobility status  Activity Tolerance: Patient tolerated treatment well Patient left: in bed;with call bell/phone within reach;with family/visitor present  OT Visit Diagnosis: Unsteadiness on feet (R26.81);Muscle weakness (generalized) (M62.81);History of falling (Z91.81)  Time: [id number] OT Time Calculation (min): 15 min Charges:  OT General Charges $OT Visit: 1 Visit OT Evaluation $OT Eval Moderate Complexity: 1 Mod  04/20/2022  RP, OTR/L  Acute Rehabilitation Services  Office:  [phone]   Metta Clines 04/20/2022, 3:49 PM

## 2022-04-20 NOTE — Progress Notes (Addendum)
ANTICOAGULATION CONSULT NOTE - Follow-Up Consult  Pharmacy Consult for heparin>>apixaban Indication: pulmonary embolus  Allergies  Allergen Reactions   Neomycin Other (See Comments)    Visual disturbance and swelling in eyes   Patient Measurements: Height: 5' (152.4 cm) Weight: 55.3 kg (121 lb 14.6 oz) IBW/kg (Calculated) : 45.5 kg Heparin Dosing Weight: 54 kg  Vital Signs: Temp: 98 F (36.7 C) (03/07 1053) Temp Source: Oral (03/07 1053) BP: 101/87 (03/07 1053) Pulse Rate: 66 (03/07 1053)  Labs: Recent Labs    04/18/22 1829 04/18/22 2130 04/18/22 2130 04/19/22 0343 04/19/22 1007 04/19/22 1915 04/20/22 0127 04/20/22 1005  HGB  --  13.1   < > 13.6  --   --  12.6  --   HCT  --  40.3  --  43.1  --   --  39.9  --   PLT  --  221  --  297  --   --  303  --   HEPARINUNFRC  --   --    < > 0.44 0.35 0.46  --  0.89*  CREATININE 0.51  --   --   --   --   --  0.65  --   TROPONINIHS 7  --   --   --   --   --   --   --    < > = values in this interval not displayed.    Estimated Creatinine Clearance: 43.7 mL/min (by C-G formula based on SCr of 0.65 mg/dL).  Medical History: Past Medical History:  Diagnosis Date   Abnormal weight gain    Acute diverticulitis 05/13/2021   Arthritis    "joints" (06/13/2013)   COPD (chronic obstructive pulmonary disease) (HCC)    Coronary artery disease, non-occlusive 06/13/2013   Cardiac Cath: sharp Angle take-off of Large Dominant Cx: ~70-80% mid Cx bifurcation lesion (Lateral OM &  AVGCx-PL-PDA both with hairpin ostial & ~50% lesions) - Not PCI amenable due to vessel tortuosity; ~40-50% mid LAD;[place] - no significnat diseaes; small non-dominant RCA   Diverticulosis    GERD (gastroesophageal reflux disease)    History of stress test    a. 09/2006 nl Dobutamine Echo   Hyperlipidemia    Hypertension    NIDDM (non-insulin dependent diabetes mellitus)    On home oxygen therapy    "2L q hs; runs into my BIPAP" (06/13/2013)   OSA (obstructive  sleep apnea)    "BIPAP w/O2" (06/13/2013)   Tracheobronchomalacia    a. followed by [facility]   Assessment: [name] admitted for acute PE. No anticoagulation prior to admission. Pharmacy consulted to dose and manage heparin infusion for PE.  Heparin level therapeutic (0.89) on infusion at 950 units/hr. No bleeding noted.  Addendum  Change heparin to apixaban today.   Goal of Therapy:  Monitor platelets by anticoagulation protocol: Yes   Plan:  Dc heparin Apixaban '10mg'$  PO BID x7 days then '5mg'$  BID Rx will follow peripherally Starter pack sent to [place], PharmD, BCIDP, AAHIVP, CPP Infectious Disease Pharmacist 04/20/2022 3:23 PM

## 2022-04-20 NOTE — Discharge Summary (Addendum)
Physician Discharge Summary  DUDLEY CAIRE [id number] DOB: 1940-02-28  PCP: Susy Frizzle, MD  Admitted from: Home Discharged to: Home  Admit date: 04/18/2022 Discharge date: 04/20/2022  Recommendations for Outpatient Follow-up:    Follow-up Information     Susy Frizzle, MD. Schedule an appointment as soon as possible for a visit in 1 week(s).   Specialty: Family Medicine Why: To be seen with repeat labs (CBC & CMP).  Patient needs to follow-up with orthopedics Percell Miller and Noemi Chapel) regarding her right leg and L1 fractures. Contact information: [place] [address] [phone]         [facility] Follow up.   Why: ([place])- [phone]- HHPT arranged- they will contact you to schedule        [facility] Follow up.   Why: Home 02 arranged- they will deliver portable unit to room for transport home- and then will contact you to deliver home unit Contact information: [place] [address] [phone]                  Home Health: [place] Orders (From admission, onward)     Start     Ordered   04/20/22 [place]  At discharge       Question:  To provide the following care/treatments  Answer:  PT & OT   04/20/22 1518             Equipment/Devices:     Durable Medical Equipment  (From admission, onward)           Start     Ordered   04/20/22 1609  DME Oxygen  Once       Question Answer Comment  Length of Need 6 Months   Mode or (Route) Nasal cannula   Liters per Minute 2   Frequency Continuous (stationary and portable oxygen unit needed)   Oxygen conserving device Yes   Oxygen delivery system Gas      04/20/22 1610             Discharge Condition: Improved and stable.   Code Status: Full Code Diet recommendation:  Discharge Diet Orders (From admission, onward)     Start     Ordered   04/20/22 0000  Diet - low sodium heart healthy         04/20/22 1610   04/20/22 0000  Diet Carb Modified        04/20/22 1610             Discharge Diagnoses:  Principal Problem:   Acute pulmonary embolism ([place]) Active Problems:   Acute respiratory failure with hypoxia (H[facility])   Wheezing   OSA treated with BiPAP   Hyperlipidemia associated with type 2 diabetes mellitus ([place])   Essential hypertension   Type 2 diabetes mellitus treated with insulin ([place])   Hypothyroid   Cholelithiasis   Brief Summary: 82 year old female, lives by herself, medical history significant for hypertension, OSA on nightly BiPAP, type II DM on insulin, hyperlipidemia, hypothyroidism, tracheobronchomalacia, CAD, recent L1 and right lower extremity fracture with relative immobility, recent E. coli UTI treated with sensitive levofloxacin, evaluated by PCP with CT abdomen for elevated LFTs and noted to have incidental basilar pulmonary embolism.  Advised to present to the ED for further evaluation.  Room air oxygen saturations were 88% and patient was started on 3 L/min oxygen.  Initially on IV heparin which was transitioned to Eliquis  prior to discharge.     Assessment & Plan:    Acute pulmonary embolism: Suspected due to relative immobility from L1 fracture and right lower extremity fracture.  Initially mild intermittent tachypnea, mild tachycardia but not hypotensive.  Hypoxic on 2 L/min [place] oxygen.  Lower extremity venous Dopplers negative for DVT.  Started on IV heparin.  There was no dedicated CTA chest done.  TTE 3/7: LVEF 60-65%.  RV systolic function and size normal.  Mild aortic valve stenosis.  Discussed risks and benefits of anticoagulation, individual options available including warfarin, Xarelto and Eliquis risks and benefits of each and side effects and patient and family opted for Eliquis.  Transitioned to Eliquis prior to discharge.  Duration of anticoagulation: 3 to 6 months, but will defer to her PCP decision.  Patient reports no prior personal  history or family history of VTE, no personal history of cancer and no bleeding history.   Acute respiratory failure with hypoxia: Suspect multifactorial due to acute pulmonary embolism, underlying OSA and atelectasis.  Incentive spirometry.  Qualifies for home O2 as follows:  SATURATION QUALIFICATIONS: (This note is used to comply with regulatory documentation for home oxygen)   Patient Saturations on Room Air at Rest = 88%   Patient Saturations on Room Air while Ambulating = 82%   Patient Saturations on 2 Liters of oxygen while Ambulating = 94%  Acute bronchitis/recent pneumonia treatment: On admission, reportedly had wheezing.  Also had hypoxia.  Recently treated for UTI/pneumonia 2 weeks prior with 5 to 7 days of levofloxacin.  Procalcitonin 0.43.  Complete total 5 days of empirically started Omnicef and short course of prednisone.   Cholelithiasis: Seen on CT and RUQ ultrasound.  No abdominal pain or other GI symptoms.  Did have elevated LFTs about 2 weeks ago.  As per H&P, report per daughter-in-law, patient was taking 1500 mg of Tylenol every 4 hours at home for several days as per H&P.  LFTs have normalized.  Family have been counseled regarding maximum dose of Tylenol to be 4 g/day and preferably 3 g/day.   Hypothyroid Stable. TSH 1.72 two weeks ago in office.   Type 2 diabetes mellitus treated with insulin ([place]) Continue prior home dose of insulins and metformin.  A1c 7.3 on 04/04/2022.   Essential hypertension Stable.    Hyperlipidemia associated with type 2 diabetes mellitus (HCC) Stable. On crestor.   OSA treated with BiPAP Stable. Continue bipap. Hx of Tracheobronchomalacia    Reported L1 and RLE fracture/distal fibular: As per family's report, was not felt to be a surgical candidate.  Supportive care.  I communicated with orthopedic PA on call who advised that patient had been seen at [facility] and [facility]opedics urgent care only for L1 fracture but not for the  ankle.  He advised a cam boot for the ankle, weightbearing as tolerated, back brace when upright and follow-up at [facility].   Body mass index is 23.16 kg/m.     Consultants:       Procedures:      Discharge Instructions  Discharge Instructions     (HEART FAILURE PATIENTS) Call MD:  Anytime you have any of the following symptoms: 1) 3 pound weight gain in 24 hours or 5 pounds in 1 week 2) shortness of breath, with or without a dry hacking cough 3) swelling in the hands, feet or stomach 4) if you have to sleep on extra pillows at night in order to breathe.   Complete by: As  directed    Call MD for:  difficulty breathing, headache or visual disturbances   Complete by: As directed    Call MD for:  extreme fatigue   Complete by: As directed    Call MD for:  persistant dizziness or light-headedness   Complete by: As directed    Call MD for:  persistant nausea and vomiting   Complete by: As directed    Call MD for:  severe uncontrolled pain   Complete by: As directed    Call MD for:  temperature >100.4   Complete by: As directed    Diet - low sodium heart healthy   Complete by: As directed    Diet Carb Modified   Complete by: As directed    Increase activity slowly   Complete by: As directed         Medication List     TAKE these medications    albuterol 108 (90 Base) MCG/ACT inhaler Commonly known as: VENTOLIN HFA Inhale 2 puffs into the lungs every 6 (six) hours as needed for wheezing or shortness of breath (Cough).   CALCIUM 600 + D PO Take 1 tablet by mouth daily.   cefdinir 300 MG capsule Commonly known as: OMNICEF Take 1 capsule (300 mg total) by mouth 2 (two) times daily for 3 days.   Eliquis DVT/PE Starter Pack Generic drug: Apixaban Starter Pack ('10mg'$  and '5mg'$ ) Take as directed on package: start with two-'5mg'$  tablets twice daily for 7 days. On day 8, switch to one-'5mg'$  tablet twice daily.   escitalopram 10 MG tablet Commonly known as:  Lexapro Take 1 tablet (10 mg total) by mouth daily.   Fish Oil 1000 MG Caps Take 1 capsule by mouth daily.   gabapentin 600 MG tablet Commonly known as: NEURONTIN TAKE 1 TABLET BY MOUTH 4 TIMES DAILY   guaifenesin 400 MG Tabs tablet Commonly known as: HUMIBID E Take 1 tablet 3 times daily as needed for chest congestion and cough   isosorbide mononitrate 60 MG 24 hr tablet Commonly known as: IMDUR TAKE 1 TABLET BY MOUTH ONCE DAILY   Lantus SoloStar 100 UNIT/ML Solostar Pen Generic drug: insulin glargine INJECT 40-50 UNITS UNDER THE SKIN ONCE EVERY NIGHT AT BEDTIME.   metFORMIN 500 MG tablet Commonly known as: GLUCOPHAGE TAKE 1 TABLET BY MOUTH TWICE (2) DAILY WITH A MEAL   metoprolol tartrate 25 MG tablet Commonly known as: LOPRESSOR TAKE 1 TABLET BY MOUTH TWICE A DAY   multivitamin capsule Take 1 capsule by mouth daily.   omeprazole 20 MG capsule Commonly known as: PRILOSEC Take 20 mg by mouth daily.   ondansetron 4 MG disintegrating tablet Commonly known as: ZOFRAN-ODT Take 1 tablet (4 mg total) by mouth every 8 (eight) hours as needed for nausea or vomiting.   OneTouch Verio test strip Generic drug: glucose blood USE AS DIRECTED TO MONITOR BLOOD SUGARS UP TO 3 TIMES DAILY AS NEEDED   potassium chloride 10 MEQ tablet Commonly known as: KLOR-CON TAKE 1 TABLET BY MOUTH TWICE A DAY   predniSONE 20 MG tablet Commonly known as: DELTASONE Take 2 tablets (40 mg total) by mouth daily with breakfast for 2 days. Start taking on: April 21, 2022   promethazine-dextromethorphan 6.25-15 MG/5ML syrup Commonly known as: PROMETHAZINE-DM Take 2.5 mLs by mouth at bedtime as needed for cough.   rosuvastatin 40 MG tablet Commonly known as: CRESTOR TAKE ONE TABLET BY MOUTH ONCE DAILY   torsemide 20 MG tablet Commonly known as: DEMADEX TAKE  1 TABLET BY MOUTH ONCE DAILY   traMADol 50 MG tablet Commonly known as: ULTRAM take 1 tablet by oral route at bedtime as needed for  severe pain       Allergies  Allergen Reactions   Neomycin Other (See Comments)    Visual disturbance and swelling in eyes      Procedures/Studies: ECHOCARDIOGRAM COMPLETE  Result Date: 04/20/2022    ECHOCARDIOGRAM REPORT   Patient Name:   GWENDLYON BENSTON Carlin Date of Exam: 04/20/2022 Medical Rec #:  [id number]      Height:       60.0 in Accession #:    [id number]     Weight:       121.9 lb Date of Birth:  1941-02-04      BSA:          1.512 m Patient Age:    82 years       BP:           116/62 mmHg Patient Gender: F              HR:           65 bpm. Exam Location:  Inpatient Procedure: 2D Echo, Cardiac Doppler and Color Doppler Indications:    Pulmonary Embolus I26.09  History:        Patient has prior history of Echocardiogram examinations, most                 recent 11/18/2020. Angina, Signs/Symptoms:Dyspnea; Risk                 Factors:Hypertension, Diabetes, Dyslipidemia and Sleep Apnea.  Sonographer:    Ronny Flurry Referring Phys: [place]:[id number] [place]  1. Left ventricular ejection fraction, by estimation, is 60 to 65%. The left ventricle has normal function. The left ventricle has no regional wall motion abnormalities. Left ventricular diastolic parameters were normal.  2. Right ventricular systolic function is normal. The right ventricular size is normal. Tricuspid regurgitation signal is inadequate for assessing PA pressure.  3. The mitral valve is normal in structure. No evidence of mitral valve regurgitation. No evidence of mitral stenosis.  4. The aortic valve is tricuspid. There is mild calcification of the aortic valve. There is mild thickening of the aortic valve. Aortic valve regurgitation is not visualized. Mild aortic valve stenosis.  5. The inferior vena cava is normal in size with greater than 50% respiratory variability, suggesting right atrial pressure of 3 mmHg. FINDINGS  Left Ventricle: Left ventricular ejection fraction, by estimation, is 60 to 65%. The left  ventricle has normal function. The left ventricle has no regional wall motion abnormalities. The left ventricular internal cavity size was normal in size. There is  no left ventricular hypertrophy. Left ventricular diastolic parameters were normal. Right Ventricle: The right ventricular size is normal. No increase in right ventricular wall thickness. Right ventricular systolic function is normal. Tricuspid regurgitation signal is inadequate for assessing PA pressure. Left Atrium: Left atrial size was normal in size. Right Atrium: Right atrial size was normal in size. Pericardium: There is no evidence of pericardial effusion. Mitral Valve: The mitral valve is normal in structure. No evidence of mitral valve regurgitation. No evidence of mitral valve stenosis. Tricuspid Valve: The tricuspid valve is normal in structure. Tricuspid valve regurgitation is not demonstrated. No evidence of tricuspid stenosis. Aortic Valve: The aortic valve is tricuspid. There is mild calcification of the aortic valve. There is mild thickening of the aortic valve. Aortic valve  regurgitation is not visualized. Mild aortic stenosis is present. Aortic valve mean gradient measures  9.6 mmHg. Aortic valve peak gradient measures 17.2 mmHg. Aortic valve area, by VTI measures 1.58 cm. Pulmonic Valve: The pulmonic valve was normal in structure. Pulmonic valve regurgitation is not visualized. No evidence of pulmonic stenosis. Aorta: The aortic root is normal in size and structure. Venous: The inferior vena cava is normal in size with greater than 50% respiratory variability, suggesting right atrial pressure of 3 mmHg. IAS/Shunts: No atrial level shunt detected by color flow Doppler.  LEFT VENTRICLE PLAX 2D LVIDd:         4.00 cm   Diastology LVIDs:         2.70 cm   LV e' medial:    6.09 cm/s LV PW:         0.90 cm   LV E/e' medial:  13.4 LV IVS:        0.70 cm   LV e' lateral:   8.92 cm/s LVOT diam:     2.00 cm   LV E/e' lateral: 9.1 LV SV:          73 LV SV Index:   48 LVOT Area:     3.14 cm  RIGHT VENTRICLE             IVC RV S prime:     11.00 cm/s  IVC diam: 2.10 cm TAPSE (M-mode): 2.0 cm LEFT ATRIUM           Index        RIGHT ATRIUM          Index LA diam:      2.60 cm 1.72 cm/m   RA Area:     8.10 cm LA Vol (A2C): 30.8 ml 20.40 ml/m  RA Volume:   13.30 ml 8.79 ml/m LA Vol (A4C): 52.6 ml 34.78 ml/m  AORTIC VALVE AV Area (Vmax):    1.54 cm AV Area (Vmean):   1.45 cm AV Area (VTI):     1.58 cm AV Vmax:           207.40 cm/s AV Vmean:          145.600 cm/s AV VTI:            0.459 m AV Peak Grad:      17.2 mmHg AV Mean Grad:      9.6 mmHg LVOT Vmax:         101.50 cm/s LVOT Vmean:        67.400 cm/s LVOT VTI:          0.232 m LVOT/AV VTI ratio: 0.50  AORTA Ao Root diam: 2.80 cm Ao Asc diam:  2.90 cm MITRAL VALVE MV Area (PHT): 3.48 cm    SHUNTS MV Decel Time: 218 msec    Systemic VTI:  0.23 m MV E velocity: 81.50 cm/s  Systemic Diam: 2.00 cm MV A velocity: 77.60 cm/s MV E/A ratio:  1.05 Mihai Croitoru MD Electronically signed by Sanda Klein MD Signature Date/Time: 04/20/2022/2:27:47 PM    Final    VAS [place] LOWER EXTREMITY VENOUS (DVT) (7a-7p)  Result Date: 04/18/2022  Lower Venous DVT Study Patient Name:  ALVENIA VARELLA Parmar  Date of Exam:   04/18/2022 Medical Rec #: [id number]       Accession #:    [id number] Date of Birth: July 26, 1940       Patient Gender: F Patient Age:   67 years Exam Location:  [facility] Procedure:  VAS [place] LOWER EXTREMITY VENOUS (DVT) Referring Phys: JON KNAPP --------------------------------------------------------------------------------  Indications: Pulmonary embolism.  Limitations: Poor ultrasound/tissue interface. Comparison Study: No previous exams Performing Technologist: Jody Hill RVT, RDMS  Examination Guidelines: A complete evaluation includes B-mode imaging, spectral Doppler, color Doppler, and power Doppler as needed of all accessible portions of each vessel. Bilateral testing is considered an integral part  of a complete examination. Limited examinations for reoccurring indications may be performed as noted. The reflux portion of the exam is performed with the patient in reverse Trendelenburg.  +---------+---------------+---------+-----------+----------+--------------+ RIGHT    CompressibilityPhasicitySpontaneityPropertiesThrombus Aging +---------+---------------+---------+-----------+----------+--------------+ CFV      Full           Yes      Yes                                 +---------+---------------+---------+-----------+----------+--------------+ SFJ      Full                                                        +---------+---------------+---------+-----------+----------+--------------+ FV Prox  Full           Yes      Yes                                 +---------+---------------+---------+-----------+----------+--------------+ FV Mid   Full           Yes      Yes                                 +---------+---------------+---------+-----------+----------+--------------+ FV DistalFull           Yes      Yes                                 +---------+---------------+---------+-----------+----------+--------------+ PFV      Full                                                        +---------+---------------+---------+-----------+----------+--------------+ POP      Full           Yes      Yes                                 +---------+---------------+---------+-----------+----------+--------------+ PTV      Full                                                        +---------+---------------+---------+-----------+----------+--------------+ PERO     Full                                                        +---------+---------------+---------+-----------+----------+--------------+   +---------+---------------+---------+-----------+----------+--------------+  LEFT     CompressibilityPhasicitySpontaneityPropertiesThrombus Aging  +---------+---------------+---------+-----------+----------+--------------+ CFV      Full           Yes      Yes                                 +---------+---------------+---------+-----------+----------+--------------+ SFJ      Full                                                        +---------+---------------+---------+-----------+----------+--------------+ FV Prox  Full           Yes      Yes                                 +---------+---------------+---------+-----------+----------+--------------+ FV Mid   Full           Yes      Yes                                 +---------+---------------+---------+-----------+----------+--------------+ FV DistalFull           Yes      Yes                                 +---------+---------------+---------+-----------+----------+--------------+ PFV      Full                                                        +---------+---------------+---------+-----------+----------+--------------+ POP      Full           Yes      Yes                                 +---------+---------------+---------+-----------+----------+--------------+ PTV      Full                                                        +---------+---------------+---------+-----------+----------+--------------+ PERO     Full                                                        +---------+---------------+---------+-----------+----------+--------------+     Summary: BILATERAL: - No evidence of deep vein thrombosis seen in the lower extremities, bilaterally. -No evidence of popliteal cyst, bilaterally.   *See table(s) above for measurements and observations. Electronically signed by Harold Barban MD on 04/18/2022 at 10:09:01 PM.    Final    DG Chest Portable 1 View  Result Date: 04/18/2022 CLINICAL DATA:  Cough  and known pulmonary embolism EXAM: PORTABLE CHEST 1 VIEW COMPARISON:  04/06/2018 FINDINGS: Cardiac shadow is stable. Aortic calcifications  are noted. Lungs are hypoinflated with mild right basilar atelectasis. No bony abnormality is noted. IMPRESSION: Mild right basilar atelectasis. Electronically Signed   By: Inez Catalina M.D.   On: 04/18/2022 19:34   US Abdomen Limited  Result Date: 04/18/2022 CLINICAL DATA:  History of cholelithiasis EXAM: ULTRASOUND ABDOMEN LIMITED RIGHT UPPER QUADRANT COMPARISON:  CT from earlier in the same day. FINDINGS: Gallbladder: Gallbladder is well distended with multiple gallstones within. The wall is mildly thickened at 3.8 mm. Negative sonographic Murphy's sign is noted however. Gallbladder sludge is seen. No pericholecystic fluid is seen. Common bile duct: Diameter: 5.9 mm. Liver: No focal lesion identified. Within normal limits in parenchymal echogenicity. Portal vein is patent on color Doppler imaging with normal direction of blood flow towards the liver. Other: None. IMPRESSION: Cholelithiasis and gallbladder sludge without complicating factors. Electronically Signed   By: Inez Catalina M.D.   On: 04/18/2022 19:31   CT Abdomen Pelvis W Contrast  Result Date: 04/18/2022 CLINICAL DATA:  Upper abdominal and back pain. Fall 3 weeks ago. Elevated liver function tests EXAM: CT ABDOMEN AND PELVIS WITH CONTRAST TECHNIQUE: Multidetector CT imaging of the abdomen and pelvis was performed using the standard protocol following bolus administration of intravenous contrast. RADIATION DOSE REDUCTION: This exam was performed according to the departmental dose-optimization program which includes automated exposure control, adjustment of the mA and/or kV according to patient size and/or use of iterative reconstruction technique. CONTRAST:  137m ISOVUE-300 IOPAMIDOL (ISOVUE-300) INJECTION 61% COMPARISON:  CT 05/12/2021 FINDINGS: Lower chest: There are reticular changes along the lung bases with some ground-glass and more confluent opacity in the right lower lobe. Atelectasis versus infiltrate. All the changes are increased from  the prior CT scan. No pleural effusion. Bilateral pulmonary emboli are noted at the edge of the imaging field. Moderate hiatal hernia. Hepatobiliary: No space-occupying liver lesion. Patent portal vein. The gallbladder is distended with sludge and stones. Please correlate with any symptoms. There is some pericholecystic fluid. Pancreas: Unremarkable. No pancreatic ductal dilatation or surrounding inflammatory changes. Spleen: Normal in size without focal abnormality. Adrenals/Urinary Tract: Adrenal glands are preserved. No enhancing renal mass or collecting system dilatation. There is heterogeneous enhancement of the kidneys bilaterally. No inflammatory stranding. This would have a differential including former nephritis, inflammatory or infectious. The ureters have normal course and caliber down to the bladder. Preserved contours of the urinary bladder. Stomach/Bowel: The large bowel has extensive colonic diverticulosis in particular along the sigmoid colon with some circular muscle hypertrophy. Normal appendix in the right lower quadrant. The large bowel on this non dilated. Small bowel is nondilated. Stomach is underdistended. Vascular/Lymphatic: Normal caliber aorta and IVC with some scattered vascular calcifications. No specific abnormal lymph node enlargement identified in the abdomen and pelvis. There are a few small prominent upper abdominal nodes are stable towards the porta hepatis and portacaval region. Reproductive: Uterus and bilateral adnexa are unremarkable. Other: No ascites. Musculoskeletal: Curvature of the spine. Scattered degenerative changes. There is severe compression of the L1 vertebral body with sclerosis. Please correlate with the recent lumbar spine MRI of 04/01/2022. IMPRESSION: Incidental lower lung bilateral pulmonary emboli. Increasing lung base opacities.  Breathing motion. Dilated gallbladder with stones appears also adjacent fluid. Please correlate for signs of acute cholecystitis  and if needed confirmatory study such as ultrasound and HIDA scan. Heterogeneous enhancement of both kidneys. There is no significant adjacent  stranding. Differential includes form of nephritis. Infectious, inflammatory. Please correlate with clinical presentation in any recent treatment for UTI. Colonic diverticulosis without obstruction. Moderate hiatal hernia. Critical Value/emergent results were called by telephone at the time of interpretation on 04/18/2022 at 12:17 pm to provider [facility] , who verbally acknowledged these results. Electronically Signed   By: [name] M.D.   On: 04/18/2022 15:33   DG Ankle Complete Right  Result Date: 04/06/2022 CLINICAL DATA:  Post fall from bed on 03/27/2022 with persistent lateral sided ankle pain. EXAM: RIGHT ANKLE - COMPLETE 3+ VIEW COMPARISON:  None Available. FINDINGS: There is an acute minimally displaced obliquely oriented fracture of the distal fibula with extension to involve the distal tib-fib joint. Joint spaces appear preserved. The ankle mortise appears preserved given obliquity. Small suspected ankle joint effusion. Minimal amount of adjacent soft tissue swelling. Dermal calcification overlies the anterior aspect of the lower leg. No radiopaque foreign body. Tiny plantar calcaneal spur. Enthesopathic change involving the Achilles tendon insertion site. IMPRESSION: Acute minimally displaced obliquely oriented fracture of the distal fibula with extension to the distal tib-fib joint. These results will be called to the ordering clinician or representative by the Radiologist Assistant, and communication documented in the PACS or [employer]. Electronically Signed   By: Sandi Mariscal M.D.   On: 04/06/2022 16:09   DG Chest 2 View  Result Date: 04/06/2022 CLINICAL DATA:  Left basilar lung crackles. EXAM: CHEST - 2 VIEW COMPARISON:  02/10/2022; CT abdomen and pelvis - 05/12/2021 FINDINGS: Grossly unchanged cardiac silhouette and mediastinal contours  with atherosclerotic plaque. Retrocardiac opacity compatible with known hiatal hernia. No focal airspace opacities. No pleural effusion or pneumothorax no evidence of edema. No acute osseous abnormalities. IMPRESSION: 1. No acute cardiopulmonary disease. Specifically, no evidence of pneumonia. 2. Retrocardiac opacity compatible with known hiatal hernia better demonstrated on abdominal CT performed 04/2021. Electronically Signed   By: Sandi Mariscal M.D.   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 Initial encounter EXAM: MRI LUMBAR SPINE WITHOUT CONTRAST TECHNIQUE: Multiplanar, multisequence MR imaging of the lumbar spine was performed. No intravenous contrast was administered. COMPARISON:  05/19/2020 FINDINGS: Segmentation:  5 lumbar type vertebrae Alignment:  Mild degenerative anterolisthesis at L4-5 Vertebrae: Interval compression fracture at L1 with advanced central height loss and minor posterosuperior corner retropulsion. Mild edematous appearance on STIR imaging but very hypointense appearance on T1 weighted imaging. No paravertebral mass is seen and no extension into the posterior elements. Conus medullaris and cauda equina: Conus extends to the T12 level. Conus and cauda equina appear normal. Paraspinal and other soft tissues: Negative for perispinal mass or inflammatory changes. Disc levels: T12- L1: Mild endplate ridging and facet spurring L1-L2: Mild disc bulging and facet spurring L2-L3: Disc bulging with broad central protrusion. Facet spurring and ligamentum flavum thickening. Triangular spinal canal narrowing with greater crowding at the right subarticular recess but no static compression L3-L4: Degenerative facet spurring with borderline anterolisthesis. Circumferential disc bulging. L4-L5: Degenerative facet spurring with anterolisthesis. The disc is bulging with annular fissure. No compressive stenosis L5-S1:Disc  narrowing and bulging. Degenerative facet spurring. No neural compression. IMPRESSION: 1. L1 compression fracture with advanced central height loss. There is notable hypointense marrow signal on T1 weighted imaging that is not explained by edema/acuity, this could be subacute timing with sclerosis but underlying lesion/pathologic fracture is a possibility. If expectant management is planned suggest follow-up MRI to follow the  marrow signal, especially if there is a history of malignancy. 2. Lumbar spine degeneration as described. Electronically Signed   By: Jorje Guild M.D.   On: 04/01/2022 12:18      Subjective: Denies complaints.  No dyspnea, chest pain or cough reported.  Discharge Exam:  Vitals:   04/19/22 2315 04/20/22 0309 04/20/22 0736 04/20/22 1053  BP: 118/68 126/69 116/62 101/87  Pulse: 82 76 73 66  Resp: '15 18 18 19  '$ Temp: 98.1 F (36.7 C) 98.1 F (36.7 C) 97.9 F (36.6 C) 98 F (36.7 C)  TempSrc: Oral Oral Oral Oral  SpO2: 94% 95% 96% 93%  Weight:  55.3 kg    Height:        General exam: Elderly female, moderately built and thinly nourished sitting up comfortably in bed without distress.   Respiratory system: Clear to auscultation.  No increased work of breathing. Cardiovascular system: S1 & S2 heard, RRR. No JVD, murmurs, rubs, gallops or clicks. No pedal edema.  Telemetry personally reviewed: Sinus rhythm.  Occasional mild sinus tachycardia in the 100s. Gastrointestinal system: Abdomen is nondistended, soft and nontender. No organomegaly or masses felt. Normal bowel sounds heard. Central nervous system: Alert and oriented. No focal neurological deficits. Extremities: Symmetric 5 x 5 power. Skin: No rashes, lesions or ulcers Psychiatry: Judgement and insight appear normal. Mood & affect appropriate.     The results of significant diagnostics from this hospitalization (including imaging, microbiology, ancillary and laboratory) are listed below for reference.      Microbiology: No results found for this or any previous visit (from the past 240 hour(s)).   Labs: CBC: Recent Labs  Lab 04/18/22 2130 04/19/22 0343 04/20/22 0127  WBC 7.6 6.7 6.5  NEUTROABS 5.1 5.9  --   HGB 13.1 13.6 12.6  HCT 40.3 43.1 39.9  MCV 92.2 92.9 92.6  PLT 221 297 [id number]    Basic Metabolic Panel: Recent Labs  Lab 04/18/22 1829 04/20/22 0127  NA 136 137  K 3.7 3.5  CL 91* 96*  CO2 31 35*  GLUCOSE 199* 130*  BUN 10 7*  CREATININE 0.51 0.65  CALCIUM 10.6* 9.7    Liver Function Tests: Recent Labs  Lab 04/18/22 1829  AST 35  ALT 21  ALKPHOS 98  BILITOT 0.6  PROT 6.9  ALBUMIN 2.5*    CBG: Recent Labs  Lab 04/19/22 1653 04/19/22 2115 04/20/22 0610 04/20/22 1127 04/20/22 1549  GLUCAP 120* 187* 138* 126* 320*   Discussed in detail with patient's son at bedside.  Time coordinating discharge: 35 minutes  SIGNED:  Vernell Leep, MD,  FACP, [place], [facility], [facility], [facility]   [facility]     To contact the attending provider between 7A-7P or the covering provider during after hours 7P-7A, please log into the web site [email or web address] and access using universal [place] password for that web site. If you do not have the password, please call the hospital operator.

## 2022-04-20 NOTE — Discharge Instructions (Addendum)
Information on my medicine - ELIQUIS (apixaban)  This medication education was reviewed with me or my healthcare representative as part of my discharge preparation.  The pharmacist that spoke with me during my hospital stay was:    Why was Eliquis prescribed for you? Eliquis was prescribed to treat blood clots that may have been found in the veins of your legs (deep vein thrombosis) or in your lungs (pulmonary embolism) and to reduce the risk of them occurring again.  What do You need to know about Eliquis ? The starting dose is 10 mg (two 5 mg tablets) taken TWICE daily for the FIRST SEVEN (7) DAYS, then on (enter date)  04/27/22 '@8pm'$   the dose is reduced to ONE 5 mg tablet taken TWICE daily.  Eliquis may be taken with or without food.   Try to take the dose about the same time in the morning and in the evening. If you have difficulty swallowing the tablet whole please discuss with your pharmacist how to take the medication safely.  Take Eliquis exactly as prescribed and DO NOT stop taking Eliquis without talking to the doctor who prescribed the medication.  Stopping may increase your risk of developing a new blood clot.  Refill your prescription before you run out.  After discharge, you should have regular check-up appointments with your healthcare provider that is prescribing your Eliquis.    What do you do if you miss a dose? If a dose of ELIQUIS is not taken at the scheduled time, take it as soon as possible on the same day and twice-daily administration should be resumed. The dose should not be doubled to make up for a missed dose.  Important Safety Information A possible side effect of Eliquis is bleeding. You should call your healthcare provider right away if you experience any of the following: Bleeding from an injury or your nose that does not stop. Unusual colored urine (red or dark brown) or unusual colored stools (red or black). Unusual bruising for unknown reasons. A  serious fall or if you hit your head (even if there is no bleeding).  Some medicines may interact with Eliquis and might increase your risk of bleeding or clotting while on Eliquis. To help avoid this, consult your healthcare provider or pharmacist prior to using any new prescription or non-prescription medications, including herbals, vitamins, non-steroidal anti-inflammatory drugs (NSAIDs) and supplements.  This website has more information on Eliquis (apixaban): http://[email or web address].com/eliquis/home   Additional Discharge Instructions   Please get your medications reviewed and adjusted by your Primary MD.  Please request your Primary MD to go over all Hospital Tests and Procedure/Radiological results at the follow up, please get all Hospital records sent to your Prim MD by signing hospital release before you go home.  If you had Pneumonia of Lung problems at the Hospital: Please get a 2 view Chest X ray done in approximately 4 weeks after hospital discharge or sooner if instructed by your Primary MD.  If you have Congestive Heart Failure: Please call your Cardiologist or Primary MD anytime you have any of the following symptoms:  1) 3 pound weight gain in 24 hours or 5 pounds in 1 week  2) shortness of breath, with or without a dry hacking cough  3) swelling in the hands, feet or stomach  4) if you have to sleep on extra pillows at night in order to breathe  Follow cardiac low salt diet and 1.5 lit/day fluid restriction.  If you have diabetes Accuchecks  4 times/day, Once in AM empty stomach and then before each meal. Log in all results and show them to your primary doctor at your next visit. If any glucose reading is under 80 or above 300 call your primary MD immediately.  If you have Seizure/Convulsions/Epilepsy: Please do not drive, operate heavy machinery, participate in activities at heights or participate in high speed sports until you have seen by Primary MD or a Neurologist  and advised to do so again. Per [facility] statutes, patients with seizures are not allowed to drive until they have been seizure-free for six months.  Use caution when using heavy equipment or power tools. Avoid working on ladders or at heights. Take showers instead of baths. Ensure the water temperature is not too high on the home water heater. Do not go swimming alone. Do not lock yourself in a room alone (i.e. bathroom). When caring for infants or small children, sit down when holding, feeding, or changing them to minimize risk of injury to the child in the event you have a seizure. Maintain good sleep hygiene. Avoid alcohol.   If you had Gastrointestinal Bleeding: Please ask your Primary MD to check a complete blood count within one week of discharge or at your next visit. Your endoscopic/colonoscopic biopsies that are pending at the time of discharge, will also need to followed by your Primary MD.  Get Medicines reviewed and adjusted. Please take all your medications with you for your next visit with your Primary MD  Please request your Primary MD to go over all hospital tests and procedure/radiological results at the follow up, please ask your Primary MD to get all Hospital records sent to his/her office.  If you experience worsening of your admission symptoms, develop shortness of breath, life threatening emergency, suicidal or homicidal thoughts you must seek medical attention immediately by calling 911 or calling your MD immediately  if symptoms less severe.  You must read complete instructions/literature along with all the possible adverse reactions/side effects for all the Medicines you take and that have been prescribed to you. Take any new Medicines after you have completely understood and accpet all the possible adverse reactions/side effects.   Do not drive or operate heavy machinery when taking Pain medications.   Do not take more than prescribed Pain, Sleep and Anxiety  Medications  Special Instructions: If you have smoked or chewed Tobacco  in the last 2 yrs please stop smoking, stop any regular Alcohol  and or any Recreational drug use.  Wear Seat belts while driving.  Please note You were cared for by a hospitalist during your hospital stay. If you have any questions about your discharge medications or the care you received while you were in the hospital after you are discharged, you can call the unit and asked to speak with the hospitalist on call if the hospitalist that took care of you is not available. Once you are discharged, your primary care physician will handle any further medical issues. Please note that NO REFILLS for any discharge medications will be authorized once you are discharged, as it is imperative that you return to your primary care physician (or establish a relationship with a primary care physician if you do not have one) for your aftercare needs so that they can reassess your need for medications and monitor your lab values.  You can reach the hospitalist office at phone [phone] or fax [phone]   If you do not have a primary care physician, you  can call [phone] for a physician referral.

## 2022-04-21 DIAGNOSIS — J9601 Acute respiratory failure with hypoxia: Secondary | ICD-10-CM | POA: Diagnosis not present

## 2022-04-22 DIAGNOSIS — J9601 Acute respiratory failure with hypoxia: Secondary | ICD-10-CM | POA: Diagnosis not present

## 2022-04-22 DIAGNOSIS — S32019D Unspecified fracture of first lumbar vertebra, subsequent encounter for fracture with routine healing: Secondary | ICD-10-CM | POA: Diagnosis not present

## 2022-04-22 DIAGNOSIS — J189 Pneumonia, unspecified organism: Secondary | ICD-10-CM | POA: Diagnosis not present

## 2022-04-22 DIAGNOSIS — G4733 Obstructive sleep apnea (adult) (pediatric): Secondary | ICD-10-CM | POA: Diagnosis not present

## 2022-04-22 DIAGNOSIS — I2510 Atherosclerotic heart disease of native coronary artery without angina pectoris: Secondary | ICD-10-CM | POA: Diagnosis not present

## 2022-04-22 DIAGNOSIS — J440 Chronic obstructive pulmonary disease with acute lower respiratory infection: Secondary | ICD-10-CM | POA: Diagnosis not present

## 2022-04-22 DIAGNOSIS — B9620 Unspecified Escherichia coli [E. coli] as the cause of diseases classified elsewhere: Secondary | ICD-10-CM | POA: Diagnosis not present

## 2022-04-22 DIAGNOSIS — H9193 Unspecified hearing loss, bilateral: Secondary | ICD-10-CM | POA: Diagnosis not present

## 2022-04-22 DIAGNOSIS — N390 Urinary tract infection, site not specified: Secondary | ICD-10-CM | POA: Diagnosis not present

## 2022-04-22 DIAGNOSIS — E1169 Type 2 diabetes mellitus with other specified complication: Secondary | ICD-10-CM | POA: Diagnosis not present

## 2022-04-22 DIAGNOSIS — I2699 Other pulmonary embolism without acute cor pulmonale: Secondary | ICD-10-CM | POA: Diagnosis not present

## 2022-04-22 DIAGNOSIS — K8020 Calculus of gallbladder without cholecystitis without obstruction: Secondary | ICD-10-CM | POA: Diagnosis not present

## 2022-04-22 DIAGNOSIS — I10 Essential (primary) hypertension: Secondary | ICD-10-CM | POA: Diagnosis not present

## 2022-04-22 DIAGNOSIS — E039 Hypothyroidism, unspecified: Secondary | ICD-10-CM | POA: Diagnosis not present

## 2022-04-22 DIAGNOSIS — E785 Hyperlipidemia, unspecified: Secondary | ICD-10-CM | POA: Diagnosis not present

## 2022-04-22 DIAGNOSIS — S82831D Other fracture of upper and lower end of right fibula, subsequent encounter for closed fracture with routine healing: Secondary | ICD-10-CM | POA: Diagnosis not present

## 2022-04-24 ENCOUNTER — Telehealth: Payer: Self-pay

## 2022-04-24 ENCOUNTER — Ambulatory Visit (INDEPENDENT_AMBULATORY_CARE_PROVIDER_SITE_OTHER): Payer: Medicare Other | Admitting: Family Medicine

## 2022-04-24 ENCOUNTER — Encounter: Payer: Self-pay | Admitting: Family Medicine

## 2022-04-24 VITALS — BP 120/72 | HR 62 | Temp 98.4°F | Ht 60.0 in | Wt 121.0 lb

## 2022-04-24 DIAGNOSIS — K8020 Calculus of gallbladder without cholecystitis without obstruction: Secondary | ICD-10-CM

## 2022-04-24 DIAGNOSIS — J9811 Atelectasis: Secondary | ICD-10-CM

## 2022-04-24 DIAGNOSIS — I10 Essential (primary) hypertension: Secondary | ICD-10-CM | POA: Diagnosis not present

## 2022-04-24 DIAGNOSIS — R7989 Other specified abnormal findings of blood chemistry: Secondary | ICD-10-CM

## 2022-04-24 DIAGNOSIS — N390 Urinary tract infection, site not specified: Secondary | ICD-10-CM | POA: Diagnosis not present

## 2022-04-24 DIAGNOSIS — E1169 Type 2 diabetes mellitus with other specified complication: Secondary | ICD-10-CM | POA: Diagnosis not present

## 2022-04-24 DIAGNOSIS — E785 Hyperlipidemia, unspecified: Secondary | ICD-10-CM | POA: Diagnosis not present

## 2022-04-24 DIAGNOSIS — S82831D Other fracture of upper and lower end of right fibula, subsequent encounter for closed fracture with routine healing: Secondary | ICD-10-CM | POA: Diagnosis not present

## 2022-04-24 DIAGNOSIS — J9601 Acute respiratory failure with hypoxia: Secondary | ICD-10-CM | POA: Diagnosis not present

## 2022-04-24 DIAGNOSIS — G4733 Obstructive sleep apnea (adult) (pediatric): Secondary | ICD-10-CM | POA: Diagnosis not present

## 2022-04-24 DIAGNOSIS — J189 Pneumonia, unspecified organism: Secondary | ICD-10-CM | POA: Diagnosis not present

## 2022-04-24 DIAGNOSIS — S32019D Unspecified fracture of first lumbar vertebra, subsequent encounter for fracture with routine healing: Secondary | ICD-10-CM | POA: Diagnosis not present

## 2022-04-24 DIAGNOSIS — E039 Hypothyroidism, unspecified: Secondary | ICD-10-CM | POA: Diagnosis not present

## 2022-04-24 DIAGNOSIS — B9620 Unspecified Escherichia coli [E. coli] as the cause of diseases classified elsewhere: Secondary | ICD-10-CM | POA: Diagnosis not present

## 2022-04-24 DIAGNOSIS — I2699 Other pulmonary embolism without acute cor pulmonale: Secondary | ICD-10-CM | POA: Diagnosis not present

## 2022-04-24 DIAGNOSIS — I2510 Atherosclerotic heart disease of native coronary artery without angina pectoris: Secondary | ICD-10-CM | POA: Diagnosis not present

## 2022-04-24 DIAGNOSIS — H9193 Unspecified hearing loss, bilateral: Secondary | ICD-10-CM | POA: Diagnosis not present

## 2022-04-24 DIAGNOSIS — J440 Chronic obstructive pulmonary disease with acute lower respiratory infection: Secondary | ICD-10-CM | POA: Diagnosis not present

## 2022-04-24 MED ORDER — AMOXICILLIN-POT CLAVULANATE 875-125 MG PO TABS: 1.0000 | ORAL_TABLET | Freq: Two times a day (BID) | ORAL | 0 refills | Status: DC

## 2022-04-24 MED ORDER — PROMETHAZINE-DM 6.25-15 MG/5ML PO SYRP: 2.5000 mL | ORAL_SOLUTION | Freq: Every evening | ORAL | 0 refills | Status: DC | PRN

## 2022-04-24 NOTE — Progress Notes (Signed)
Subjective:    Patient ID: April [name], female    DOB: [birth year], 82 y.o.   MRN: [id number]  Please see my previous office notes.  I treated the patient for pyelonephritis recently when she had presented acutely ill.  Urine culture confirmed E. coli pyelonephritis.  However her lab work was significant for elevated liver function test.  Repeat lab work showed an elevation of liver function test greater than 400.  This prompted me to obtain imaging of the abdomen and pelvis.  Coincidental findings on imaging of the abdomen and pelvis included bilateral pulmonary emboli, possible right lower lobe pneumonia, as well as gallstones and sludge in the gallbladder with pericholecystic fluid.  This raises the concern for possible cholecystitis coupled with bilateral pulmonary emboli.  Therefore refer the patient to the emergency room.  She was treated for pulmonary emboli with heparin and eventually transition to Eliquis.  Thankfully your liver function test normalized.  She been taking a large amount of Tylenol due to her L1 compression fracture and we suspect that the elevated liver function test were due to Tylenol.  She has not been having any right upper quadrant pain.  We suspect that the bilateral PE developed after she fractured her right ankle.  She is currently wearing a cam walker and is immobilized.  This is likely the source or potentially the patient developed DVTs while she has been battling COVID recently.  Therefore I feel that this was likely a provoked pulmonary emboli.  However also on her CAT scan with possible right-sided atelectasis versus it.  Even today she is still coughing.  There is rattling and congestion in her right lower lobe. Past Medical History:  Diagnosis Date   Abnormal weight gain    Acute diverticulitis 05/13/2021   Arthritis    "joints" (06/13/2013)   COPD (chronic obstructive pulmonary disease) (HCC)    Coronary artery disease, non-occlusive 06/13/2013   Cardiac Cath:  sharp Angle take-off of Large Dominant Cx: ~70-80% mid Cx bifurcation lesion (Lateral OM &  AVGCx-PL-PDA both with hairpin ostial & ~50% lesions) - Not PCI amenable due to vessel tortuosity; ~40-50% mid LAD;Ramus - no significnat diseaes; small non-dominant RCA   Diverticulosis    GERD (gastroesophageal reflux disease)    History of stress test    a. 09/2006 nl Dobutamine Echo   Hyperlipidemia    Hypertension    NIDDM (non-insulin dependent diabetes mellitus)    On home oxygen therapy    "2L q hs; runs into my BIPAP" (06/13/2013)   OSA (obstructive sleep apnea)    "BIPAP w/O2" (06/13/2013)   Tracheobronchomalacia    a. followed by [facility]   Past Surgical History:  Procedure Laterality Date   CARDIAC CATHETERIZATION  06/2013   Cardiac Cath: sharp Angle take-off of Large Dominant Cx: ~70-80% mid Cx bifurcation lesion (Lateral OM &  AVGCx-PL-PDA both with hairpin ostial & ~50% lesions) - Not PCI amenable due to vessel tortuosity; ~40-50% mid LAD;Ramus - no significnat diseaes; small non-dominant RCA   LEFT HEART CATHETERIZATION WITH CORONARY ANGIOGRAM N/A 06/16/2013   Procedure: LEFT HEART CATHETERIZATION WITH CORONARY ANGIOGRAM;  Surgeon: [name], MD;  Location[facility] CATH LAB;  Service: Cardiovascular;  Laterality: N/A;   TRANSTHORACIC ECHOCARDIOGRAM  06/17/2013   Normal LV size and function. EF 55-60% with no regional WMA. Grade 1 diastolic dysfunction. No significant valvular lesions   TUBAL LIGATION     Current Outpatient Medications on File Prior to Visit  Medication Sig  Dispense Refill   gabapentin (NEURONTIN) 600 MG tablet TAKE 1 TABLET BY MOUTH 4 TIMES DAILY 360 tablet 0   isosorbide mononitrate (IMDUR) 60 MG 24 hr tablet TAKE 1 TABLET BY MOUTH ONCE DAILY 90 tablet 3   LANTUS SOLOSTAR 100 UNIT/ML Solostar Pen INJECT 40-50 UNITS UNDER THE SKIN ONCE EVERY NIGHT AT BEDTIME. 15 mL 3   metFORMIN (GLUCOPHAGE) 500 MG tablet TAKE 1 TABLET BY MOUTH TWICE (2) DAILY WITH A MEAL 180 tablet  3   metoprolol tartrate (LOPRESSOR) 25 MG tablet TAKE 1 TABLET BY MOUTH TWICE A DAY 180 tablet 3   Multiple Vitamin (MULTIVITAMIN) capsule Take 1 capsule by mouth daily.     Omega-3 Fatty Acids (FISH OIL) 1000 MG CAPS Take 1 capsule by mouth daily.     omeprazole (PRILOSEC) 20 MG capsule Take 20 mg by mouth daily.     ondansetron (ZOFRAN-ODT) 4 MG disintegrating tablet Take 1 tablet (4 mg total) by mouth every 8 (eight) hours as needed for nausea or vomiting. 20 tablet 0   ONETOUCH VERIO test strip USE AS DIRECTED TO MONITOR BLOOD SUGARS UP TO 3 TIMES DAILY AS NEEDED 100 each 3   traMADol (ULTRAM) 50 MG tablet take 1 tablet by oral route at bedtime as needed for severe pain     albuterol (VENTOLIN HFA) 108 (90 Base) MCG/ACT inhaler Inhale 2 puffs into the lungs every 6 (six) hours as needed for wheezing or shortness of breath (Cough). (Patient not taking: Reported on 04/24/2022) 18 g 2   APIXABAN (ELIQUIS) VTE STARTER PACK ('10MG'$  AND '5MG'$ ) Take as directed on package: start with two-'5mg'$  tablets twice daily for 7 days. On day 8, switch to one-'5mg'$  tablet twice daily. 74 each 0   Calcium Carbonate-Vitamin D (CALCIUM 600 + D PO) Take 1 tablet by mouth daily. (Patient not taking: Reported on 04/24/2022)     escitalopram (LEXAPRO) 10 MG tablet Take 1 tablet (10 mg total) by mouth daily. (Patient not taking: Reported on 04/24/2022) 30 tablet 3   guaifenesin (HUMIBID E) 400 MG TABS tablet Take 1 tablet 3 times daily as needed for chest congestion and cough (Patient not taking: Reported on 04/24/2022) 21 tablet 0   potassium chloride (KLOR-CON) 10 MEQ tablet TAKE 1 TABLET BY MOUTH TWICE A DAY (Patient not taking: Reported on 04/24/2022) 60 tablet 0   rosuvastatin (CRESTOR) 40 MG tablet TAKE ONE TABLET BY MOUTH ONCE DAILY (Patient not taking: Reported on 04/24/2022) 90 tablet 3   torsemide (DEMADEX) 20 MG tablet TAKE 1 TABLET BY MOUTH ONCE DAILY (Patient not taking: Reported on 04/24/2022) 90 tablet 0   No current  facility-administered medications on file prior to visit.   Allergies  Allergen Reactions   Neomycin Other (See Comments)    Visual disturbance and swelling in eyes   Social History   Socioeconomic History   Marital status: Married    Spouse name: Not on file   Number of children: 3   Years of education: Not on file   Highest education level: Not on file  Occupational History   Occupation: Systems analyst  Tobacco Use   Smoking status: Former    Packs/day: 1.50    Years: 20.00    Total pack years: 30.00    Types: Cigarettes    Quit date: 02/13/1986    Years since quitting: 36.2   Smokeless tobacco: Never  Substance and Sexual Activity   Alcohol use: No   Drug use: No   Sexual activity:  Yes  Other Topics Concern   Not on file  Social History Narrative   Lives in [place] with her husband. 3 children.  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   Former 30 Pk/yr Smoker (1.5 PPD x 20 yr) - quit in 1998.   Social Determinants of Health   Financial Resource Strain: Low Risk  (07/09/2020)   Overall Financial Resource Strain (CARDIA)    Difficulty of Paying Living Expenses: Not hard at all  Food Insecurity: No Food Insecurity (04/19/2022)   Hunger Vital Sign    Worried About Running Out of Food in the Last Year: Never true    Ran Out of Food in the Last Year: Never true  Transportation Needs: No Transportation Needs (04/19/2022)   PRAPARE - Hydrologist (Medical): No    Lack of Transportation (Non-Medical): No  Physical Activity: Inactive (07/09/2020)   Exercise Vital Sign    Days of Exercise per Week: 0 days    Minutes of Exercise per Session: 0 min  Stress: No Stress Concern Present (07/09/2020)   [place]    Feeling of Stress : Not at all  Social Connections: Moderately Integrated (07/09/2020)   Social Connection and Isolation Panel [NHANES]    Frequency of Communication with Friends and  Family: More than three times a week    Frequency of Social Gatherings with Friends and Family: More than three times a week    Attends Religious Services: More than 4 times per year    Active Member of [employer] or Organizations: No    Attends Archivist Meetings: Never    Marital Status: Married  Human resources officer Violence: Not At Risk (04/19/2022)   Humiliation, Afraid, Rape, and Kick questionnaire    Fear of Current or Ex-Partner: No    Emotionally Abused: No    Physically Abused: No    Sexually Abused: No      Review of Systems  Musculoskeletal:  Positive for back pain.  All other systems reviewed and are negative.      Objective:   Physical Exam Constitutional:      General: She is not in acute distress.    Appearance: Normal appearance. She is obese. She is not ill-appearing, toxic-appearing or diaphoretic.  HENT:     Head: Normocephalic and atraumatic.     Right Ear: External ear normal. There is no impacted cerumen.     Left Ear: External ear normal. There is no impacted cerumen.     Nose: Nose normal. No congestion or rhinorrhea.     Mouth/Throat:     Mouth: Mucous membranes are dry.     Pharynx: Oropharynx is clear.  Eyes:     General: No scleral icterus.       Right eye: No discharge.        Left eye: No discharge.     Extraocular Movements: Extraocular movements intact.     Conjunctiva/sclera: Conjunctivae normal.     Pupils: Pupils are equal, round, and reactive to light.  Neck:     Vascular: No carotid bruit.  Cardiovascular:     Rate and Rhythm: Normal rate and regular rhythm.     Pulses: Normal pulses.     Heart sounds: Murmur heard.     No friction rub. No gallop.  Pulmonary:     Effort: Pulmonary effort is normal. No respiratory distress.     Breath sounds: No stridor. Rales present. No wheezing or  rhonchi.    Chest:     Chest wall: No tenderness.  Abdominal:     General: Abdomen is flat. Bowel sounds are normal. There is no distension.      Palpations: Abdomen is soft.     Tenderness: There is no abdominal tenderness. There is no right CVA tenderness, left CVA tenderness, guarding or rebound.     Hernia: No hernia is present.  Musculoskeletal:     Cervical back: Normal range of motion and neck supple. No rigidity.     Right lower leg: No edema.     Left lower leg: No edema.  Lymphadenopathy:     Cervical: No cervical adenopathy.  Skin:    Findings: No bruising, erythema, lesion or rash.  Neurological:     Mental Status: She is alert.     Gait: Gait abnormal.     Deep Tendon Reflexes: Reflexes normal.           Assessment & Plan:  Pulmonary embolism and infarction (HCC) - Plan: CBC with Differential/Platelet, COMPLETE METABOLIC PANEL WITH GFR  Atelectasis of right lung  Elevated LFTs  Calculus of gallbladder without cholecystitis without obstruction Patient continues to cough significantly.  I believe this is due to atelectasis.  I encouraged pulmonary toilet and recommended that they purchase a flutter valve to help with mucus clearance.  Also recommended Mucinex.  Only use Phenergan DM cough syrup at night to help her rest.  I would have a low threshold to start antibiotics to cover pneumonia if she develops fever or worsening shortness of breath.  Patient requires a hospital bed as she is immobilized with her fracture in her right leg.  She requires positioning not feasible in normal bed.  It would help to have the patient sit up greater than 30 degrees which would facilitate her breathing and help within feeding her in her hospital bed.  I believe the gallstones are asymptomatic.  Her liver function tests were likely due to Tylenol.  Repeat a CMP to ensure no elevated LFTs.  Will not remove the gallbladder at the present time given the situation with the pulmonary emboli unless she develops right upper quadrant pain.  We will treat the pulmonary emboli for 6 months with Eliquis.  Defer management of the leg  fracture to orthopedics

## 2022-04-24 NOTE — Telephone Encounter (Addendum)
April [name] w/[facility] needing Verbal orders OT 2x's a wk for 3weeks 1x's a wk for 3 weeks  Request to see if pt maybe qualified for hosptial bed w/rails to do help w/breathing/fall out of bed.  Giv 'ok' on behalf of pcp for orders.

## 2022-04-25 LAB — COMPLETE METABOLIC PANEL WITH GFR
AG Ratio: 1 (calc) (ref 1.0–2.5)
ALT: 27 U/L (ref 6–29)
AST: 37 U/L — ABNORMAL HIGH (ref 10–35)
Albumin: 3 g/dL — ABNORMAL LOW (ref 3.6–5.1)
Alkaline phosphatase (APISO): 71 U/L (ref 37–153)
BUN/Creatinine Ratio: 22 (calc) (ref 6–22)
BUN: 11 mg/dL (ref 7–25)
CO2: 34 mmol/L — ABNORMAL HIGH (ref 20–32)
Calcium: 9.6 mg/dL (ref 8.6–10.4)
Chloride: 99 mmol/L (ref 98–110)
Creat: 0.51 mg/dL — ABNORMAL LOW (ref 0.60–0.95)
Globulin: 2.9 g/dL (calc) (ref 1.9–3.7)
Glucose, Bld: 182 mg/dL — ABNORMAL HIGH (ref 65–99)
Potassium: 3.9 mmol/L (ref 3.5–5.3)
Sodium: 141 mmol/L (ref 135–146)
Total Bilirubin: 0.3 mg/dL (ref 0.2–1.2)
Total Protein: 5.9 g/dL — ABNORMAL LOW (ref 6.1–8.1)
eGFR: 94 mL/min/{1.73_m2} (ref >=60)

## 2022-04-25 LAB — CBC WITH DIFFERENTIAL/PLATELET
Absolute Monocytes: 573 cells/uL (ref 200–950)
Basophils Absolute: 18 cells/uL (ref 0–200)
Basophils Relative: 0.3 %
Eosinophils Absolute: 49 cells/uL (ref 15–500)
Eosinophils Relative: 0.8 %
HCT: 43.4 % (ref 35.0–45.0)
Hemoglobin: 14.1 g/dL (ref 11.7–15.5)
Lymphs Abs: 1586 cells/uL (ref 850–3900)
MCH: 29.1 pg (ref 27.0–33.0)
MCHC: 32.5 g/dL (ref 32.0–36.0)
MCV: 89.5 fL (ref 80.0–100.0)
MPV: 11 fL (ref 7.5–12.5)
Monocytes Relative: 9.4 %
Neutro Abs: 3874 cells/uL (ref 1500–7800)
Neutrophils Relative %: 63.5 %
Platelets: 281 10*3/uL (ref 140–400)
RBC: 4.85 10*6/uL (ref 3.80–5.10)
RDW: 15.1 % — ABNORMAL HIGH (ref 11.0–15.0)
Total Lymphocyte: 26 %
WBC: 6.1 10*3/uL (ref 3.8–10.8)

## 2022-04-26 DIAGNOSIS — N390 Urinary tract infection, site not specified: Secondary | ICD-10-CM | POA: Diagnosis not present

## 2022-04-26 DIAGNOSIS — E785 Hyperlipidemia, unspecified: Secondary | ICD-10-CM | POA: Diagnosis not present

## 2022-04-26 DIAGNOSIS — E1169 Type 2 diabetes mellitus with other specified complication: Secondary | ICD-10-CM | POA: Diagnosis not present

## 2022-04-26 DIAGNOSIS — I2699 Other pulmonary embolism without acute cor pulmonale: Secondary | ICD-10-CM | POA: Diagnosis not present

## 2022-04-26 DIAGNOSIS — E039 Hypothyroidism, unspecified: Secondary | ICD-10-CM | POA: Diagnosis not present

## 2022-04-26 DIAGNOSIS — G4733 Obstructive sleep apnea (adult) (pediatric): Secondary | ICD-10-CM | POA: Diagnosis not present

## 2022-04-26 DIAGNOSIS — H9193 Unspecified hearing loss, bilateral: Secondary | ICD-10-CM | POA: Diagnosis not present

## 2022-04-26 DIAGNOSIS — J440 Chronic obstructive pulmonary disease with acute lower respiratory infection: Secondary | ICD-10-CM | POA: Diagnosis not present

## 2022-04-26 DIAGNOSIS — B9620 Unspecified Escherichia coli [E. coli] as the cause of diseases classified elsewhere: Secondary | ICD-10-CM | POA: Diagnosis not present

## 2022-04-26 DIAGNOSIS — S82831D Other fracture of upper and lower end of right fibula, subsequent encounter for closed fracture with routine healing: Secondary | ICD-10-CM | POA: Diagnosis not present

## 2022-04-26 DIAGNOSIS — J189 Pneumonia, unspecified organism: Secondary | ICD-10-CM | POA: Diagnosis not present

## 2022-04-26 DIAGNOSIS — J9601 Acute respiratory failure with hypoxia: Secondary | ICD-10-CM | POA: Diagnosis not present

## 2022-04-26 DIAGNOSIS — S32019D Unspecified fracture of first lumbar vertebra, subsequent encounter for fracture with routine healing: Secondary | ICD-10-CM | POA: Diagnosis not present

## 2022-04-26 DIAGNOSIS — K8020 Calculus of gallbladder without cholecystitis without obstruction: Secondary | ICD-10-CM | POA: Diagnosis not present

## 2022-04-26 DIAGNOSIS — I10 Essential (primary) hypertension: Secondary | ICD-10-CM | POA: Diagnosis not present

## 2022-04-26 DIAGNOSIS — I2510 Atherosclerotic heart disease of native coronary artery without angina pectoris: Secondary | ICD-10-CM | POA: Diagnosis not present

## 2022-04-28 DIAGNOSIS — J189 Pneumonia, unspecified organism: Secondary | ICD-10-CM | POA: Diagnosis not present

## 2022-04-28 DIAGNOSIS — E785 Hyperlipidemia, unspecified: Secondary | ICD-10-CM | POA: Diagnosis not present

## 2022-04-28 DIAGNOSIS — S32019D Unspecified fracture of first lumbar vertebra, subsequent encounter for fracture with routine healing: Secondary | ICD-10-CM | POA: Diagnosis not present

## 2022-04-28 DIAGNOSIS — N390 Urinary tract infection, site not specified: Secondary | ICD-10-CM | POA: Diagnosis not present

## 2022-04-28 DIAGNOSIS — I2510 Atherosclerotic heart disease of native coronary artery without angina pectoris: Secondary | ICD-10-CM | POA: Diagnosis not present

## 2022-04-28 DIAGNOSIS — G4733 Obstructive sleep apnea (adult) (pediatric): Secondary | ICD-10-CM | POA: Diagnosis not present

## 2022-04-28 DIAGNOSIS — I10 Essential (primary) hypertension: Secondary | ICD-10-CM | POA: Diagnosis not present

## 2022-04-28 DIAGNOSIS — H9193 Unspecified hearing loss, bilateral: Secondary | ICD-10-CM | POA: Diagnosis not present

## 2022-04-28 DIAGNOSIS — J440 Chronic obstructive pulmonary disease with acute lower respiratory infection: Secondary | ICD-10-CM | POA: Diagnosis not present

## 2022-04-28 DIAGNOSIS — J9601 Acute respiratory failure with hypoxia: Secondary | ICD-10-CM | POA: Diagnosis not present

## 2022-04-28 DIAGNOSIS — I2699 Other pulmonary embolism without acute cor pulmonale: Secondary | ICD-10-CM | POA: Diagnosis not present

## 2022-04-28 DIAGNOSIS — S82831D Other fracture of upper and lower end of right fibula, subsequent encounter for closed fracture with routine healing: Secondary | ICD-10-CM | POA: Diagnosis not present

## 2022-04-28 DIAGNOSIS — E1169 Type 2 diabetes mellitus with other specified complication: Secondary | ICD-10-CM | POA: Diagnosis not present

## 2022-04-28 DIAGNOSIS — K8020 Calculus of gallbladder without cholecystitis without obstruction: Secondary | ICD-10-CM | POA: Diagnosis not present

## 2022-04-28 DIAGNOSIS — B9620 Unspecified Escherichia coli [E. coli] as the cause of diseases classified elsewhere: Secondary | ICD-10-CM | POA: Diagnosis not present

## 2022-04-28 DIAGNOSIS — E039 Hypothyroidism, unspecified: Secondary | ICD-10-CM | POA: Diagnosis not present

## 2022-05-01 ENCOUNTER — Telehealth: Payer: Self-pay

## 2022-05-01 DIAGNOSIS — J189 Pneumonia, unspecified organism: Secondary | ICD-10-CM | POA: Diagnosis not present

## 2022-05-01 DIAGNOSIS — I2510 Atherosclerotic heart disease of native coronary artery without angina pectoris: Secondary | ICD-10-CM | POA: Diagnosis not present

## 2022-05-01 DIAGNOSIS — I10 Essential (primary) hypertension: Secondary | ICD-10-CM | POA: Diagnosis not present

## 2022-05-01 DIAGNOSIS — J440 Chronic obstructive pulmonary disease with acute lower respiratory infection: Secondary | ICD-10-CM | POA: Diagnosis not present

## 2022-05-01 DIAGNOSIS — G4733 Obstructive sleep apnea (adult) (pediatric): Secondary | ICD-10-CM | POA: Diagnosis not present

## 2022-05-01 DIAGNOSIS — S82831D Other fracture of upper and lower end of right fibula, subsequent encounter for closed fracture with routine healing: Secondary | ICD-10-CM | POA: Diagnosis not present

## 2022-05-01 DIAGNOSIS — K8020 Calculus of gallbladder without cholecystitis without obstruction: Secondary | ICD-10-CM | POA: Diagnosis not present

## 2022-05-01 DIAGNOSIS — J9601 Acute respiratory failure with hypoxia: Secondary | ICD-10-CM | POA: Diagnosis not present

## 2022-05-01 DIAGNOSIS — W19XXXA Unspecified fall, initial encounter: Secondary | ICD-10-CM | POA: Insufficient documentation

## 2022-05-01 DIAGNOSIS — E039 Hypothyroidism, unspecified: Secondary | ICD-10-CM | POA: Diagnosis not present

## 2022-05-01 DIAGNOSIS — E785 Hyperlipidemia, unspecified: Secondary | ICD-10-CM | POA: Diagnosis not present

## 2022-05-01 DIAGNOSIS — S32019D Unspecified fracture of first lumbar vertebra, subsequent encounter for fracture with routine healing: Secondary | ICD-10-CM | POA: Diagnosis not present

## 2022-05-01 DIAGNOSIS — I2699 Other pulmonary embolism without acute cor pulmonale: Secondary | ICD-10-CM | POA: Diagnosis not present

## 2022-05-01 DIAGNOSIS — H9193 Unspecified hearing loss, bilateral: Secondary | ICD-10-CM | POA: Diagnosis not present

## 2022-05-01 DIAGNOSIS — N390 Urinary tract infection, site not specified: Secondary | ICD-10-CM | POA: Diagnosis not present

## 2022-05-01 DIAGNOSIS — E1169 Type 2 diabetes mellitus with other specified complication: Secondary | ICD-10-CM | POA: Diagnosis not present

## 2022-05-01 DIAGNOSIS — B9620 Unspecified Escherichia coli [E. coli] as the cause of diseases classified elsewhere: Secondary | ICD-10-CM | POA: Diagnosis not present

## 2022-05-01 NOTE — Telephone Encounter (Signed)
Nevin Bloodgood with [facility] called in stating that pt needs to have the order for the hospital resent to [facility] at fax number [phone]. ATTN: Ludwig Clarks  Please advise.  Cb#: [phone]

## 2022-05-02 ENCOUNTER — Other Ambulatory Visit: Payer: Self-pay | Admitting: Family Medicine

## 2022-05-02 DIAGNOSIS — S32019D Unspecified fracture of first lumbar vertebra, subsequent encounter for fracture with routine healing: Secondary | ICD-10-CM | POA: Diagnosis not present

## 2022-05-02 DIAGNOSIS — G4733 Obstructive sleep apnea (adult) (pediatric): Secondary | ICD-10-CM | POA: Diagnosis not present

## 2022-05-02 DIAGNOSIS — I10 Essential (primary) hypertension: Secondary | ICD-10-CM | POA: Diagnosis not present

## 2022-05-02 DIAGNOSIS — N390 Urinary tract infection, site not specified: Secondary | ICD-10-CM | POA: Diagnosis not present

## 2022-05-02 DIAGNOSIS — I2510 Atherosclerotic heart disease of native coronary artery without angina pectoris: Secondary | ICD-10-CM | POA: Diagnosis not present

## 2022-05-02 DIAGNOSIS — J440 Chronic obstructive pulmonary disease with acute lower respiratory infection: Secondary | ICD-10-CM | POA: Diagnosis not present

## 2022-05-02 DIAGNOSIS — E039 Hypothyroidism, unspecified: Secondary | ICD-10-CM | POA: Diagnosis not present

## 2022-05-02 DIAGNOSIS — E785 Hyperlipidemia, unspecified: Secondary | ICD-10-CM | POA: Diagnosis not present

## 2022-05-02 DIAGNOSIS — J189 Pneumonia, unspecified organism: Secondary | ICD-10-CM | POA: Diagnosis not present

## 2022-05-02 DIAGNOSIS — J9601 Acute respiratory failure with hypoxia: Secondary | ICD-10-CM | POA: Diagnosis not present

## 2022-05-02 DIAGNOSIS — K8020 Calculus of gallbladder without cholecystitis without obstruction: Secondary | ICD-10-CM | POA: Diagnosis not present

## 2022-05-02 DIAGNOSIS — S82831D Other fracture of upper and lower end of right fibula, subsequent encounter for closed fracture with routine healing: Secondary | ICD-10-CM | POA: Diagnosis not present

## 2022-05-02 DIAGNOSIS — H9193 Unspecified hearing loss, bilateral: Secondary | ICD-10-CM | POA: Diagnosis not present

## 2022-05-02 DIAGNOSIS — E1169 Type 2 diabetes mellitus with other specified complication: Secondary | ICD-10-CM | POA: Diagnosis not present

## 2022-05-02 DIAGNOSIS — I2699 Other pulmonary embolism without acute cor pulmonale: Secondary | ICD-10-CM | POA: Diagnosis not present

## 2022-05-02 DIAGNOSIS — B9620 Unspecified Escherichia coli [E. coli] as the cause of diseases classified elsewhere: Secondary | ICD-10-CM | POA: Diagnosis not present

## 2022-05-02 MED ORDER — POTASSIUM CHLORIDE ER 10 MEQ PO TBCR: 10.0000 meq | EXTENDED_RELEASE_TABLET | Freq: Two times a day (BID) | ORAL | 3 refills | Status: DC

## 2022-05-03 DIAGNOSIS — E039 Hypothyroidism, unspecified: Secondary | ICD-10-CM | POA: Diagnosis not present

## 2022-05-03 DIAGNOSIS — E785 Hyperlipidemia, unspecified: Secondary | ICD-10-CM | POA: Diagnosis not present

## 2022-05-03 DIAGNOSIS — H9193 Unspecified hearing loss, bilateral: Secondary | ICD-10-CM | POA: Diagnosis not present

## 2022-05-03 DIAGNOSIS — S82831D Other fracture of upper and lower end of right fibula, subsequent encounter for closed fracture with routine healing: Secondary | ICD-10-CM | POA: Diagnosis not present

## 2022-05-03 DIAGNOSIS — I2510 Atherosclerotic heart disease of native coronary artery without angina pectoris: Secondary | ICD-10-CM | POA: Diagnosis not present

## 2022-05-03 DIAGNOSIS — J440 Chronic obstructive pulmonary disease with acute lower respiratory infection: Secondary | ICD-10-CM | POA: Diagnosis not present

## 2022-05-03 DIAGNOSIS — B9620 Unspecified Escherichia coli [E. coli] as the cause of diseases classified elsewhere: Secondary | ICD-10-CM | POA: Diagnosis not present

## 2022-05-03 DIAGNOSIS — I2699 Other pulmonary embolism without acute cor pulmonale: Secondary | ICD-10-CM | POA: Diagnosis not present

## 2022-05-03 DIAGNOSIS — I10 Essential (primary) hypertension: Secondary | ICD-10-CM | POA: Diagnosis not present

## 2022-05-03 DIAGNOSIS — J189 Pneumonia, unspecified organism: Secondary | ICD-10-CM | POA: Diagnosis not present

## 2022-05-03 DIAGNOSIS — G4733 Obstructive sleep apnea (adult) (pediatric): Secondary | ICD-10-CM | POA: Diagnosis not present

## 2022-05-03 DIAGNOSIS — J9601 Acute respiratory failure with hypoxia: Secondary | ICD-10-CM | POA: Diagnosis not present

## 2022-05-03 DIAGNOSIS — E1169 Type 2 diabetes mellitus with other specified complication: Secondary | ICD-10-CM | POA: Diagnosis not present

## 2022-05-03 DIAGNOSIS — N390 Urinary tract infection, site not specified: Secondary | ICD-10-CM | POA: Diagnosis not present

## 2022-05-03 DIAGNOSIS — K8020 Calculus of gallbladder without cholecystitis without obstruction: Secondary | ICD-10-CM | POA: Diagnosis not present

## 2022-05-03 DIAGNOSIS — S32019D Unspecified fracture of first lumbar vertebra, subsequent encounter for fracture with routine healing: Secondary | ICD-10-CM | POA: Diagnosis not present

## 2022-05-04 DIAGNOSIS — E785 Hyperlipidemia, unspecified: Secondary | ICD-10-CM | POA: Diagnosis not present

## 2022-05-04 DIAGNOSIS — J9601 Acute respiratory failure with hypoxia: Secondary | ICD-10-CM | POA: Diagnosis not present

## 2022-05-04 DIAGNOSIS — I10 Essential (primary) hypertension: Secondary | ICD-10-CM | POA: Diagnosis not present

## 2022-05-04 DIAGNOSIS — B9620 Unspecified Escherichia coli [E. coli] as the cause of diseases classified elsewhere: Secondary | ICD-10-CM | POA: Diagnosis not present

## 2022-05-04 DIAGNOSIS — I2699 Other pulmonary embolism without acute cor pulmonale: Secondary | ICD-10-CM | POA: Diagnosis not present

## 2022-05-04 DIAGNOSIS — N390 Urinary tract infection, site not specified: Secondary | ICD-10-CM | POA: Diagnosis not present

## 2022-05-04 DIAGNOSIS — E1169 Type 2 diabetes mellitus with other specified complication: Secondary | ICD-10-CM | POA: Diagnosis not present

## 2022-05-04 DIAGNOSIS — E039 Hypothyroidism, unspecified: Secondary | ICD-10-CM | POA: Diagnosis not present

## 2022-05-04 DIAGNOSIS — J189 Pneumonia, unspecified organism: Secondary | ICD-10-CM | POA: Diagnosis not present

## 2022-05-04 DIAGNOSIS — H9193 Unspecified hearing loss, bilateral: Secondary | ICD-10-CM | POA: Diagnosis not present

## 2022-05-04 DIAGNOSIS — K8020 Calculus of gallbladder without cholecystitis without obstruction: Secondary | ICD-10-CM | POA: Diagnosis not present

## 2022-05-04 DIAGNOSIS — S32019D Unspecified fracture of first lumbar vertebra, subsequent encounter for fracture with routine healing: Secondary | ICD-10-CM | POA: Diagnosis not present

## 2022-05-04 DIAGNOSIS — I2510 Atherosclerotic heart disease of native coronary artery without angina pectoris: Secondary | ICD-10-CM | POA: Diagnosis not present

## 2022-05-04 DIAGNOSIS — G4733 Obstructive sleep apnea (adult) (pediatric): Secondary | ICD-10-CM | POA: Diagnosis not present

## 2022-05-04 DIAGNOSIS — S82831D Other fracture of upper and lower end of right fibula, subsequent encounter for closed fracture with routine healing: Secondary | ICD-10-CM | POA: Diagnosis not present

## 2022-05-04 DIAGNOSIS — J440 Chronic obstructive pulmonary disease with acute lower respiratory infection: Secondary | ICD-10-CM | POA: Diagnosis not present

## 2022-05-05 ENCOUNTER — Ambulatory Visit: Payer: Medicare Other | Admitting: Family Medicine

## 2022-05-05 DIAGNOSIS — M8008XD Age-related osteoporosis with current pathological fracture, vertebra(e), subsequent encounter for fracture with routine healing: Secondary | ICD-10-CM | POA: Diagnosis not present

## 2022-05-08 ENCOUNTER — Telehealth: Payer: Self-pay | Admitting: Family Medicine

## 2022-05-08 ENCOUNTER — Ambulatory Visit (INDEPENDENT_AMBULATORY_CARE_PROVIDER_SITE_OTHER): Payer: Medicare Other | Admitting: Family Medicine

## 2022-05-08 ENCOUNTER — Encounter: Payer: Self-pay | Admitting: Family Medicine

## 2022-05-08 VITALS — BP 144/78 | HR 74 | Temp 98.3°F | Ht 60.0 in | Wt 127.0 lb

## 2022-05-08 DIAGNOSIS — J440 Chronic obstructive pulmonary disease with acute lower respiratory infection: Secondary | ICD-10-CM | POA: Diagnosis not present

## 2022-05-08 DIAGNOSIS — J9601 Acute respiratory failure with hypoxia: Secondary | ICD-10-CM | POA: Diagnosis not present

## 2022-05-08 DIAGNOSIS — N390 Urinary tract infection, site not specified: Secondary | ICD-10-CM | POA: Diagnosis not present

## 2022-05-08 DIAGNOSIS — R7989 Other specified abnormal findings of blood chemistry: Secondary | ICD-10-CM | POA: Diagnosis not present

## 2022-05-08 DIAGNOSIS — I2510 Atherosclerotic heart disease of native coronary artery without angina pectoris: Secondary | ICD-10-CM | POA: Diagnosis not present

## 2022-05-08 DIAGNOSIS — E119 Type 2 diabetes mellitus without complications: Secondary | ICD-10-CM

## 2022-05-08 DIAGNOSIS — I2699 Other pulmonary embolism without acute cor pulmonale: Secondary | ICD-10-CM

## 2022-05-08 DIAGNOSIS — G4733 Obstructive sleep apnea (adult) (pediatric): Secondary | ICD-10-CM | POA: Diagnosis not present

## 2022-05-08 DIAGNOSIS — J189 Pneumonia, unspecified organism: Secondary | ICD-10-CM | POA: Diagnosis not present

## 2022-05-08 DIAGNOSIS — B9620 Unspecified Escherichia coli [E. coli] as the cause of diseases classified elsewhere: Secondary | ICD-10-CM | POA: Diagnosis not present

## 2022-05-08 DIAGNOSIS — E1169 Type 2 diabetes mellitus with other specified complication: Secondary | ICD-10-CM | POA: Diagnosis not present

## 2022-05-08 DIAGNOSIS — S32019D Unspecified fracture of first lumbar vertebra, subsequent encounter for fracture with routine healing: Secondary | ICD-10-CM | POA: Diagnosis not present

## 2022-05-08 DIAGNOSIS — E039 Hypothyroidism, unspecified: Secondary | ICD-10-CM | POA: Diagnosis not present

## 2022-05-08 DIAGNOSIS — H9193 Unspecified hearing loss, bilateral: Secondary | ICD-10-CM | POA: Diagnosis not present

## 2022-05-08 DIAGNOSIS — K8020 Calculus of gallbladder without cholecystitis without obstruction: Secondary | ICD-10-CM | POA: Diagnosis not present

## 2022-05-08 DIAGNOSIS — E785 Hyperlipidemia, unspecified: Secondary | ICD-10-CM | POA: Diagnosis not present

## 2022-05-08 DIAGNOSIS — I10 Essential (primary) hypertension: Secondary | ICD-10-CM | POA: Diagnosis not present

## 2022-05-08 DIAGNOSIS — S82831D Other fracture of upper and lower end of right fibula, subsequent encounter for closed fracture with routine healing: Secondary | ICD-10-CM | POA: Diagnosis not present

## 2022-05-08 NOTE — Telephone Encounter (Signed)
Received call from Mancel Parsons with [facility] to report the patient had a fall this morning while reaching into a lower cabinet. She fell backwards. No apparent injuries other than increased back pain.   Lattie Haw also wants to follow up on the request we made for a hospital bed; requesting call back with name of medical equipment company we use. She'd like to follow up on the request with them.   Please advise Lattie Haw with any additional questions at [phone]. Ok to leave a voicemail if she doesn't answer.

## 2022-05-08 NOTE — Progress Notes (Addendum)
Subjective:    Patient ID: April [name], female    DOB: April 28, 1940, 82 y.o.   MRN: [id number] 04/24/22 Please see my previous office notes.  I treated the patient for pyelonephritis recently when she had presented acutely ill.  Urine culture confirmed E. coli pyelonephritis.  However her lab work was significant for elevated liver function test.  Repeat lab work showed an elevation of liver function test greater than 400.  This prompted me to obtain imaging of the abdomen and pelvis.  Coincidental findings on imaging of the abdomen and pelvis included bilateral pulmonary emboli, possible right lower lobe pneumonia, as well as gallstones and sludge in the gallbladder with pericholecystic fluid.  This raises the concern for possible cholecystitis coupled with bilateral pulmonary emboli.  Therefore refer the patient to the emergency room.  She was treated for pulmonary emboli with heparin and eventually transition to Eliquis.  Thankfully your liver function test normalized.  She been taking a large amount of Tylenol due to her L1 compression fracture and we suspect that the elevated liver function test were due to Tylenol.  She has not been having any right upper quadrant pain.  We suspect that the bilateral PE developed after she fractured her right ankle.  She is currently wearing a cam walker and is immobilized.  This is likely the source or potentially the patient developed DVTs while she has been battling COVID recently.  Therefore I feel that this was likely a provoked pulmonary emboli.  However also on her CAT scan with possible right-sided atelectasis versus it.  Even today she is still coughing.  There is rattling and congestion in her right lower lobe. At that time, my plan was: Patient continues to cough significantly.  I believe this is due to atelectasis.  I encouraged pulmonary toilet and recommended that they purchase a flutter valve to help with mucus clearance.  Also recommended Mucinex.  Only  use Phenergan DM cough syrup at night to help her rest.  I would have a low threshold to start antibiotics to cover pneumonia if she develops fever or worsening shortness of breath.  Patient requires a hospital bed as she is immobilized with her fracture in her right leg.  She requires positioning not feasible in normal bed.  It would help to have the patient sit up greater than 30 degrees which would facilitate her breathing and help within feeding her in her hospital bed.  I believe the gallstones are asymptomatic.  Her liver function tests were likely due to Tylenol.  Repeat a CMP to ensure no elevated LFTs.  Will not remove the gallbladder at the present time given the situation with the pulmonary emboli unless she develops right upper quadrant pain.  We will treat the pulmonary emboli for 6 months with Eliquis.  Defer management of the leg fracture to orthopedics  05/08/22 Patient is here today to discuss restarting her medication.  When she was extremely sick with a urinary tract infection as well as a pulmonary emboli, we held her rosuvastatin due to elevated liver function test.  We held her torsemide secondary to dehydration.  We held her metformin due to the dehydration and the risk of lactic acidosis.  We also reduced her metoprolol to 1/2 tablet twice daily.  Now her blood pressure is slightly elevated.  She denies any chest pain or shortness of breath.  She is not fluid overloaded exam.  There are no pitting edema extremities and her lungs clear, the fibrotic  basilar crackles that I have appreciated on 3 separate instances secondary to atelectasis.  Her liver function tests have almost returned completely to normal after discontinuing high-dose Tylenol. Past Medical History:  Diagnosis Date   Abnormal weight gain    Acute diverticulitis 05/13/2021   Arthritis    "joints" (06/13/2013)   COPD (chronic obstructive pulmonary disease) (HCC)    Coronary artery disease, non-occlusive 06/13/2013    Cardiac Cath: sharp Angle take-off of Large Dominant Cx: ~70-80% mid Cx bifurcation lesion (Lateral OM &  AVGCx-PL-PDA both with hairpin ostial & ~50% lesions) - Not PCI amenable due to vessel tortuosity; ~40-50% mid LAD;Ramus - no significnat diseaes; small non-dominant RCA   Diverticulosis    GERD (gastroesophageal reflux disease)    History of stress test    a. 09/2006 nl Dobutamine Echo   Hyperlipidemia    Hypertension    NIDDM (non-insulin dependent diabetes mellitus)    On home oxygen therapy    "2L q hs; runs into my BIPAP" (06/13/2013)   OSA (obstructive sleep apnea)    "BIPAP w/O2" (06/13/2013)   Tracheobronchomalacia    a. followed by [facility]   Past Surgical History:  Procedure Laterality Date   CARDIAC CATHETERIZATION  06/2013   Cardiac Cath: sharp Angle take-off of Large Dominant Cx: ~70-80% mid Cx bifurcation lesion (Lateral OM &  AVGCx-PL-PDA both with hairpin ostial & ~50% lesions) - Not PCI amenable due to vessel tortuosity; ~40-50% mid LAD;Ramus - no significnat diseaes; small non-dominant RCA   LEFT HEART CATHETERIZATION WITH CORONARY ANGIOGRAM N/A 06/16/2013   Procedure: LEFT HEART CATHETERIZATION WITH CORONARY ANGIOGRAM;  Surgeon: [name], MD;  Location: [facility] CATH LAB;  Service: Cardiovascular;  Laterality: N/A;   TRANSTHORACIC ECHOCARDIOGRAM  06/17/2013   Normal LV size and function. EF 55-60% with no regional WMA. Grade 1 diastolic dysfunction. No significant valvular lesions   TUBAL LIGATION     Current Outpatient Medications on File Prior to Visit  Medication Sig Dispense Refill   APIXABAN (ELIQUIS) VTE STARTER PACK (10MG  AND 5MG ) Take as directed on package: start with two-5mg  tablets twice daily for 7 days. On day 8, switch to one-5mg  tablet twice daily. 74 each 0   isosorbide mononitrate (IMDUR) 60 MG 24 hr tablet TAKE 1 TABLET BY MOUTH ONCE DAILY 90 tablet 3   LANTUS SOLOSTAR 100 UNIT/ML Solostar Pen INJECT 40-50 UNITS UNDER THE SKIN ONCE EVERY NIGHT AT  BEDTIME. 15 mL 3   metoprolol tartrate (LOPRESSOR) 25 MG tablet TAKE 1 TABLET BY MOUTH TWICE A DAY 180 tablet 3   Multiple Vitamin (MULTIVITAMIN) capsule Take 1 capsule by mouth daily.     Omega-3 Fatty Acids (FISH OIL) 1000 MG CAPS Take 1 capsule by mouth daily.     omeprazole (PRILOSEC) 20 MG capsule Take 20 mg by mouth daily.     ondansetron (ZOFRAN-ODT) 4 MG disintegrating tablet Take 1 tablet (4 mg total) by mouth every 8 (eight) hours as needed for nausea or vomiting. 20 tablet 0   ONETOUCH VERIO test strip USE AS DIRECTED TO MONITOR BLOOD SUGARS UP TO 3 TIMES DAILY AS NEEDED 100 each 3   potassium chloride (KLOR-CON) 10 MEQ tablet TAKE ONE TABLET BY MOUTH TWICE A DAY 60 tablet 4   potassium chloride (KLOR-CON) 10 MEQ tablet Take 1 tablet (10 mEq total) by mouth 2 (two) times daily. 60 tablet 3   traMADol (ULTRAM) 50 MG tablet take 1 tablet by oral route at bedtime as needed for severe  pain     albuterol (VENTOLIN HFA) 108 (90 Base) MCG/ACT inhaler Inhale 2 puffs into the lungs every 6 (six) hours as needed for wheezing or shortness of breath (Cough). (Patient not taking: Reported on 04/24/2022) 18 g 2   Calcium Carbonate-Vitamin D (CALCIUM 600 + D PO) Take 1 tablet by mouth daily. (Patient not taking: Reported on 04/24/2022)     escitalopram (LEXAPRO) 10 MG tablet Take 1 tablet (10 mg total) by mouth daily. (Patient not taking: Reported on 04/24/2022) 30 tablet 3   gabapentin (NEURONTIN) 600 MG tablet TAKE 1 TABLET BY MOUTH 4 TIMES DAILY (Patient not taking: Reported on 05/08/2022) 360 tablet 0   guaifenesin (HUMIBID E) 400 MG TABS tablet Take 1 tablet 3 times daily as needed for chest congestion and cough (Patient not taking: Reported on 04/24/2022) 21 tablet 0   metFORMIN (GLUCOPHAGE) 500 MG tablet TAKE 1 TABLET BY MOUTH TWICE (2) DAILY WITH A MEAL (Patient not taking: Reported on 05/08/2022) 180 tablet 3   promethazine-dextromethorphan (PROMETHAZINE-DM) 6.25-15 MG/5ML syrup Take 2.5 mLs by mouth  at bedtime as needed for cough. (Patient not taking: Reported on 05/08/2022) 60 mL 0   rosuvastatin (CRESTOR) 40 MG tablet TAKE ONE TABLET BY MOUTH ONCE DAILY (Patient not taking: Reported on 04/24/2022) 90 tablet 3   torsemide (DEMADEX) 20 MG tablet TAKE 1 TABLET BY MOUTH ONCE DAILY (Patient not taking: Reported on 04/24/2022) 90 tablet 0   No current facility-administered medications on file prior to visit.   Allergies  Allergen Reactions   Neomycin Other (See Comments)    Visual disturbance and swelling in eyes   Social History   Socioeconomic History   Marital status: Married    Spouse name: Not on file   Number of children: 3   Years of education: Not on file   Highest education level: Not on file  Occupational History   Occupation: Systems analyst  Tobacco Use   Smoking status: Former    Packs/day: 1.50    Years: 20.00    Additional pack years: 0.00    Total pack years: 30.00    Types: Cigarettes    Quit date: 02/13/1986    Years since quitting: 36.2   Smokeless tobacco: Never  Substance and Sexual Activity   Alcohol use: No   Drug use: No   Sexual activity: Yes  Other Topics Concern   Not on file  Social History Narrative   Lives in [place] with her husband. 3 children.  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   Former 30 Pk/yr Smoker (1.5 PPD x 20 yr) - quit in 1998.   Social Determinants of Health   Financial Resource Strain: Low Risk  (07/09/2020)   Overall Financial Resource Strain (CARDIA)    Difficulty of Paying Living Expenses: Not hard at all  Food Insecurity: No Food Insecurity (04/19/2022)   Hunger Vital Sign    Worried About Running Out of Food in the Last Year: Never true    Ran Out of Food in the Last Year: Never true  Transportation Needs: No Transportation Needs (04/19/2022)   PRAPARE - Hydrologist (Medical): No    Lack of Transportation (Non-Medical): No  Physical Activity: Inactive (07/09/2020)   Exercise Vital Sign    Days of  Exercise per Week: 0 days    Minutes of Exercise per Session: 0 min  Stress: No Stress Concern Present (07/09/2020)   [place]  Feeling of Stress : Not at all  Social Connections: Moderately Integrated (07/09/2020)   Social Connection and Isolation Panel [NHANES]    Frequency of Communication with Friends and Family: More than three times a week    Frequency of Social Gatherings with Friends and Family: More than three times a week    Attends Religious Services: More than 4 times per year    Active Member of [employer] or Organizations: No    Attends Archivist Meetings: Never    Marital Status: Married  Human resources officer Violence: Not At Risk (04/19/2022)   Humiliation, Afraid, Rape, and Kick questionnaire    Fear of Current or Ex-Partner: No    Emotionally Abused: No    Physically Abused: No    Sexually Abused: No      Review of Systems  Musculoskeletal:  Positive for back pain.  All other systems reviewed and are negative.      Objective:   Physical Exam Constitutional:      General: She is not in acute distress.    Appearance: Normal appearance. She is obese. She is not ill-appearing, toxic-appearing or diaphoretic.  HENT:     Head: Normocephalic and atraumatic.     Right Ear: External ear normal. There is no impacted cerumen.     Left Ear: External ear normal. There is no impacted cerumen.     Nose: Nose normal. No congestion or rhinorrhea.     Mouth/Throat:     Mouth: Mucous membranes are dry.     Pharynx: Oropharynx is clear.  Eyes:     General: No scleral icterus.       Right eye: No discharge.        Left eye: No discharge.     Extraocular Movements: Extraocular movements intact.     Conjunctiva/sclera: Conjunctivae normal.     Pupils: Pupils are equal, round, and reactive to light.  Neck:     Vascular: No carotid bruit.  Cardiovascular:     Rate and Rhythm: Normal rate and regular  rhythm.     Pulses: Normal pulses.     Heart sounds: Murmur heard.     No friction rub. No gallop.  Pulmonary:     Effort: Pulmonary effort is normal. No respiratory distress.     Breath sounds: No stridor. Rales present. No wheezing or rhonchi.    Chest:     Chest wall: No tenderness.  Abdominal:     General: Abdomen is flat. Bowel sounds are normal. There is no distension.     Palpations: Abdomen is soft.     Tenderness: There is no abdominal tenderness. There is no right CVA tenderness, left CVA tenderness, guarding or rebound.     Hernia: No hernia is present.  Musculoskeletal:     Cervical back: Normal range of motion and neck supple. No rigidity.     Right lower leg: No edema.     Left lower leg: No edema.  Lymphadenopathy:     Cervical: No cervical adenopathy.  Skin:    Findings: No bruising, erythema, lesion or rash.  Neurological:     Mental Status: She is alert.     Gait: Gait abnormal.     Deep Tendon Reflexes: Reflexes normal.           Assessment & Plan:  Elevated LFTs  Type 2 diabetes mellitus treated without insulin ([place])  Pulmonary embolism and infarction ([place]) At this point I want the patient to resume metformin and gradually  uptitrate back to her previous dose of 500 mg twice daily.  I want her to resume her Crestor as her liver functions are normal.   Resume metoprolol 25 mg twice daily as her blood pressure is elevated.  Will continue to hold torsemide as the patient does not appear fluid overloaded.  I recommended that they take the torsemide daily as needed if she gains 3 or more pounds in 24 hours or if they see pitting edema in her extremities.  As stated above, patient requires a hospital bed as she is immobilized with her fracture in her right leg.  She requires positioning not feasible in normal bed.  It would help to have the patient sit up greater than 30 degrees which would facilitate her breathing and help within feeding her in her hospital  bed.  She is too weak to sit up by herself and maintain her positioning independently.  She would also need frequent repositioning to prevent bed sores from immobility.  She needs an electric bed because she is unable to help reposition herself due to weakness and her caregivers are not strong enpugh to pull her up in the bed independently.

## 2022-05-09 DIAGNOSIS — E1169 Type 2 diabetes mellitus with other specified complication: Secondary | ICD-10-CM | POA: Diagnosis not present

## 2022-05-09 DIAGNOSIS — H9193 Unspecified hearing loss, bilateral: Secondary | ICD-10-CM | POA: Diagnosis not present

## 2022-05-09 DIAGNOSIS — J189 Pneumonia, unspecified organism: Secondary | ICD-10-CM | POA: Diagnosis not present

## 2022-05-09 DIAGNOSIS — I10 Essential (primary) hypertension: Secondary | ICD-10-CM | POA: Diagnosis not present

## 2022-05-09 DIAGNOSIS — K8020 Calculus of gallbladder without cholecystitis without obstruction: Secondary | ICD-10-CM | POA: Diagnosis not present

## 2022-05-09 DIAGNOSIS — S32019D Unspecified fracture of first lumbar vertebra, subsequent encounter for fracture with routine healing: Secondary | ICD-10-CM | POA: Diagnosis not present

## 2022-05-09 DIAGNOSIS — J9601 Acute respiratory failure with hypoxia: Secondary | ICD-10-CM | POA: Diagnosis not present

## 2022-05-09 DIAGNOSIS — N390 Urinary tract infection, site not specified: Secondary | ICD-10-CM | POA: Diagnosis not present

## 2022-05-09 DIAGNOSIS — E039 Hypothyroidism, unspecified: Secondary | ICD-10-CM | POA: Diagnosis not present

## 2022-05-09 DIAGNOSIS — I2699 Other pulmonary embolism without acute cor pulmonale: Secondary | ICD-10-CM | POA: Diagnosis not present

## 2022-05-09 DIAGNOSIS — I2510 Atherosclerotic heart disease of native coronary artery without angina pectoris: Secondary | ICD-10-CM | POA: Diagnosis not present

## 2022-05-09 DIAGNOSIS — J440 Chronic obstructive pulmonary disease with acute lower respiratory infection: Secondary | ICD-10-CM | POA: Diagnosis not present

## 2022-05-09 DIAGNOSIS — B9620 Unspecified Escherichia coli [E. coli] as the cause of diseases classified elsewhere: Secondary | ICD-10-CM | POA: Diagnosis not present

## 2022-05-09 DIAGNOSIS — G4733 Obstructive sleep apnea (adult) (pediatric): Secondary | ICD-10-CM | POA: Diagnosis not present

## 2022-05-09 DIAGNOSIS — E785 Hyperlipidemia, unspecified: Secondary | ICD-10-CM | POA: Diagnosis not present

## 2022-05-09 DIAGNOSIS — S82831D Other fracture of upper and lower end of right fibula, subsequent encounter for closed fracture with routine healing: Secondary | ICD-10-CM | POA: Diagnosis not present

## 2022-05-10 DIAGNOSIS — H9193 Unspecified hearing loss, bilateral: Secondary | ICD-10-CM | POA: Diagnosis not present

## 2022-05-10 DIAGNOSIS — E039 Hypothyroidism, unspecified: Secondary | ICD-10-CM | POA: Diagnosis not present

## 2022-05-10 DIAGNOSIS — G4733 Obstructive sleep apnea (adult) (pediatric): Secondary | ICD-10-CM | POA: Diagnosis not present

## 2022-05-10 DIAGNOSIS — E1169 Type 2 diabetes mellitus with other specified complication: Secondary | ICD-10-CM | POA: Diagnosis not present

## 2022-05-10 DIAGNOSIS — J9601 Acute respiratory failure with hypoxia: Secondary | ICD-10-CM | POA: Diagnosis not present

## 2022-05-10 DIAGNOSIS — K8020 Calculus of gallbladder without cholecystitis without obstruction: Secondary | ICD-10-CM | POA: Diagnosis not present

## 2022-05-10 DIAGNOSIS — I2699 Other pulmonary embolism without acute cor pulmonale: Secondary | ICD-10-CM | POA: Diagnosis not present

## 2022-05-10 DIAGNOSIS — S82831D Other fracture of upper and lower end of right fibula, subsequent encounter for closed fracture with routine healing: Secondary | ICD-10-CM | POA: Diagnosis not present

## 2022-05-10 DIAGNOSIS — J189 Pneumonia, unspecified organism: Secondary | ICD-10-CM | POA: Diagnosis not present

## 2022-05-10 DIAGNOSIS — I10 Essential (primary) hypertension: Secondary | ICD-10-CM | POA: Diagnosis not present

## 2022-05-10 DIAGNOSIS — E785 Hyperlipidemia, unspecified: Secondary | ICD-10-CM | POA: Diagnosis not present

## 2022-05-10 DIAGNOSIS — I2510 Atherosclerotic heart disease of native coronary artery without angina pectoris: Secondary | ICD-10-CM | POA: Diagnosis not present

## 2022-05-10 DIAGNOSIS — N390 Urinary tract infection, site not specified: Secondary | ICD-10-CM | POA: Diagnosis not present

## 2022-05-10 DIAGNOSIS — J440 Chronic obstructive pulmonary disease with acute lower respiratory infection: Secondary | ICD-10-CM | POA: Diagnosis not present

## 2022-05-10 DIAGNOSIS — B9620 Unspecified Escherichia coli [E. coli] as the cause of diseases classified elsewhere: Secondary | ICD-10-CM | POA: Diagnosis not present

## 2022-05-10 DIAGNOSIS — S32019D Unspecified fracture of first lumbar vertebra, subsequent encounter for fracture with routine healing: Secondary | ICD-10-CM | POA: Diagnosis not present

## 2022-05-11 DIAGNOSIS — J440 Chronic obstructive pulmonary disease with acute lower respiratory infection: Secondary | ICD-10-CM | POA: Diagnosis not present

## 2022-05-11 DIAGNOSIS — I2699 Other pulmonary embolism without acute cor pulmonale: Secondary | ICD-10-CM | POA: Diagnosis not present

## 2022-05-11 DIAGNOSIS — B9620 Unspecified Escherichia coli [E. coli] as the cause of diseases classified elsewhere: Secondary | ICD-10-CM | POA: Diagnosis not present

## 2022-05-11 DIAGNOSIS — K8020 Calculus of gallbladder without cholecystitis without obstruction: Secondary | ICD-10-CM | POA: Diagnosis not present

## 2022-05-11 DIAGNOSIS — E785 Hyperlipidemia, unspecified: Secondary | ICD-10-CM | POA: Diagnosis not present

## 2022-05-11 DIAGNOSIS — N390 Urinary tract infection, site not specified: Secondary | ICD-10-CM | POA: Diagnosis not present

## 2022-05-11 DIAGNOSIS — E1169 Type 2 diabetes mellitus with other specified complication: Secondary | ICD-10-CM | POA: Diagnosis not present

## 2022-05-11 DIAGNOSIS — J189 Pneumonia, unspecified organism: Secondary | ICD-10-CM | POA: Diagnosis not present

## 2022-05-11 DIAGNOSIS — S32019D Unspecified fracture of first lumbar vertebra, subsequent encounter for fracture with routine healing: Secondary | ICD-10-CM | POA: Diagnosis not present

## 2022-05-11 DIAGNOSIS — J9601 Acute respiratory failure with hypoxia: Secondary | ICD-10-CM | POA: Diagnosis not present

## 2022-05-11 DIAGNOSIS — I2510 Atherosclerotic heart disease of native coronary artery without angina pectoris: Secondary | ICD-10-CM | POA: Diagnosis not present

## 2022-05-11 DIAGNOSIS — E039 Hypothyroidism, unspecified: Secondary | ICD-10-CM | POA: Diagnosis not present

## 2022-05-11 DIAGNOSIS — S82831D Other fracture of upper and lower end of right fibula, subsequent encounter for closed fracture with routine healing: Secondary | ICD-10-CM | POA: Diagnosis not present

## 2022-05-11 DIAGNOSIS — H9193 Unspecified hearing loss, bilateral: Secondary | ICD-10-CM | POA: Diagnosis not present

## 2022-05-11 DIAGNOSIS — G4733 Obstructive sleep apnea (adult) (pediatric): Secondary | ICD-10-CM | POA: Diagnosis not present

## 2022-05-11 DIAGNOSIS — I10 Essential (primary) hypertension: Secondary | ICD-10-CM | POA: Diagnosis not present

## 2022-05-15 ENCOUNTER — Other Ambulatory Visit (HOSPITAL_COMMUNITY): Payer: Self-pay

## 2022-05-15 DIAGNOSIS — B9620 Unspecified Escherichia coli [E. coli] as the cause of diseases classified elsewhere: Secondary | ICD-10-CM | POA: Diagnosis not present

## 2022-05-15 DIAGNOSIS — J189 Pneumonia, unspecified organism: Secondary | ICD-10-CM | POA: Diagnosis not present

## 2022-05-15 DIAGNOSIS — E1169 Type 2 diabetes mellitus with other specified complication: Secondary | ICD-10-CM | POA: Diagnosis not present

## 2022-05-15 DIAGNOSIS — K8020 Calculus of gallbladder without cholecystitis without obstruction: Secondary | ICD-10-CM | POA: Diagnosis not present

## 2022-05-15 DIAGNOSIS — S82831D Other fracture of upper and lower end of right fibula, subsequent encounter for closed fracture with routine healing: Secondary | ICD-10-CM | POA: Diagnosis not present

## 2022-05-15 DIAGNOSIS — J9601 Acute respiratory failure with hypoxia: Secondary | ICD-10-CM | POA: Diagnosis not present

## 2022-05-15 DIAGNOSIS — I10 Essential (primary) hypertension: Secondary | ICD-10-CM | POA: Diagnosis not present

## 2022-05-15 DIAGNOSIS — H9193 Unspecified hearing loss, bilateral: Secondary | ICD-10-CM | POA: Diagnosis not present

## 2022-05-15 DIAGNOSIS — J440 Chronic obstructive pulmonary disease with acute lower respiratory infection: Secondary | ICD-10-CM | POA: Diagnosis not present

## 2022-05-15 DIAGNOSIS — N390 Urinary tract infection, site not specified: Secondary | ICD-10-CM | POA: Diagnosis not present

## 2022-05-15 DIAGNOSIS — G4733 Obstructive sleep apnea (adult) (pediatric): Secondary | ICD-10-CM | POA: Diagnosis not present

## 2022-05-15 DIAGNOSIS — I2510 Atherosclerotic heart disease of native coronary artery without angina pectoris: Secondary | ICD-10-CM | POA: Diagnosis not present

## 2022-05-15 DIAGNOSIS — E785 Hyperlipidemia, unspecified: Secondary | ICD-10-CM | POA: Diagnosis not present

## 2022-05-15 DIAGNOSIS — E039 Hypothyroidism, unspecified: Secondary | ICD-10-CM | POA: Diagnosis not present

## 2022-05-15 DIAGNOSIS — S32019D Unspecified fracture of first lumbar vertebra, subsequent encounter for fracture with routine healing: Secondary | ICD-10-CM | POA: Diagnosis not present

## 2022-05-15 DIAGNOSIS — I2699 Other pulmonary embolism without acute cor pulmonale: Secondary | ICD-10-CM | POA: Diagnosis not present

## 2022-05-16 ENCOUNTER — Telehealth: Payer: Self-pay

## 2022-05-16 ENCOUNTER — Telehealth: Payer: Self-pay | Admitting: Family Medicine

## 2022-05-16 DIAGNOSIS — S82831D Other fracture of upper and lower end of right fibula, subsequent encounter for closed fracture with routine healing: Secondary | ICD-10-CM | POA: Diagnosis not present

## 2022-05-16 DIAGNOSIS — B9620 Unspecified Escherichia coli [E. coli] as the cause of diseases classified elsewhere: Secondary | ICD-10-CM | POA: Diagnosis not present

## 2022-05-16 DIAGNOSIS — J189 Pneumonia, unspecified organism: Secondary | ICD-10-CM | POA: Diagnosis not present

## 2022-05-16 DIAGNOSIS — E039 Hypothyroidism, unspecified: Secondary | ICD-10-CM | POA: Diagnosis not present

## 2022-05-16 DIAGNOSIS — J440 Chronic obstructive pulmonary disease with acute lower respiratory infection: Secondary | ICD-10-CM | POA: Diagnosis not present

## 2022-05-16 DIAGNOSIS — K8020 Calculus of gallbladder without cholecystitis without obstruction: Secondary | ICD-10-CM | POA: Diagnosis not present

## 2022-05-16 DIAGNOSIS — H9193 Unspecified hearing loss, bilateral: Secondary | ICD-10-CM | POA: Diagnosis not present

## 2022-05-16 DIAGNOSIS — J9601 Acute respiratory failure with hypoxia: Secondary | ICD-10-CM | POA: Diagnosis not present

## 2022-05-16 DIAGNOSIS — E785 Hyperlipidemia, unspecified: Secondary | ICD-10-CM | POA: Diagnosis not present

## 2022-05-16 DIAGNOSIS — I10 Essential (primary) hypertension: Secondary | ICD-10-CM | POA: Diagnosis not present

## 2022-05-16 DIAGNOSIS — I2510 Atherosclerotic heart disease of native coronary artery without angina pectoris: Secondary | ICD-10-CM | POA: Diagnosis not present

## 2022-05-16 DIAGNOSIS — I2699 Other pulmonary embolism without acute cor pulmonale: Secondary | ICD-10-CM | POA: Diagnosis not present

## 2022-05-16 DIAGNOSIS — S32019D Unspecified fracture of first lumbar vertebra, subsequent encounter for fracture with routine healing: Secondary | ICD-10-CM | POA: Diagnosis not present

## 2022-05-16 DIAGNOSIS — E1169 Type 2 diabetes mellitus with other specified complication: Secondary | ICD-10-CM | POA: Diagnosis not present

## 2022-05-16 DIAGNOSIS — G4733 Obstructive sleep apnea (adult) (pediatric): Secondary | ICD-10-CM | POA: Diagnosis not present

## 2022-05-16 DIAGNOSIS — N390 Urinary tract infection, site not specified: Secondary | ICD-10-CM | POA: Diagnosis not present

## 2022-05-16 NOTE — Telephone Encounter (Signed)
Called patient to schedule Medicare Annual Wellness Visit (AWV). Left message for patient to call back and schedule Medicare Annual Wellness Visit (AWV).  Last date of AWV: 07/09/2020   Please schedule an appointment at any time with Loma Sousa, [facility] .  If any questions, please contact me at [phone].  Thank you,  Colletta Maryland,  [place] [facility] Direct Dial ??[phone]

## 2022-05-16 NOTE — Telephone Encounter (Signed)
Pt's daughter called in stating that pt has been having some severe headaches for the past few day. Pt's daughter just wanted to speak with the nurse about some of the meds possibly causing these headaches, and if she should take pt off of some of these meds? Please advise.  Cb#: Rei Drummonds [phone]

## 2022-05-18 ENCOUNTER — Encounter: Payer: Self-pay | Admitting: Family Medicine

## 2022-05-18 ENCOUNTER — Ambulatory Visit (INDEPENDENT_AMBULATORY_CARE_PROVIDER_SITE_OTHER): Payer: Medicare Other | Admitting: Family Medicine

## 2022-05-18 ENCOUNTER — Telehealth: Payer: Self-pay | Admitting: Family Medicine

## 2022-05-18 ENCOUNTER — Other Ambulatory Visit (HOSPITAL_COMMUNITY): Payer: Self-pay

## 2022-05-18 ENCOUNTER — Other Ambulatory Visit: Payer: Self-pay

## 2022-05-18 VITALS — BP 126/64 | HR 83 | Temp 97.5°F | Ht 60.0 in | Wt 126.0 lb

## 2022-05-18 DIAGNOSIS — R519 Headache, unspecified: Secondary | ICD-10-CM | POA: Diagnosis not present

## 2022-05-18 DIAGNOSIS — I2699 Other pulmonary embolism without acute cor pulmonale: Secondary | ICD-10-CM

## 2022-05-18 DIAGNOSIS — I5189 Other ill-defined heart diseases: Secondary | ICD-10-CM

## 2022-05-18 DIAGNOSIS — I200 Unstable angina: Secondary | ICD-10-CM

## 2022-05-18 MED ORDER — APIXABAN 5 MG PO TABS: 5.0000 mg | ORAL_TABLET | Freq: Two times a day (BID) | ORAL | 1 refills | Status: DC

## 2022-05-18 MED ORDER — PREDNISONE 20 MG PO TABS: ORAL_TABLET | ORAL | 0 refills | Status: DC

## 2022-05-18 MED ORDER — TRAMADOL HCL 50 MG PO TABS: ORAL_TABLET | ORAL | 0 refills | Status: DC

## 2022-05-18 NOTE — Progress Notes (Signed)
Subjective:    Patient ID: April [name], female    DOB: 1941/02/11, 82 y.o.   MRN: [id number] 04/24/22 Please see my previous office notes.  I treated the patient for pyelonephritis recently when she had presented acutely ill.  Urine culture confirmed E. coli pyelonephritis.  However her lab work was significant for elevated liver function test.  Repeat lab work showed an elevation of liver function test greater than 400.  This prompted me to obtain imaging of the abdomen and pelvis.  Coincidental findings on imaging of the abdomen and pelvis included bilateral pulmonary emboli, possible right lower lobe pneumonia, as well as gallstones and sludge in the gallbladder with pericholecystic fluid.  This raised the concern for possible cholecystitis coupled with bilateral pulmonary emboli.  Therefore I referred the patient to the emergency room.  She was treated for pulmonary emboli with heparin and eventually transition to Eliquis.  Thankfully her liver function test had normalized prior to ER arrival.  She had been taking a large amount of Tylenol due to her L1 compression fracture and we suspect that the elevated liver function test were due to Tylenol.  She has not been having any right upper quadrant pain.  We suspect that the bilateral PE developed after she fractured her right ankle.  She is currently wearing a cam walker and is immobilized.  This is likely the source or potentially the patient developed DVTs while she has been battling COVID recently.  Therefore I feel that this was likely a provoked pulmonary emboli.  At that time, my plan was: Patient continues to cough significantly.  I believe this is due to atelectasis.  I encouraged pulmonary toilet and recommended that they purchase a flutter valve to help with mucus clearance.  Also recommended Mucinex.  Only use Phenergan DM cough syrup at night to help her rest.  I would have a low threshold to start antibiotics to cover pneumonia if she  develops fever or worsening shortness of breath.  Patient requires a hospital bed as she is immobilized with her fracture in her right leg.  She requires positioning not feasible in normal bed.  It would help to have the patient sit up greater than 30 degrees which would facilitate her breathing and help within feeding her in her hospital bed.  I believe the gallstones are asymptomatic.  Her liver function tests were likely due to Tylenol.  Repeat a CMP to ensure no elevated LFTs.  Will not remove the gallbladder at the present time given the situation with the pulmonary emboli unless she develops right upper quadrant pain.  We will treat the pulmonary emboli for 6 months with Eliquis.  Defer management of the leg fracture to orthopedics.  At this point I want the patient to resume metformin and gradually uptitrate back to her previous dose of 500 mg twice daily.  I want her to resume her Crestor as her liver functions are normal.  Resume metoprolol 25 mg twice daily as her blood pressure is elevated.  Will continue to hold torsemide as the patient does not appear fluid overloaded.  I recommended that they take the torsemide daily as needed if she gains 3 or more pounds in 24 hours or if they see pitting edema in her extremities.   05/18/22 Patient reports a 1 week history of a headache.  It is isolated to the left side of her head.  It seems to originate her left occiput and radiate up into her left temple  over her left parietal lobe.  It sharp and intense.  She does have some tenderness to palpation in her left side of her neck and on the left occiput.  There are no vesicles or rash to suggest any shingles.  She denies any pain with range of motion in her neck.  The headache is pulsatile.  There is no neurologic deficit.  Cranial nerves II through XII are grossly intact and muscle strength is 5/5 and equal and symmetric in the upper and lower extremities.  She denies any unilateral lacrimation or rhinorrhea to  suggest a cluster headache or hemicrania continua.  She has no history of migraines.  She denies any falls where she hit her head or anything that would make [place] worry about intercranial bleed.  This seems to be more neuropathic in nature.  There are no strokelike symptoms and there is no neurologic deficit.  There is no altered mental status  Past Medical History:  Diagnosis Date   Abnormal weight gain    Acute diverticulitis 05/13/2021   Arthritis    "joints" (06/13/2013)   COPD (chronic obstructive pulmonary disease) (HCC)    Coronary artery disease, non-occlusive 06/13/2013   Cardiac Cath: sharp Angle take-off of Large Dominant Cx: ~70-80% mid Cx bifurcation lesion (Lateral OM &  AVGCx-PL-PDA both with hairpin ostial & ~50% lesions) - Not PCI amenable due to vessel tortuosity; ~40-50% mid LAD;Ramus - no significnat diseaes; small non-dominant RCA   Diverticulosis    GERD (gastroesophageal reflux disease)    History of stress test    a. 09/2006 nl Dobutamine Echo   Hyperlipidemia    Hypertension    NIDDM (non-insulin dependent diabetes mellitus)    On home oxygen therapy    "2L q hs; runs into my BIPAP" (06/13/2013)   OSA (obstructive sleep apnea)    "BIPAP w/O2" (06/13/2013)   Tracheobronchomalacia    a. followed by [facility]   Past Surgical History:  Procedure Laterality Date   CARDIAC CATHETERIZATION  06/2013   Cardiac Cath: sharp Angle take-off of Large Dominant Cx: ~70-80% mid Cx bifurcation lesion (Lateral OM &  AVGCx-PL-PDA both with hairpin ostial & ~50% lesions) - Not PCI amenable due to vessel tortuosity; ~40-50% mid LAD;Ramus - no significnat diseaes; small non-dominant RCA   LEFT HEART CATHETERIZATION WITH CORONARY ANGIOGRAM N/A 06/16/2013   Procedure: LEFT HEART CATHETERIZATION WITH CORONARY ANGIOGRAM;  Surgeon: [name], MD;  Location: [facility] CATH LAB;  Service: Cardiovascular;  Laterality: N/A;   TRANSTHORACIC ECHOCARDIOGRAM  06/17/2013   Normal LV size and function. EF  55-60% with no regional WMA. Grade 1 diastolic dysfunction. No significant valvular lesions   TUBAL LIGATION     Current Outpatient Medications on File Prior to Visit  Medication Sig Dispense Refill   albuterol (VENTOLIN HFA) 108 (90 Base) MCG/ACT inhaler Inhale 2 puffs into the lungs every 6 (six) hours as needed for wheezing or shortness of breath (Cough). (Patient not taking: Reported on 04/24/2022) 18 g 2   APIXABAN (ELIQUIS) VTE STARTER PACK (10MG  AND 5MG ) Take as directed on package: start with two-5mg  tablets twice daily for 7 days. On day 8, switch to one-5mg  tablet twice daily. 74 each 0   Calcium Carbonate-Vitamin D (CALCIUM 600 + D PO) Take 1 tablet by mouth daily. (Patient not taking: Reported on 04/24/2022)     escitalopram (LEXAPRO) 10 MG tablet Take 1 tablet (10 mg total) by mouth daily. (Patient not taking: Reported on 04/24/2022) 30 tablet 3  gabapentin (NEURONTIN) 600 MG tablet TAKE 1 TABLET BY MOUTH 4 TIMES DAILY (Patient not taking: Reported on 05/08/2022) 360 tablet 0   guaifenesin (HUMIBID E) 400 MG TABS tablet Take 1 tablet 3 times daily as needed for chest congestion and cough (Patient not taking: Reported on 04/24/2022) 21 tablet 0   isosorbide mononitrate (IMDUR) 60 MG 24 hr tablet TAKE 1 TABLET BY MOUTH ONCE DAILY 90 tablet 3   LANTUS SOLOSTAR 100 UNIT/ML Solostar Pen INJECT 40-50 UNITS UNDER THE SKIN ONCE EVERY NIGHT AT BEDTIME. 15 mL 3   metFORMIN (GLUCOPHAGE) 500 MG tablet TAKE 1 TABLET BY MOUTH TWICE (2) DAILY WITH A MEAL (Patient not taking: Reported on 05/08/2022) 180 tablet 3   metoprolol tartrate (LOPRESSOR) 25 MG tablet TAKE 1 TABLET BY MOUTH TWICE A DAY 180 tablet 3   Multiple Vitamin (MULTIVITAMIN) capsule Take 1 capsule by mouth daily.     Omega-3 Fatty Acids (FISH OIL) 1000 MG CAPS Take 1 capsule by mouth daily.     omeprazole (PRILOSEC) 20 MG capsule Take 20 mg by mouth daily.     ondansetron (ZOFRAN-ODT) 4 MG disintegrating tablet Take 1 tablet (4 mg total) by  mouth every 8 (eight) hours as needed for nausea or vomiting. 20 tablet 0   ONETOUCH VERIO test strip USE AS DIRECTED TO MONITOR BLOOD SUGARS UP TO 3 TIMES DAILY AS NEEDED 100 each 3   potassium chloride (KLOR-CON) 10 MEQ tablet TAKE ONE TABLET BY MOUTH TWICE A DAY 60 tablet 4   potassium chloride (KLOR-CON) 10 MEQ tablet Take 1 tablet (10 mEq total) by mouth 2 (two) times daily. 60 tablet 3   promethazine-dextromethorphan (PROMETHAZINE-DM) 6.25-15 MG/5ML syrup Take 2.5 mLs by mouth at bedtime as needed for cough. (Patient not taking: Reported on 05/08/2022) 60 mL 0   rosuvastatin (CRESTOR) 40 MG tablet TAKE ONE TABLET BY MOUTH ONCE DAILY (Patient not taking: Reported on 04/24/2022) 90 tablet 3   torsemide (DEMADEX) 20 MG tablet TAKE 1 TABLET BY MOUTH ONCE DAILY (Patient not taking: Reported on 04/24/2022) 90 tablet 0   traMADol (ULTRAM) 50 MG tablet take 1 tablet by oral route at bedtime as needed for severe pain     No current facility-administered medications on file prior to visit.   Allergies  Allergen Reactions   Neomycin Other (See Comments)    Visual disturbance and swelling in eyes   Social History   Socioeconomic History   Marital status: Married    Spouse name: Not on file   Number of children: 3   Years of education: Not on file   Highest education level: Not on file  Occupational History   Occupation: Systems analyst  Tobacco Use   Smoking status: Former    Packs/day: 1.50    Years: 20.00    Additional pack years: 0.00    Total pack years: 30.00    Types: Cigarettes    Quit date: 02/13/1986    Years since quitting: 36.2   Smokeless tobacco: Never  Substance and Sexual Activity   Alcohol use: No   Drug use: No   Sexual activity: Yes  Other Topics Concern   Not on file  Social History Narrative   Lives in [place] with her husband. 3 children.  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   Former 30 Pk/yr Smoker (1.5 PPD x 20 yr) - quit in 1998.   Social Determinants of Health    Financial Resource Strain: Low Risk  (07/09/2020)   Overall Financial Resource  Strain (CARDIA)    Difficulty of Paying Living Expenses: Not hard at all  Food Insecurity: No Food Insecurity (04/19/2022)   Hunger Vital Sign    Worried About Running Out of Food in the Last Year: Never true    Ran Out of Food in the Last Year: Never true  Transportation Needs: No Transportation Needs (04/19/2022)   PRAPARE - Hydrologist (Medical): No    Lack of Transportation (Non-Medical): No  Physical Activity: Inactive (07/09/2020)   Exercise Vital Sign    Days of Exercise per Week: 0 days    Minutes of Exercise per Session: 0 min  Stress: No Stress Concern Present (07/09/2020)   [place]    Feeling of Stress : Not at all  Social Connections: Moderately Integrated (07/09/2020)   Social Connection and Isolation Panel [NHANES]    Frequency of Communication with Friends and Family: More than three times a week    Frequency of Social Gatherings with Friends and Family: More than three times a week    Attends Religious Services: More than 4 times per year    Active Member of [employer] or Organizations: No    Attends Archivist Meetings: Never    Marital Status: Married  Human resources officer Violence: Not At Risk (04/19/2022)   Humiliation, Afraid, Rape, and Kick questionnaire    Fear of Current or Ex-Partner: No    Emotionally Abused: No    Physically Abused: No    Sexually Abused: No      Review of Systems  Musculoskeletal:  Positive for back pain.  All other systems reviewed and are negative.      Objective:   Physical Exam Constitutional:      General: She is not in acute distress.    Appearance: Normal appearance. She is obese. She is not ill-appearing, toxic-appearing or diaphoretic.  HENT:     Head: Normocephalic and atraumatic.     Right Ear: External ear normal. There is no impacted cerumen.      Left Ear: External ear normal. There is no impacted cerumen.     Nose: Nose normal. No congestion or rhinorrhea.     Mouth/Throat:     Mouth: Mucous membranes are dry.     Pharynx: Oropharynx is clear.  Eyes:     General: No scleral icterus.       Right eye: No discharge.        Left eye: No discharge.     Extraocular Movements: Extraocular movements intact.     Conjunctiva/sclera: Conjunctivae normal.     Pupils: Pupils are equal, round, and reactive to light.  Neck:     Vascular: No carotid bruit.  Cardiovascular:     Rate and Rhythm: Normal rate and regular rhythm.     Pulses: Normal pulses.     Heart sounds: Murmur heard.     No friction rub. No gallop.  Pulmonary:     Effort: Pulmonary effort is normal. No respiratory distress.     Breath sounds: No stridor. Rales present. No wheezing or rhonchi.    Chest:     Chest wall: No tenderness.  Abdominal:     General: Abdomen is flat. Bowel sounds are normal. There is no distension.     Palpations: Abdomen is soft.     Tenderness: There is no abdominal tenderness. There is no right CVA tenderness, left CVA tenderness, guarding or rebound.  Hernia: No hernia is present.  Musculoskeletal:     Cervical back: Normal range of motion and neck supple. No rigidity.     Right lower leg: No edema.     Left lower leg: No edema.  Lymphadenopathy:     Cervical: No cervical adenopathy.  Skin:    Findings: No bruising, erythema, lesion or rash.  Neurological:     Mental Status: She is alert.     Gait: Gait abnormal.     Deep Tendon Reflexes: Reflexes normal.           Assessment & Plan:  Unilateral occipital headache Based on her history and her normal physical exam, this seems to be a neuropathic headache possibly related to arthritis in her neck.  I doubt any type of vascular headache given how good she looks today.  Begin prednisone for possible nerve impingement in the neck.  Reassess in 1 week.  Proceed with  neuroimaging if headache worsens or if the patient develops any neurologic symptoms.  Seek medical attention immediately if she develops any strokelike symptoms or worsening headache..  Physical exam today shows no evidence of temporal arteritis

## 2022-05-18 NOTE — Telephone Encounter (Signed)
Prescription Request  05/18/2022  LOV: 05/18/2022  What is the name of the medication or equipment?   APIXABAN (ELIQUIS) VTE STARTER PACK (10MG  AND 5MG )   Have you contacted your pharmacy to request a refill? Yes   Which pharmacy would you like this sent to?   [facility] [place] [address] [place], [place] [address] Phone: [phone]  Fax: [phone]   **REQUESTED FOR SCRIPT TO BE SENT TO LOCAL PHARMACY**  Patient notified that their request is being sent to the clinical staff for review and that they should receive a response within 2 business days.   Please advise daughter Bryson Ha when refill sent in at [phone].

## 2022-05-19 DIAGNOSIS — S82831D Other fracture of upper and lower end of right fibula, subsequent encounter for closed fracture with routine healing: Secondary | ICD-10-CM | POA: Diagnosis not present

## 2022-05-19 DIAGNOSIS — E785 Hyperlipidemia, unspecified: Secondary | ICD-10-CM | POA: Diagnosis not present

## 2022-05-19 DIAGNOSIS — G4733 Obstructive sleep apnea (adult) (pediatric): Secondary | ICD-10-CM | POA: Diagnosis not present

## 2022-05-19 DIAGNOSIS — E039 Hypothyroidism, unspecified: Secondary | ICD-10-CM | POA: Diagnosis not present

## 2022-05-19 DIAGNOSIS — J9601 Acute respiratory failure with hypoxia: Secondary | ICD-10-CM | POA: Diagnosis not present

## 2022-05-19 DIAGNOSIS — S32019D Unspecified fracture of first lumbar vertebra, subsequent encounter for fracture with routine healing: Secondary | ICD-10-CM | POA: Diagnosis not present

## 2022-05-19 DIAGNOSIS — J189 Pneumonia, unspecified organism: Secondary | ICD-10-CM | POA: Diagnosis not present

## 2022-05-19 DIAGNOSIS — I2699 Other pulmonary embolism without acute cor pulmonale: Secondary | ICD-10-CM | POA: Diagnosis not present

## 2022-05-19 DIAGNOSIS — E1169 Type 2 diabetes mellitus with other specified complication: Secondary | ICD-10-CM | POA: Diagnosis not present

## 2022-05-19 DIAGNOSIS — K8020 Calculus of gallbladder without cholecystitis without obstruction: Secondary | ICD-10-CM | POA: Diagnosis not present

## 2022-05-19 DIAGNOSIS — N390 Urinary tract infection, site not specified: Secondary | ICD-10-CM | POA: Diagnosis not present

## 2022-05-19 DIAGNOSIS — J440 Chronic obstructive pulmonary disease with acute lower respiratory infection: Secondary | ICD-10-CM | POA: Diagnosis not present

## 2022-05-19 DIAGNOSIS — H9193 Unspecified hearing loss, bilateral: Secondary | ICD-10-CM | POA: Diagnosis not present

## 2022-05-19 DIAGNOSIS — I10 Essential (primary) hypertension: Secondary | ICD-10-CM | POA: Diagnosis not present

## 2022-05-19 DIAGNOSIS — I2510 Atherosclerotic heart disease of native coronary artery without angina pectoris: Secondary | ICD-10-CM | POA: Diagnosis not present

## 2022-05-19 DIAGNOSIS — B9620 Unspecified Escherichia coli [E. coli] as the cause of diseases classified elsewhere: Secondary | ICD-10-CM | POA: Diagnosis not present

## 2022-05-22 ENCOUNTER — Other Ambulatory Visit: Payer: Self-pay | Admitting: Cardiology

## 2022-05-22 DIAGNOSIS — H9193 Unspecified hearing loss, bilateral: Secondary | ICD-10-CM | POA: Diagnosis not present

## 2022-05-22 DIAGNOSIS — S32019D Unspecified fracture of first lumbar vertebra, subsequent encounter for fracture with routine healing: Secondary | ICD-10-CM | POA: Diagnosis not present

## 2022-05-22 DIAGNOSIS — G4733 Obstructive sleep apnea (adult) (pediatric): Secondary | ICD-10-CM | POA: Diagnosis not present

## 2022-05-22 DIAGNOSIS — E039 Hypothyroidism, unspecified: Secondary | ICD-10-CM | POA: Diagnosis not present

## 2022-05-22 DIAGNOSIS — J189 Pneumonia, unspecified organism: Secondary | ICD-10-CM | POA: Diagnosis not present

## 2022-05-22 DIAGNOSIS — J440 Chronic obstructive pulmonary disease with acute lower respiratory infection: Secondary | ICD-10-CM | POA: Diagnosis not present

## 2022-05-22 DIAGNOSIS — I10 Essential (primary) hypertension: Secondary | ICD-10-CM | POA: Diagnosis not present

## 2022-05-22 DIAGNOSIS — S82831D Other fracture of upper and lower end of right fibula, subsequent encounter for closed fracture with routine healing: Secondary | ICD-10-CM | POA: Diagnosis not present

## 2022-05-22 DIAGNOSIS — N390 Urinary tract infection, site not specified: Secondary | ICD-10-CM | POA: Diagnosis not present

## 2022-05-22 DIAGNOSIS — E1169 Type 2 diabetes mellitus with other specified complication: Secondary | ICD-10-CM | POA: Diagnosis not present

## 2022-05-22 DIAGNOSIS — E785 Hyperlipidemia, unspecified: Secondary | ICD-10-CM | POA: Diagnosis not present

## 2022-05-22 DIAGNOSIS — I2699 Other pulmonary embolism without acute cor pulmonale: Secondary | ICD-10-CM | POA: Diagnosis not present

## 2022-05-22 DIAGNOSIS — J9601 Acute respiratory failure with hypoxia: Secondary | ICD-10-CM | POA: Diagnosis not present

## 2022-05-22 DIAGNOSIS — K8020 Calculus of gallbladder without cholecystitis without obstruction: Secondary | ICD-10-CM | POA: Diagnosis not present

## 2022-05-22 DIAGNOSIS — B9620 Unspecified Escherichia coli [E. coli] as the cause of diseases classified elsewhere: Secondary | ICD-10-CM | POA: Diagnosis not present

## 2022-05-22 DIAGNOSIS — I2510 Atherosclerotic heart disease of native coronary artery without angina pectoris: Secondary | ICD-10-CM | POA: Diagnosis not present

## 2022-05-23 DIAGNOSIS — E1169 Type 2 diabetes mellitus with other specified complication: Secondary | ICD-10-CM | POA: Diagnosis not present

## 2022-05-23 DIAGNOSIS — K8020 Calculus of gallbladder without cholecystitis without obstruction: Secondary | ICD-10-CM | POA: Diagnosis not present

## 2022-05-23 DIAGNOSIS — J9601 Acute respiratory failure with hypoxia: Secondary | ICD-10-CM | POA: Diagnosis not present

## 2022-05-23 DIAGNOSIS — I10 Essential (primary) hypertension: Secondary | ICD-10-CM | POA: Diagnosis not present

## 2022-05-23 DIAGNOSIS — E785 Hyperlipidemia, unspecified: Secondary | ICD-10-CM | POA: Diagnosis not present

## 2022-05-23 DIAGNOSIS — G4733 Obstructive sleep apnea (adult) (pediatric): Secondary | ICD-10-CM | POA: Diagnosis not present

## 2022-05-23 DIAGNOSIS — H9193 Unspecified hearing loss, bilateral: Secondary | ICD-10-CM | POA: Diagnosis not present

## 2022-05-23 DIAGNOSIS — B9620 Unspecified Escherichia coli [E. coli] as the cause of diseases classified elsewhere: Secondary | ICD-10-CM | POA: Diagnosis not present

## 2022-05-23 DIAGNOSIS — I2699 Other pulmonary embolism without acute cor pulmonale: Secondary | ICD-10-CM | POA: Diagnosis not present

## 2022-05-23 DIAGNOSIS — S82831D Other fracture of upper and lower end of right fibula, subsequent encounter for closed fracture with routine healing: Secondary | ICD-10-CM | POA: Diagnosis not present

## 2022-05-23 DIAGNOSIS — S32019D Unspecified fracture of first lumbar vertebra, subsequent encounter for fracture with routine healing: Secondary | ICD-10-CM | POA: Diagnosis not present

## 2022-05-23 DIAGNOSIS — J189 Pneumonia, unspecified organism: Secondary | ICD-10-CM | POA: Diagnosis not present

## 2022-05-23 DIAGNOSIS — N390 Urinary tract infection, site not specified: Secondary | ICD-10-CM | POA: Diagnosis not present

## 2022-05-23 DIAGNOSIS — I2510 Atherosclerotic heart disease of native coronary artery without angina pectoris: Secondary | ICD-10-CM | POA: Diagnosis not present

## 2022-05-23 DIAGNOSIS — J440 Chronic obstructive pulmonary disease with acute lower respiratory infection: Secondary | ICD-10-CM | POA: Diagnosis not present

## 2022-05-23 DIAGNOSIS — E039 Hypothyroidism, unspecified: Secondary | ICD-10-CM | POA: Diagnosis not present

## 2022-05-24 DIAGNOSIS — I10 Essential (primary) hypertension: Secondary | ICD-10-CM | POA: Diagnosis not present

## 2022-05-24 DIAGNOSIS — K8020 Calculus of gallbladder without cholecystitis without obstruction: Secondary | ICD-10-CM | POA: Diagnosis not present

## 2022-05-24 DIAGNOSIS — N390 Urinary tract infection, site not specified: Secondary | ICD-10-CM | POA: Diagnosis not present

## 2022-05-24 DIAGNOSIS — E039 Hypothyroidism, unspecified: Secondary | ICD-10-CM | POA: Diagnosis not present

## 2022-05-24 DIAGNOSIS — J189 Pneumonia, unspecified organism: Secondary | ICD-10-CM | POA: Diagnosis not present

## 2022-05-24 DIAGNOSIS — G4733 Obstructive sleep apnea (adult) (pediatric): Secondary | ICD-10-CM | POA: Diagnosis not present

## 2022-05-24 DIAGNOSIS — J440 Chronic obstructive pulmonary disease with acute lower respiratory infection: Secondary | ICD-10-CM | POA: Diagnosis not present

## 2022-05-24 DIAGNOSIS — I2699 Other pulmonary embolism without acute cor pulmonale: Secondary | ICD-10-CM | POA: Diagnosis not present

## 2022-05-24 DIAGNOSIS — J9601 Acute respiratory failure with hypoxia: Secondary | ICD-10-CM | POA: Diagnosis not present

## 2022-05-24 DIAGNOSIS — H9193 Unspecified hearing loss, bilateral: Secondary | ICD-10-CM | POA: Diagnosis not present

## 2022-05-24 DIAGNOSIS — S32019D Unspecified fracture of first lumbar vertebra, subsequent encounter for fracture with routine healing: Secondary | ICD-10-CM | POA: Diagnosis not present

## 2022-05-24 DIAGNOSIS — E785 Hyperlipidemia, unspecified: Secondary | ICD-10-CM | POA: Diagnosis not present

## 2022-05-24 DIAGNOSIS — B9620 Unspecified Escherichia coli [E. coli] as the cause of diseases classified elsewhere: Secondary | ICD-10-CM | POA: Diagnosis not present

## 2022-05-24 DIAGNOSIS — S82831D Other fracture of upper and lower end of right fibula, subsequent encounter for closed fracture with routine healing: Secondary | ICD-10-CM | POA: Diagnosis not present

## 2022-05-24 DIAGNOSIS — E1169 Type 2 diabetes mellitus with other specified complication: Secondary | ICD-10-CM | POA: Diagnosis not present

## 2022-05-24 DIAGNOSIS — I2510 Atherosclerotic heart disease of native coronary artery without angina pectoris: Secondary | ICD-10-CM | POA: Diagnosis not present

## 2022-05-31 ENCOUNTER — Telehealth: Payer: Self-pay | Admitting: Family Medicine

## 2022-05-31 DIAGNOSIS — I2699 Other pulmonary embolism without acute cor pulmonale: Secondary | ICD-10-CM | POA: Diagnosis not present

## 2022-05-31 DIAGNOSIS — J189 Pneumonia, unspecified organism: Secondary | ICD-10-CM | POA: Diagnosis not present

## 2022-05-31 DIAGNOSIS — J440 Chronic obstructive pulmonary disease with acute lower respiratory infection: Secondary | ICD-10-CM | POA: Diagnosis not present

## 2022-05-31 DIAGNOSIS — I10 Essential (primary) hypertension: Secondary | ICD-10-CM | POA: Diagnosis not present

## 2022-05-31 DIAGNOSIS — E785 Hyperlipidemia, unspecified: Secondary | ICD-10-CM | POA: Diagnosis not present

## 2022-05-31 DIAGNOSIS — E039 Hypothyroidism, unspecified: Secondary | ICD-10-CM | POA: Diagnosis not present

## 2022-05-31 DIAGNOSIS — S82831D Other fracture of upper and lower end of right fibula, subsequent encounter for closed fracture with routine healing: Secondary | ICD-10-CM | POA: Diagnosis not present

## 2022-05-31 DIAGNOSIS — G4733 Obstructive sleep apnea (adult) (pediatric): Secondary | ICD-10-CM | POA: Diagnosis not present

## 2022-05-31 DIAGNOSIS — K080 Exfoliation of teeth due to systemic causes: Secondary | ICD-10-CM | POA: Diagnosis not present

## 2022-05-31 DIAGNOSIS — B9620 Unspecified Escherichia coli [E. coli] as the cause of diseases classified elsewhere: Secondary | ICD-10-CM | POA: Diagnosis not present

## 2022-05-31 DIAGNOSIS — E1169 Type 2 diabetes mellitus with other specified complication: Secondary | ICD-10-CM | POA: Diagnosis not present

## 2022-05-31 DIAGNOSIS — K8020 Calculus of gallbladder without cholecystitis without obstruction: Secondary | ICD-10-CM | POA: Diagnosis not present

## 2022-05-31 DIAGNOSIS — H9193 Unspecified hearing loss, bilateral: Secondary | ICD-10-CM | POA: Diagnosis not present

## 2022-05-31 DIAGNOSIS — I2510 Atherosclerotic heart disease of native coronary artery without angina pectoris: Secondary | ICD-10-CM | POA: Diagnosis not present

## 2022-05-31 DIAGNOSIS — J9601 Acute respiratory failure with hypoxia: Secondary | ICD-10-CM | POA: Diagnosis not present

## 2022-05-31 DIAGNOSIS — S32019D Unspecified fracture of first lumbar vertebra, subsequent encounter for fracture with routine healing: Secondary | ICD-10-CM | POA: Diagnosis not present

## 2022-05-31 DIAGNOSIS — N390 Urinary tract infection, site not specified: Secondary | ICD-10-CM | POA: Diagnosis not present

## 2022-05-31 NOTE — Telephone Encounter (Signed)
Received voicemail message from Viera [place], OT with [facility]. Requesting verbal orders for continued OT to focus on dressing, balance, and upper body strength pertaining to transfer safety and independence in the home.   Patient needs OT as follows:  - 2 times a week for 3 weeks, then - 1 time a week for 1 week  Lisa's direct number is [phone]. Ok to leave a Engineer, technical sales. Please advise.

## 2022-06-05 DIAGNOSIS — I2510 Atherosclerotic heart disease of native coronary artery without angina pectoris: Secondary | ICD-10-CM | POA: Diagnosis not present

## 2022-06-05 DIAGNOSIS — J9601 Acute respiratory failure with hypoxia: Secondary | ICD-10-CM | POA: Diagnosis not present

## 2022-06-05 DIAGNOSIS — J440 Chronic obstructive pulmonary disease with acute lower respiratory infection: Secondary | ICD-10-CM | POA: Diagnosis not present

## 2022-06-05 DIAGNOSIS — S82831D Other fracture of upper and lower end of right fibula, subsequent encounter for closed fracture with routine healing: Secondary | ICD-10-CM | POA: Diagnosis not present

## 2022-06-05 DIAGNOSIS — G4733 Obstructive sleep apnea (adult) (pediatric): Secondary | ICD-10-CM | POA: Diagnosis not present

## 2022-06-05 DIAGNOSIS — E1169 Type 2 diabetes mellitus with other specified complication: Secondary | ICD-10-CM | POA: Diagnosis not present

## 2022-06-05 DIAGNOSIS — N390 Urinary tract infection, site not specified: Secondary | ICD-10-CM | POA: Diagnosis not present

## 2022-06-05 DIAGNOSIS — K8020 Calculus of gallbladder without cholecystitis without obstruction: Secondary | ICD-10-CM | POA: Diagnosis not present

## 2022-06-05 DIAGNOSIS — J189 Pneumonia, unspecified organism: Secondary | ICD-10-CM | POA: Diagnosis not present

## 2022-06-05 DIAGNOSIS — H9193 Unspecified hearing loss, bilateral: Secondary | ICD-10-CM | POA: Diagnosis not present

## 2022-06-05 DIAGNOSIS — I2699 Other pulmonary embolism without acute cor pulmonale: Secondary | ICD-10-CM | POA: Diagnosis not present

## 2022-06-05 DIAGNOSIS — I10 Essential (primary) hypertension: Secondary | ICD-10-CM | POA: Diagnosis not present

## 2022-06-05 DIAGNOSIS — S32019D Unspecified fracture of first lumbar vertebra, subsequent encounter for fracture with routine healing: Secondary | ICD-10-CM | POA: Diagnosis not present

## 2022-06-05 DIAGNOSIS — E785 Hyperlipidemia, unspecified: Secondary | ICD-10-CM | POA: Diagnosis not present

## 2022-06-05 DIAGNOSIS — B9620 Unspecified Escherichia coli [E. coli] as the cause of diseases classified elsewhere: Secondary | ICD-10-CM | POA: Diagnosis not present

## 2022-06-05 DIAGNOSIS — E039 Hypothyroidism, unspecified: Secondary | ICD-10-CM | POA: Diagnosis not present

## 2022-06-06 DIAGNOSIS — S32019D Unspecified fracture of first lumbar vertebra, subsequent encounter for fracture with routine healing: Secondary | ICD-10-CM | POA: Diagnosis not present

## 2022-06-06 DIAGNOSIS — G4733 Obstructive sleep apnea (adult) (pediatric): Secondary | ICD-10-CM | POA: Diagnosis not present

## 2022-06-06 DIAGNOSIS — H9193 Unspecified hearing loss, bilateral: Secondary | ICD-10-CM | POA: Diagnosis not present

## 2022-06-06 DIAGNOSIS — J189 Pneumonia, unspecified organism: Secondary | ICD-10-CM | POA: Diagnosis not present

## 2022-06-06 DIAGNOSIS — K8020 Calculus of gallbladder without cholecystitis without obstruction: Secondary | ICD-10-CM | POA: Diagnosis not present

## 2022-06-06 DIAGNOSIS — I2699 Other pulmonary embolism without acute cor pulmonale: Secondary | ICD-10-CM | POA: Diagnosis not present

## 2022-06-06 DIAGNOSIS — N390 Urinary tract infection, site not specified: Secondary | ICD-10-CM | POA: Diagnosis not present

## 2022-06-06 DIAGNOSIS — I10 Essential (primary) hypertension: Secondary | ICD-10-CM | POA: Diagnosis not present

## 2022-06-06 DIAGNOSIS — B9620 Unspecified Escherichia coli [E. coli] as the cause of diseases classified elsewhere: Secondary | ICD-10-CM | POA: Diagnosis not present

## 2022-06-06 DIAGNOSIS — J9601 Acute respiratory failure with hypoxia: Secondary | ICD-10-CM | POA: Diagnosis not present

## 2022-06-06 DIAGNOSIS — E039 Hypothyroidism, unspecified: Secondary | ICD-10-CM | POA: Diagnosis not present

## 2022-06-06 DIAGNOSIS — S82831D Other fracture of upper and lower end of right fibula, subsequent encounter for closed fracture with routine healing: Secondary | ICD-10-CM | POA: Diagnosis not present

## 2022-06-06 DIAGNOSIS — J440 Chronic obstructive pulmonary disease with acute lower respiratory infection: Secondary | ICD-10-CM | POA: Diagnosis not present

## 2022-06-06 DIAGNOSIS — I2510 Atherosclerotic heart disease of native coronary artery without angina pectoris: Secondary | ICD-10-CM | POA: Diagnosis not present

## 2022-06-06 DIAGNOSIS — E1169 Type 2 diabetes mellitus with other specified complication: Secondary | ICD-10-CM | POA: Diagnosis not present

## 2022-06-06 DIAGNOSIS — E785 Hyperlipidemia, unspecified: Secondary | ICD-10-CM | POA: Diagnosis not present

## 2022-06-07 DIAGNOSIS — J9601 Acute respiratory failure with hypoxia: Secondary | ICD-10-CM | POA: Diagnosis not present

## 2022-06-07 DIAGNOSIS — E1169 Type 2 diabetes mellitus with other specified complication: Secondary | ICD-10-CM | POA: Diagnosis not present

## 2022-06-07 DIAGNOSIS — H9193 Unspecified hearing loss, bilateral: Secondary | ICD-10-CM | POA: Diagnosis not present

## 2022-06-07 DIAGNOSIS — S82831D Other fracture of upper and lower end of right fibula, subsequent encounter for closed fracture with routine healing: Secondary | ICD-10-CM | POA: Diagnosis not present

## 2022-06-07 DIAGNOSIS — J440 Chronic obstructive pulmonary disease with acute lower respiratory infection: Secondary | ICD-10-CM | POA: Diagnosis not present

## 2022-06-07 DIAGNOSIS — E039 Hypothyroidism, unspecified: Secondary | ICD-10-CM | POA: Diagnosis not present

## 2022-06-07 DIAGNOSIS — E785 Hyperlipidemia, unspecified: Secondary | ICD-10-CM | POA: Diagnosis not present

## 2022-06-07 DIAGNOSIS — I10 Essential (primary) hypertension: Secondary | ICD-10-CM | POA: Diagnosis not present

## 2022-06-07 DIAGNOSIS — K8020 Calculus of gallbladder without cholecystitis without obstruction: Secondary | ICD-10-CM | POA: Diagnosis not present

## 2022-06-07 DIAGNOSIS — B9620 Unspecified Escherichia coli [E. coli] as the cause of diseases classified elsewhere: Secondary | ICD-10-CM | POA: Diagnosis not present

## 2022-06-07 DIAGNOSIS — I2510 Atherosclerotic heart disease of native coronary artery without angina pectoris: Secondary | ICD-10-CM | POA: Diagnosis not present

## 2022-06-07 DIAGNOSIS — S32019D Unspecified fracture of first lumbar vertebra, subsequent encounter for fracture with routine healing: Secondary | ICD-10-CM | POA: Diagnosis not present

## 2022-06-07 DIAGNOSIS — J189 Pneumonia, unspecified organism: Secondary | ICD-10-CM | POA: Diagnosis not present

## 2022-06-07 DIAGNOSIS — G4733 Obstructive sleep apnea (adult) (pediatric): Secondary | ICD-10-CM | POA: Diagnosis not present

## 2022-06-07 DIAGNOSIS — I2699 Other pulmonary embolism without acute cor pulmonale: Secondary | ICD-10-CM | POA: Diagnosis not present

## 2022-06-07 DIAGNOSIS — N390 Urinary tract infection, site not specified: Secondary | ICD-10-CM | POA: Diagnosis not present

## 2022-06-09 DIAGNOSIS — M8008XD Age-related osteoporosis with current pathological fracture, vertebra(e), subsequent encounter for fracture with routine healing: Secondary | ICD-10-CM | POA: Diagnosis not present

## 2022-06-09 DIAGNOSIS — M8008XG Age-related osteoporosis with current pathological fracture, vertebra(e), subsequent encounter for fracture with delayed healing: Secondary | ICD-10-CM | POA: Diagnosis not present

## 2022-06-10 DIAGNOSIS — G4733 Obstructive sleep apnea (adult) (pediatric): Secondary | ICD-10-CM | POA: Diagnosis not present

## 2022-06-14 ENCOUNTER — Other Ambulatory Visit (HOSPITAL_COMMUNITY): Payer: Self-pay

## 2022-06-15 ENCOUNTER — Other Ambulatory Visit: Payer: Self-pay | Admitting: Family Medicine

## 2022-06-16 DIAGNOSIS — S32019D Unspecified fracture of first lumbar vertebra, subsequent encounter for fracture with routine healing: Secondary | ICD-10-CM | POA: Diagnosis not present

## 2022-06-16 DIAGNOSIS — I10 Essential (primary) hypertension: Secondary | ICD-10-CM | POA: Diagnosis not present

## 2022-06-16 DIAGNOSIS — N390 Urinary tract infection, site not specified: Secondary | ICD-10-CM | POA: Diagnosis not present

## 2022-06-16 DIAGNOSIS — E1169 Type 2 diabetes mellitus with other specified complication: Secondary | ICD-10-CM | POA: Diagnosis not present

## 2022-06-16 DIAGNOSIS — I2699 Other pulmonary embolism without acute cor pulmonale: Secondary | ICD-10-CM | POA: Diagnosis not present

## 2022-06-16 DIAGNOSIS — J189 Pneumonia, unspecified organism: Secondary | ICD-10-CM | POA: Diagnosis not present

## 2022-06-16 DIAGNOSIS — H9193 Unspecified hearing loss, bilateral: Secondary | ICD-10-CM | POA: Diagnosis not present

## 2022-06-16 DIAGNOSIS — J440 Chronic obstructive pulmonary disease with acute lower respiratory infection: Secondary | ICD-10-CM | POA: Diagnosis not present

## 2022-06-16 DIAGNOSIS — G4733 Obstructive sleep apnea (adult) (pediatric): Secondary | ICD-10-CM | POA: Diagnosis not present

## 2022-06-16 DIAGNOSIS — E785 Hyperlipidemia, unspecified: Secondary | ICD-10-CM | POA: Diagnosis not present

## 2022-06-16 DIAGNOSIS — B9620 Unspecified Escherichia coli [E. coli] as the cause of diseases classified elsewhere: Secondary | ICD-10-CM | POA: Diagnosis not present

## 2022-06-16 DIAGNOSIS — S82831D Other fracture of upper and lower end of right fibula, subsequent encounter for closed fracture with routine healing: Secondary | ICD-10-CM | POA: Diagnosis not present

## 2022-06-16 DIAGNOSIS — E039 Hypothyroidism, unspecified: Secondary | ICD-10-CM | POA: Diagnosis not present

## 2022-06-16 DIAGNOSIS — K8020 Calculus of gallbladder without cholecystitis without obstruction: Secondary | ICD-10-CM | POA: Diagnosis not present

## 2022-06-16 DIAGNOSIS — I2510 Atherosclerotic heart disease of native coronary artery without angina pectoris: Secondary | ICD-10-CM | POA: Diagnosis not present

## 2022-06-16 DIAGNOSIS — J9601 Acute respiratory failure with hypoxia: Secondary | ICD-10-CM | POA: Diagnosis not present

## 2022-06-17 DIAGNOSIS — E1169 Type 2 diabetes mellitus with other specified complication: Secondary | ICD-10-CM | POA: Diagnosis not present

## 2022-06-17 DIAGNOSIS — J9601 Acute respiratory failure with hypoxia: Secondary | ICD-10-CM | POA: Diagnosis not present

## 2022-06-17 DIAGNOSIS — I2699 Other pulmonary embolism without acute cor pulmonale: Secondary | ICD-10-CM | POA: Diagnosis not present

## 2022-06-17 DIAGNOSIS — J189 Pneumonia, unspecified organism: Secondary | ICD-10-CM | POA: Diagnosis not present

## 2022-06-17 DIAGNOSIS — K8020 Calculus of gallbladder without cholecystitis without obstruction: Secondary | ICD-10-CM | POA: Diagnosis not present

## 2022-06-17 DIAGNOSIS — H9193 Unspecified hearing loss, bilateral: Secondary | ICD-10-CM | POA: Diagnosis not present

## 2022-06-17 DIAGNOSIS — I10 Essential (primary) hypertension: Secondary | ICD-10-CM | POA: Diagnosis not present

## 2022-06-17 DIAGNOSIS — J440 Chronic obstructive pulmonary disease with acute lower respiratory infection: Secondary | ICD-10-CM | POA: Diagnosis not present

## 2022-06-17 DIAGNOSIS — S32019D Unspecified fracture of first lumbar vertebra, subsequent encounter for fracture with routine healing: Secondary | ICD-10-CM | POA: Diagnosis not present

## 2022-06-17 DIAGNOSIS — I2510 Atherosclerotic heart disease of native coronary artery without angina pectoris: Secondary | ICD-10-CM | POA: Diagnosis not present

## 2022-06-17 DIAGNOSIS — E039 Hypothyroidism, unspecified: Secondary | ICD-10-CM | POA: Diagnosis not present

## 2022-06-17 DIAGNOSIS — G4733 Obstructive sleep apnea (adult) (pediatric): Secondary | ICD-10-CM | POA: Diagnosis not present

## 2022-06-17 DIAGNOSIS — N390 Urinary tract infection, site not specified: Secondary | ICD-10-CM | POA: Diagnosis not present

## 2022-06-17 DIAGNOSIS — E785 Hyperlipidemia, unspecified: Secondary | ICD-10-CM | POA: Diagnosis not present

## 2022-06-17 DIAGNOSIS — S82831D Other fracture of upper and lower end of right fibula, subsequent encounter for closed fracture with routine healing: Secondary | ICD-10-CM | POA: Diagnosis not present

## 2022-06-17 DIAGNOSIS — B9620 Unspecified Escherichia coli [E. coli] as the cause of diseases classified elsewhere: Secondary | ICD-10-CM | POA: Diagnosis not present

## 2022-06-19 DIAGNOSIS — E785 Hyperlipidemia, unspecified: Secondary | ICD-10-CM | POA: Diagnosis not present

## 2022-06-19 DIAGNOSIS — H9193 Unspecified hearing loss, bilateral: Secondary | ICD-10-CM | POA: Diagnosis not present

## 2022-06-19 DIAGNOSIS — J189 Pneumonia, unspecified organism: Secondary | ICD-10-CM | POA: Diagnosis not present

## 2022-06-19 DIAGNOSIS — I2510 Atherosclerotic heart disease of native coronary artery without angina pectoris: Secondary | ICD-10-CM | POA: Diagnosis not present

## 2022-06-19 DIAGNOSIS — J9601 Acute respiratory failure with hypoxia: Secondary | ICD-10-CM | POA: Diagnosis not present

## 2022-06-19 DIAGNOSIS — N390 Urinary tract infection, site not specified: Secondary | ICD-10-CM | POA: Diagnosis not present

## 2022-06-19 DIAGNOSIS — B9620 Unspecified Escherichia coli [E. coli] as the cause of diseases classified elsewhere: Secondary | ICD-10-CM | POA: Diagnosis not present

## 2022-06-19 DIAGNOSIS — K8020 Calculus of gallbladder without cholecystitis without obstruction: Secondary | ICD-10-CM | POA: Diagnosis not present

## 2022-06-19 DIAGNOSIS — J440 Chronic obstructive pulmonary disease with acute lower respiratory infection: Secondary | ICD-10-CM | POA: Diagnosis not present

## 2022-06-19 DIAGNOSIS — E1169 Type 2 diabetes mellitus with other specified complication: Secondary | ICD-10-CM | POA: Diagnosis not present

## 2022-06-19 DIAGNOSIS — I10 Essential (primary) hypertension: Secondary | ICD-10-CM | POA: Diagnosis not present

## 2022-06-19 DIAGNOSIS — E039 Hypothyroidism, unspecified: Secondary | ICD-10-CM | POA: Diagnosis not present

## 2022-06-19 DIAGNOSIS — S32019D Unspecified fracture of first lumbar vertebra, subsequent encounter for fracture with routine healing: Secondary | ICD-10-CM | POA: Diagnosis not present

## 2022-06-19 DIAGNOSIS — G4733 Obstructive sleep apnea (adult) (pediatric): Secondary | ICD-10-CM | POA: Diagnosis not present

## 2022-06-19 DIAGNOSIS — I2699 Other pulmonary embolism without acute cor pulmonale: Secondary | ICD-10-CM | POA: Diagnosis not present

## 2022-06-19 DIAGNOSIS — S82831D Other fracture of upper and lower end of right fibula, subsequent encounter for closed fracture with routine healing: Secondary | ICD-10-CM | POA: Diagnosis not present

## 2022-06-20 ENCOUNTER — Telehealth: Payer: Self-pay

## 2022-06-20 NOTE — Telephone Encounter (Signed)
Misty Stanley with [facility] called to get VO for pt.   Occupational Therapy for 2x a week for 2 weeks then skip a week, then 1x a week for 5 weeks.  Please call/leave voice mail on secure line at [phone]

## 2022-06-21 DIAGNOSIS — J9601 Acute respiratory failure with hypoxia: Secondary | ICD-10-CM | POA: Diagnosis not present

## 2022-06-30 ENCOUNTER — Ambulatory Visit (INDEPENDENT_AMBULATORY_CARE_PROVIDER_SITE_OTHER): Payer: Medicare Other | Admitting: Family Medicine

## 2022-06-30 ENCOUNTER — Encounter: Payer: Self-pay | Admitting: Family Medicine

## 2022-06-30 VITALS — BP 110/70 | HR 66 | Temp 97.4°F | Ht 60.0 in | Wt 126.0 lb

## 2022-06-30 DIAGNOSIS — R519 Headache, unspecified: Secondary | ICD-10-CM | POA: Diagnosis not present

## 2022-06-30 MED ORDER — TOPIRAMATE 25 MG PO TABS: 50.0000 mg | ORAL_TABLET | Freq: Two times a day (BID) | ORAL | 1 refills | Status: DC

## 2022-06-30 NOTE — Progress Notes (Signed)
Subjective:    Patient ID: April [name], female    DOB: 1940/08/17, 82 y.o.   MRN: [id number]  05/18/22 Patient reports a 1 week history of a headache.  It is isolated to the left side of her head.  It seems to originate her left occiput and radiate up into her left temple over her left parietal lobe.  It sharp and intense.  She does have some tenderness to palpation in her left side of her neck and on the left occiput.  There are no vesicles or rash to suggest any shingles.  She denies any pain with range of motion in her neck.  The headache is pulsatile.  There is no neurologic deficit.  Cranial nerves II through XII are grossly intact and muscle strength is 5/5 and equal and symmetric in the upper and lower extremities.  She denies any unilateral lacrimation or rhinorrhea to suggest a cluster headache or hemicrania continua.  She has no history of migraines.  She denies any falls where she hit her head or anything that would make [place] worry about intercranial bleed.  This seems to be more neuropathic in nature.  There are no strokelike symptoms and there is no neurologic deficit.  There is no altered mental status.  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  I doubt any type of vascular headache given how good she looks today.  Begin prednisone for possible nerve impingement in the neck.  Reassess in 1 week.  Proceed with neuroimaging if headache worsens or if the patient develops any neurologic symptoms.  Seek medical attention immediately if she develops any strokelike symptoms or worsening headache..  Physical exam today shows no evidence of temporal arteritis  06/30/22  Patient continues to have a unilateral nervelike headache on the left side of her head.  It begins in her left occiput that radiates above her left ear over her left temple.  It feels very superficial to the patient.   She states that if cold air touches her skin such as a fan or air conditioning, it elicits the pain.  It is an aching constant pain.  There is no neurologic deficit associated with it.  There is no blurred vision.  There is no slurred speech.  She denies any pain palpating the temporal artery.  She denies any pain with chewing Past Medical History:  Diagnosis Date   Abnormal weight gain    Acute diverticulitis 05/13/2021   Arthritis    "joints" (06/13/2013)   COPD (chronic obstructive pulmonary disease) (HCC)    Coronary artery disease, non-occlusive 06/13/2013   Cardiac Cath: sharp Angle take-off of Large Dominant Cx: ~70-80% mid Cx bifurcation lesion (Lateral OM &  AVGCx-PL-PDA both with hairpin ostial & ~50% lesions) - Not PCI amenable due to vessel tortuosity; ~40-50% mid LAD;Ramus - no significnat diseaes; small non-dominant RCA   Diverticulosis    GERD (gastroesophageal reflux disease)    History of stress test    a. 09/2006 nl Dobutamine Echo   Hyperlipidemia    Hypertension    NIDDM (non-insulin dependent diabetes mellitus)    On home oxygen therapy    "2L q hs; runs into my BIPAP" (06/13/2013)   OSA (obstructive sleep apnea)    "BIPAP w/O2" (06/13/2013)   Tracheobronchomalacia    a. followed by [facility].   Past Surgical History:  Procedure  Laterality Date   CARDIAC CATHETERIZATION  06/2013   Cardiac Cath: sharp Angle take-off of Large Dominant Cx: ~70-80% mid Cx bifurcation lesion (Lateral OM &  AVGCx-PL-PDA both with hairpin ostial & ~50% lesions) - Not PCI amenable due to vessel tortuosity; ~40-50% mid LAD;Ramus - no significnat diseaes; small non-dominant RCA   LEFT HEART CATHETERIZATION WITH CORONARY ANGIOGRAM N/A 06/16/2013   Procedure: LEFT HEART CATHETERIZATION WITH CORONARY ANGIOGRAM;  Surgeon: Marykay Lex, MD;  Location: [facility] CATH LAB;  Service: Cardiovascular;  Laterality: N/A;   TRANSTHORACIC ECHOCARDIOGRAM  06/17/2013   Normal LV size and function. EF 55-60% with no  regional WMA. Grade 1 diastolic dysfunction. No significant valvular lesions   TUBAL LIGATION     Current Outpatient Medications on File Prior to Visit  Medication Sig Dispense Refill   albuterol (VENTOLIN HFA) 108 (90 Base) MCG/ACT inhaler Inhale 2 puffs into the lungs every 6 (six) hours as needed for wheezing or shortness of breath (Cough). 18 g 2   apixaban (ELIQUIS) 5 MG TABS tablet Take 1 tablet (5 mg total) by mouth 2 (two) times daily. 180 tablet 1   Calcium Carbonate-Vitamin D (CALCIUM 600 + D PO) Take 1 tablet by mouth daily.     escitalopram (LEXAPRO) 10 MG tablet Take 1 tablet (10 mg total) by mouth daily. 30 tablet 3   gabapentin (NEURONTIN) 600 MG tablet TAKE 1 TABLET BY MOUTH 4 TIMES DAILY 360 tablet 0   guaifenesin (HUMIBID E) 400 MG TABS tablet Take 1 tablet 3 times daily as needed for chest congestion and cough 21 tablet 0   isosorbide mononitrate (IMDUR) 60 MG 24 hr tablet TAKE 1 TABLET BY MOUTH ONCE DAILY 90 tablet 3   LANTUS SOLOSTAR 100 UNIT/ML Solostar Pen INJECT 40-50 UNITS UNDER THE SKIN ONCE EVERY NIGHT AT BEDTIME. 15 mL 3   metFORMIN (GLUCOPHAGE) 500 MG tablet TAKE 1 TABLET BY MOUTH TWICE (2) DAILY WITH A MEAL 180 tablet 3   metoprolol tartrate (LOPRESSOR) 25 MG tablet TAKE ONE TABLET BY MOUTH TWICE A DAY 180 tablet 3   Multiple Vitamin (MULTIVITAMIN) capsule Take 1 capsule by mouth daily.     Omega-3 Fatty Acids (FISH OIL) 1000 MG CAPS Take 1 capsule by mouth daily.     omeprazole (PRILOSEC) 20 MG capsule Take 20 mg by mouth daily.     ondansetron (ZOFRAN-ODT) 4 MG disintegrating tablet Take 1 tablet (4 mg total) by mouth every 8 (eight) hours as needed for nausea or vomiting. 20 tablet 0   ONETOUCH VERIO test strip USE AS DIRECTED TO MONITOR BLOOD SUGARS UP TO 3 TIMES DAILY AS NEEDED 100 each 3   potassium chloride (KLOR-CON) 10 MEQ tablet TAKE ONE TABLET BY MOUTH TWICE A DAY 60 tablet 4   potassium chloride (KLOR-CON) 10 MEQ tablet Take 1 tablet (10 mEq total) by  mouth 2 (two) times daily. 60 tablet 3   predniSONE (DELTASONE) 20 MG tablet 3 tabs poqday 1-2, 2 tabs poqday 3-4, 1 tab poqday 5-6 12 tablet 0   promethazine-dextromethorphan (PROMETHAZINE-DM) 6.25-15 MG/5ML syrup Take 2.5 mLs by mouth at bedtime as needed for cough. 60 mL 0   rosuvastatin (CRESTOR) 40 MG tablet TAKE ONE TABLET BY MOUTH ONCE DAILY 90 tablet 3   torsemide (DEMADEX) 20 MG tablet TAKE 1 TABLET BY MOUTH ONCE DAILY 90 tablet 0   traMADol (ULTRAM) 50 MG tablet TAKE ONE TABLET BY MOUTH AT BEDTIME AS NEEDED FOR SEVERE PAIN 30 tablet 0   No  current facility-administered medications on file prior to visit.   Allergies  Allergen Reactions   Neomycin Other (See Comments)    Visual disturbance and swelling in eyes   Social History   Socioeconomic History   Marital status: Married    Spouse name: Not on file   Number of children: 3   Years of education: Not on file   Highest education level: Not on file  Occupational History   Occupation: Engineer, petroleum  Tobacco Use   Smoking status: Former    Packs/day: 1.50    Years: 20.00    Additional pack years: 0.00    Total pack years: 30.00    Types: Cigarettes    Quit date: 02/13/1986    Years since quitting: 36.4   Smokeless tobacco: Never  Substance and Sexual Activity   Alcohol use: No   Drug use: No   Sexual activity: Yes  Other Topics Concern   Not on file  Social History Narrative   Lives in [place] with her husband. 3 children.  Works is Futures trader.   Former 30 Pk/yr Smoker (1.5 PPD x 20 yr) - quit in 1998.   Social Determinants of Health   Financial Resource Strain: Low Risk  (07/09/2020)   Overall Financial Resource Strain (CARDIA)    Difficulty of Paying Living Expenses: Not hard at all  Food Insecurity: No Food Insecurity (04/19/2022)   Hunger Vital Sign    Worried About Running Out of Food in the Last Year: Never true    Ran Out of Food in the Last Year: Never true  Transportation Needs: No  Transportation Needs (04/19/2022)   PRAPARE - Administrator, Civil Service (Medical): No    Lack of Transportation (Non-Medical): No  Physical Activity: Inactive (07/09/2020)   Exercise Vital Sign    Days of Exercise per Week: 0 days    Minutes of Exercise per Session: 0 min  Stress: No Stress Concern Present (07/09/2020)   [employer] of Occupational Health - Occupational Stress Questionnaire    Feeling of Stress : Not at all  Social Connections: Moderately Integrated (07/09/2020)   Social Connection and Isolation Panel [NHANES]    Frequency of Communication with Friends and Family: More than three times a week    Frequency of Social Gatherings with Friends and Family: More than three times a week    Attends Religious Services: More than 4 times per year    Active Member of [employer] or Organizations: No    Attends Banker Meetings: Never    Marital Status: Married  Catering manager Violence: Not At Risk (04/19/2022)   Humiliation, Afraid, Rape, and Kick questionnaire    Fear of Current or Ex-Partner: No    Emotionally Abused: No    Physically Abused: No    Sexually Abused: No      Review of Systems  Musculoskeletal:  Positive for back pain.  All other systems reviewed and are negative.      Objective:   Physical Exam Constitutional:      General: She is not in acute distress.    Appearance: Normal appearance. She is obese. She is not ill-appearing, toxic-appearing or diaphoretic.  HENT:     Head: Normocephalic and atraumatic.      Right Ear: External ear normal. There is no impacted cerumen.     Left Ear: External ear normal. There is no impacted cerumen.     Nose: Nose normal. No congestion or rhinorrhea.  Mouth/Throat:     Mouth: Mucous membranes are moist.     Pharynx: Oropharynx is clear.  Eyes:     General: No scleral icterus.       Right eye: No discharge.        Left eye: No discharge.     Extraocular Movements: Extraocular  movements intact.     Conjunctiva/sclera: Conjunctivae normal.     Pupils: Pupils are equal, round, and reactive to light.  Neck:     Vascular: No carotid bruit.  Cardiovascular:     Rate and Rhythm: Normal rate and regular rhythm.     Pulses: Normal pulses.     Heart sounds: Murmur heard.     No friction rub. No gallop.  Pulmonary:     Effort: Pulmonary effort is normal. No respiratory distress.     Breath sounds: No stridor. Rales present. No wheezing or rhonchi.  Chest:     Chest wall: No tenderness.  Abdominal:     General: Abdomen is flat. Bowel sounds are normal. There is no distension.     Palpations: Abdomen is soft.     Tenderness: There is no abdominal tenderness. There is no right CVA tenderness, left CVA tenderness, guarding or rebound.     Hernia: No hernia is present.  Musculoskeletal:     Cervical back: Normal range of motion and neck supple. No rigidity.     Right lower leg: No edema.     Left lower leg: No edema.  Lymphadenopathy:     Cervical: No cervical adenopathy.  Skin:    Findings: No bruising, erythema, lesion or rash.  Neurological:     Mental Status: She is alert.     Gait: Gait abnormal.     Deep Tendon Reflexes: Reflexes normal.           Assessment & Plan:  Left-sided headache - Plan: Sedimentation rate Has seems to pathic nature.  She is already on gabapentin.  Will hold gabapentin and start Topamax 25 mg twice daily and uptitrate to 50 mg twice daily over the next few weeks.  If the headache improves we will continue Topamax.  I am also going to consult neurology for possible trigger point injections versus Botox injections.  I do not see any indication to support an MRI.  The headache seems very superficial especially given the fact that cold air elicits the headache

## 2022-07-01 LAB — SEDIMENTATION RATE: Sed Rate: 6 mm/h (ref 0–30)

## 2022-07-05 ENCOUNTER — Encounter: Payer: Self-pay | Admitting: Family Medicine

## 2022-07-05 ENCOUNTER — Ambulatory Visit (INDEPENDENT_AMBULATORY_CARE_PROVIDER_SITE_OTHER): Payer: Medicare Other | Admitting: Family Medicine

## 2022-07-05 ENCOUNTER — Telehealth: Payer: Self-pay

## 2022-07-05 VITALS — BP 114/70 | HR 79 | Temp 97.4°F | Ht 61.0 in | Wt 121.0 lb

## 2022-07-05 DIAGNOSIS — R4182 Altered mental status, unspecified: Secondary | ICD-10-CM | POA: Diagnosis not present

## 2022-07-05 DIAGNOSIS — R350 Frequency of micturition: Secondary | ICD-10-CM | POA: Diagnosis not present

## 2022-07-05 LAB — URINALYSIS, ROUTINE W REFLEX MICROSCOPIC
Bacteria, UA: NONE SEEN /HPF
Glucose, UA: NEGATIVE
Ketones, ur: NEGATIVE
Leukocytes,Ua: NEGATIVE
Nitrite: NEGATIVE
Specific Gravity, Urine: 1.018 (ref 1.001–1.035)
pH: 6 (ref 5.0–8.0)

## 2022-07-05 LAB — MICROSCOPIC MESSAGE

## 2022-07-05 NOTE — Telephone Encounter (Signed)
Care everywhere search 

## 2022-07-05 NOTE — Assessment & Plan Note (Signed)
Patient presented today with concerns from family for altered mental status. UA in office was negative. She did have recent medication changes that could potentially be causing her symptoms but given her recent complaint of headaches and now altered mental status I have recommended patient proceed to the emergency room for further evaluation. This was discussed with her daughter and she is in agreement to take her and have her evaluated.

## 2022-07-05 NOTE — Progress Notes (Signed)
Acute Office Visit  Subjective:     Patient ID: April [name], female    DOB: Sep 25, 1940, 82 y.o.   MRN: [id number]  Chief Complaint  Patient presents with   Acute Visit   Urinary Tract Infection    HPI Patient is in today with her granddaughter for concerns for a UTI due to altered mental status. She has been experiencing a severe headache and was started on Topamax Monday and stopped her Gabapentin. She has been experiencing diarrhea on Monday that is resolved, dry heaves, diminished appetite, fatigue, AMS (did not remember how to check her blood sugar and is more tired than usual). Denies fever, dysuria, pain, difficulty walking, speaking, or unilateral weakness, headache is unchanged.  Review of Systems  All other systems reviewed and are negative.   Past Medical History:  Diagnosis Date   Abnormal weight gain    Acute diverticulitis 05/13/2021   Arthritis    "joints" (06/13/2013)   COPD (chronic obstructive pulmonary disease) (HCC)    Coronary artery disease, non-occlusive 06/13/2013   Cardiac Cath: sharp Angle take-off of Large Dominant Cx: ~70-80% mid Cx bifurcation lesion (Lateral OM &  AVGCx-PL-PDA both with hairpin ostial & ~50% lesions) - Not PCI amenable due to vessel tortuosity; ~40-50% mid LAD;Ramus - no significnat diseaes; small non-dominant RCA   Diverticulosis    GERD (gastroesophageal reflux disease)    History of stress test    a. 09/2006 nl Dobutamine Echo   Hyperlipidemia    Hypertension    NIDDM (non-insulin dependent diabetes mellitus)    On home oxygen therapy    "2L q hs; runs into my BIPAP" (06/13/2013)   OSA (obstructive sleep apnea)    "BIPAP w/O2" (06/13/2013)   Tracheobronchomalacia    a. followed by [facility].   Past Surgical History:  Procedure Laterality Date   CARDIAC CATHETERIZATION  06/2013   Cardiac Cath: sharp Angle take-off of Large Dominant Cx: ~70-80% mid Cx bifurcation lesion (Lateral OM &  AVGCx-PL-PDA both with hairpin ostial &  ~50% lesions) - Not PCI amenable due to vessel tortuosity; ~40-50% mid LAD;Ramus - no significnat diseaes; small non-dominant RCA   LEFT HEART CATHETERIZATION WITH CORONARY ANGIOGRAM N/A 06/16/2013   Procedure: LEFT HEART CATHETERIZATION WITH CORONARY ANGIOGRAM;  Surgeon: Marykay Lex, MD;  Location: [facility] CATH LAB;  Service: Cardiovascular;  Laterality: N/A;   TRANSTHORACIC ECHOCARDIOGRAM  06/17/2013   Normal LV size and function. EF 55-60% with no regional WMA. Grade 1 diastolic dysfunction. No significant valvular lesions   TUBAL LIGATION     Current Outpatient Medications on File Prior to Visit  Medication Sig Dispense Refill   albuterol (VENTOLIN HFA) 108 (90 Base) MCG/ACT inhaler Inhale 2 puffs into the lungs every 6 (six) hours as needed for wheezing or shortness of breath (Cough). 18 g 2   apixaban (ELIQUIS) 5 MG TABS tablet Take 1 tablet (5 mg total) by mouth 2 (two) times daily. 180 tablet 1   Calcium Carbonate-Vitamin D (CALCIUM 600 + D PO) Take 1 tablet by mouth daily.     escitalopram (LEXAPRO) 10 MG tablet Take 1 tablet (10 mg total) by mouth daily. 30 tablet 3   gabapentin (NEURONTIN) 600 MG tablet TAKE 1 TABLET BY MOUTH 4 TIMES DAILY 360 tablet 0   guaifenesin (HUMIBID E) 400 MG TABS tablet Take 1 tablet 3 times daily as needed for chest congestion and cough 21 tablet 0   isosorbide mononitrate (IMDUR) 60 MG 24 hr tablet TAKE 1  TABLET BY MOUTH ONCE DAILY 90 tablet 3   LANTUS SOLOSTAR 100 UNIT/ML Solostar Pen INJECT 40-50 UNITS UNDER THE SKIN ONCE EVERY NIGHT AT BEDTIME. 15 mL 3   metFORMIN (GLUCOPHAGE) 500 MG tablet TAKE 1 TABLET BY MOUTH TWICE (2) DAILY WITH A MEAL 180 tablet 3   metoprolol tartrate (LOPRESSOR) 25 MG tablet TAKE ONE TABLET BY MOUTH TWICE A DAY 180 tablet 3   Multiple Vitamin (MULTIVITAMIN) capsule Take 1 capsule by mouth daily.     Omega-3 Fatty Acids (FISH OIL) 1000 MG CAPS Take 1 capsule by mouth daily.     omeprazole (PRILOSEC) 20 MG capsule Take 20 mg by mouth  daily.     ondansetron (ZOFRAN-ODT) 4 MG disintegrating tablet Take 1 tablet (4 mg total) by mouth every 8 (eight) hours as needed for nausea or vomiting. 20 tablet 0   ONETOUCH VERIO test strip USE AS DIRECTED TO MONITOR BLOOD SUGARS UP TO 3 TIMES DAILY AS NEEDED 100 each 3   potassium chloride (KLOR-CON) 10 MEQ tablet TAKE ONE TABLET BY MOUTH TWICE A DAY 60 tablet 4   potassium chloride (KLOR-CON) 10 MEQ tablet Take 1 tablet (10 mEq total) by mouth 2 (two) times daily. 60 tablet 3   promethazine-dextromethorphan (PROMETHAZINE-DM) 6.25-15 MG/5ML syrup Take 2.5 mLs by mouth at bedtime as needed for cough. 60 mL 0   rosuvastatin (CRESTOR) 40 MG tablet TAKE ONE TABLET BY MOUTH ONCE DAILY 90 tablet 3   topiramate (TOPAMAX) 25 MG tablet Take 2 tablets (50 mg total) by mouth 2 (two) times daily. Wean up gradually on medicine like we discussed 120 tablet 1   torsemide (DEMADEX) 20 MG tablet TAKE 1 TABLET BY MOUTH ONCE DAILY 90 tablet 0   traMADol (ULTRAM) 50 MG tablet TAKE ONE TABLET BY MOUTH AT BEDTIME AS NEEDED FOR SEVERE PAIN 30 tablet 0   predniSONE (DELTASONE) 20 MG tablet 3 tabs poqday 1-2, 2 tabs poqday 3-4, 1 tab poqday 5-6 (Patient not taking: Reported on 07/05/2022) 12 tablet 0   No current facility-administered medications on file prior to visit.   Allergies  Allergen Reactions   Neomycin Other (See Comments)    Visual disturbance and swelling in eyes       Objective:    BP 114/70   Pulse 79   Temp (!) 97.4 F (36.3 C) (Axillary)   Ht 5\' 1"  (1.549 m)   Wt 121 lb (54.9 kg)   LMP  (LMP Unknown)   SpO2 97%   BMI 22.86 kg/m    Physical Exam Vitals and nursing note reviewed.  Constitutional:      Appearance: Normal appearance. She is normal weight.  HENT:     Head: Normocephalic and atraumatic.     Mouth/Throat:     Pharynx: Oropharynx is clear.  Eyes:     Conjunctiva/sclera: Conjunctivae normal.  Cardiovascular:     Rate and Rhythm: Normal rate and regular rhythm.      Pulses: Normal pulses.     Heart sounds: Normal heart sounds.  Pulmonary:     Effort: Pulmonary effort is normal.     Breath sounds: Normal breath sounds.  Abdominal:     General: Bowel sounds are normal. There is no distension.     Palpations: Abdomen is soft.     Tenderness: There is no abdominal tenderness. There is no right CVA tenderness or left CVA tenderness.  Skin:    General: Skin is warm and dry.  Neurological:  General: No focal deficit present.     Mental Status: She is alert.     GCS: GCS eye subscore is 4. GCS verbal subscore is 5. GCS motor subscore is 6.     Cranial Nerves: Cranial nerves 2-12 are intact.     Motor: Weakness present.  Psychiatric:        Mood and Affect: Mood normal.        Behavior: Behavior normal.        Thought Content: Thought content normal.        Judgment: Judgment normal.     No results found for any visits on 07/05/22.      Assessment & Plan:   Problem List Items Addressed This Visit     Altered mental status - Primary    Patient presented today with concerns from family for altered mental status. UA in office was negative. She did have recent medication changes that could potentially be causing her symptoms but given her recent complaint of headaches and now altered mental status I have recommended patient proceed to the emergency room for further evaluation. This was discussed with her daughter and she is in agreement to take her and have her evaluated.       Relevant Orders   CBC with Differential/Platelet   COMPLETE METABOLIC PANEL WITH GFR   Urinalysis, Routine w reflex microscopic   Urine Culture    No orders of the defined types were placed in this encounter.   No follow-ups on file.  Park Meo, FNP

## 2022-07-06 ENCOUNTER — Inpatient Hospital Stay (HOSPITAL_BASED_OUTPATIENT_CLINIC_OR_DEPARTMENT_OTHER)
Admission: EM | Admit: 2022-07-06 | Discharge: 2022-07-08 | DRG: 189 | Disposition: A | Discharge status: Home / Self Care | Payer: Medicare Other | Attending: Internal Medicine | Admitting: Internal Medicine

## 2022-07-06 ENCOUNTER — Encounter (HOSPITAL_BASED_OUTPATIENT_CLINIC_OR_DEPARTMENT_OTHER): Payer: Self-pay

## 2022-07-06 ENCOUNTER — Other Ambulatory Visit: Payer: Self-pay

## 2022-07-06 ENCOUNTER — Emergency Department (HOSPITAL_BASED_OUTPATIENT_CLINIC_OR_DEPARTMENT_OTHER): Payer: Medicare Other | Admitting: Radiology

## 2022-07-06 ENCOUNTER — Emergency Department (HOSPITAL_BASED_OUTPATIENT_CLINIC_OR_DEPARTMENT_OTHER): Payer: Medicare Other

## 2022-07-06 ENCOUNTER — Observation Stay (HOSPITAL_COMMUNITY): Payer: Medicare Other

## 2022-07-06 DIAGNOSIS — J168 Pneumonia due to other specified infectious organisms: Secondary | ICD-10-CM | POA: Diagnosis not present

## 2022-07-06 DIAGNOSIS — E119 Type 2 diabetes mellitus without complications: Secondary | ICD-10-CM

## 2022-07-06 DIAGNOSIS — Z881 Allergy status to other antibiotic agents status: Secondary | ICD-10-CM

## 2022-07-06 DIAGNOSIS — J189 Pneumonia, unspecified organism: Secondary | ICD-10-CM | POA: Diagnosis not present

## 2022-07-06 DIAGNOSIS — G9341 Metabolic encephalopathy: Secondary | ICD-10-CM | POA: Diagnosis not present

## 2022-07-06 DIAGNOSIS — G4733 Obstructive sleep apnea (adult) (pediatric): Secondary | ICD-10-CM | POA: Diagnosis not present

## 2022-07-06 DIAGNOSIS — R112 Nausea with vomiting, unspecified: Secondary | ICD-10-CM | POA: Diagnosis present

## 2022-07-06 DIAGNOSIS — Z1152 Encounter for screening for COVID-19: Secondary | ICD-10-CM

## 2022-07-06 DIAGNOSIS — Z7901 Long term (current) use of anticoagulants: Secondary | ICD-10-CM

## 2022-07-06 DIAGNOSIS — Z8249 Family history of ischemic heart disease and other diseases of the circulatory system: Secondary | ICD-10-CM

## 2022-07-06 DIAGNOSIS — R1110 Vomiting, unspecified: Secondary | ICD-10-CM | POA: Diagnosis not present

## 2022-07-06 DIAGNOSIS — E876 Hypokalemia: Secondary | ICD-10-CM | POA: Diagnosis present

## 2022-07-06 DIAGNOSIS — K828 Other specified diseases of gallbladder: Secondary | ICD-10-CM | POA: Diagnosis present

## 2022-07-06 DIAGNOSIS — Z79899 Other long term (current) drug therapy: Secondary | ICD-10-CM

## 2022-07-06 DIAGNOSIS — R0902 Hypoxemia: Secondary | ICD-10-CM | POA: Diagnosis not present

## 2022-07-06 DIAGNOSIS — E039 Hypothyroidism, unspecified: Secondary | ICD-10-CM | POA: Diagnosis not present

## 2022-07-06 DIAGNOSIS — K219 Gastro-esophageal reflux disease without esophagitis: Secondary | ICD-10-CM | POA: Diagnosis not present

## 2022-07-06 DIAGNOSIS — J9601 Acute respiratory failure with hypoxia: Secondary | ICD-10-CM | POA: Diagnosis not present

## 2022-07-06 DIAGNOSIS — Z87891 Personal history of nicotine dependence: Secondary | ICD-10-CM

## 2022-07-06 DIAGNOSIS — Z86711 Personal history of pulmonary embolism: Secondary | ICD-10-CM

## 2022-07-06 DIAGNOSIS — R059 Cough, unspecified: Secondary | ICD-10-CM | POA: Diagnosis not present

## 2022-07-06 DIAGNOSIS — Z794 Long term (current) use of insulin: Secondary | ICD-10-CM

## 2022-07-06 DIAGNOSIS — G9340 Encephalopathy, unspecified: Secondary | ICD-10-CM | POA: Diagnosis present

## 2022-07-06 DIAGNOSIS — R197 Diarrhea, unspecified: Secondary | ICD-10-CM | POA: Diagnosis present

## 2022-07-06 DIAGNOSIS — Z825 Family history of asthma and other chronic lower respiratory diseases: Secondary | ICD-10-CM

## 2022-07-06 DIAGNOSIS — J440 Chronic obstructive pulmonary disease with acute lower respiratory infection: Secondary | ICD-10-CM | POA: Diagnosis not present

## 2022-07-06 DIAGNOSIS — E785 Hyperlipidemia, unspecified: Secondary | ICD-10-CM | POA: Diagnosis not present

## 2022-07-06 DIAGNOSIS — I2699 Other pulmonary embolism without acute cor pulmonale: Secondary | ICD-10-CM | POA: Diagnosis not present

## 2022-07-06 DIAGNOSIS — R410 Disorientation, unspecified: Secondary | ICD-10-CM | POA: Diagnosis not present

## 2022-07-06 DIAGNOSIS — R4182 Altered mental status, unspecified: Secondary | ICD-10-CM | POA: Diagnosis not present

## 2022-07-06 DIAGNOSIS — E1169 Type 2 diabetes mellitus with other specified complication: Secondary | ICD-10-CM | POA: Diagnosis present

## 2022-07-06 DIAGNOSIS — I6381 Other cerebral infarction due to occlusion or stenosis of small artery: Secondary | ICD-10-CM | POA: Diagnosis not present

## 2022-07-06 DIAGNOSIS — Z7984 Long term (current) use of oral hypoglycemic drugs: Secondary | ICD-10-CM | POA: Diagnosis not present

## 2022-07-06 DIAGNOSIS — I10 Essential (primary) hypertension: Secondary | ICD-10-CM | POA: Diagnosis present

## 2022-07-06 DIAGNOSIS — K8020 Calculus of gallbladder without cholecystitis without obstruction: Secondary | ICD-10-CM | POA: Diagnosis not present

## 2022-07-06 DIAGNOSIS — I2510 Atherosclerotic heart disease of native coronary artery without angina pectoris: Secondary | ICD-10-CM | POA: Diagnosis present

## 2022-07-06 DIAGNOSIS — R519 Headache, unspecified: Secondary | ICD-10-CM | POA: Diagnosis not present

## 2022-07-06 DIAGNOSIS — R930 Abnormal findings on diagnostic imaging of skull and head, not elsewhere classified: Secondary | ICD-10-CM

## 2022-07-06 DIAGNOSIS — J9611 Chronic respiratory failure with hypoxia: Secondary | ICD-10-CM | POA: Diagnosis present

## 2022-07-06 LAB — RESPIRATORY PANEL BY PCR

## 2022-07-06 LAB — CBC WITH DIFFERENTIAL/PLATELET
Abs Immature Granulocytes: 0.01 10*3/uL (ref 0.00–0.07)
Absolute Monocytes: 553 cells/uL (ref 200–950)
Basophils Absolute: 20 cells/uL (ref 0–200)
Basophils Absolute: 0 10*3/uL (ref 0.0–0.1)
Basophils Relative: 0.3 %
Basophils Relative: 0 %
Eosinophils Absolute: 39 cells/uL (ref 15–500)
Eosinophils Absolute: 0 10*3/uL (ref 0.0–0.5)
Eosinophils Relative: 0.6 %
Eosinophils Relative: 1 %
HCT: 48 % — ABNORMAL HIGH (ref 35.0–45.0)
HCT: 46.2 % — ABNORMAL HIGH (ref 36.0–46.0)
Hemoglobin: 15.6 g/dL — ABNORMAL HIGH (ref 11.7–15.5)
Hemoglobin: 15.2 g/dL — ABNORMAL HIGH (ref 12.0–15.0)
Immature Granulocytes: 0 %
Lymphocytes Relative: 35 %
Lymphs Abs: 2360 cells/uL (ref 850–3900)
Lymphs Abs: 2.1 10*3/uL (ref 0.7–4.0)
MCH: 29.1 pg (ref 27.0–33.0)
MCH: 29.5 pg (ref 26.0–34.0)
MCHC: 32.5 g/dL (ref 32.0–36.0)
MCHC: 32.9 g/dL (ref 30.0–36.0)
MCV: 89.4 fL (ref 80.0–100.0)
MCV: 89.7 fL (ref 80.0–100.0)
MPV: 11.3 fL (ref 7.5–12.5)
Monocytes Absolute: 0.6 10*3/uL (ref 0.1–1.0)
Monocytes Relative: 8.5 %
Monocytes Relative: 9 %
Neutro Abs: 3530 cells/uL (ref 1500–7800)
Neutro Abs: 3.4 10*3/uL (ref 1.7–7.7)
Neutrophils Relative %: 54.3 %
Neutrophils Relative %: 55 %
Platelets: 243 10*3/uL (ref 140–400)
Platelets: 215 10*3/uL (ref 150–400)
RBC: 5.37 10*6/uL — ABNORMAL HIGH (ref 3.80–5.10)
RBC: 5.15 MIL/uL — ABNORMAL HIGH (ref 3.87–5.11)
RDW: 12.6 % (ref 11.0–15.0)
RDW: 13.6 % (ref 11.5–15.5)
Total Lymphocyte: 36.3 %
WBC: 6.5 10*3/uL (ref 3.8–10.8)
WBC: 6.2 10*3/uL (ref 4.0–10.5)
nRBC: 0 % (ref 0.0–0.2)

## 2022-07-06 LAB — COMPREHENSIVE METABOLIC PANEL
ALT: 8 U/L (ref 0–44)
AST: 19 U/L (ref 15–41)
Albumin: 3.9 g/dL (ref 3.5–5.0)
Alkaline Phosphatase: 38 U/L (ref 38–126)
Anion gap: 11 (ref 5–15)
BUN: 12 mg/dL (ref 8–23)
CO2: 30 mmol/L (ref 22–32)
Calcium: 11.3 mg/dL — ABNORMAL HIGH (ref 8.9–10.3)
Chloride: 100 mmol/L (ref 98–111)
Creatinine, Ser: 0.59 mg/dL (ref 0.44–1.00)
GFR, Estimated: >60 mL/min (ref >60)
Glucose, Bld: 154 mg/dL — ABNORMAL HIGH (ref 70–99)
Potassium: 3.8 mmol/L (ref 3.5–5.1)
Sodium: 141 mmol/L (ref 135–145)
Total Bilirubin: 0.4 mg/dL (ref 0.3–1.2)
Total Protein: 6.4 g/dL — ABNORMAL LOW (ref 6.5–8.1)

## 2022-07-06 LAB — URINALYSIS, W/ REFLEX TO CULTURE (INFECTION SUSPECTED)
Bacteria, UA: NONE SEEN
Bilirubin Urine: NEGATIVE
Glucose, UA: NEGATIVE mg/dL
Hgb urine dipstick: NEGATIVE
Ketones, ur: NEGATIVE mg/dL
Leukocytes,Ua: NEGATIVE
Nitrite: NEGATIVE
Protein, ur: 30 mg/dL — AB
Specific Gravity, Urine: 1.009 (ref 1.005–1.030)
pH: 6.5 (ref 5.0–8.0)

## 2022-07-06 LAB — COMPLETE METABOLIC PANEL WITH GFR
AG Ratio: 1.6 (calc) (ref 1.0–2.5)
ALT: 9 U/L (ref 6–29)
AST: 13 U/L (ref 10–35)
Albumin: 4 g/dL (ref 3.6–5.1)
Alkaline phosphatase (APISO): 50 U/L (ref 37–153)
BUN: 10 mg/dL (ref 7–25)
CO2: 23 mmol/L (ref 20–32)
Calcium: 11.3 mg/dL — ABNORMAL HIGH (ref 8.6–10.4)
Chloride: 98 mmol/L (ref 98–110)
Creat: 0.72 mg/dL (ref 0.60–0.95)
Globulin: 2.5 g/dL (calc) (ref 1.9–3.7)
Glucose, Bld: 192 mg/dL — ABNORMAL HIGH (ref 65–99)
Potassium: 3 mmol/L — ABNORMAL LOW (ref 3.5–5.3)
Sodium: 140 mmol/L (ref 135–146)
Total Bilirubin: 0.3 mg/dL (ref 0.2–1.2)
Total Protein: 6.5 g/dL (ref 6.1–8.1)
eGFR: 84 mL/min/{1.73_m2} (ref >=60)

## 2022-07-06 LAB — HIV ANTIBODY (ROUTINE TESTING W REFLEX): HIV Screen 4th Generation wRfx: NONREACTIVE

## 2022-07-06 LAB — URINE CULTURE
MICRO NUMBER:: 14990034
SPECIMEN QUALITY:: ADEQUATE

## 2022-07-06 LAB — STREP PNEUMONIAE URINARY ANTIGEN: Strep Pneumo Urinary Antigen: NEGATIVE

## 2022-07-06 LAB — LIPASE, BLOOD: Lipase: 66 U/L — ABNORMAL HIGH (ref 11–51)

## 2022-07-06 LAB — GLUCOSE, CAPILLARY: Glucose-Capillary: 185 mg/dL — ABNORMAL HIGH (ref 70–99)

## 2022-07-06 LAB — LACTIC ACID, PLASMA
Lactic Acid, Venous: 2 mmol/L (ref 0.5–1.9)
Lactic Acid, Venous: 1.5 mmol/L (ref 0.5–1.9)

## 2022-07-06 LAB — TSH: TSH: 2.482 u[IU]/mL (ref 0.350–4.500)

## 2022-07-06 LAB — SARS CORONAVIRUS 2 BY RT PCR: SARS Coronavirus 2 by RT PCR: NEGATIVE

## 2022-07-06 LAB — TROPONIN I (HIGH SENSITIVITY)
Troponin I (High Sensitivity): 4 ng/L (ref <18)
Troponin I (High Sensitivity): 4 ng/L (ref <18)

## 2022-07-06 LAB — AMMONIA: Ammonia: 14 umol/L (ref 9–35)

## 2022-07-06 LAB — BRAIN NATRIURETIC PEPTIDE: B Natriuretic Peptide: 145.6 pg/mL — ABNORMAL HIGH (ref 0.0–100.0)

## 2022-07-06 LAB — PROCALCITONIN: Procalcitonin: <0.1 ng/mL

## 2022-07-06 MED ORDER — ORAL CARE MOUTH RINSE: 15.0000 mL | OROMUCOSAL | Status: DC | PRN

## 2022-07-06 MED ORDER — SODIUM CHLORIDE 0.9 % IV SOLN
500.0000 mg | INTRAVENOUS | Status: DC
  Administered 2022-07-07 – 2022-07-08 (×2): 500 mg via INTRAVENOUS
  Filled 2022-07-06 (×2): qty 5

## 2022-07-06 MED ORDER — INSULIN ASPART 100 UNIT/ML IJ SOLN: 0.0000 [IU] | Freq: Every day | INTRAMUSCULAR | Status: DC

## 2022-07-06 MED ORDER — INSULIN ASPART 100 UNIT/ML IJ SOLN
0.0000 [IU] | Freq: Three times a day (TID) | INTRAMUSCULAR | Status: DC
  Administered 2022-07-07: 3 [IU] via SUBCUTANEOUS
  Administered 2022-07-07: 2 [IU] via SUBCUTANEOUS
  Administered 2022-07-08: 3 [IU] via SUBCUTANEOUS
  Administered 2022-07-08: 8 [IU] via SUBCUTANEOUS

## 2022-07-06 MED ORDER — ONDANSETRON HCL 4 MG/2ML IJ SOLN
4.0000 mg | Freq: Once | INTRAMUSCULAR | Status: AC
  Administered 2022-07-06: 4 mg via INTRAVENOUS
  Filled 2022-07-06: qty 2

## 2022-07-06 MED ORDER — ENOXAPARIN SODIUM 40 MG/0.4ML IJ SOSY: 40.0000 mg | PREFILLED_SYRINGE | INTRAMUSCULAR | Status: DC

## 2022-07-06 MED ORDER — ESCITALOPRAM OXALATE 10 MG PO TABS
10.0000 mg | ORAL_TABLET | Freq: Every day | ORAL | Status: DC
  Administered 2022-07-06 – 2022-07-07 (×2): 10 mg via ORAL
  Filled 2022-07-06 (×2): qty 1

## 2022-07-06 MED ORDER — TOPIRAMATE 25 MG PO TABS
50.0000 mg | ORAL_TABLET | Freq: Two times a day (BID) | ORAL | Status: DC
  Filled 2022-07-06: qty 2

## 2022-07-06 MED ORDER — CALCITONIN (SALMON) 200 UNIT/ACT NA SOLN
1.0000 | Freq: Every day | NASAL | Status: DC
  Administered 2022-07-06 – 2022-07-07 (×2): 1 via NASAL
  Filled 2022-07-06: qty 3.7

## 2022-07-06 MED ORDER — ROSUVASTATIN CALCIUM 20 MG PO TABS
40.0000 mg | ORAL_TABLET | Freq: Every day | ORAL | Status: DC
  Administered 2022-07-07 – 2022-07-08 (×2): 40 mg via ORAL
  Filled 2022-07-06 (×2): qty 2

## 2022-07-06 MED ORDER — GUAIFENESIN-DM 100-10 MG/5ML PO SYRP: 5.0000 mL | ORAL_SOLUTION | Freq: Every evening | ORAL | Status: DC | PRN

## 2022-07-06 MED ORDER — ISOSORBIDE MONONITRATE ER 60 MG PO TB24
60.0000 mg | ORAL_TABLET | Freq: Every day | ORAL | Status: DC
  Administered 2022-07-07 – 2022-07-08 (×2): 60 mg via ORAL
  Filled 2022-07-06 (×2): qty 1

## 2022-07-06 MED ORDER — SODIUM CHLORIDE 0.9 % IV BOLUS
1000.0000 mL | Freq: Once | INTRAVENOUS | Status: AC
  Administered 2022-07-06: 1000 mL via INTRAVENOUS

## 2022-07-06 MED ORDER — METOPROLOL TARTRATE 25 MG PO TABS
25.0000 mg | ORAL_TABLET | Freq: Two times a day (BID) | ORAL | Status: DC
  Administered 2022-07-06 – 2022-07-08 (×4): 25 mg via ORAL
  Filled 2022-07-06 (×4): qty 1

## 2022-07-06 MED ORDER — SODIUM CHLORIDE 0.9 % IV SOLN
1.0000 g | Freq: Once | INTRAVENOUS | Status: AC
  Administered 2022-07-06: 1 g via INTRAVENOUS
  Filled 2022-07-06: qty 10

## 2022-07-06 MED ORDER — PANTOPRAZOLE SODIUM 40 MG PO TBEC
40.0000 mg | DELAYED_RELEASE_TABLET | Freq: Every day | ORAL | Status: DC
  Administered 2022-07-06 – 2022-07-07 (×2): 40 mg via ORAL
  Filled 2022-07-06 (×2): qty 1

## 2022-07-06 MED ORDER — IBUPROFEN 400 MG PO TABS
400.0000 mg | ORAL_TABLET | Freq: Four times a day (QID) | ORAL | Status: DC | PRN
  Administered 2022-07-06 – 2022-07-08 (×3): 400 mg via ORAL
  Filled 2022-07-06 (×3): qty 1

## 2022-07-06 MED ORDER — POLYVINYL ALCOHOL 1.4 % OP SOLN
1.0000 [drp] | OPHTHALMIC | Status: DC | PRN
  Filled 2022-07-06: qty 15

## 2022-07-06 MED ORDER — GABAPENTIN 300 MG PO CAPS
600.0000 mg | ORAL_CAPSULE | Freq: Two times a day (BID) | ORAL | Status: DC
  Administered 2022-07-06 – 2022-07-08 (×4): 600 mg via ORAL
  Filled 2022-07-06 (×4): qty 2

## 2022-07-06 MED ORDER — SODIUM CHLORIDE 0.9 % IV SOLN
2.0000 g | INTRAVENOUS | Status: DC
  Administered 2022-07-07 – 2022-07-08 (×2): 2 g via INTRAVENOUS
  Filled 2022-07-06 (×2): qty 20

## 2022-07-06 MED ORDER — SODIUM CHLORIDE 0.9 % IV SOLN
500.0000 mg | Freq: Once | INTRAVENOUS | Status: AC
  Administered 2022-07-06: 500 mg via INTRAVENOUS
  Filled 2022-07-06: qty 5

## 2022-07-06 MED ORDER — APIXABAN 5 MG PO TABS
5.0000 mg | ORAL_TABLET | Freq: Two times a day (BID) | ORAL | Status: DC
  Administered 2022-07-06 – 2022-07-08 (×4): 5 mg via ORAL
  Filled 2022-07-06 (×4): qty 1

## 2022-07-06 NOTE — Assessment & Plan Note (Signed)
BIPAP QHS 

## 2022-07-06 NOTE — ED Triage Notes (Signed)
Patient here POV from Home.  Endorses General malaise that began over the weekend. Has been mildly confused recently as well (slow to answer questions) over the weekend. Seen by PCP and Urine was unremarkable. Began to have N/V today.   No Known fevers. Some Diarrhea as well initially  NAD Note during Triage. BIB Wheelchair.

## 2022-07-06 NOTE — Assessment & Plan Note (Addendum)
Pt with recent acute PE on 04/18/22. Continue Eliquis Tele monitor Worsening PE is in the differential for todays hypoxia admission: however, large PE with RHS seems a bit less likely today given Adherence to [facility] Neg trops x2 Positive CXR findings for PNA vs edema + rhonchi on respiratory exam in ED.

## 2022-07-06 NOTE — Assessment & Plan Note (Signed)
Cont crestor °

## 2022-07-06 NOTE — Progress Notes (Signed)
Pt roomed to floor around 1830 hrs, admitting MD paged and waiting for orders

## 2022-07-06 NOTE — Assessment & Plan Note (Signed)
Cont home Imdur, Lopressor

## 2022-07-06 NOTE — Assessment & Plan Note (Signed)
Listed on chart but looks like pt on no thyroid meds per home med rec. TSH today is nl at 2.48

## 2022-07-06 NOTE — ED Notes (Addendum)
Pt was alert and oriented x4 on assessment, but she was more tired than usual per patient and family. Also, had some new eye redness (interior and exterior). IVF started and Zofran given.

## 2022-07-06 NOTE — ED Notes (Signed)
Called [facility] -- informed that pt bed assignment is ready 

## 2022-07-06 NOTE — Assessment & Plan Note (Signed)
Holding home metformin and lantus for the moment (looks like she takes lantus only "PRN" per med rec) Putting pt on mod scale SSI AC/HS for now.

## 2022-07-06 NOTE — ED Provider Notes (Signed)
EMERGENCY DEPARTMENT AT [facility] Provider Note   CSN: [id number] Arrival date & time: 07/06/22  1009     History  Chief Complaint  Patient presents with   Altered Mental Status    April [name] is a 82 y.o. female.  The history is provided by the patient and medical records. No language interpreter was used.  Altered Mental Status Presenting symptoms: confusion and disorientation   Severity:  Moderate Most recent episode:  More than 2 days ago Episode history:  Continuous Timing:  Constant Progression:  Waxing and waning Chronicity:  New Context: recent change in medication   Context: not dementia   Associated symptoms: light-headedness and nausea   Associated symptoms: no abdominal pain, no depression, no fever, no headaches, no palpitations, no rash, no vomiting and no weakness        Home Medications Prior to Admission medications   Medication Sig Start Date End Date Taking? Authorizing Provider  albuterol (VENTOLIN HFA) 108 (90 Base) MCG/ACT inhaler Inhale 2 puffs into the lungs every 6 (six) hours as needed for wheezing or shortness of breath (Cough). 02/10/22   Theadora Rama Scales, PA-C  apixaban (ELIQUIS) 5 MG TABS tablet Take 1 tablet (5 mg total) by mouth 2 (two) times daily. 05/18/22   Donita Brooks, MD  Calcium Carbonate-Vitamin D (CALCIUM 600 + D PO) Take 1 tablet by mouth daily.    [provider]  escitalopram (LEXAPRO) 10 MG tablet Take 1 tablet (10 mg total) by mouth daily. 04/06/22   Donita Brooks, MD  gabapentin (NEURONTIN) 600 MG tablet TAKE 1 TABLET BY MOUTH 4 TIMES DAILY 02/16/22   Donita Brooks, MD  guaifenesin (HUMIBID E) 400 MG TABS tablet Take 1 tablet 3 times daily as needed for chest congestion and cough 02/10/22   Theadora Rama Scales, PA-C  isosorbide mononitrate (IMDUR) 60 MG 24 hr tablet TAKE 1 TABLET BY MOUTH ONCE DAILY 05/23/21   Marykay Lex, MD  LANTUS SOLOSTAR 100 UNIT/ML Solostar Pen  INJECT 40-50 UNITS UNDER THE SKIN ONCE EVERY NIGHT AT BEDTIME. 01/23/22   Donita Brooks, MD  metFORMIN (GLUCOPHAGE) 500 MG tablet TAKE 1 TABLET BY MOUTH TWICE (2) DAILY WITH A MEAL 09/02/21   Donita Brooks, MD  metoprolol tartrate (LOPRESSOR) 25 MG tablet TAKE ONE TABLET BY MOUTH TWICE A DAY 05/23/22   Marykay Lex, MD  Multiple Vitamin (MULTIVITAMIN) capsule Take 1 capsule by mouth daily.    [provider]  Omega-3 Fatty Acids (FISH OIL) 1000 MG CAPS Take 1 capsule by mouth daily.    [provider]  omeprazole (PRILOSEC) 20 MG capsule Take 20 mg by mouth daily.    [provider]  ondansetron (ZOFRAN-ODT) 4 MG disintegrating tablet Take 1 tablet (4 mg total) by mouth every 8 (eight) hours as needed for nausea or vomiting. 02/10/22   Theadora Rama Scales, PA-C  ONETOUCH VERIO test strip USE AS DIRECTED TO MONITOR BLOOD SUGARS UP TO 3 TIMES DAILY AS NEEDED 10/27/21   Donita Brooks, MD  potassium chloride (KLOR-CON) 10 MEQ tablet TAKE ONE TABLET BY MOUTH TWICE A DAY 05/02/22   Donita Brooks, MD  potassium chloride (KLOR-CON) 10 MEQ tablet Take 1 tablet (10 mEq total) by mouth 2 (two) times daily. 05/02/22   Donita Brooks, MD  predniSONE (DELTASONE) 20 MG tablet 3 tabs poqday 1-2, 2 tabs poqday 3-4, 1 tab poqday 5-6 Patient not taking: Reported on 07/05/2022 05/18/22  Donita Brooks, MD  promethazine-dextromethorphan (PROMETHAZINE-DM) 6.25-15 MG/5ML syrup Take 2.5 mLs by mouth at bedtime as needed for cough. 04/24/22   Donita Brooks, MD  rosuvastatin (CRESTOR) 40 MG tablet TAKE ONE TABLET BY MOUTH ONCE DAILY 05/23/21   Marykay Lex, MD  topiramate (TOPAMAX) 25 MG tablet Take 2 tablets (50 mg total) by mouth 2 (two) times daily. Wean up gradually on medicine like we discussed 06/30/22   Donita Brooks, MD  torsemide (DEMADEX) 20 MG tablet TAKE 1 TABLET BY MOUTH ONCE DAILY 04/04/22   Donita Brooks, MD  traMADol (ULTRAM) 50 MG tablet TAKE ONE  TABLET BY MOUTH AT BEDTIME AS NEEDED FOR SEVERE PAIN 06/15/22   Donita Brooks, MD      Allergies    Neomycin    Review of Systems   Review of Systems  Constitutional:  Positive for chills and fatigue. Negative for fever.  HENT:  Negative for congestion.   Eyes:  Negative for visual disturbance.  Respiratory:  Positive for cough and shortness of breath. Negative for chest tightness and wheezing.   Cardiovascular:  Negative for chest pain and palpitations.  Gastrointestinal:  Positive for diarrhea and nausea. Negative for abdominal pain, constipation and vomiting.  Genitourinary:  Negative for dysuria.  Musculoskeletal:  Negative for back pain, neck pain and neck stiffness.  Skin:  Negative for rash and wound.  Neurological:  Positive for light-headedness. Negative for dizziness, weakness and headaches.  Psychiatric/Behavioral:  Positive for confusion.   All other systems reviewed and are negative.   Physical Exam Updated Vital Signs BP (!) 152/61 (BP Location: Right Arm)   Pulse 67   Temp 97.9 F (36.6 C) (Oral)   Resp 20   Ht 5\' 1"  (1.549 m)   Wt 54.9 kg   LMP  (LMP Unknown)   SpO2 92%   BMI 22.87 kg/m  Physical Exam Vitals and nursing note reviewed.  Constitutional:      General: She is not in acute distress.    Appearance: She is well-developed. She is not ill-appearing, toxic-appearing or diaphoretic.  HENT:     Head: Normocephalic and atraumatic.     Nose: No congestion or rhinorrhea.     Mouth/Throat:     Mouth: Mucous membranes are dry.     Pharynx: No oropharyngeal exudate or posterior oropharyngeal erythema.  Eyes:     Extraocular Movements: Extraocular movements intact.     Conjunctiva/sclera: Conjunctivae normal.     Pupils: Pupils are equal, round, and reactive to light.  Neck:     Vascular: No carotid bruit.  Cardiovascular:     Rate and Rhythm: Normal rate and regular rhythm.     Heart sounds: No murmur heard. Pulmonary:     Effort: Pulmonary  effort is normal. No respiratory distress.     Breath sounds: Rhonchi present. No wheezing or rales.  Chest:     Chest wall: No tenderness.  Abdominal:     General: Abdomen is flat.     Palpations: Abdomen is soft.     Tenderness: There is no abdominal tenderness. There is no right CVA tenderness, left CVA tenderness, guarding or rebound.  Musculoskeletal:        General: No swelling or tenderness.     Cervical back: Neck supple. No tenderness.     Right lower leg: No edema.     Left lower leg: No edema.  Skin:    General: Skin is warm and dry.  Capillary Refill: Capillary refill takes less than 2 seconds.     Findings: No erythema or rash.  Neurological:     General: No focal deficit present.     Mental Status: She is alert.     Sensory: No sensory deficit.     Motor: No weakness.     ED Results / Procedures / Treatments   Labs (all labs ordered are listed, but only abnormal results are displayed) Labs Reviewed  CBC WITH DIFFERENTIAL/PLATELET - Abnormal; Notable for the following components:      Result Value   RBC 5.15 (*)    Hemoglobin 15.2 (*)    HCT 46.2 (*)    All other components within normal limits  COMPREHENSIVE METABOLIC PANEL - Abnormal; Notable for the following components:   Glucose, Bld 154 (*)    Calcium 11.3 (*)    Total Protein 6.4 (*)    All other components within normal limits  LACTIC ACID, PLASMA - Abnormal; Notable for the following components:   Lactic Acid, Venous 2.0 (*)    All other components within normal limits  LIPASE, BLOOD - Abnormal; Notable for the following components:   Lipase 66 (*)    All other components within normal limits  URINALYSIS, W/ REFLEX TO CULTURE (INFECTION SUSPECTED) - Abnormal; Notable for the following components:   Color, Urine COLORLESS (*)    Protein, ur 30 (*)    All other components within normal limits  CULTURE, BLOOD (ROUTINE X 2)  CULTURE, BLOOD (ROUTINE X 2)  LACTIC ACID, PLASMA  TSH  AMMONIA   TROPONIN I (HIGH SENSITIVITY)  TROPONIN I (HIGH SENSITIVITY)    EKG EKG Interpretation  Date/Time:  Thursday Jul 06 2022 10:21:05 EDT Ventricular Rate:  65 PR Interval:  160 QRS Duration: 93 QT Interval:  411 QTC Calculation: 428 R Axis:   29 Text Interpretation: Sinus rhythm Probable anteroseptal infarct, old when compared to prior, slower rate. No STEMI Confirmed by Theda Belfast ([id number]) on 07/06/2022 11:03:49 AM  Radiology US Abdomen Limited RUQ (LIVER/GB)  Result Date: 07/06/2022 CLINICAL DATA:  Emesis EXAM: ULTRASOUND ABDOMEN LIMITED RIGHT UPPER QUADRANT COMPARISON:  04/18/2022 and CT scan of 04/18/2022 FINDINGS: Gallbladder: Numerous small layering gallstones. Sludge noted. No sonographic Murphy sign noted by sonographer. Common bile duct: Diameter: 0.6 cm (within normal limits for age) Liver: No focal lesion identified. Within normal limits in parenchymal echogenicity. Portal vein is patent on color Doppler imaging with normal direction of blood flow towards the liver. Other: None. IMPRESSION: 1. Cholelithiasis and gallbladder sludge. No evidence of acute cholecystitis. Electronically Signed   By: Gaylyn Rong M.D.   On: 07/06/2022 12:21   DG Chest 2 View  Result Date: 07/06/2022 CLINICAL DATA:  Cough EXAM: CHEST - 2 VIEW COMPARISON:  CXR 04/18/22 FINDINGS: The heart size and mediastinal contours are within normal limits. No pleural effusion pneumothorax. No focal airspace opacity. There are prominent bilateral interstitial opacities that could represent pulmonary venous congestion or atypical infection. Redemonstrated severe compression deformity at L1. IMPRESSION: Prominent bilateral interstitial opacities could represent pulmonary venous congestion or atypical infection. Electronically Signed   By: Lorenza Cambridge M.D.   On: 07/06/2022 11:59   CT Head Wo Contrast  Result Date: 07/06/2022 CLINICAL DATA:  Mental status change, persistent or worsening. Confusion and mild  headache. EXAM: CT HEAD WITHOUT CONTRAST TECHNIQUE: Contiguous axial images were obtained from the base of the skull through the vertex without intravenous contrast. RADIATION DOSE REDUCTION: This exam was performed according  to the departmental dose-optimization program which includes automated exposure control, adjustment of the mA and/or kV according to patient size and/or use of iterative reconstruction technique. COMPARISON:  None Available. FINDINGS: Brain: No acute hemorrhage. Age-indeterminate lacunar infarct in the right thalamus (image 14 series 2). Gray-white differentiation is otherwise preserved. No hydrocephalus or extra-axial collection. No mass effect or midline shift. Vascular: No hyperdense vessel or unexpected calcification. Skull: No calvarial fracture or suspicious bone lesion. Skull base is unremarkable. Sinuses/Orbits: Moderate left maxillary and left sphenoid sinus disease. Orbits are unremarkable. Other: None. IMPRESSION: 1. 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 Consider MRI of the brain without contrast for further characterization, if clinically indicated. 2. No acute hemorrhage. 3. Moderate left maxillary and left sphenoid sinus disease. Electronically Signed   By: Orvan Falconer M.D.   On: 07/06/2022 11:46    Procedures Procedures    Medications Ordered in ED Medications - No data to display  ED Course/ Medical Decision Making/ A&P                             Medical Decision Making Amount and/or Complexity of Data Reviewed Labs: ordered. Radiology: ordered.  Risk Prescription drug management. Decision regarding hospitalization.    Laylonie P Huffine is a 82 y.o. 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  According to patient, she has had symptoms for the last few days.  She has been acting confused per family and is getting disoriented and more fatigued and drained.  She has less energy than normal.  She has had nausea vomiting and some loose stools but denied blood in her emesis or stools.  She does not complain of any abdominal pain or abdominal trauma.  She denies any chest pain or shortness of breath but does have some cough.  Oxygen saturations are in the low 90s on room air.  She she is not complaining of back or flank pain.  Is not complaining of neck pain or neck stiffness.  She has some mild headache but thinks she is dehydrated.  She has had some chills but no documented fevers.  She is accompanied by family who think she is dehydrated from the nausea vomiting diarrhea.  She had no urinary symptoms and was told she did not have a urinary tract infection the other day as family suspected.  On exam, lungs had some coarseness mildly.  Chest nontender.  Abdomen nontender.  Bowel sounds appreciated.  No focal neurologic deficit initially with intact sensation strength and pulses in extremities.  Symmetric smile.  Clear speech.  Pupils are symmetric and reactive with normal extraocular movements.  Patient is warm to the touch, will get rectal temp.  Clinically I do suspect some dehydration related to the nausea vomiting and diarrhea.  With her history of gallstones and this vomiting we will get an ultrasound and get screening labs.  Will get chest x-ray to rule out pneumonia with her cough and soft oxygen numbers.  We will get other labs.  Will get CT head given the altered mental status and mild headache.  Given her lack of neck pain or neck stiffness or documented fever have less suspicion for meningitis at this time.  Will give her some nausea medicine  and some fluids and reassess.  If workup reassuring and she is feeling better, anticipate discharge home  however if she is still altered, patient may require admission.  Lack of focal neurologic deficit initially, low suspicion for stroke at this time.  2:53 PM CT scan returned showing age-indeterminate stroke.  Will order MRI to clarify if patient is having acute stroke or not although with lack of focal deficits I have less suspicion.  Patient's oxygen saturations began to dip into the 80s and 70s intermittently.  She is still having cough and some shortness of breath.    X-ray returned showing evidence of pneumonia.  Given the hypoxia and altered mental status from baseline, will order antibiotics and admit for further management.  MAR I will be ordered to clarify if patient's abnormal CT is new and related to her altered mental status.  She still has no focal neurologic deficits on exam.  Will call for admission for further management.         Final Clinical Impression(s) / ED Diagnoses Final diagnoses:  Hypoxia  Pneumonia due to infectious organism, unspecified laterality, unspecified part of lung  Abnormal head CT  Altered mental status, unspecified altered mental status type    Clinical Impression: 1. Hypoxia   2. Pneumonia due to infectious organism, unspecified laterality, unspecified part of lung   3. Abnormal head CT   4. Altered mental status, unspecified altered mental status type     Disposition: Admit  This note was prepared with assistance of Dragon voice recognition software. Occasional wrong-word or sound-a-like substitutions may have occurred due to the inherent limitations of voice recognition software.     Diora Bellizzi, Canary Brim, MD 07/06/22 [phone]

## 2022-07-06 NOTE — Assessment & Plan Note (Signed)
DDx = atypical PNA vs CHF. Treating as atypical PNA for the moment since treatment down that path started by EDP. PNA pathway COVID and RVP pending Procalcitonin pending Empiric rocephin + azithromycin BNP pending Tele monitor Urine for S.Pneumo and legionella Got 1L IVF bolus in ED

## 2022-07-06 NOTE — Assessment & Plan Note (Addendum)
Most likely delirium secondary to viral process. DDx includes side effect of topamax (family thinks this) which was started on Monday for headaches. DDx also includes grief reaction / depression given recent death of husband Getting MRI to r/o stroke. Stopping topamax Will put back on PRN Advil for headaches for the moment Did discuss risk of GI ulcer with ongoing NSAID use + eliquis. But admittedly, HGB 15.2, no melena nor hematemesis -> no evidence of GIB at this time.

## 2022-07-06 NOTE — ED Notes (Signed)
Patient taken to restroom in wheelchair. Patient was able to stand and pivot with minimal assist.

## 2022-07-06 NOTE — Assessment & Plan Note (Signed)
Possibly just related to acute viral illness / what ever is causing her PNA. DDx includes acute cholecystitis given the h/o gallstones seen in March, though with normal LFTs and no c/o abd pain, this seems a little less likely. IVF: 1L bolus in ED Zofran PRN Repeat CMP in AM

## 2022-07-06 NOTE — H&P (Signed)
History and Physical    Patient: April [name] UJW:[id number] DOB: 01-Jul-1940 DOA: 07/06/2022 DOS: the patient was seen and examined on 07/06/2022 PCP: Donita Brooks, MD  Patient coming from: Home  Chief Complaint:  Chief Complaint  Patient presents with   Altered Mental Status   HPI: April [name] is a 82 y.o. female with medical history significant of OSA on BIPAP, DM2, COPD, recent PE in March of this year now on eliquis.  Gallstones noted on CT that admit, but not felt to have acute cholecystitis.  Pt in to ED today with intermittent confusion over past couple of days.  Diarrhea over past couple of days.  Today developed N/V.  Pt found to have new 2L O2 requirement in ED.  CXR suspicious for atypical PNA vs CHF.  Given other symptoms PNA / viral illness felt more likely.  Pt started on ABx in ED.   Review of Systems: As mentioned in the history of present illness. All other systems reviewed and are negative. Past Medical History:  Diagnosis Date   Abnormal weight gain    Acute diverticulitis 05/13/2021   Arthritis    "joints" (06/13/2013)   COPD (chronic obstructive pulmonary disease) (HCC)    Coronary artery disease, non-occlusive 06/13/2013   Cardiac Cath: sharp Angle take-off of Large Dominant Cx: ~70-80% mid Cx bifurcation lesion (Lateral OM &  AVGCx-PL-PDA both with hairpin ostial & ~50% lesions) - Not PCI amenable due to vessel tortuosity; ~40-50% mid LAD;Ramus - no significnat diseaes; small non-dominant RCA   Diverticulosis    GERD (gastroesophageal reflux disease)    History of stress test    a. 09/2006 nl Dobutamine Echo   Hyperlipidemia    Hypertension    NIDDM (non-insulin dependent diabetes mellitus)    On home oxygen therapy    "2L q hs; runs into my BIPAP" (06/13/2013)   OSA (obstructive sleep apnea)    "BIPAP w/O2" (06/13/2013)   Tracheobronchomalacia    a. followed by [facility]   Past Surgical History:  Procedure Laterality Date   CARDIAC  CATHETERIZATION  06/2013   Cardiac Cath: sharp Angle take-off of Large Dominant Cx: ~70-80% mid Cx bifurcation lesion (Lateral OM &  AVGCx-PL-PDA both with hairpin ostial & ~50% lesions) - Not PCI amenable due to vessel tortuosity; ~40-50% mid LAD;Ramus - no significnat diseaes; small non-dominant RCA   LEFT HEART CATHETERIZATION WITH CORONARY ANGIOGRAM N/A 06/16/2013   Procedure: LEFT HEART CATHETERIZATION WITH CORONARY ANGIOGRAM;  Surgeon: Marykay Lex, MD;  Location[facility] C[facility] LAB;  Service: Cardiovascular;  Laterality: N/A;   TRANSTHORACIC ECHOCARDIOGRAM  06/17/2013   Normal LV size and function. EF 55-60% with no regional WMA. Grade 1 diastolic dysfunction. No significant valvular lesions   TUBAL LIGATION     Social History:  reports that she quit smoking about 36 years ago. Her smoking use included cigarettes. She has a 30.00 pack-year smoking history. She has never used smokeless tobacco. She reports that she does not drink alcohol and does not use drugs.  Allergies  Allergen Reactions   Neomycin Other (See Comments)    Visual disturbance and swelling in eyes    Family History  Problem Relation Age of Onset   Heart disease Mother        developed coronary dzs in her 63's.   Clotting disorder Mother    Breast cancer Sister 28   Diabetes Paternal Aunt    Asthma Maternal Grandmother    Heart disease Maternal Grandmother  Heart disease Maternal Grandfather    Diabetes Brother    CAD Brother        s/p cabg in his 80's   CAD Brother        s/p cabg in his 43's.    Prior to Admission medications   Medication Sig Start Date End Date Taking? Authorizing Provider  apixaban (ELIQUIS) 5 MG TABS tablet Take 1 tablet (5 mg total) by mouth 2 (two) times daily. 05/18/22  Yes Donita Brooks, MD  calcitonin, salmon, (MIACALCIN/FORTICAL) 200 UNIT/ACT nasal spray Place into alternate nostrils.   Yes [provider]  Calcium Carbonate-Vitamin D (CALCIUM 600 + D PO) Take 1 tablet by  mouth daily.   Yes [provider]  escitalopram (LEXAPRO) 10 MG tablet Take 1 tablet (10 mg total) by mouth daily. 04/06/22  Yes Donita Brooks, MD  gabapentin (NEURONTIN) 600 MG tablet TAKE 1 TABLET BY MOUTH 4 TIMES DAILY Patient taking differently: Take 600 mg by mouth 2 (two) times daily. 02/16/22  Yes Donita Brooks, MD  isosorbide mononitrate (IMDUR) 60 MG 24 hr tablet TAKE 1 TABLET BY MOUTH ONCE DAILY 05/23/21  Yes Marykay Lex, MD  metFORMIN (GLUCOPHAGE) 500 MG tablet TAKE 1 TABLET BY MOUTH TWICE (2) DAILY WITH A MEAL 09/02/21  Yes Donita Brooks, MD  metoprolol tartrate (LOPRESSOR) 25 MG tablet TAKE ONE TABLET BY MOUTH TWICE A DAY 05/23/22  Yes Marykay Lex, MD  Omega-3 Fatty Acids (FISH OIL) 1000 MG CAPS Take 1 capsule by mouth daily.   Yes [provider]  omeprazole (PRILOSEC) 20 MG capsule Take 20 mg by mouth daily.   Yes [provider]  ondansetron (ZOFRAN-ODT) 4 MG disintegrating tablet Take 1 tablet (4 mg total) by mouth every 8 (eight) hours as needed for nausea or vomiting. 02/10/22  Yes Theadora Rama Scales, PA-C  promethazine-dextromethorphan (PROMETHAZINE-DM) 6.25-15 MG/5ML syrup Take 2.5 mLs by mouth at bedtime as needed for cough. 04/24/22  Yes Donita Brooks, MD  rosuvastatin (CRESTOR) 40 MG tablet TAKE ONE TABLET BY MOUTH ONCE DAILY 05/23/21  Yes Marykay Lex, MD  topiramate (TOPAMAX) 25 MG tablet Take 2 tablets (50 mg total) by mouth 2 (two) times daily. Wean up gradually on medicine like we discussed 06/30/22  Yes Pickard, Priscille Heidelberg, MD  traMADol (ULTRAM) 50 MG tablet TAKE ONE TABLET BY MOUTH AT BEDTIME AS NEEDED FOR SEVERE PAIN 06/15/22  Yes Donita Brooks, MD  albuterol (VENTOLIN HFA) 108 (90 Base) MCG/ACT inhaler Inhale 2 puffs into the lungs every 6 (six) hours as needed for wheezing or shortness of breath (Cough). Patient not taking: Reported on 07/06/2022 02/10/22   Theadora Rama Scales, PA-C  LANTUS SOLOSTAR 100 UNIT/ML  Solostar Pen INJECT 40-50 UNITS UNDER THE SKIN ONCE EVERY NIGHT AT BEDTIME. 01/23/22   Donita Brooks, MD  ONETOUCH VERIO test strip USE AS DIRECTED TO MONITOR BLOOD SUGARS UP TO 3 TIMES DAILY AS NEEDED 10/27/21   Donita Brooks, MD  torsemide (DEMADEX) 20 MG tablet TAKE 1 TABLET BY MOUTH ONCE DAILY Patient not taking: Reported on 07/06/2022 04/04/22   Donita Brooks, MD    Physical Exam: Vitals:   07/06/22 1115 07/06/22 1411 07/06/22 1445 07/06/22 1817  BP: (!) 141/61  (!) 144/88 (!) 149/77  Pulse: 69  80 82  Resp: 15  (!) 23 17  Temp:  97.9 F (36.6 C)    TempSrc:  Oral    SpO2: 96%  93%  98%  Weight:      Height:       Constitutional: NAD, calm, comfortable Respiratory: B rhonchi Cardiovascular: Regular rate and rhythm, no murmurs / rubs / gallops. No extremity edema. 2+ pedal pulses. No carotid bruits.  Abdomen: no tenderness, no masses palpated. No hepatosplenomegaly. Bowel sounds positive.  Neurologic: CN 2-12 grossly intact. Sensation intact, DTR normal. Strength 5/5 in all 4.  Psychiatric: Normal judgment and insight. Alert and oriented x 3. Normal mood.   Data Reviewed:        Latest Ref Rng & Units 07/06/2022   11:01 AM 07/05/2022   12:44 PM 04/24/2022    2:56 PM  CBC  WBC 4.0 - 10.5 K/uL 6.2  6.5  6.1   Hemoglobin 12.0 - 15.0 g/dL 19.1  47.8  29.5   Hematocrit 36.0 - 46.0 % 46.2  48.0  43.4   Platelets 150 - 400 K/uL 215  243  281       Latest Ref Rng & Units 07/06/2022   11:01 AM 07/05/2022   12:44 PM 04/24/2022    2:56 PM  CMP  Glucose 70 - 99 mg/dL 621  308  657   BUN 8 - 23 mg/dL 12  10  11    Creatinine 0.44 - 1.00 mg/dL 8.46  9.62  9.52   Sodium 135 - 145 mmol/L 141  140  141   Potassium 3.5 - 5.1 mmol/L 3.8  3.0  3.9   Chloride 98 - 111 mmol/L 100  98  99   CO2 22 - 32 mmol/L 30  23  34   Calcium 8.9 - 10.3 mg/dL 84.1  32.4  9.6   Total Protein 6.5 - 8.1 g/dL 6.4  6.5  5.9   Total Bilirubin 0.3 - 1.2 mg/dL 0.4  0.3  0.3   Alkaline Phos 38 - 126  U/L 38     AST 15 - 41 U/L 19  13  37   ALT 0 - 44 U/L 8  9  27     CXR = IMPRESSION: Prominent bilateral interstitial opacities could represent pulmonary venous congestion or atypical infection.  UA neg  Lactates 2.0 -> 1.5  Trop neg x2  Ammonia 14  Assessment and Plan: * Acute respiratory failure with hypoxia (HCC) DDx = atypical PNA vs CHF. Treating as atypical PNA for the moment since treatment down that path started by EDP. PNA pathway COVID and RVP pending Procalcitonin pending Empiric rocephin + azithromycin BNP pending Tele monitor Urine for S.Pneumo and legionella Got 1L IVF bolus in ED  Acute encephalopathy Most likely delirium secondary to respiratory process. Getting MRI to r/o stroke.  Nausea vomiting and diarrhea Possibly just related to acute viral illness / what ever is causing her PNA. DDx includes acute cholecystitis given the h/o gallstones seen in March, though with normal LFTs and no c/o abd pain, this seems a little less likely. IVF: 1L bolus in ED Zofran PRN Repeat CMP in AM  Acute pulmonary embolism (HCC) Pt with recent acute PE on 04/18/22. Continue Eliquis Tele monitor Worsening PE is in the differential for todays hypoxia admission: however, large PE with RHS seems a bit less likely today given Adherence to [facility] Neg trops x2 Positive CXR findings for PNA vs edema + rhonchi on respiratory exam in ED.  Hypothyroid Listed on chart but looks like pt on no thyroid meds per home med rec. TSH today is nl at 2.48  Type 2 diabetes mellitus treated with  insulin (HCC) Holding home metformin and lantus for the moment (looks like she takes lantus only "PRN" per med rec) Putting pt on mod scale SSI AC/HS for now.  Essential hypertension Cont home Imdur, Lopressor  Hyperlipidemia associated with type 2 diabetes mellitus (HCC) Cont crestor  OSA treated with BiPAP BIPAP QHS      Advance Care Planning:   Code Status: Full Code  Consults:  None  Family Communication: Family at bedside  Severity of Illness: The appropriate patient status for this patient is OBSERVATION. Observation status is judged to be reasonable and necessary in order to provide the required intensity of service to ensure the patient's safety. The patient's presenting symptoms, physical exam findings, and initial radiographic and laboratory data in the context of their medical condition is felt to place them at decreased risk for further clinical deterioration. Furthermore, it is anticipated that the patient will be medically stable for discharge from the hospital within 2 midnights of admission.   Author: Hillary Bow., DO 07/06/2022 8:45 PM  For on call review [email or web address].

## 2022-07-07 DIAGNOSIS — G9341 Metabolic encephalopathy: Secondary | ICD-10-CM | POA: Diagnosis present

## 2022-07-07 DIAGNOSIS — R197 Diarrhea, unspecified: Secondary | ICD-10-CM | POA: Diagnosis present

## 2022-07-07 DIAGNOSIS — R112 Nausea with vomiting, unspecified: Secondary | ICD-10-CM | POA: Diagnosis not present

## 2022-07-07 DIAGNOSIS — R0902 Hypoxemia: Secondary | ICD-10-CM | POA: Diagnosis present

## 2022-07-07 DIAGNOSIS — E1169 Type 2 diabetes mellitus with other specified complication: Secondary | ICD-10-CM | POA: Diagnosis present

## 2022-07-07 DIAGNOSIS — Z794 Long term (current) use of insulin: Secondary | ICD-10-CM | POA: Diagnosis not present

## 2022-07-07 DIAGNOSIS — I2699 Other pulmonary embolism without acute cor pulmonale: Secondary | ICD-10-CM | POA: Diagnosis not present

## 2022-07-07 DIAGNOSIS — E039 Hypothyroidism, unspecified: Secondary | ICD-10-CM | POA: Diagnosis present

## 2022-07-07 DIAGNOSIS — I2510 Atherosclerotic heart disease of native coronary artery without angina pectoris: Secondary | ICD-10-CM | POA: Diagnosis present

## 2022-07-07 DIAGNOSIS — K219 Gastro-esophageal reflux disease without esophagitis: Secondary | ICD-10-CM | POA: Diagnosis present

## 2022-07-07 DIAGNOSIS — G4733 Obstructive sleep apnea (adult) (pediatric): Secondary | ICD-10-CM | POA: Diagnosis present

## 2022-07-07 DIAGNOSIS — G9340 Encephalopathy, unspecified: Secondary | ICD-10-CM | POA: Diagnosis not present

## 2022-07-07 DIAGNOSIS — E785 Hyperlipidemia, unspecified: Secondary | ICD-10-CM | POA: Diagnosis present

## 2022-07-07 DIAGNOSIS — K828 Other specified diseases of gallbladder: Secondary | ICD-10-CM | POA: Diagnosis present

## 2022-07-07 DIAGNOSIS — Z1152 Encounter for screening for COVID-19: Secondary | ICD-10-CM | POA: Diagnosis not present

## 2022-07-07 DIAGNOSIS — Z7984 Long term (current) use of oral hypoglycemic drugs: Secondary | ICD-10-CM | POA: Diagnosis not present

## 2022-07-07 DIAGNOSIS — J189 Pneumonia, unspecified organism: Secondary | ICD-10-CM | POA: Diagnosis present

## 2022-07-07 DIAGNOSIS — Z79899 Other long term (current) drug therapy: Secondary | ICD-10-CM | POA: Diagnosis not present

## 2022-07-07 DIAGNOSIS — E876 Hypokalemia: Secondary | ICD-10-CM | POA: Diagnosis present

## 2022-07-07 DIAGNOSIS — Z8249 Family history of ischemic heart disease and other diseases of the circulatory system: Secondary | ICD-10-CM | POA: Diagnosis not present

## 2022-07-07 DIAGNOSIS — J440 Chronic obstructive pulmonary disease with acute lower respiratory infection: Secondary | ICD-10-CM | POA: Diagnosis present

## 2022-07-07 DIAGNOSIS — Z87891 Personal history of nicotine dependence: Secondary | ICD-10-CM | POA: Diagnosis not present

## 2022-07-07 DIAGNOSIS — J9601 Acute respiratory failure with hypoxia: Secondary | ICD-10-CM | POA: Diagnosis not present

## 2022-07-07 DIAGNOSIS — Z7901 Long term (current) use of anticoagulants: Secondary | ICD-10-CM | POA: Diagnosis not present

## 2022-07-07 DIAGNOSIS — K8020 Calculus of gallbladder without cholecystitis without obstruction: Secondary | ICD-10-CM | POA: Diagnosis present

## 2022-07-07 DIAGNOSIS — I10 Essential (primary) hypertension: Secondary | ICD-10-CM | POA: Diagnosis present

## 2022-07-07 DIAGNOSIS — Z825 Family history of asthma and other chronic lower respiratory diseases: Secondary | ICD-10-CM | POA: Diagnosis not present

## 2022-07-07 DIAGNOSIS — Z86711 Personal history of pulmonary embolism: Secondary | ICD-10-CM | POA: Diagnosis not present

## 2022-07-07 LAB — COMPREHENSIVE METABOLIC PANEL
ALT: 10 U/L (ref 0–44)
AST: 12 U/L — ABNORMAL LOW (ref 15–41)
Albumin: 2.9 g/dL — ABNORMAL LOW (ref 3.5–5.0)
Alkaline Phosphatase: 37 U/L — ABNORMAL LOW (ref 38–126)
Anion gap: 11 (ref 5–15)
BUN: 9 mg/dL (ref 8–23)
CO2: 26 mmol/L (ref 22–32)
Calcium: 9.8 mg/dL (ref 8.9–10.3)
Chloride: 104 mmol/L (ref 98–111)
Creatinine, Ser: 0.58 mg/dL (ref 0.44–1.00)
GFR, Estimated: >60 mL/min (ref >60)
Glucose, Bld: 125 mg/dL — ABNORMAL HIGH (ref 70–99)
Potassium: 2.5 mmol/L — CL (ref 3.5–5.1)
Sodium: 141 mmol/L (ref 135–145)
Total Bilirubin: 0.5 mg/dL (ref 0.3–1.2)
Total Protein: 5.6 g/dL — ABNORMAL LOW (ref 6.5–8.1)

## 2022-07-07 LAB — BASIC METABOLIC PANEL
Anion gap: 10 (ref 5–15)
BUN: 8 mg/dL (ref 8–23)
CO2: 27 mmol/L (ref 22–32)
Calcium: 9.6 mg/dL (ref 8.9–10.3)
Chloride: 104 mmol/L (ref 98–111)
Creatinine, Ser: 0.65 mg/dL (ref 0.44–1.00)
GFR, Estimated: >60 mL/min (ref >60)
Glucose, Bld: 141 mg/dL — ABNORMAL HIGH (ref 70–99)
Potassium: 2.6 mmol/L — CL (ref 3.5–5.1)
Sodium: 141 mmol/L (ref 135–145)

## 2022-07-07 LAB — GLUCOSE, CAPILLARY
Glucose-Capillary: 143 mg/dL — ABNORMAL HIGH (ref 70–99)
Glucose-Capillary: 170 mg/dL — ABNORMAL HIGH (ref 70–99)
Glucose-Capillary: 102 mg/dL — ABNORMAL HIGH (ref 70–99)
Glucose-Capillary: 163 mg/dL — ABNORMAL HIGH (ref 70–99)

## 2022-07-07 LAB — CULTURE, BLOOD (ROUTINE X 2): Special Requests: ADEQUATE

## 2022-07-07 LAB — MAGNESIUM: Magnesium: 1.7 mg/dL (ref 1.7–2.4)

## 2022-07-07 MED ORDER — POTASSIUM CHLORIDE 10 MEQ/100ML IV SOLN
10.0000 meq | INTRAVENOUS | Status: AC
  Administered 2022-07-07 (×4): 10 meq via INTRAVENOUS
  Filled 2022-07-07 (×4): qty 100

## 2022-07-07 MED ORDER — MAGNESIUM SULFATE 2 GM/50ML IV SOLN
2.0000 g | Freq: Once | INTRAVENOUS | Status: AC
  Administered 2022-07-07: 2 g via INTRAVENOUS
  Filled 2022-07-07: qty 50

## 2022-07-07 MED ORDER — POTASSIUM CHLORIDE CRYS ER 20 MEQ PO TBCR
40.0000 meq | EXTENDED_RELEASE_TABLET | Freq: Once | ORAL | Status: AC
  Administered 2022-07-07: 40 meq via ORAL
  Filled 2022-07-07: qty 2

## 2022-07-07 NOTE — Progress Notes (Signed)
Received call from lab. Potasium 2.8. Provider requested recollect to confirm levels. Awaiting lab redraw at this time.

## 2022-07-07 NOTE — Plan of Care (Signed)

## 2022-07-07 NOTE — Progress Notes (Signed)
PROGRESS NOTE    April [name]  ZOX:[id number] DOB: 1940/03/15 DOA: 07/06/2022 PCP: Donita Brooks, MD    Brief Narrative:  April [name] is a 82 y.o. female with past medical history of obstructive sleep apnea on BiPAP, diabetes mellitus type 2, COPD, recent pulmonary embolism March on Eliquis, history of cholelithiasis presented to hospital with intermittent confusion for few days with diarrhea and nausea and vomiting.  In the ED, patient was also noted to have hypoxia and required 2 L of oxygen by nasal cannula.  Chest x-ray done in the ED showed atypical pneumonia versus CHF.  Patient was started on antibiotics and was admitted hospital for further evaluation and treatment.    Assessment and plan.  Acute respiratory failure with hypoxia  Uncertain etiology.  Possible atypical pneumonia. COVID and respiratory viral panel negative.  Procalcitonin less than 0.1.  Currently on empiric Rocephin and Zithromax.  Urinary strep antigen negative.  Pending Legionella pneumobilia urinary antigen.  BNP elevated at 145.  Review of previous 2D echocardiogram from 04/20/2022 showed LV ejection fraction of 60 to 65% with no regional wall motion abnormality.  Blood cultures negative in less than 12 hours.  HIV screen was negative.  Patient received IV fluids and lactate has improved to 1.4.  Will continue to monitor today.  Will wean oxygen as able.  Avoid Topamax as per the patient's son's request.  Encourage deep breathing, incentive spirometry, ambulation today.  Hypokalemia. Give p.o. potassium x 1.  IV KCl 40 mEq.  Patient potassium very low at 2.6.  Check BM AM..  Received magnesium sulfate IV as well.  Acute metabolic encephalopathy  CT head scan was notable for age-indeterminate right thalamic lacunar infarct.  MRI of the brain was recommended which showed no acute intracranial abnormality and findings of small vessel ischemia.  Ammonia 14, TSH 2.4.  UA was negative for infection.  No leukocytosis.   Clinically improved as per the patient's son at bedside.  Nausea vomiting and diarrhea Has history of gallstones but no abdominal pain.  Could be secondary to viral illness side effect of new medication, family reports Topamax and symptoms after that..  Continue supportive care.  Ultrasound done 07/06/2022 showed cholelithiasis with gallbladder sludge no acute cholecystitis.  Discontinue Topamax.  No abdominal tenderness.  History of pulmonary embolism. Pt with recent acute PE on 04/18/22.  Continue Eliquis.  Negative troponins.  No chest pain.   Hypothyroid Not on medication.  TSH at 2.4.   Type 2 diabetes mellitus  Lantus and metformin on hold.  Continue sliding scale insulin.     Essential hypertension Cont home Imdur, Lopressor   Hyperlipidemia associated with type 2 diabetes mellitus  Continue Crestor    OSA treated with BiPAP Continue BiPAP at nighttime.  Generalized weakness.  Will get PT evaluation.        DVT prophylaxis:  apixaban (ELIQUIS) tablet 5 mg   Code Status:     Code Status: Full Code  Disposition: Likely home on 07/08/2022  Status is: Observation The patient will require care spanning > 2 midnights and should be moved to inpatient because: Pending clinical improvement, significant hypokalemia, on supplemental oxygen   Family Communication: Spoke with the patient's son at bedside  Consultants:  None  Procedures:  None  Antimicrobials:  Rocephin and Zithromax  Anti-infectives (From admission, onward)    Start     Dose/Rate Route Frequency Ordered Stop   07/07/22 1200  cefTRIAXone (ROCEPHIN) 2 g in sodium chloride 0.9 %  100 mL IVPB        2 g 200 mL/hr over 30 Minutes Intravenous Every 24 hours 07/06/22 1925 07/11/22 1159   07/07/22 1000  azithromycin (ZITHROMAX) 500 mg in sodium chloride 0.9 % 250 mL IVPB        500 mg 250 mL/hr over 60 Minutes Intravenous Every 24 hours 07/06/22 1925 07/11/22 0959   07/06/22 2030  cefTRIAXone (ROCEPHIN) 1 g in  sodium chloride 0.9 % 100 mL IVPB        1 g 200 mL/hr over 30 Minutes Intravenous  Once 07/06/22 1930 07/06/22 2120   07/06/22 1500  cefTRIAXone (ROCEPHIN) 1 g in sodium chloride 0.9 % 100 mL IVPB        1 g 200 mL/hr over 30 Minutes Intravenous  Once 07/06/22 1453 07/06/22 1550   07/06/22 1500  azithromycin (ZITHROMAX) 500 mg in sodium chloride 0.9 % 250 mL IVPB        500 mg 250 mL/hr over 60 Minutes Intravenous  Once 07/06/22 1453 07/06/22 1630      Subjective: Today, patient was seen and examined at bedside.  Patient more alert awake as per the patient's son at bedside.  Patient denies any dyspnea or increasing shortness of breath.  Patient's son states that she was started on Topamax which might have contributed to her symptoms.  Mild headache.  Objective: Vitals:   07/06/22 2230 07/07/22 0558 07/07/22 0832 07/07/22 0833  BP: (!) 137/49 (!) 118/47 (!) 140/64 (!) 140/64  Pulse: 83 60 69 69  Resp: 18 17  17   Temp: 98.1 F (36.7 C) 97.9 F (36.6 C)  98.3 F (36.8 C)  TempSrc: Oral Oral  Oral  SpO2: 98% 93%  95%  Weight:      Height:        Intake/Output Summary (Last 24 hours) at 07/07/2022 0935 Last data filed at 07/07/2022 0302 Gross per 24 hour  Intake 1198.52 ml  Output --  Net 1198.52 ml   Filed Weights   07/06/22 1016  Weight: 54.9 kg    Physical Examination: Body mass index is 22.87 kg/m.  General:  Average built, not in obvious distress, on nasal cannula oxygen.,  Elderly female, HENT:   No scleral pallor or icterus noted. Oral mucosa is moist.  Chest:.  Diminished breath sounds bilaterally. No crackles or wheezes.  CVS: S1 &S2 heard. No murmur.  Regular rate and rhythm. Abdomen: Soft, nontender, nondistended.  Bowel sounds are heard.   Extremities: No cyanosis, clubbing or edema.  Peripheral pulses are palpable. Psych: Alert, awake and oriented, normal mood CNS:  No cranial nerve deficits.  Generalized weakness noted. Skin: Warm and dry.  No rashes  noted.  Data Reviewed:   CBC: Recent Labs  Lab 07/05/22 1244 07/06/22 1101  WBC 6.5 6.2  NEUTROABS 3,530 3.4  HGB 15.6* 15.2*  HCT 48.0* 46.2*  MCV 89.4 89.7  PLT 243 215    Basic Metabolic Panel: Recent Labs  Lab 07/05/22 1244 07/06/22 1101 07/07/22 0214 07/07/22 0749  NA 140 141 141 141  K 3.0* 3.8 2.5* 2.6*  CL 98 100 104 104  CO2 23 30 26 27   GLUCOSE 192* 154* 125* 141*  BUN 10 12 9 8   CREATININE 0.72 0.59 0.58 0.65  CALCIUM 11.3* 11.3* 9.8 9.6  MG  --   --   --  1.7    Liver Function Tests: Recent Labs  Lab 07/05/22 1244 07/06/22 1101 07/07/22 0214  AST 13 19  12*  ALT 9 8 10   ALKPHOS  --  38 37*  BILITOT 0.3 0.4 0.5  PROT 6.5 6.4* 5.6*  ALBUMIN  --  3.9 2.9*     Radiology Studies: MR BRAIN WO CONTRAST  Result Date: 07/06/2022 CLINICAL DATA:  Altered mental status EXAM: MRI HEAD WITHOUT CONTRAST TECHNIQUE: Multiplanar, multiecho pulse sequences of the brain and surrounding structures were obtained without intravenous contrast. COMPARISON:  None Available. FINDINGS: Brain: No acute infarct, mass effect or extra-axial collection. No acute or chronic hemorrhage. There is multifocal hyperintense T2-weighted signal within the white matter. Parenchymal volume and CSF spaces are normal. The midline structures are normal. Vascular: Major flow voids are preserved. Skull and upper cervical spine: Normal calvarium and skull base. Visualized upper cervical spine and soft tissues are normal. Sinuses/Orbits:Left maxillary sinus opacification.  Normal orbits. IMPRESSION: 1. No acute intracranial abnormality. 2. Findings of chronic small vessel ischemia. Electronically Signed   By: Deatra Robinson M.D.   On: 07/06/2022 20:57   US Abdomen Limited RUQ (LIVER/GB)  Result Date: 07/06/2022 CLINICAL DATA:  Emesis EXAM: ULTRASOUND ABDOMEN LIMITED RIGHT UPPER QUADRANT COMPARISON:  04/18/2022 and CT scan of 04/18/2022 FINDINGS: Gallbladder: Numerous small layering gallstones. Sludge  noted. No sonographic Murphy sign noted by sonographer. Common bile duct: Diameter: 0.6 cm (within normal limits for age) Liver: No focal lesion identified. Within normal limits in parenchymal echogenicity. Portal vein is patent on color Doppler imaging with normal direction of blood flow towards the liver. Other: None. IMPRESSION: 1. Cholelithiasis and gallbladder sludge. No evidence of acute cholecystitis. Electronically Signed   By: Gaylyn Rong M.D.   On: 07/06/2022 12:21   DG Chest 2 View  Result Date: 07/06/2022 CLINICAL DATA:  Cough EXAM: CHEST - 2 VIEW COMPARISON:  CXR 04/18/22 FINDINGS: The heart size and mediastinal contours are within normal limits. No pleural effusion pneumothorax. No focal airspace opacity. There are prominent bilateral interstitial opacities that could represent pulmonary venous congestion or atypical infection. Redemonstrated severe compression deformity at L1. IMPRESSION: Prominent bilateral interstitial opacities could represent pulmonary venous congestion or atypical infection. Electronically Signed   By: Lorenza Cambridge M.D.   On: 07/06/2022 11:59   CT Head Wo Contrast  Result Date: 07/06/2022 CLINICAL DATA:  Mental status change, persistent or worsening. Confusion and mild headache. EXAM: CT HEAD WITHOUT CONTRAST TECHNIQUE: Contiguous axial images were obtained from the base of the skull through the vertex without intravenous contrast. RADIATION DOSE REDUCTION: This exam was performed according to the departmental dose-optimization program which includes automated exposure control, adjustment of the mA and/or kV according to patient size and/or use of iterative reconstruction technique. COMPARISON:  None Available. FINDINGS: Brain: No acute hemorrhage. Age-indeterminate lacunar infarct in the right thalamus (image 14 series 2). Gray-white differentiation is otherwise preserved. No hydrocephalus or extra-axial collection. No mass effect or midline shift. Vascular: No  hyperdense vessel or unexpected calcification. Skull: No calvarial fracture or suspicious bone lesion. Skull base is unremarkable. Sinuses/Orbits: Moderate left maxillary and left sphenoid sinus disease. Orbits are unremarkable. Other: None. IMPRESSION: 1. 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 Consider MRI of the brain without contrast for further characterization, if clinically indicated. 2. No acute hemorrhage. 3. Moderate left maxillary and left sphenoid sinus disease. Electronically Signed   By: Orvan Falconer M.D.   On: 07/06/2022 11:46      LOS: 0 days    Joycelyn Das, MD [facility] Available via Epic secure chat 7am-7pm After these hours, please refer to coverage provider  listed on [email or web address] 07/07/2022, 9:35 AM

## 2022-07-08 DIAGNOSIS — J9601 Acute respiratory failure with hypoxia: Secondary | ICD-10-CM | POA: Diagnosis not present

## 2022-07-08 LAB — BASIC METABOLIC PANEL
Anion gap: 7 (ref 5–15)
BUN: 10 mg/dL (ref 8–23)
CO2: 25 mmol/L (ref 22–32)
Calcium: 8.8 mg/dL — ABNORMAL LOW (ref 8.9–10.3)
Chloride: 105 mmol/L (ref 98–111)
Creatinine, Ser: 0.79 mg/dL (ref 0.44–1.00)
GFR, Estimated: >60 mL/min (ref >60)
Glucose, Bld: 257 mg/dL — ABNORMAL HIGH (ref 70–99)
Potassium: 3.5 mmol/L (ref 3.5–5.1)
Sodium: 137 mmol/L (ref 135–145)

## 2022-07-08 LAB — CBC
HCT: 40.6 % (ref 36.0–46.0)
Hemoglobin: 12.9 g/dL (ref 12.0–15.0)
MCH: 29.8 pg (ref 26.0–34.0)
MCHC: 31.8 g/dL (ref 30.0–36.0)
MCV: 93.8 fL (ref 80.0–100.0)
Platelets: 188 10*3/uL (ref 150–400)
RBC: 4.33 MIL/uL (ref 3.87–5.11)
RDW: 13.3 % (ref 11.5–15.5)
WBC: 6.6 10*3/uL (ref 4.0–10.5)
nRBC: 0 % (ref 0.0–0.2)

## 2022-07-08 LAB — MAGNESIUM: Magnesium: 2 mg/dL (ref 1.7–2.4)

## 2022-07-08 LAB — GLUCOSE, CAPILLARY
Glucose-Capillary: 156 mg/dL — ABNORMAL HIGH (ref 70–99)
Glucose-Capillary: 256 mg/dL — ABNORMAL HIGH (ref 70–99)
Glucose-Capillary: 98 mg/dL (ref 70–99)

## 2022-07-08 LAB — CULTURE, BLOOD (ROUTINE X 2)

## 2022-07-08 MED ORDER — POTASSIUM CHLORIDE 20 MEQ PO PACK
40.0000 meq | PACK | Freq: Once | ORAL | Status: AC
  Administered 2022-07-08: 40 meq via ORAL
  Filled 2022-07-08: qty 2

## 2022-07-08 MED ORDER — CEFDINIR 300 MG PO CAPS: 300.0000 mg | ORAL_CAPSULE | Freq: Two times a day (BID) | ORAL | 0 refills | Status: AC

## 2022-07-08 MED ORDER — AZITHROMYCIN 250 MG PO TABS
500.0000 mg | ORAL_TABLET | Freq: Every day | ORAL | Status: DC
  Filled 2022-07-08: qty 2

## 2022-07-08 MED ORDER — AZITHROMYCIN 500 MG PO TABS: 500.0000 mg | ORAL_TABLET | Freq: Every day | ORAL | 0 refills | Status: AC

## 2022-07-08 NOTE — Plan of Care (Signed)

## 2022-07-08 NOTE — Progress Notes (Signed)
Discharge instructions, medications, and follow-up appts reviewed with pt. prior to discharge. Pt. voices understanding of all instructions given. PIV in RAC & LFA removed without difficulty. Catheter tip intact and hemostasis achieved in expected timeframe. All belongings sent with pt. at time of discharge. Pt. discharged home in stable condition via personal vehicle at this time.

## 2022-07-08 NOTE — Evaluation (Signed)
Physical Therapy Evaluation Patient Details Name: April [name] MRN: [id number] DOB: Feb 24, 1940 Today's Date: 07/08/2022  History of Present Illness  Patient is 82 y.o. female presented to hospital with intermittent confusion for few days with diarrhea and nausea and vomiting.  In the ED, patient was also noted to have hypoxia and required 2 L of oxygen by nasal cannula.  Chest x-ray done in the ED showed atypical pneumonia versus CHF. PMH significant for obstructive sleep apnea on BiPAP, diabetes mellitus type 2, COPD, recent pulmonary embolism March on Eliquis, history of cholelithiasis.   Clinical Impression  April [name] is 82 y.o. female admitted with above HPI and diagnosis. Patient is currently limited by functional impairments below (see PT problem list). Patient lives alone and is independent with rollator for household mobility at baseline. She has family staying with her during the day and evenings. Currently she requires min guard/supervision for transfers and gait with rollator. Patient will benefit from continued skilled PT interventions to address impairments and progress independence with mobility. Acute PT will follow and progress as able.        Recommendations for follow up therapy are one component of a multi-disciplinary discharge planning process, led by the attending physician.  Recommendations may be updated based on patient status, additional functional criteria and insurance authorization.  Follow Up Recommendations       Assistance Recommended at Discharge Intermittent Supervision/Assistance  Patient can return home with the following  A little help with walking and/or transfers;A little help with bathing/dressing/bathroom;Direct supervision/assist for medications management;Assist for transportation;Help with stairs or ramp for entrance    Equipment Recommendations None recommended by PT  Recommendations for Other Services       Functional Status Assessment  Patient has had a recent decline in their functional status and demonstrates the ability to make significant improvements in function in a reasonable and predictable amount of time.     Precautions / Restrictions Precautions Precautions: Fall Restrictions Weight Bearing Restrictions: No      Mobility  Bed Mobility Overal bed mobility: Modified Independent                  Transfers Overall transfer level: Modified independent Equipment used: Rollator (4 wheels)               General transfer comment: steady with sit<>stand    Ambulation/Gait Ambulation/Gait assistance: Supervision Gait Distance (Feet): 200 Feet Assistive device: Rollator (4 wheels) Gait Pattern/deviations: Step-through pattern, Decreased stride length Gait velocity: fair     General Gait Details: pt with good/safe management of rollator for ambulation in hallway. no LOB noted throughout. VSS throughout. able to ambulate short distance in room with no AD to move bed>chair.  Stairs            Wheelchair Mobility    Modified Rankin (Stroke Patients Only)       Balance Overall balance assessment: Needs assistance Sitting-balance support: Feet supported Sitting balance-Leahy Scale: Good     Standing balance support: During functional activity, Reliant on assistive device for balance, Bilateral upper extremity supported Standing balance-Leahy Scale: Fair                               Pertinent Vitals/Pain Pain Assessment Pain Assessment: No/denies pain    Home Living Family/patient expects to be discharged to:: Private residence Living Arrangements: Alone Available Help at Discharge: Family;Available 24 hours/day Type of Home: Mobile home Home  Access: Ramped entrance       Home Layout: One level Home Equipment: Rollator (4 wheels);Cane - single point;Shower seat Additional Comments: pt was getting HHPT/OT PTA and feels ready to transition to OPPT    Prior  Function Prior Level of Function : Needs assist             Mobility Comments: amb with rollator and has been progressing to [facility] with HH ADLs Comments: Family assists with iADL, medications, yard work and community mobility.  Patient able to complete light meal prep and ADL's. pt's granddaughter assists with bathing but pt dresses self     Hand Dominance   Dominant Hand: Right    Extremity/Trunk Assessment   Upper Extremity Assessment Upper Extremity Assessment: Overall WFL for tasks assessed    Lower Extremity Assessment Lower Extremity Assessment: Overall WFL for tasks assessed    Cervical / Trunk Assessment Cervical / Trunk Assessment: Normal  Communication   Communication: HOH  Cognition Arousal/Alertness: Awake/alert Behavior During Therapy: WFL for tasks assessed/performed Overall Cognitive Status: Within Functional Limits for tasks assessed                                          General Comments      Exercises     Assessment/Plan    PT Assessment Patient needs continued PT services  PT Problem List Decreased strength;Decreased activity tolerance;Decreased balance;Decreased mobility;Decreased knowledge of use of DME;Decreased safety awareness       PT Treatment Interventions DME instruction;Gait training;Stair training;Functional mobility training;Therapeutic activities;Therapeutic exercise;Balance training;Patient/family education    PT Goals (Current goals can be found in the Care Plan section)  Acute Rehab PT Goals Patient Stated Goal: return home and get strong PT Goal Formulation: With patient Time For Goal Achievement: 07/22/22 Potential to Achieve Goals: Good    Frequency Min 3X/week     Co-evaluation               AM-PAC PT "6 Clicks" Mobility  Outcome Measure Help needed turning from your back to your side while in a flat bed without using bedrails?: None Help needed moving from lying on your back to sitting on  the side of a flat bed without using bedrails?: None Help needed moving to and from a bed to a chair (including a wheelchair)?: None Help needed standing up from a chair using your arms (e.g., wheelchair or bedside chair)?: None Help needed to walk in hospital room?: A Little Help needed climbing 3-5 steps with a railing? : A Little 6 Click Score: 22    End of Session Equipment Utilized During Treatment: Gait belt Activity Tolerance: Patient tolerated treatment well Patient left: in chair;with call bell/phone within reach;with family/visitor present Nurse Communication: Mobility status PT Visit Diagnosis: Muscle weakness (generalized) (M62.81);Difficulty in walking, not elsewhere classified (R26.2);Other abnormalities of gait and mobility (R26.89)    Time: 7829-5621 PT Time Calculation (min) (ACUTE ONLY): 20 min   Charges:   PT Evaluation $PT Eval Low Complexity: 1 Low          Wynn Maudlin, DPT Acute Rehabilitation Services Office [phone]  07/08/22 3:13 PM

## 2022-07-08 NOTE — Discharge Summary (Signed)
Physician Discharge Summary  Patient ID: April [name] MRN: [id number] DOB/AGE: 81-Apr-1942 81 y.o.  Admit date: 07/06/2022 Discharge date: 07/08/2022  Admission Diagnoses:  Discharge Diagnoses:  Principal Problem:   Acute respiratory failure with hypoxia (HCC) Active Problems:   OSA treated with BiPAP   Hyperlipidemia associated with type 2 diabetes mellitus (HCC)   Essential hypertension   Type 2 diabetes mellitus treated with insulin (HCC)   Hypothyroid   Acute pulmonary embolism (HCC)   Acute encephalopathy   Nausea vomiting and diarrhea   Acute metabolic encephalopathy   Discharged Condition: stable  Hospital Course:  Patient is an 82 year old female with past medical history significant for obstructive sleep apnea on BiPAP, diabetes mellitus type 2, COPD, recent pulmonary embolism March on Eliquis and history of cholelithiasis.  Patient presented with intermittent confusion for few days, diarrhea and nausea and vomiting.  In the ED, patient was also noted to have hypoxia and required 2 L of oxygen by nasal cannula.  Chest x-ray done in the ED showed atypical pneumonia versus CHF.  Patient was started on antibiotics and was admitted hospital for further evaluation and treatment.  Patient was treated with IV azithromycin and ceftriaxone.  Symptoms have resolved significantly.  Patient's 2 sons confirm that patient is back to baseline.  Patient is eager to be discharged back home today.  Patient will be discharged on oral antibiotics.  Patient follow-up primary care provider within 1 week of discharge.  Acute respiratory failure with hypoxia  -Uncertain etiology.  Possible atypical pneumonia -COVID and respiratory viral panel came back negative.   -Procalcitonin was less than 0.1.   -Treated with IV Rocephin and Zithromax and supplemental oxygen. -Patient is off of supplemental oxygen at the moment. -Patient will be discharged back home on oral antibiotics.   - Urinary strep  antigen negative.   -BNP elevated at 145.   -Previous echocardiogram revealed: LV ejection fraction of 60 to 65% with no regional wall motion abnormality.   -Blood cultures so far have been negative. -HIV screening was negative.   -Oxygen as needed.  Apparently, patient was on oxygen with activity at home.   -Avoid Topamax as per the patient's son's request.    Hypokalemia. Give p.o. potassium x 1.  IV KCl 40 mEq.  Patient potassium very low at 2.6.  Check BM AM..  Received magnesium sulfate IV as well. -Potassium prior to discharge was 3.5.  Will still give patient KCl 40 mEq oral x 1 dose prior to discharge.   Acute metabolic encephalopathy  CT head scan was notable for age-indeterminate right thalamic lacunar infarct.  MRI of the brain was recommended which showed no acute intracranial abnormality and findings of small vessel ischemia.  Ammonia 14, TSH 2.4.  UA was negative for infection.  No leukocytosis.  Clinically improved as per the patient's son at bedside.   Nausea vomiting and diarrhea Has history of gallstones but no abdominal pain.  Could be secondary to viral illness side effect of new medication, family reports Topamax and symptoms after that..  Continue supportive care.  Ultrasound done 07/06/2022 showed cholelithiasis with gallbladder sludge no acute cholecystitis.  Discontinue Topamax.  No abdominal tenderness.   History of pulmonary embolism. Pt with recent acute PE on 04/18/22.  Continue Eliquis.  Negative troponins.  No chest pain.   Hypothyroid Not on medication.  TSH at 2.4.   Type 2 diabetes mellitus  Lantus and metformin on hold.  Continue sliding scale insulin.  Essential hypertension Cont home Imdur, Lopressor   Hyperlipidemia associated with type 2 diabetes mellitus  Continue Crestor    OSA treated with BiPAP Continue BiPAP at nighttime.   Consults: None  Significant Diagnostic Studies:  MRI brain revealed: 1. No acute intracranial abnormality. 2.  Findings of chronic small vessel ischemia.  Limited right upper quadrant abdominal ultrasound revealed:  1. Cholelithiasis and gallbladder sludge. No evidence of acute cholecystitis.  Chest x-ray revealed: Prominent bilateral interstitial opacities could represent pulmonary venous congestion or atypical infection.   Discharge Exam: Blood pressure (!) 142/77, pulse 73, temperature 97.7 F (36.5 C), temperature source Oral, resp. rate 17, height 5\' 1"  (1.549 m), weight 54.9 kg, SpO2 96 %.   Disposition: Discharge disposition: 01-Home or Self Care       Discharge Instructions     Diet - low sodium heart healthy   Complete by: As directed    Increase activity slowly   Complete by: As directed       Allergies as of 07/08/2022       Reactions   Neomycin Other (See Comments)   Visual disturbance and swelling in eyes        Medication List     STOP taking these medications    albuterol 108 (90 Base) MCG/ACT inhaler Commonly known as: VENTOLIN HFA   ondansetron 4 MG disintegrating tablet Commonly known as: ZOFRAN-ODT   promethazine-dextromethorphan 6.25-15 MG/5ML syrup Commonly known as: PROMETHAZINE-DM   topiramate 25 MG tablet Commonly known as: Topamax   torsemide 20 MG tablet Commonly known as: DEMADEX   traMADol 50 MG tablet Commonly known as: ULTRAM       TAKE these medications    apixaban 5 MG Tabs tablet Commonly known as: ELIQUIS Take 1 tablet (5 mg total) by mouth 2 (two) times daily.   azithromycin 500 MG tablet Commonly known as: Zithromax Take 1 tablet (500 mg total) by mouth daily for 3 days.   calcitonin (salmon) 200 UNIT/ACT nasal spray Commonly known as: MIACALCIN/FORTICAL Place into alternate nostrils.   CALCIUM 600 + D PO Take 1 tablet by mouth daily.   cefdinir 300 MG capsule Commonly known as: OMNICEF Take 1 capsule (300 mg total) by mouth 2 (two) times daily for 7 days.   escitalopram 10 MG tablet Commonly known as:  Lexapro Take 1 tablet (10 mg total) by mouth daily.   Fish Oil 1000 MG Caps Take 1 capsule by mouth daily.   gabapentin 600 MG tablet Commonly known as: NEURONTIN TAKE 1 TABLET BY MOUTH 4 TIMES DAILY What changed: when to take this   isosorbide mononitrate 60 MG 24 hr tablet Commonly known as: IMDUR TAKE 1 TABLET BY MOUTH ONCE DAILY   Lantus SoloStar 100 UNIT/ML Solostar Pen Generic drug: insulin glargine INJECT 40-50 UNITS UNDER THE SKIN ONCE EVERY NIGHT AT BEDTIME.   metFORMIN 500 MG tablet Commonly known as: GLUCOPHAGE TAKE 1 TABLET BY MOUTH TWICE (2) DAILY WITH A MEAL   metoprolol tartrate 25 MG tablet Commonly known as: LOPRESSOR TAKE ONE TABLET BY MOUTH TWICE A DAY   omeprazole 20 MG capsule Commonly known as: PRILOSEC Take 20 mg by mouth daily.   OneTouch Verio test strip Generic drug: glucose blood USE AS DIRECTED TO MONITOR BLOOD SUGARS UP TO 3 TIMES DAILY AS NEEDED   rosuvastatin 40 MG tablet Commonly known as: CRESTOR TAKE ONE TABLET BY MOUTH ONCE DAILY        Time spent: 36 minutes.  Signed: Lafayette Dragon  Kennya Schwenn 07/08/2022, 2:48 PM

## 2022-07-08 NOTE — Plan of Care (Signed)
Problem: Education: Goal: Knowledge of General Education information will improve Description: Including pain rating scale, medication(s)/side effects and non-pharmacologic comfort measures 07/08/2022 1552 by Weyman Pedro, RN Outcome: Adequate for Discharge 07/08/2022 1152 by Weyman Pedro, RN Outcome: Progressing   Problem: Health Behavior/Discharge Planning: Goal: Ability to manage health-related needs will improve 07/08/2022 1552 by Weyman Pedro, RN Outcome: Adequate for Discharge 07/08/2022 1152 by Weyman Pedro, RN Outcome: Progressing   Problem: Clinical Measurements: Goal: Ability to maintain clinical measurements within normal limits will improve 07/08/2022 1552 by Weyman Pedro, RN Outcome: Adequate for Discharge 07/08/2022 1152 by Weyman Pedro, RN Outcome: Progressing Goal: Will remain free from infection 07/08/2022 1552 by Weyman Pedro, RN Outcome: Adequate for Discharge 07/08/2022 1152 by Weyman Pedro, RN Outcome: Progressing Goal: Diagnostic test results will improve 07/08/2022 1552 by Weyman Pedro, RN Outcome: Adequate for Discharge 07/08/2022 1152 by Weyman Pedro, RN Outcome: Progressing Goal: Respiratory complications will improve 07/08/2022 1552 by Weyman Pedro, RN Outcome: Adequate for Discharge 07/08/2022 1152 by Weyman Pedro, RN Outcome: Progressing Goal: Cardiovascular complication will be avoided 07/08/2022 1552 by Weyman Pedro, RN Outcome: Adequate for Discharge 07/08/2022 1152 by Weyman Pedro, RN Outcome: Progressing   Problem: Activity: Goal: Risk for activity intolerance will decrease 07/08/2022 1552 by Weyman Pedro, RN Outcome: Adequate for Discharge 07/08/2022 1152 by Weyman Pedro, RN Outcome: Progressing   Problem: Nutrition: Goal: Adequate nutrition will be maintained 07/08/2022 1552 by Weyman Pedro, RN Outcome: Adequate for Discharge 07/08/2022 1152 by  Weyman Pedro, RN Outcome: Progressing   Problem: Coping: Goal: Level of anxiety will decrease 07/08/2022 1552 by Weyman Pedro, RN Outcome: Adequate for Discharge 07/08/2022 1152 by Weyman Pedro, RN Outcome: Progressing   Problem: Elimination: Goal: Will not experience complications related to bowel motility 07/08/2022 1552 by Weyman Pedro, RN Outcome: Adequate for Discharge 07/08/2022 1152 by Weyman Pedro, RN Outcome: Progressing Goal: Will not experience complications related to urinary retention 07/08/2022 1552 by Weyman Pedro, RN Outcome: Adequate for Discharge 07/08/2022 1152 by Weyman Pedro, RN Outcome: Progressing   Problem: Pain Managment: Goal: General experience of comfort will improve 07/08/2022 1552 by Weyman Pedro, RN Outcome: Adequate for Discharge 07/08/2022 1152 by Weyman Pedro, RN Outcome: Progressing   Problem: Safety: Goal: Ability to remain free from injury will improve 07/08/2022 1552 by Weyman Pedro, RN Outcome: Adequate for Discharge 07/08/2022 1152 by Weyman Pedro, RN Outcome: Progressing   Problem: Skin Integrity: Goal: Risk for impaired skin integrity will decrease 07/08/2022 1552 by Weyman Pedro, RN Outcome: Adequate for Discharge 07/08/2022 1152 by Weyman Pedro, RN Outcome: Progressing   Problem: Education: Goal: Ability to describe self-care measures that may prevent or decrease complications (Diabetes Survival Skills Education) will improve 07/08/2022 1552 by Weyman Pedro, RN Outcome: Adequate for Discharge 07/08/2022 1152 by Weyman Pedro, RN Outcome: Progressing Goal: Individualized Educational Video(s) 07/08/2022 1552 by Weyman Pedro, RN Outcome: Adequate for Discharge 07/08/2022 1152 by Weyman Pedro, RN Outcome: Progressing   Problem: Coping: Goal: Ability to adjust to condition or change in health will improve 07/08/2022 1552 by Weyman Pedro, RN Outcome: Adequate for Discharge 07/08/2022 1152 by Weyman Pedro, RN Outcome: Progressing   Problem: Fluid Volume: Goal: Ability to maintain a balanced intake and output will improve 07/08/2022 1552 by Weyman Pedro, RN Outcome: Adequate for Discharge 07/08/2022 1152 by Laural Benes,  Modena Nunnery, RN Outcome: Progressing   Problem: Health Behavior/Discharge Planning: Goal: Ability to identify and utilize available resources and services will improve 07/08/2022 1552 by Weyman Pedro, RN Outcome: Adequate for Discharge 07/08/2022 1152 by Weyman Pedro, RN Outcome: Progressing Goal: Ability to manage health-related needs will improve 07/08/2022 1552 by Weyman Pedro, RN Outcome: Adequate for Discharge 07/08/2022 1152 by Weyman Pedro, RN Outcome: Progressing   Problem: Metabolic: Goal: Ability to maintain appropriate glucose levels will improve 07/08/2022 1552 by Weyman Pedro, RN Outcome: Adequate for Discharge 07/08/2022 1152 by Weyman Pedro, RN Outcome: Progressing   Problem: Nutritional: Goal: Maintenance of adequate nutrition will improve 07/08/2022 1552 by Weyman Pedro, RN Outcome: Adequate for Discharge 07/08/2022 1152 by Weyman Pedro, RN Outcome: Progressing Goal: Progress toward achieving an optimal weight will improve 07/08/2022 1552 by Weyman Pedro, RN Outcome: Adequate for Discharge 07/08/2022 1152 by Weyman Pedro, RN Outcome: Progressing   Problem: Skin Integrity: Goal: Risk for impaired skin integrity will decrease 07/08/2022 1552 by Weyman Pedro, RN Outcome: Adequate for Discharge 07/08/2022 1152 by Weyman Pedro, RN Outcome: Progressing   Problem: Tissue Perfusion: Goal: Adequacy of tissue perfusion will improve 07/08/2022 1552 by Weyman Pedro, RN Outcome: Adequate for Discharge 07/08/2022 1152 by Weyman Pedro, RN Outcome: Progressing

## 2022-07-09 LAB — CULTURE, BLOOD (ROUTINE X 2)

## 2022-07-10 DIAGNOSIS — G4733 Obstructive sleep apnea (adult) (pediatric): Secondary | ICD-10-CM | POA: Diagnosis not present

## 2022-07-10 LAB — LEGIONELLA PNEUMOPHILA SEROGP 1 UR AG: L. pneumophila Serogp 1 Ur Ag: NEGATIVE

## 2022-07-10 LAB — CULTURE, BLOOD (ROUTINE X 2): Culture: NO GROWTH

## 2022-07-11 ENCOUNTER — Encounter: Payer: Self-pay | Admitting: *Deleted

## 2022-07-11 ENCOUNTER — Ambulatory Visit (INDEPENDENT_AMBULATORY_CARE_PROVIDER_SITE_OTHER): Payer: Medicare Other | Admitting: Family Medicine

## 2022-07-11 ENCOUNTER — Telehealth: Payer: Self-pay | Admitting: *Deleted

## 2022-07-11 ENCOUNTER — Encounter: Payer: Self-pay | Admitting: Family Medicine

## 2022-07-11 VITALS — BP 124/72 | HR 69 | Temp 98.7°F | Ht 61.0 in | Wt 124.0 lb

## 2022-07-11 DIAGNOSIS — R4182 Altered mental status, unspecified: Secondary | ICD-10-CM

## 2022-07-11 DIAGNOSIS — R519 Headache, unspecified: Secondary | ICD-10-CM | POA: Diagnosis not present

## 2022-07-11 LAB — CBC WITH DIFFERENTIAL/PLATELET
Basophils Absolute: 29 cells/uL (ref 0–200)
Lymphs Abs: 2384 cells/uL (ref 850–3900)
MCH: 28.9 pg (ref 27.0–33.0)
MPV: 10.9 fL (ref 7.5–12.5)
RBC: 5.02 10*6/uL (ref 3.80–5.10)

## 2022-07-11 LAB — CULTURE, BLOOD (ROUTINE X 2)
Culture: NO GROWTH
Special Requests: ADEQUATE

## 2022-07-11 NOTE — Transitions of Care (Post Inpatient/ED Visit) (Signed)
07/11/2022  Name: April [name] MRN: [id number] DOB: 17-Oct-1940  Today's TOC FU Call Status: Today's TOC FU Call Status:: Successful TOC FU Call Competed TOC FU Call Complete Date: 07/11/22  Transition Care Management Follow-up Telephone Call Date of Discharge: 07/08/22 Discharge Facility: [facility]) Type of Discharge: Inpatient Admission Primary Inpatient Discharge Diagnosis:: Acute respiratory failure with hypoxia How have you been since you were released from the hospital?: Better Any questions or concerns?: No  Items Reviewed: Did you receive and understand the discharge instructions provided?: Yes Medications obtained,verified, and reconciled?: Yes (Medications Reviewed) (picking up omnicef and zithromax today) Any new allergies since your discharge?: No Dietary orders reviewed?: Yes Type of Diet Ordered:: Heart healthy Do you have support at home?: Yes People in Home: child(ren), adult Name of Support/Comfort Primary Source: Rev[employer] Standard  Medications Reviewed Today: Medications Reviewed Today     Reviewed by Gwenith Daily, RN (Registered Nurse) on 07/11/22 at 1634  Med List Status: <None>   Medication Order Taking? Sig Documenting Provider Last Dose Status Informant  apixaban (ELIQUIS) 5 MG TABS tablet [id number] Yes Take 1 tablet (5 mg total) by mouth 2 (two) times daily. Donita Brooks, MD Taking Active Family Member  azithromycin [facility]) 500 MG tablet [id number] No: picking up from pharm today Take 1 tablet (500 mg total) by mouth daily for 3 days.  Patient not taking: Reported on 07/11/2022   Barnetta Chapel, MD Not Taking Active            Med Note Mauricio Po Jul 11, 2022  4:34 PM) Picking up at pharmacy today  calcitonin, salmon, (MIACALCIN/FORTICAL) 200 UNIT/ACT nasal spray [id number] Yes Place into alternate nostrils. [provider] Taking Active Family Member           Med Note Penni Bombard   Thu Jul 06, 2022  1:09 PM)  nightly  Calcium Carbonate-Vitamin D (CALCIUM 600 + D PO) [id number] Yes Take 1 tablet by mouth daily. [provider] Taking Active Family Member           Med Note Merlene Morse, Kathreen Cosier   Thu Jul 06, 2022  1:10 PM) nightly  cefdinir (OMNICEF) 300 MG capsule [id number] No: picking up from pharm today Take 1 capsule (300 mg total) by mouth 2 (two) times daily for 7 days.  Patient not taking: Reported on 07/11/2022   Barnetta Chapel, MD Not Taking Active            Med Note Mauricio Po Jul 11, 2022  4:34 PM) Picking up at pharmacy today  escitalopram (LEXAPRO) 10 MG tablet [id number] Yes Take 1 tablet (10 mg total) by mouth daily. Donita Brooks, MD Taking Active Family Member           Med Note Merlene Morse, Kathreen Cosier   Thu Jul 06, 2022  1:10 PM) nightly  gabapentin (NEURONTIN) 600 MG tablet [id number] Yes TAKE 1 TABLET BY MOUTH 4 TIMES DAILY  Patient taking differently: Take 600 mg by mouth 2 (two) times daily.   Donita Brooks, MD Taking Active Family Member  isosorbide mononitrate (IMDUR) 60 MG 24 hr tablet [id number] Yes TAKE 1 TABLET BY MOUTH ONCE DAILY Marykay Lex, MD Taking Active Family Member           Med Note Pleas Koch Jul 06, 2022  1:11 PM) nightly  LANTUS SOLOSTAR 100 UNIT/ML Solostar Pen [id number] Yes  INJECT 40-50 UNITS UNDER THE SKIN ONCE EVERY NIGHT AT BEDTIME. Donita Brooks, MD Taking Active Family Member           Med Note Penni Bombard   Thu Jul 06, 2022  1:12 PM) prn  metFORMIN (GLUCOPHAGE) 500 MG tablet [id number] Yes TAKE 1 TABLET BY MOUTH TWICE (2) DAILY WITH A MEAL Donita Brooks, MD Taking Active Family Member  metoprolol tartrate (LOPRESSOR) 25 MG tablet [id number] Yes TAKE ONE TABLET BY MOUTH TWICE A DAY Marykay Lex, MD Taking Active Family Member  Omega-3 Fatty Acids (FISH OIL) 1000 MG CAPS [id number] Yes Take 1 capsule by mouth daily. [provider] Taking Active Family Member           Med  Note Penni Bombard   Thu Jul 06, 2022  1:13 PM) qam  omeprazole (PRILOSEC) 20 MG capsule [id number] Yes Take 20 mg by mouth daily. [provider] Taking Active Family Member           Med Note Pleas Koch Jul 06, 2022  1:13 PM) nightly  ONETOUCH VERIO test strip [id number] Yes USE AS DIRECTED TO MONITOR BLOOD SUGARS UP TO 3 TIMES DAILY AS NEEDED Donita Brooks, MD Taking Active Family Member  rosuvastatin (CRESTOR) 40 MG tablet [id number] Yes TAKE ONE TABLET BY MOUTH ONCE DAILY Marykay Lex, MD Taking Active Family Member           Med Note Penni Bombard   Thu Jul 06, 2022  1:15 PM) qam            Home Care and Equipment/Supplies: Were Home Health Services Ordered?: No Any new equipment or medical supplies ordered?: No  Functional Questionnaire: Do you need assistance with bathing/showering or dressing?: No Do you need assistance with meal preparation?: No Do you need assistance with eating?: No Do you have difficulty maintaining continence: No Do you need assistance with getting out of bed/getting out of a chair/moving?: No Do you have difficulty managing or taking your medications?: No  Follow up appointments reviewed: PCP Follow-up appointment confirmed?: Yes Date of PCP follow-up appointment?: 07/11/22 Follow-up Provider: [facility] Follow-up appointment confirmed?: NA Do you need transportation to your follow-up appointment?: No Do you understand care options if your condition(s) worsen?: Yes-patient verbalized understanding  SDOH Interventions Today    Flowsheet Row Most Recent Value  SDOH Interventions   Transportation Interventions Patient Resources (Friends/Family)  Financial Strain Interventions Intervention Not Indicated      TOC Interventions Today    Flowsheet Row Most Recent Value  TOC Interventions   TOC Interventions Discussed/Reviewed TOC Interventions Discussed, TOC Interventions  Reviewed, Post discharge activity limitations per provider       Demetrios Loll, BSN, RN-BC RN Care Coordinator [facility]  [facility] Direct Dial: [phone] Main #: [phone]

## 2022-07-11 NOTE — Progress Notes (Signed)
Subjective:    Patient ID: April [name], female    DOB: 1940-02-15, 82 y.o.   MRN: [id number]  05/18/22 Patient reports a 1 week history of a headache.  It is isolated to the left side of her head.  It seems to originate her left occiput and radiate up into her left temple over her left parietal lobe.  It sharp and intense.  She does have some tenderness to palpation in her left side of her neck and on the left occiput.  There are no vesicles or rash to suggest any shingles.  She denies any pain with range of motion in her neck.  The headache is pulsatile.  There is no neurologic deficit.  Cranial nerves II through XII are grossly intact and muscle strength is 5/5 and equal and symmetric in the upper and lower extremities.  She denies any unilateral lacrimation or rhinorrhea to suggest a cluster headache or hemicrania continua.  She has no history of migraines.  She denies any falls where she hit her head or anything that would make [place] worry about intercranial bleed.  This seems to be more neuropathic in nature.  There are no strokelike symptoms and there is no neurologic deficit.  There is no altered mental status.  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  I doubt any type of vascular headache given how good she looks today.  Begin prednisone for possible nerve impingement in the neck.  Reassess in 1 week.  Proceed with neuroimaging if headache worsens or if the patient develops any neurologic symptoms.  Seek medical attention immediately if she develops any strokelike symptoms or worsening headache..  Physical exam today shows no evidence of temporal arteritis  06/30/22  Patient continues to have a unilateral nervelike headache on the left side of her head.  It begins in her left occiput that radiates above her left ear over her left temple.  It feels very superficial to the patient.   She states that if cold air touches her skin such as a fan or air conditioning, it elicits the pain.  It is an aching constant pain.  There is no neurologic deficit associated with it.  There is no blurred vision.  There is no slurred speech.  She denies any pain palpating the temporal artery.  She denies any pain with chewing.  At that time, my plan was: Has seems to pathic nature.  She is already on gabapentin.  Will hold gabapentin and start Topamax 25 mg twice daily and uptitrate to 50 mg twice daily over the next few weeks.  If the headache improves we will continue Topamax.  I am also going to consult neurology for possible trigger point injections versus Botox injections.  I do not see any indication to support an MRI.  The headache seems very superficial especially given the fact that cold air elicits the headache  07/11/22 Shortly after starting the Topamax, the patient became more confused.  Then she developed nausea and vomiting.  This prompted her family to take her to the emergency room.  CT scan showed possible pulmonary infarct however MRI revealed no abnormalities.  Chest x-ray showed bilateral opacities concerning for multifocal atypical infection.  She was started on antibiotics.  She also discontinued the Topamax.  After stopping the Topamax and starting the antibiotics, her mental status improved.  She is  back to her baseline.  She denies any shortness of breath or chest pain or cough or fever or chills however she still having a left-sided daily headache every morning Past Medical History:  Diagnosis Date   Abnormal weight gain    Acute diverticulitis 05/13/2021   Arthritis    "joints" (06/13/2013)   COPD (chronic obstructive pulmonary disease) (HCC)    Coronary artery disease, non-occlusive 06/13/2013   Cardiac Cath: sharp Angle take-off of Large Dominant Cx: ~70-80% mid Cx bifurcation lesion (Lateral OM &  AVGCx-PL-PDA both with hairpin ostial & ~50% lesions) - Not PCI amenable due  to vessel tortuosity; ~40-50% mid LAD;Ramus - no significnat diseaes; small non-dominant RCA   Diverticulosis    GERD (gastroesophageal reflux disease)    History of stress test    a. 09/2006 nl Dobutamine Echo   Hyperlipidemia    Hypertension    NIDDM (non-insulin dependent diabetes mellitus)    On home oxygen therapy    "2L q hs; runs into my BIPAP" (06/13/2013)   OSA (obstructive sleep apnea)    "BIPAP w/O2" (06/13/2013)   Tracheobronchomalacia    a. followed by [facility]   Past Surgical History:  Procedure Laterality Date   CARDIAC CATHETERIZATION  06/2013   Cardiac Cath: sharp Angle take-off of Large Dominant Cx: ~70-80% mid Cx bifurcation lesion (Lateral OM &  AVGCx-PL-PDA both with hairpin ostial & ~50% lesions) - Not PCI amenable due to vessel tortuosity; ~40-50% mid LAD;Ramus - no significnat diseaes; small non-dominant RCA   LEFT HEART CATHETERIZATION WITH CORONARY ANGIOGRAM N/A 06/16/2013   Procedure: LEFT HEART CATHETERIZATION WITH CORONARY ANGIOGRAM;  Surgeon: Marykay Lex, MD;  Location: [facility] CATH LAB;  Service: Cardiovascular;  Laterality: N/A;   TRANSTHORACIC ECHOCARDIOGRAM  06/17/2013   Normal LV size and function. EF 55-60% with no regional WMA. Grade 1 diastolic dysfunction. No significant valvular lesions   TUBAL LIGATION     Current Outpatient Medications on File Prior to Visit  Medication Sig Dispense Refill   apixaban (ELIQUIS) 5 MG TABS tablet Take 1 tablet (5 mg total) by mouth 2 (two) times daily. 180 tablet 1   azithromycin (ZITHROMAX) 500 MG tablet Take 1 tablet (500 mg total) by mouth daily for 3 days. 3 tablet 0   calcitonin, salmon, (MIACALCIN/FORTICAL) 200 UNIT/ACT nasal spray Place into alternate nostrils.     Calcium Carbonate-Vitamin D (CALCIUM 600 + D PO) Take 1 tablet by mouth daily.     cefdinir (OMNICEF) 300 MG capsule Take 1 capsule (300 mg total) by mouth 2 (two) times daily for 7 days. 14 capsule 0   escitalopram (LEXAPRO) 10 MG tablet Take 1  tablet (10 mg total) by mouth daily. 30 tablet 3   gabapentin (NEURONTIN) 600 MG tablet TAKE 1 TABLET BY MOUTH 4 TIMES DAILY (Patient taking differently: Take 600 mg by mouth 2 (two) times daily.) 360 tablet 0   isosorbide mononitrate (IMDUR) 60 MG 24 hr tablet TAKE 1 TABLET BY MOUTH ONCE DAILY 90 tablet 3   LANTUS SOLOSTAR 100 UNIT/ML Solostar Pen INJECT 40-50 UNITS UNDER THE SKIN ONCE EVERY NIGHT AT BEDTIME. 15 mL 3   metFORMIN (GLUCOPHAGE) 500 MG tablet TAKE 1 TABLET BY MOUTH TWICE (2) DAILY WITH A MEAL 180 tablet 3   metoprolol tartrate (LOPRESSOR) 25 MG tablet TAKE ONE TABLET BY MOUTH TWICE A DAY 180 tablet 3   Omega-3 Fatty Acids (FISH OIL) 1000 MG CAPS Take 1 capsule by mouth daily.     omeprazole (  PRILOSEC) 20 MG capsule Take 20 mg by mouth daily.     ONETOUCH VERIO test strip USE AS DIRECTED TO MONITOR BLOOD SUGARS UP TO 3 TIMES DAILY AS NEEDED 100 each 3   rosuvastatin (CRESTOR) 40 MG tablet TAKE ONE TABLET BY MOUTH ONCE DAILY 90 tablet 3   No current facility-administered medications on file prior to visit.   Allergies  Allergen Reactions   Neomycin Other (See Comments)    Visual disturbance and swelling in eyes   Social History   Socioeconomic History   Marital status: Married    Spouse name: Not on file   Number of children: 3   Years of education: Not on file   Highest education level: Not on file  Occupational History   Occupation: Engineer, petroleum  Tobacco Use   Smoking status: Former    Packs/day: 1.50    Years: 20.00    Additional pack years: 0.00    Total pack years: 30.00    Types: Cigarettes    Quit date: 02/13/1986    Years since quitting: 36.4   Smokeless tobacco: Never  Substance and Sexual Activity   Alcohol use: No   Drug use: No   Sexual activity: Yes  Other Topics Concern   Not on file  Social History Narrative   Lives in [place] with her husband. 3 children.  Works is Futures trader.   Former 30 Pk/yr Smoker (1.5 PPD x 20 yr) - quit in  1998.   Social Determinants of Health   Financial Resource Strain: Low Risk  (07/09/2020)   Overall Financial Resource Strain (CARDIA)    Difficulty of Paying Living Expenses: Not hard at all  Food Insecurity: No Food Insecurity (07/06/2022)   Hunger Vital Sign    Worried About Running Out of Food in the Last Year: Never true    Ran Out of Food in the Last Year: Never true  Transportation Needs: No Transportation Needs (07/06/2022)   PRAPARE - Administrator, Civil Service (Medical): No    Lack of Transportation (Non-Medical): No  Physical Activity: Inactive (07/09/2020)   Exercise Vital Sign    Days of Exercise per Week: 0 days    Minutes of Exercise per Session: 0 min  Stress: No Stress Concern Present (07/09/2020)   [employer] of Occupational Health - Occupational Stress Questionnaire    Feeling of Stress : Not at all  Social Connections: Moderately Integrated (07/09/2020)   Social Connection and Isolation Panel [NHANES]    Frequency of Communication with Friends and Family: More than three times a week    Frequency of Social Gatherings with Friends and Family: More than three times a week    Attends Religious Services: More than 4 times per year    Active Member of [employer] or Organizations: No    Attends Banker Meetings: Never    Marital Status: Married  Catering manager Violence: Not At Risk (07/06/2022)   Humiliation, Afraid, Rape, and Kick questionnaire    Fear of Current or Ex-Partner: No    Emotionally Abused: No    Physically Abused: No    Sexually Abused: No      Review of Systems  Musculoskeletal:  Positive for back pain.  All other systems reviewed and are negative.      Objective:   Physical Exam Constitutional:      General: She is not in acute distress.    Appearance: Normal appearance. She is obese. She is not ill-appearing,  toxic-appearing or diaphoretic.  HENT:     Head: Normocephalic and atraumatic.      Right Ear:  External ear normal. There is no impacted cerumen.     Left Ear: External ear normal. There is no impacted cerumen.     Nose: Nose normal. No congestion or rhinorrhea.     Mouth/Throat:     Mouth: Mucous membranes are moist.     Pharynx: Oropharynx is clear.  Eyes:     General: No scleral icterus.       Right eye: No discharge.        Left eye: No discharge.     Extraocular Movements: Extraocular movements intact.     Conjunctiva/sclera: Conjunctivae normal.     Pupils: Pupils are equal, round, and reactive to light.  Neck:     Vascular: No carotid bruit.  Cardiovascular:     Rate and Rhythm: Normal rate and regular rhythm.     Pulses: Normal pulses.     Heart sounds: Murmur heard.     No friction rub. No gallop.  Pulmonary:     Effort: Pulmonary effort is normal. No respiratory distress.     Breath sounds: No stridor. Rales present. No wheezing or rhonchi.  Chest:     Chest wall: No tenderness.  Abdominal:     General: Abdomen is flat. Bowel sounds are normal. There is no distension.     Palpations: Abdomen is soft.     Tenderness: There is no abdominal tenderness. There is no right CVA tenderness, left CVA tenderness, guarding or rebound.     Hernia: No hernia is present.  Musculoskeletal:     Cervical back: Normal range of motion and neck supple. No rigidity.     Right lower leg: No edema.     Left lower leg: No edema.  Lymphadenopathy:     Cervical: No cervical adenopathy.  Skin:    Findings: No bruising, erythema, lesion or rash.  Neurological:     Mental Status: She is alert.     Gait: Gait abnormal.     Deep Tendon Reflexes: Reflexes normal.           Assessment & Plan:  Altered mental status, unspecified altered mental status type - Plan: CBC with Differential/Platelet, BASIC METABOLIC PANEL WITH GFR  Left-sided headache I believe the altered mental status was more likely the Topamax although there is a possible contribution from an atypical infection.   Thankfully the patient is back to her baseline.  Repeat CBC to monitor for hypokalemia along with a BMP.,  Consult neurology to see if they be a candidate for greater conduction response injections

## 2022-07-12 ENCOUNTER — Other Ambulatory Visit: Payer: Self-pay | Admitting: Family Medicine

## 2022-07-12 LAB — BASIC METABOLIC PANEL WITH GFR
BUN/Creatinine Ratio: 17 (calc) (ref 6–22)
BUN: 10 mg/dL (ref 7–25)
CO2: 30 mmol/L (ref 20–32)
Calcium: 10.3 mg/dL (ref 8.6–10.4)
Chloride: 103 mmol/L (ref 98–110)
Creat: 0.59 mg/dL — ABNORMAL LOW (ref 0.60–0.95)
Glucose, Bld: 153 mg/dL — ABNORMAL HIGH (ref 65–99)
Potassium: 3.7 mmol/L (ref 3.5–5.3)
Sodium: 143 mmol/L (ref 135–146)
eGFR: 91 mL/min/{1.73_m2} (ref >=60)

## 2022-07-12 LAB — CBC WITH DIFFERENTIAL/PLATELET
Absolute Monocytes: 423 cells/uL (ref 200–950)
Basophils Relative: 0.5 %
Eosinophils Absolute: 70 cells/uL (ref 15–500)
Eosinophils Relative: 1.2 %
HCT: 45.3 % — ABNORMAL HIGH (ref 35.0–45.0)
Hemoglobin: 14.5 g/dL (ref 11.7–15.5)
MCHC: 32 g/dL (ref 32.0–36.0)
MCV: 90.2 fL (ref 80.0–100.0)
Monocytes Relative: 7.3 %
Neutro Abs: 2894 cells/uL (ref 1500–7800)
Neutrophils Relative %: 49.9 %
Platelets: 222 10*3/uL (ref 140–400)
RDW: 12.5 % (ref 11.0–15.0)
Total Lymphocyte: 41.1 %
WBC: 5.8 10*3/uL (ref 3.8–10.8)

## 2022-07-12 NOTE — Telephone Encounter (Signed)
Requested medication (s) are due for refill today: For review  Requested medication (s) are on the active medication list: no     Last refill: 06/15/22  #30  0 refills  Future visit scheduled no  Notes to clinic: Med is not on current med profile, discontinued 07/08/22. Cannot refuse non-delegated meds per protocol.  Requested Prescriptions  Pending Prescriptions Disp Refills   traMADol (ULTRAM) 50 MG tablet [Pharmacy Med Name: TRAMADOL HYDROCHLORIDE 50MG  TABLET] 30 tablet 0    Sig: TAKE ONE TABLET BY MOUTH AT BEDTIME AS NEEDED FOR SEVERE PAIN     Not Delegated - Analgesics:  Opioid Agonists Failed - 07/12/2022  8:35 AM      Failed - This refill cannot be delegated      Failed - Urine Drug Screen completed in last 360 days      Failed - Valid encounter within last 3 months    Recent Outpatient Visits           1 year ago Type 2 diabetes mellitus treated without insulin (HCC)   [facility] Pickard, Priscille Heidelberg, MD   1 year ago Diastolic dysfunction   [facility] Tanya Nones, Priscille Heidelberg, MD   1 year ago Leg swelling   [facility] Donita Brooks, MD   2 years ago Type 2 diabetes mellitus treated without insulin (HCC)   [facility] Donita Brooks, MD   3 years ago Type 2 diabetes mellitus treated without insulin (HCC)   [facility] Pickard, Priscille Heidelberg, MD

## 2022-07-17 ENCOUNTER — Other Ambulatory Visit: Payer: Self-pay | Admitting: Cardiology

## 2022-07-18 ENCOUNTER — Encounter: Payer: Self-pay | Admitting: Family Medicine

## 2022-07-19 ENCOUNTER — Other Ambulatory Visit: Payer: Self-pay

## 2022-07-19 DIAGNOSIS — M461 Sacroiliitis, not elsewhere classified: Secondary | ICD-10-CM

## 2022-07-19 DIAGNOSIS — W19XXXS Unspecified fall, sequela: Secondary | ICD-10-CM

## 2022-07-19 DIAGNOSIS — M47816 Spondylosis without myelopathy or radiculopathy, lumbar region: Secondary | ICD-10-CM

## 2022-07-19 DIAGNOSIS — M419 Scoliosis, unspecified: Secondary | ICD-10-CM

## 2022-07-19 DIAGNOSIS — M8008XA Age-related osteoporosis with current pathological fracture, vertebra(e), initial encounter for fracture: Secondary | ICD-10-CM

## 2022-07-19 NOTE — Progress Notes (Signed)
Subjective:   April [name] is a 82 y.o. female who presents for Medicare Annual (Subsequent) preventive examination.  I connected with  April [name] on 07/20/22 by a audio enabled telemedicine application and verified that I am speaking with the correct person using two identifiers.  Patient Location: Home  Provider Location: Home Office  I discussed the limitations of evaluation and management by telemedicine. The patient expressed understanding and agreed to proceed.  Review of Systems     Cardiac Risk Factors include: advanced age (>63men, >31 women);diabetes mellitus;dyslipidemia;hypertension     Objective:    Today's Vitals   07/20/22 1536  Weight: 124 lb (56.2 kg)  Height: 5\' 1"  (1.549 m)   Body mass index is 23.43 kg/m.     07/20/2022    3:41 PM 07/06/2022   10:11 AM 04/19/2022   12:00 AM 04/18/2022    5:36 PM 05/13/2021    2:08 AM 07/09/2020   10:04 AM 02/03/2014   10:58 AM  Advanced Directives  Does Patient Have a Medical Advance Directive? No No No No No No No  Would patient like information on creating a medical advance directive? Yes (MAU/Ambulatory/Procedural Areas - Information given) No - Patient declined No - Patient declined No - Patient declined No - Patient declined No - Patient declined     Current Medications (verified) Outpatient Encounter Medications as of 07/20/2022  Medication Sig   apixaban (ELIQUIS) 5 MG TABS tablet Take 1 tablet (5 mg total) by mouth 2 (two) times daily.   calcitonin, salmon, (MIACALCIN/FORTICAL) 200 UNIT/ACT nasal spray Place into alternate nostrils.   Calcium Carbonate-Vitamin D (CALCIUM 600 + D PO) Take 1 tablet by mouth daily.   escitalopram (LEXAPRO) 10 MG tablet Take 1 tablet (10 mg total) by mouth daily.   gabapentin (NEURONTIN) 600 MG tablet TAKE 1 TABLET BY MOUTH 4 TIMES DAILY (Patient taking differently: Take 600 mg by mouth 2 (two) times daily.)   isosorbide mononitrate (IMDUR) 60 MG 24 hr tablet TAKE ONE TABLET BY  MOUTH ONCE DAILY   LANTUS SOLOSTAR 100 UNIT/ML Solostar Pen INJECT 40-50 UNITS UNDER THE SKIN ONCE EVERY NIGHT AT BEDTIME.   metFORMIN (GLUCOPHAGE) 500 MG tablet TAKE 1 TABLET BY MOUTH TWICE (2) DAILY WITH A MEAL   metoprolol tartrate (LOPRESSOR) 25 MG tablet TAKE ONE TABLET BY MOUTH TWICE A DAY   Omega-3 Fatty Acids (FISH OIL) 1000 MG CAPS Take 1 capsule by mouth daily.   omeprazole (PRILOSEC) 20 MG capsule Take 20 mg by mouth daily.   ONETOUCH VERIO test strip USE AS DIRECTED TO MONITOR BLOOD SUGARS UP TO 3 TIMES DAILY AS NEEDED   rosuvastatin (CRESTOR) 40 MG tablet TAKE ONE TABLET BY MOUTH ONCE DAILY   traMADol (ULTRAM) 50 MG tablet TAKE ONE TABLET BY MOUTH AT BEDTIME AS NEEDED FOR SEVERE PAIN   No facility-administered encounter medications on file as of 07/20/2022.    Allergies (verified) Neomycin   History: Past Medical History:  Diagnosis Date   Abnormal weight gain    Acute diverticulitis 05/13/2021   Arthritis    "joints" (06/13/2013)   COPD (chronic obstructive pulmonary disease) (HCC)    Coronary artery disease, non-occlusive 06/13/2013   Cardiac Cath: sharp Angle take-off of Large Dominant Cx: ~70-80% mid Cx bifurcation lesion (Lateral OM &  AVGCx-PL-PDA both with hairpin ostial & ~50% lesions) - Not PCI amenable due to vessel tortuosity; ~40-50% mid LAD;Ramus - no significnat diseaes; small non-dominant RCA   Diverticulosis  GERD (gastroesophageal reflux disease)    History of stress test    a. 09/2006 nl Dobutamine Echo   Hyperlipidemia    Hypertension    NIDDM (non-insulin dependent diabetes mellitus)    On home oxygen therapy    "2L q hs; runs into my BIPAP" (06/13/2013)   OSA (obstructive sleep apnea)    "BIPAP w/O2" (06/13/2013)   Tracheobronchomalacia    a. followed by [facility]   Past Surgical History:  Procedure Laterality Date   CARDIAC CATHETERIZATION  06/2013   Cardiac Cath: sharp Angle take-off of Large Dominant Cx: ~70-80% mid Cx bifurcation lesion  (Lateral OM &  AVGCx-PL-PDA both with hairpin ostial & ~50% lesions) - Not PCI amenable due to vessel tortuosity; ~40-50% mid LAD;Ramus - no significnat diseaes; small non-dominant RCA   LEFT HEART CATHETERIZATION WITH CORONARY ANGIOGRAM N/A 06/16/2013   Procedure: LEFT HEART CATHETERIZATION WITH CORONARY ANGIOGRAM;  Surgeon: Marykay Lex, MD;  Location: [facility] CATH LAB;  Service: Cardiovascular;  Laterality: N/A;   TRANSTHORACIC ECHOCARDIOGRAM  06/17/2013   Normal LV size and function. EF 55-60% with no regional WMA. Grade 1 diastolic dysfunction. No significant valvular lesions   TUBAL LIGATION     Family History  Problem Relation Age of Onset   Heart disease Mother        developed coronary dzs in her 60's.   Clotting disorder Mother    Breast cancer Sister 4   Diabetes Paternal Aunt    Asthma Maternal Grandmother    Heart disease Maternal Grandmother    Heart disease Maternal Grandfather    Diabetes Brother    CAD Brother        s/p cabg in his 70's   CAD Brother        s/p cabg in his 28's.   Social History   Socioeconomic History   Marital status: Married    Spouse name: Not on file   Number of children: 3   Years of education: Not on file   Highest education level: Not on file  Occupational History   Occupation: Engineer, petroleum  Tobacco Use   Smoking status: Former    Packs/day: 1.50    Years: 20.00    Additional pack years: 0.00    Total pack years: 30.00    Types: Cigarettes    Quit date: 02/13/1986    Years since quitting: 36.4   Smokeless tobacco: Never  Substance and Sexual Activity   Alcohol use: No   Drug use: No   Sexual activity: Yes  Other Topics Concern   Not on file  Social History Narrative   Lives in [place] with her husband. 3 children.  Works is Futures trader.   Former 30 Pk/yr Smoker (1.5 PPD x 20 yr) - quit in 1998.   Social Determinants of Health   Financial Resource Strain: Low Risk  (07/20/2022)   Overall Financial Resource Strain  (CARDIA)    Difficulty of Paying Living Expenses: Not hard at all  Food Insecurity: No Food Insecurity (07/20/2022)   Hunger Vital Sign    Worried About Running Out of Food in the Last Year: Never true    Ran Out of Food in the Last Year: Never true  Transportation Needs: No Transportation Needs (07/20/2022)   PRAPARE - Administrator, Civil Service (Medical): No    Lack of Transportation (Non-Medical): No  Physical Activity: Inactive (07/20/2022)   Exercise Vital Sign    Days of Exercise per Week: 0  days    Minutes of Exercise per Session: 0 min  Stress: No Stress Concern Present (07/20/2022)   [employer] of Occupational Health - Occupational Stress Questionnaire    Feeling of Stress : Not at all  Social Connections: Moderately Integrated (07/20/2022)   Social Connection and Isolation Panel [NHANES]    Frequency of Communication with Friends and Family: More than three times a week    Frequency of Social Gatherings with Friends and Family: Three times a week    Attends Religious Services: More than 4 times per year    Active Member of Clubs or Organizations: No    Attends Banker Meetings: Never    Marital Status: Married    Tobacco Counseling Counseling given: Not Answered   Clinical Intake:  Pre-visit preparation completed: Yes  Pain : No/denies pain  Diabetes: Yes CBG done?: No Did pt. bring in CBG monitor from home?: No  How often do you need to have someone help you when you read instructions, pamphlets, or other written materials from your doctor or pharmacy?: 1 - Never  Diabetic? Yes   Nutrition Risk Assessment:  Has the patient had any N/V/D within the last 2 months?  No  Does the patient have any non-healing wounds?  No  Has the patient had any unintentional weight loss or weight gain?  No   Diabetes:  Is the patient diabetic?  Yes  If diabetic, was a CBG obtained today?  No  Did the patient bring in their glucometer from home?   Yes  How often do you monitor your CBG's? Daily .   Financial Strains and Diabetes Management:  Are you having any financial strains with the device, your supplies or your medication? No .  Does the patient want to be seen by Chronic Care Management for management of their diabetes?  No  Would the patient like to be referred to a Nutritionist or for Diabetic Management?  No   Diabetic Exams:  Diabetic Eye Exam: Overdue for diabetic eye exam. Pt has been advised about the importance in completing this exam. Patient advised to call and schedule an eye exam. Diabetic Foot Exam: Overdue, Pt has been advised about the importance in completing this exam. Pt is scheduled for diabetic foot exam on at next office visit.   Interpreter Needed?: No  Comments: Assisted with visit by daughter- Revonda Standard Information entered by :: Kandis Fantasia LPN   Activities of Daily Living    07/20/2022    3:40 PM 07/06/2022    7:00 PM  In your present state of health, do you have any difficulty performing the following activities:  Hearing? 1 1  Vision? 0 0  Difficulty concentrating or making decisions? 0 0  Walking or climbing stairs? 1 1  Dressing or bathing? 1 1  Doing errands, shopping? 1 0  Preparing Food and eating ? Y   Using the Toilet? N   In the past six months, have you accidently leaked urine? Y   Do you have problems with loss of bowel control? N   Managing your Medications? Y   Managing your Finances? Y   Housekeeping or managing your Housekeeping? Y     Patient Care Team: Donita Brooks, MD as PCP - General (Family Medicine) Marykay Lex, MD as PCP - Cardiology (Cardiology) Donita Brooks, MD (Family Medicine)  Indicate any recent Medical Services you may have received from other than [facility] providers in the past year (date may be  approximate).     Assessment:   This is a routine wellness examination for Caedence.  Hearing/Vision screen Hearing Screening - Comments:: Denies  hearing difficulties   Vision Screening - Comments:: Wears rx glasses - up to date with routine eye exams    Dietary issues and exercise activities discussed: Current Exercise Habits: The patient does not participate in regular exercise at present   Goals Addressed   None    Depression Screen    07/20/2022    3:39 PM 07/11/2022    9:27 AM 04/04/2022    2:30 PM 12/01/2021    3:30 PM 07/09/2020   10:07 AM 04/05/2020    3:03 PM 12/10/2017    8:09 AM  PHQ 2/9 Scores  PHQ - 2 Score 0 0 0 0 0 0 0    Fall Risk    07/20/2022    3:40 PM 07/11/2022    9:27 AM 04/04/2022    2:30 PM 12/01/2021    3:30 PM 07/09/2020   10:05 AM  Fall Risk   Falls in the past year? 1 1 1  0 1  Number falls in past yr: 0 0 0 0 0  Injury with Fall? 1 1 0 0 1  Risk for fall due to : History of fall(s);Impaired balance/gait;Impaired mobility History of fall(s);Impaired balance/gait;Impaired mobility History of fall(s);Impaired balance/gait;Impaired mobility No Fall Risks No Fall Risks  Follow up Education provided;Falls prevention discussed;Falls evaluation completed Falls prevention discussed Falls prevention discussed;Education provided Falls prevention discussed Falls evaluation completed;Falls prevention discussed    FALL RISK PREVENTION PERTAINING TO THE HOME:  Any stairs in or around the home? No  If so, are there any without handrails? No  Home free of loose throw rugs in walkways, pet beds, electrical cords, etc? Yes  Adequate lighting in your home to reduce risk of falls? Yes   ASSISTIVE DEVICES UTILIZED TO PREVENT FALLS:  Life alert? No  Use of a cane, walker or w/c? Yes  Grab bars in the bathroom? Yes  Shower chair or bench in shower? Yes  Elevated toilet seat or a handicapped toilet? Yes   TIMED UP AND GO:  Was the test performed? No . Telephonic visit   Cognitive Function:        07/20/2022    3:41 PM 04/05/2020    3:02 PM  6CIT Screen  What Year? 0 points 0 points  What month? 0  points 0 points  What time? 0 points 0 points  Count back from 20 0 points 0 points  Months in reverse 0 points 0 points  Repeat phrase 0 points 0 points  Total Score 0 points 0 points    Immunizations Immunization History  Administered Date(s) Administered   Fluad Quad(high Dose 65+) 11/11/2018, 11/20/2019, 11/04/2020, 11/10/2021   Influenza Nasal 11/14/2011   Influenza Split 11/02/2015   Influenza Whole 11/26/2006, 11/27/2008, 11/16/2009   Influenza,inj,Quad PF,6+ Mos 12/11/2012, 12/22/2013, 11/03/2014, 11/23/2014, 12/10/2017   PFIZER(Purple Top)SARS-COV-2 Vaccination 04/17/2019, 05/13/2019   Pneumococcal Conjugate-13 01/21/2013   Pneumococcal Polysaccharide-23 03/19/2006, 12/22/2013    TDAP status: Due, Education has been provided regarding the importance of this vaccine. Advised may receive this vaccine at local pharmacy or Health Dept. Aware to provide a copy of the vaccination record if obtained from local pharmacy or Health Dept. Verbalized acceptance and understanding.  Pneumococcal vaccine status: Up to date  Covid-19 vaccine status: Information provided on how to obtain vaccines.   Qualifies for Shingles Vaccine? Yes   Zostavax completed No  Shingrix Completed?: No.    Education has been provided regarding the importance of this vaccine. Patient has been advised to call insurance company to determine out of pocket expense if they have not yet received this vaccine. Advised may also receive vaccine at local pharmacy or Health Dept. Verbalized acceptance and understanding.  Screening Tests Health Maintenance  Topic Date Due   COVID-19 Vaccine (3 - Pfizer risk series) 07/27/2022 (Originally 06/10/2019)   Zoster Vaccines- Shingrix (1 of 2) 10/11/2022 (Originally 01/21/1960)   FOOT EXAM  11/14/2022 (Originally 04/05/2021)   OPHTHALMOLOGY EXAM  01/11/2023 (Originally 03/09/2022)   INFLUENZA VACCINE  09/14/2022   HEMOGLOBIN A1C  10/03/2022   Diabetic kidney evaluation - Urine  ACR  04/05/2023   Diabetic kidney evaluation - eGFR measurement  07/11/2023   Medicare Annual Wellness (AWV)  07/20/2023   Pneumonia Vaccine 65+ Years old  Completed   DEXA SCAN  Completed   HPV VACCINES  Aged Out   DTaP/Tdap/Td  Discontinued   Hepatitis C Screening  Discontinued    Health Maintenance  There are no preventive care reminders to display for this patient.  Colorectal cancer screening: No longer required.   Mammogram status: No longer required due to age and preference .  Bone Density status: Completed 09/09/20. Results reflect: Bone density results: OSTEOPENIA. Repeat every 2 years.  Lung Cancer Screening: (Low Dose CT Chest recommended if Age 18-80 years, 30 pack-year currently smoking OR have quit w/in 15years.) does not qualify.   Lung Cancer Screening Referral: n/a  Additional Screening:  Hepatitis C Screening: does qualify; Completed 04/21/13  Vision Screening: Recommended annual ophthalmology exams for early detection of glaucoma and other disorders of the eye. Is the patient up to date with their annual eye exam?  No  Who is the provider or what is the name of the office in which the patient attends annual eye exams? none If pt is not established with a provider, would they like to be referred to a provider to establish care? No .   Dental Screening: Recommended annual dental exams for proper oral hygiene  Community Resource Referral / Chronic Care Management: CRR required this visit?  No   CCM required this visit?  No      Plan:     I have personally reviewed and noted the following in the patient's chart:   Medical and social history Use of alcohol, tobacco or illicit drugs  Current medications and supplements including opioid prescriptions. Patient is not currently taking opioid prescriptions. Functional ability and status Nutritional status Physical activity Advanced directives List of other physicians Hospitalizations, surgeries, and ER  visits in previous 12 months Vitals Screenings to include cognitive, depression, and falls Referrals and appointments  In addition, I have reviewed and discussed with patient certain preventive protocols, quality metrics, and best practice recommendations. A written personalized care plan for preventive services as well as general preventive health recommendations were provided to patient.     Durwin Nora, [place]   02/18/1094   Due to this being a virtual visit, the after visit summary with patients personalized plan was offered to patient via mail or my-chart.  Patient would like to access on my-chart  Nurse Notes: No concerns

## 2022-07-19 NOTE — Patient Instructions (Incomplete)
[name] , Thank you for taking time to come for your Medicare Wellness Visit. I appreciate your ongoing commitment to your health goals. Please review the following plan we discussed and let me know if I can assist you in the future.   These are the goals we discussed:  Goals      DIET - INCREASE WATER INTAKE     Exercise 3x per week (30 min per time)        This is a list of the screening recommended for you and due dates:  Health Maintenance  Topic Date Due   COVID-19 Vaccine (3 - Pfizer risk series) 07/27/2022*   Zoster (Shingles) Vaccine (1 of 2) 10/11/2022*   Complete foot exam   11/14/2022*   Eye exam for diabetics  01/11/2023*   Flu Shot  09/14/2022   Hemoglobin A1C  10/03/2022   Yearly kidney health urinalysis for diabetes  04/05/2023   Yearly kidney function blood test for diabetes  07/11/2023   Medicare Annual Wellness Visit  07/20/2023   Pneumonia Vaccine  Completed   DEXA scan (bone density measurement)  Completed   HPV Vaccine  Aged Out   DTaP/Tdap/Td vaccine  Discontinued   Hepatitis C Screening  Discontinued  *Topic was postponed. The date shown is not the original due date.    Advanced directives: Information on Advanced Care Planning can be found at [facility] Advance Health Care Directives Advance Health Care Directives ([email or web address]  Please bring a copy of your health care power of attorney and living will to the office to be added to your chart at your convenience.   Conditions/risks identified: Aim for 30 minutes of exercise or brisk walking, 6-8 glasses of water, and 5 servings of fruits and vegetables each day.  Next appointment: Follow up in one year for your annual wellness visit    Preventive Care 65 Years and Older, Female Preventive care refers to lifestyle choices and visits with your health care provider that can promote health and wellness. What does preventive care include? A yearly physical exam. This is also called an  annual well check. Dental exams once or twice a year. Routine eye exams. Ask your health care provider how often you should have your eyes checked. Personal lifestyle choices, including: Daily care of your teeth and gums. Regular physical activity. Eating a healthy diet. Avoiding tobacco and drug use. Limiting alcohol use. Practicing safe sex. Taking low-dose aspirin every day. Taking vitamin and mineral supplements as recommended by your health care provider. What happens during an annual well check? The services and screenings done by your health care provider during your annual well check will depend on your age, overall health, lifestyle risk factors, and family history of disease. Counseling  Your health care provider may ask you questions about your: Alcohol use. Tobacco use. Drug use. Emotional well-being. Home and relationship well-being. Sexual activity. Eating habits. History of falls. Memory and ability to understand (cognition). Work and work Astronomer. Reproductive health. Screening  You may have the following tests or measurements: Height, weight, and BMI. Blood pressure. Lipid and cholesterol levels. These may be checked every 5 years, or more frequently if you are over 55 years old. Skin check. Lung cancer screening. You may have this screening every year starting at age 12 if you have a 30-pack-year history of smoking and currently smoke or have quit within the past 15 years. Fecal occult blood test (FOBT) of the stool. You may have this  test every year starting at age 16. Flexible sigmoidoscopy or colonoscopy. You may have a sigmoidoscopy every 5 years or a colonoscopy every 10 years starting at age 39. Hepatitis C blood test. Hepatitis B blood test. Sexually transmitted disease (STD) testing. Diabetes screening. This is done by checking your blood sugar (glucose) after you have not eaten for a while (fasting). You may have this done every 1-3 years. Bone  density scan. This is done to screen for osteoporosis. You may have this done starting at age 63. Mammogram. This may be done every 1-2 years. Talk to your health care provider about how often you should have regular mammograms. Talk with your health care provider about your test results, treatment options, and if necessary, the need for more tests. Vaccines  Your health care provider may recommend certain vaccines, such as: Influenza vaccine. This is recommended every year. Tetanus, diphtheria, and acellular pertussis (Tdap, Td) vaccine. You may need a Td booster every 10 years. Zoster vaccine. You may need this after age 52. Pneumococcal 13-valent conjugate (PCV13) vaccine. One dose is recommended after age 76. Pneumococcal polysaccharide (PPSV23) vaccine. One dose is recommended after age 8. Talk to your health care provider about which screenings and vaccines you need and how often you need them. This information is not intended to replace advice given to you by your health care provider. Make sure you discuss any questions you have with your health care provider. Document Released: 02/26/2015 Document Revised: 10/20/2015 Document Reviewed: 12/01/2014 Elsevier Interactive Patient Education  2017 [employer].  Fall Prevention in the Home Falls can cause injuries. They can happen to people of all ages. There are many things you can do to make your home safe and to help prevent falls. What can I do on the outside of my home? Regularly fix the edges of walkways and driveways and fix any cracks. Remove anything that might make you trip as you walk through a door, such as a raised step or threshold. Trim any bushes or trees on the path to your home. Use bright outdoor lighting. Clear any walking paths of anything that might make someone trip, such as rocks or tools. Regularly check to see if handrails are loose or broken. Make sure that both sides of any steps have handrails. Any raised  decks and porches should have guardrails on the edges. Have any leaves, snow, or ice cleared regularly. Use sand or salt on walking paths during winter. Clean up any spills in your garage right away. This includes oil or grease spills. What can I do in the bathroom? Use night lights. Install grab bars by the toilet and in the tub and shower. Do not use towel bars as grab bars. Use non-skid mats or decals in the tub or shower. If you need to sit down in the shower, use a plastic, non-slip stool. Keep the floor dry. Clean up any water that spills on the floor as soon as it happens. Remove soap buildup in the tub or shower regularly. Attach bath mats securely with double-sided non-slip rug tape. Do not have throw rugs and other things on the floor that can make you trip. What can I do in the bedroom? Use night lights. Make sure that you have a light by your bed that is easy to reach. Do not use any sheets or blankets that are too big for your bed. They should not hang down onto the floor. Have a firm chair that has side arms. You can  use this for support while you get dressed. Do not have throw rugs and other things on the floor that can make you trip. What can I do in the kitchen? Clean up any spills right away. Avoid walking on wet floors. Keep items that you use a lot in easy-to-reach places. If you need to reach something above you, use a strong step stool that has a grab bar. Keep electrical cords out of the way. Do not use floor polish or wax that makes floors slippery. If you must use wax, use non-skid floor wax. Do not have throw rugs and other things on the floor that can make you trip. What can I do with my stairs? Do not leave any items on the stairs. Make sure that there are handrails on both sides of the stairs and use them. Fix handrails that are broken or loose. Make sure that handrails are as long as the stairways. Check any carpeting to make sure that it is firmly attached  to the stairs. Fix any carpet that is loose or worn. Avoid having throw rugs at the top or bottom of the stairs. If you do have throw rugs, attach them to the floor with carpet tape. Make sure that you have a light switch at the top of the stairs and the bottom of the stairs. If you do not have them, ask someone to add them for you. What else can I do to help prevent falls? Wear shoes that: Do not have high heels. Have rubber bottoms. Are comfortable and fit you well. Are closed at the toe. Do not wear sandals. If you use a stepladder: Make sure that it is fully opened. Do not climb a closed stepladder. Make sure that both sides of the stepladder are locked into place. Ask someone to hold it for you, if possible. Clearly mark and make sure that you can see: Any grab bars or handrails. First and last steps. Where the edge of each step is. Use tools that help you move around (mobility aids) if they are needed. These include: Canes. Walkers. Scooters. Crutches. Turn on the lights when you go into a dark area. Replace any light bulbs as soon as they burn out. Set up your furniture so you have a clear path. Avoid moving your furniture around. If any of your floors are uneven, fix them. If there are any pets around you, be aware of where they are. Review your medicines with your doctor. Some medicines can make you feel dizzy. This can increase your chance of falling. Ask your doctor what other things that you can do to help prevent falls. This information is not intended to replace advice given to you by your health care provider. Make sure you discuss any questions you have with your health care provider. Document Released: 11/26/2008 Document Revised: 07/08/2015 Document Reviewed: 03/06/2014 Elsevier Interactive Patient Education  2017 [employer].

## 2022-07-20 ENCOUNTER — Other Ambulatory Visit: Payer: Self-pay

## 2022-07-20 ENCOUNTER — Ambulatory Visit (INDEPENDENT_AMBULATORY_CARE_PROVIDER_SITE_OTHER): Payer: Medicare Other

## 2022-07-20 VITALS — Ht 61.0 in | Wt 124.0 lb

## 2022-07-20 DIAGNOSIS — G629 Polyneuropathy, unspecified: Secondary | ICD-10-CM

## 2022-07-20 DIAGNOSIS — M47816 Spondylosis without myelopathy or radiculopathy, lumbar region: Secondary | ICD-10-CM

## 2022-07-20 DIAGNOSIS — Z0000 Encounter for general adult medical examination without abnormal findings: Secondary | ICD-10-CM | POA: Diagnosis not present

## 2022-07-20 DIAGNOSIS — M5416 Radiculopathy, lumbar region: Secondary | ICD-10-CM

## 2022-07-22 DIAGNOSIS — J9601 Acute respiratory failure with hypoxia: Secondary | ICD-10-CM | POA: Diagnosis not present

## 2022-07-27 ENCOUNTER — Telehealth: Payer: Self-pay | Admitting: Family Medicine

## 2022-07-27 NOTE — Telephone Encounter (Signed)
Outbound call placed to offer patient a diabetic eye exam at our office. As per patient's daughter, she already has an upcoming appointment at her eye doctor scheduled for 09/25/2022.

## 2022-08-01 ENCOUNTER — Encounter: Payer: Self-pay | Admitting: Family Medicine

## 2022-08-01 ENCOUNTER — Other Ambulatory Visit: Payer: Self-pay | Admitting: Family Medicine

## 2022-08-01 MED ORDER — TRIAMCINOLONE ACETONIDE 0.1 % MT PSTE: 1.0000 | PASTE | Freq: Two times a day (BID) | OROMUCOSAL | 12 refills | Status: AC

## 2022-08-07 DIAGNOSIS — I2510 Atherosclerotic heart disease of native coronary artery without angina pectoris: Secondary | ICD-10-CM | POA: Diagnosis not present

## 2022-08-07 DIAGNOSIS — Z5982 Transportation insecurity: Secondary | ICD-10-CM | POA: Diagnosis not present

## 2022-08-07 DIAGNOSIS — E1142 Type 2 diabetes mellitus with diabetic polyneuropathy: Secondary | ICD-10-CM | POA: Diagnosis not present

## 2022-08-07 DIAGNOSIS — E785 Hyperlipidemia, unspecified: Secondary | ICD-10-CM | POA: Diagnosis not present

## 2022-08-07 DIAGNOSIS — K219 Gastro-esophageal reflux disease without esophagitis: Secondary | ICD-10-CM | POA: Diagnosis not present

## 2022-08-07 DIAGNOSIS — M1990 Unspecified osteoarthritis, unspecified site: Secondary | ICD-10-CM | POA: Diagnosis not present

## 2022-08-08 DIAGNOSIS — G4733 Obstructive sleep apnea (adult) (pediatric): Secondary | ICD-10-CM | POA: Diagnosis not present

## 2022-08-09 ENCOUNTER — Other Ambulatory Visit: Payer: Self-pay | Admitting: Family Medicine

## 2022-08-10 DIAGNOSIS — G4733 Obstructive sleep apnea (adult) (pediatric): Secondary | ICD-10-CM | POA: Diagnosis not present

## 2022-08-14 ENCOUNTER — Other Ambulatory Visit: Payer: Self-pay | Admitting: Cardiology

## 2022-08-15 ENCOUNTER — Other Ambulatory Visit: Payer: Self-pay | Admitting: Cardiology

## 2022-08-21 ENCOUNTER — Other Ambulatory Visit: Payer: Self-pay | Admitting: Family Medicine

## 2022-08-21 DIAGNOSIS — J9601 Acute respiratory failure with hypoxia: Secondary | ICD-10-CM | POA: Diagnosis not present

## 2022-08-29 ENCOUNTER — Other Ambulatory Visit: Payer: Self-pay | Admitting: Family Medicine

## 2022-08-30 NOTE — Telephone Encounter (Signed)
Unable to refill per protocol, Rx expired. Discontinued 07/08/22.  Requested Prescriptions  Pending Prescriptions Disp Refills   topiramate (TOPAMAX) 25 MG tablet [Pharmacy Med Name: TOPIRAMATE 25MG  TABLET] 120 tablet 1    Sig: TAKE TWO TABLETS (50 MG TOTAL) BY MOUTH TWO (TWO) TIMES DAILY. WEAN UP GRADUALLY ON MEDICINE LIKE WE DISCUSSED     Neurology: Anticonvulsants - topiramate & zonisamide Failed - 08/29/2022  5:19 PM      Failed - Cr in normal range and within 360 days    Creat  Date Value Ref Range Status  07/11/2022 0.59 (L) 0.60 - 0.95 mg/dL Final   Creatinine, Urine  Date Value Ref Range Status  04/04/2022 47 20 - 275 mg/dL Final         Failed - AST in normal range and within 360 days    AST  Date Value Ref Range Status  07/07/2022 12 (L) 15 - 41 U/L Final         Failed - Valid encounter within last 12 months    Recent Outpatient Visits           1 year ago Type 2 diabetes mellitus treated without insulin (HCC)   [facility] Pickard, Priscille Heidelberg, MD   1 year ago Diastolic dysfunction   [facility] Pickard, Priscille Heidelberg, MD   1 year ago Leg swelling   [facility] Donita Brooks, MD   2 years ago Type 2 diabetes mellitus treated without insulin (HCC)   [facility] Donita Brooks, MD   3 years ago Type 2 diabetes mellitus treated without insulin (HCC)   [facility] Pickard, Priscille Heidelberg, MD              Passed - CO2 in normal range and within 360 days    CO2  Date Value Ref Range Status  07/11/2022 30 20 - 32 mmol/L Final   Bicarbonate  Date Value Ref Range Status  10/16/2006 29.6 (H)  Final         Passed - ALT in normal range and within 360 days    ALT  Date Value Ref Range Status  07/07/2022 10 0 - 44 U/L Final         Passed - Completed PHQ-2 or PHQ-9 in the last 360 days

## 2022-09-07 DIAGNOSIS — G4733 Obstructive sleep apnea (adult) (pediatric): Secondary | ICD-10-CM | POA: Diagnosis not present

## 2022-09-20 DIAGNOSIS — E119 Type 2 diabetes mellitus without complications: Secondary | ICD-10-CM | POA: Diagnosis not present

## 2022-09-20 DIAGNOSIS — H02834 Dermatochalasis of left upper eyelid: Secondary | ICD-10-CM | POA: Diagnosis not present

## 2022-09-20 DIAGNOSIS — D3131 Benign neoplasm of right choroid: Secondary | ICD-10-CM | POA: Diagnosis not present

## 2022-09-20 DIAGNOSIS — H02831 Dermatochalasis of right upper eyelid: Secondary | ICD-10-CM | POA: Diagnosis not present

## 2022-09-20 LAB — HM DIABETES EYE EXAM

## 2022-09-21 DIAGNOSIS — J9601 Acute respiratory failure with hypoxia: Secondary | ICD-10-CM | POA: Diagnosis not present

## 2022-09-26 ENCOUNTER — Other Ambulatory Visit: Payer: Self-pay | Admitting: Family Medicine

## 2022-09-26 ENCOUNTER — Encounter (HOSPITAL_BASED_OUTPATIENT_CLINIC_OR_DEPARTMENT_OTHER): Payer: Self-pay | Admitting: Pulmonary Disease

## 2022-09-26 DIAGNOSIS — G4733 Obstructive sleep apnea (adult) (pediatric): Secondary | ICD-10-CM

## 2022-09-26 DIAGNOSIS — Z1231 Encounter for screening mammogram for malignant neoplasm of breast: Secondary | ICD-10-CM

## 2022-10-06 ENCOUNTER — Encounter: Payer: Self-pay | Admitting: Family Medicine

## 2022-10-08 DIAGNOSIS — G4733 Obstructive sleep apnea (adult) (pediatric): Secondary | ICD-10-CM | POA: Diagnosis not present

## 2022-10-11 ENCOUNTER — Ambulatory Visit: Payer: Medicare Other | Admitting: Psychiatry

## 2022-10-11 ENCOUNTER — Encounter: Payer: Self-pay | Admitting: Psychiatry

## 2022-10-11 VITALS — BP 124/69 | HR 56 | Ht 60.0 in | Wt 130.0 lb

## 2022-10-11 DIAGNOSIS — M5481 Occipital neuralgia: Secondary | ICD-10-CM | POA: Diagnosis not present

## 2022-10-11 DIAGNOSIS — M542 Cervicalgia: Secondary | ICD-10-CM | POA: Diagnosis not present

## 2022-10-11 MED ORDER — GABAPENTIN 300 MG PO CAPS: ORAL_CAPSULE | ORAL | 2 refills | Status: AC

## 2022-10-11 NOTE — Progress Notes (Signed)
Referring:  Donita Brooks, MD [address] [place],  [place] [address]  PCP: Donita Brooks, MD  Neurology was asked to evaluate April [name], an 82 year old female for a chief complaint of headaches.  Our recommendations of care will be communicated by shared medical record.    CC:  headaches  History provided from self, daughter in law  HPI:  Medical co-morbidities: CAD, HTN, OSA on BiPAP, COPD, HLD, DM2, hypothyroidism, PE on Eliquis  The patient presents for evaluation of headaches which began in December 2023 after she developed COVID. Headaches is described as a constant left-sided aching in her left occiput which radiates forward and down her neck. Pain does not radiate down her arms. She reports paresthesias and weak grip strength in bilateral hands due to her neuropathy. Headache fluctuates in severity and is worsened by the cold and by turning her head. No photophobia, phonophobia, or nausea. Has improved a little since the summer but continues to be daily.   Her PCP started Topamax for headache prevention, however she developed confusion so this was discontinued. She takes gabapentin 600 mg BID for neuropathy but this doesn't seem to help her headaches. Takes Tylenol as needed but this is not very effective.  Headache History: Onset: December 2023 Triggers: cold, turning her head Aura: no Location: left occiput radiating forward Quality/Description: aching Associated Symptoms:  Photophobia: no  Phonophobia: no   Nausea: no Worse with activity?:  yes Duration of headaches: constant  Headache days per month: 30 Headache free days per month: 0  Current Treatment: Abortive Tylenol  Preventative none  Prior Therapies                                 Topamax - confusion Gabapentin 600 mg BID - lack of efficacy Metoprolol 25 mg BID Lexapro 10 mg daily Lisinopril  Losartan - hypokalemia   LABS: CBC    Component Value Date/Time   WBC 5.8  07/11/2022 0943   RBC 5.02 07/11/2022 0943   HGB 14.5 07/11/2022 0943   HCT 45.3 (H) 07/11/2022 0943   PLT 222 07/11/2022 0943   MCV 90.2 07/11/2022 0943   MCH 28.9 07/11/2022 0943   MCHC 32.0 07/11/2022 0943   RDW 12.5 07/11/2022 0943   LYMPHSABS 2,384 07/11/2022 0943   MONOABS 0.6 07/06/2022 1101   EOSABS 70 07/11/2022 0943   BASOSABS 29 07/11/2022 0943      Latest Ref Rng & Units 07/11/2022    9:43 AM 07/08/2022   12:39 AM 07/07/2022    7:49 AM  CMP  Glucose 65 - 99 mg/dL 956  387  564   BUN 7 - 25 mg/dL 10  10  8    Creatinine 0.60 - 0.95 mg/dL 3.32  9.51  8.84   Sodium 135 - 146 mmol/L 143  137  141   Potassium 3.5 - 5.3 mmol/L 3.7  3.5  2.6   Chloride 98 - 110 mmol/L 103  105  104   CO2 20 - 32 mmol/L 30  25  27    Calcium 8.6 - 10.4 mg/dL 16.6  8.8  9.6    0/63/01 ESR 6   IMAGING:  MRI brain 07/06/22:  1. No acute intracranial abnormality. 2. Findings of chronic small vessel ischemia.  Imaging independently reviewed on October 11, 2022   Current Outpatient Medications on File Prior to Visit  Medication Sig Dispense Refill  apixaban (ELIQUIS) 5 MG TABS tablet Take 1 tablet (5 mg total) by mouth 2 (two) times daily. 180 tablet 1   calcitonin, salmon, (MIACALCIN/FORTICAL) 200 UNIT/ACT nasal spray Place into alternate nostrils.     Calcium Carbonate-Vitamin D (CALCIUM 600 + D PO) Take 1 tablet by mouth daily.     escitalopram (LEXAPRO) 10 MG tablet Take 1 tablet (10 mg total) by mouth daily. 30 tablet 3   gabapentin (NEURONTIN) 600 MG tablet TAKE 1 TABLET BY MOUTH 4 TIMES DAILY (Patient taking differently: Take 600 mg by mouth 2 (two) times daily.) 360 tablet 0   isosorbide mononitrate (IMDUR) 60 MG 24 hr tablet TAKE ONE TABLET BY MOUTH ONCE DAILY. (NEED APPT FOR FUTURE REFILLS) 30 tablet 11   LANTUS SOLOSTAR 100 UNIT/ML Solostar Pen INJECT 40-50 UNITS UNDER THE SKIN ONCE EVERY NIGHT AT BEDTIME. 15 mL 3   metFORMIN (GLUCOPHAGE) 500 MG tablet TAKE 1 TABLET BY MOUTH TWICE  (2) DAILY WITH A MEAL 180 tablet 3   metoprolol tartrate (LOPRESSOR) 25 MG tablet TAKE ONE TABLET BY MOUTH TWICE A DAY 180 tablet 3   Omega-3 Fatty Acids (FISH OIL) 1000 MG CAPS Take 1 capsule by mouth daily.     omeprazole (PRILOSEC) 20 MG capsule Take 20 mg by mouth daily.     ONETOUCH VERIO test strip USE AS DIRECTED TO MONITOR BLOOD SUGARS UP TO 3 TIMES DAILY AS NEEDED 100 each 3   rosuvastatin (CRESTOR) 40 MG tablet TAKE ONE TABLET BY MOUTH ONCE DAILY 90 tablet 2   traMADol (ULTRAM) 50 MG tablet TAKE ONE TABLET BY MOUTH AT BEDTIME AS NEEDED FOR SEVERE PAIN 30 tablet 0   triamcinolone (KENALOG) 0.1 % paste Use as directed 1 Application in the mouth or throat 2 (two) times daily. 5 g 12   No current facility-administered medications on file prior to visit.     Allergies: Allergies  Allergen Reactions   Neomycin Other (See Comments)    Visual disturbance and swelling in eyes    Family History: Family History  Problem Relation Age of Onset   Heart disease Mother        developed coronary dzs in her 31's.   Clotting disorder Mother    Breast cancer Sister 66   Diabetes Paternal Aunt    Asthma Maternal Grandmother    Heart disease Maternal Grandmother    Heart disease Maternal Grandfather    Diabetes Brother    CAD Brother        s/p cabg in his 64's   CAD Brother        s/p cabg in his 27's.    Past Medical History: Past Medical History:  Diagnosis Date   Abnormal weight gain    Acute diverticulitis 05/13/2021   Arthritis    "joints" (06/13/2013)   COPD (chronic obstructive pulmonary disease) (HCC)    Coronary artery disease, non-occlusive 06/13/2013   Cardiac Cath: sharp Angle take-off of Large Dominant Cx: ~70-80% mid Cx bifurcation lesion (Lateral OM &  AVGCx-PL-PDA both with hairpin ostial & ~50% lesions) - Not PCI amenable due to vessel tortuosity; ~40-50% mid LAD;Ramus - no significnat diseaes; small non-dominant RCA   Diverticulosis    GERD (gastroesophageal  reflux disease)    History of stress test    a. 09/2006 nl Dobutamine Echo   Hyperlipidemia    Hypertension    NIDDM (non-insulin dependent diabetes mellitus)    On home oxygen therapy    "2L q hs; runs into  my BIPAP" (06/13/2013)   OSA (obstructive sleep apnea)    "BIPAP w/O2" (06/13/2013)   Tracheobronchomalacia    a. followed by [facility].    Past Surgical History Past Surgical History:  Procedure Laterality Date   CARDIAC CATHETERIZATION  06/2013   Cardiac Cath: sharp Angle take-off of Large Dominant Cx: ~70-80% mid Cx bifurcation lesion (Lateral OM &  AVGCx-PL-PDA both with hairpin ostial & ~50% lesions) - Not PCI amenable due to vessel tortuosity; ~40-50% mid LAD;Ramus - no significnat diseaes; small non-dominant RCA   LEFT HEART CATHETERIZATION WITH CORONARY ANGIOGRAM N/A 06/16/2013   Procedure: LEFT HEART CATHETERIZATION WITH CORONARY ANGIOGRAM;  Surgeon: Marykay Lex, MD;  Location[facility] CATH LAB;  Service: Cardiovascular;  Laterality: N/A;   TRANSTHORACIC ECHOCARDIOGRAM  06/17/2013   Normal LV size and function. EF 55-60% with no regional WMA. Grade 1 diastolic dysfunction. No significant valvular lesions   TUBAL LIGATION      Social History: Social History   Tobacco Use   Smoking status: Former    Current packs/day: 0.00    Average packs/day: 1.5 packs/day for 20.0 years (30.0 ttl pk-yrs)    Types: Cigarettes    Start date: 02/13/1966    Quit date: 02/13/1986    Years since quitting: 36.6   Smokeless tobacco: Never  Vaping Use   Vaping status: Never Used  Substance Use Topics   Alcohol use: No   Drug use: No    ROS: Negative for fevers, chills. Positive for headaches. All other systems reviewed and negative unless stated otherwise in HPI.   Physical Exam:   Vital Signs: BP 124/69 (BP Location: Left Arm, Patient Position: Sitting, Cuff Size: Small)   Pulse (!) 56   Ht 5' (1.524 m)   Wt 130 lb (59 kg)   LMP  (LMP Unknown)   BMI 25.39 kg/m  GENERAL: well  appearing,in no acute distress,alert SKIN:  Color, texture, turgor normal. No rashes or lesions HEAD:  Normocephalic/atraumatic. CV:  RRR RESP: Normal respiratory effort MSK: +tenderness to palpation over left occiput and neck. Very limited range of motion turning neck left or right  NEUROLOGICAL: Mental Status: Alert, oriented to person, place and time,Follows commands Cranial Nerves: PERRL, visual fields intact to confrontation, extraocular movements intact, facial sensation intact, no facial droop or ptosis, hearing grossly intact, no dysarthria Motor: muscle strength 5/5 both upper and lower extremities,no drift, normal tone Reflexes: 2+ throughout Sensation: decreased sensation to light touch/pinprick in stocking-glove pattern Coordination: Finger-to- nose-finger intact bilaterally Gait: normal-based   IMPRESSION: 82 year old female with a history of CAD, HTN, OSA on BiPAP, COPD, HLD, DM2 c/b neuropathy, hypothyroidism, PE on Eliquis who presents for evaluation of left-sided headaches for the past 8 months. Brain MRI and ESR normal. 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 Will refer to neck PT for this and to help improve her cervical range of motion. Will add 300 mg of gabapentin in the afternoon. She does not report radiating pain down the arms. Does have decreased sensation in her hands and decreased grip strength, but this may be from her baseline neuropathy. If no improvement with PT and medication may consider MRI C-spine.  PLAN: -Increase gabapentin to 600/300/600 -Referral to neck PT -Next steps: consider occipital nerve block, PRN baclofen   I spent a total of 33 minutes chart reviewing and counseling the patient. Headache education was done. Discussed treatment options including preventive and physical therapy. Discussed medication side effects, adverse reactions and drug interactions. Written  educational materials and patient instructions outlining all of the above  were given.  Follow-up: 6 months   Ocie Doyne, MD 10/11/2022   10:55 AM

## 2022-10-22 DIAGNOSIS — J9601 Acute respiratory failure with hypoxia: Secondary | ICD-10-CM | POA: Diagnosis not present

## 2022-10-24 NOTE — Therapy (Unsigned)
OUTPATIENT PHYSICAL THERAPY EVALUATION   Patient Name: April [name] MRN: [id number] DOB:11-19-1940, 82 y.o., female Today's Date: 10/25/2022  END OF SESSION:  PT End of Session - 10/25/22 2105     Visit Number 1    Number of Visits 13    Date for PT Re-Evaluation 01/17/23    Authorization Type [employer] MEDICARE reporting period from 10/25/2022    Progress Note Due on Visit 10    PT Start Time 1737    PT Stop Time 1825    PT Time Calculation (min) 48 min    Activity Tolerance Patient tolerated treatment well    Behavior During Therapy [facility] for tasks assessed/performed             Past Medical History:  Diagnosis Date   Abnormal weight gain    Acute diverticulitis 05/13/2021   Arthritis    "joints" (06/13/2013)   COPD (chronic obstructive pulmonary disease) (HCC)    Coronary artery disease, non-occlusive 06/13/2013   Cardiac Cath: sharp Angle take-off of Large Dominant Cx: ~70-80% mid Cx bifurcation lesion (Lateral OM &  AVGCx-PL-PDA both with hairpin ostial & ~50% lesions) - Not PCI amenable due to vessel tortuosity; ~40-50% mid LAD;Ramus - no significnat diseaes; small non-dominant RCA   Diverticulosis    GERD (gastroesophageal reflux disease)    History of stress test    a. 09/2006 nl Dobutamine Echo   Hyperlipidemia    Hypertension    NIDDM (non-insulin dependent diabetes mellitus)    On home oxygen therapy    "2L q hs; runs into my BIPAP" (06/13/2013)   OSA (obstructive sleep apnea)    "BIPAP w/O2" (06/13/2013)   Tracheobronchomalacia    a. followed by [facility]   Past Surgical History:  Procedure Laterality Date   CARDIAC CATHETERIZATION  06/2013   Cardiac Cath: sharp Angle take-off of Large Dominant Cx: ~70-80% mid Cx bifurcation lesion (Lateral OM &  AVGCx-PL-PDA both with hairpin ostial & ~50% lesions) - Not PCI amenable due to vessel tortuosity; ~40-50% mid LAD;Ramus - no significnat diseaes; small non-dominant RCA   LEFT HEART CATHETERIZATION WITH CORONARY  ANGIOGRAM N/A 06/16/2013   Procedure: LEFT HEART CATHETERIZATION WITH CORONARY ANGIOGRAM;  Surgeon: April Lex, MD;  Location: [facility] CATH LAB;  Service: Cardiovascular;  Laterality: N/A;   TRANSTHORACIC ECHOCARDIOGRAM  06/17/2013   Normal LV size and function. EF 55-60% with no regional WMA. Grade 1 diastolic dysfunction. No significant valvular lesions   TUBAL LIGATION     Patient Active Problem List   Diagnosis Date Noted   Acute metabolic encephalopathy 07/07/2022   Acute encephalopathy 07/06/2022   Nausea vomiting and diarrhea 07/06/2022   Altered mental status 07/05/2022   Falls 05/01/2022   Acute pulmonary embolism (HCC) 04/18/2022   Cholelithiasis 04/18/2022   Wheezing 04/18/2022   Acute respiratory failure with hypoxia (HCC) 04/18/2022   Age-related osteoporosis with current pathological fracture, vertebra(e), initial encounter for fracture (HCC) 04/04/2022   Inflammation of sacroiliac joint (HCC) 04/04/2022   Polyneuropathy 04/04/2022   Subacute cough 03/28/2022   Pedal edema 11/04/2020   Scoliosis of lumbar spine 08/04/2020   Lumbar spondylosis 08/04/2020   Radiculopathy, lumbar region 05/19/2020   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) 07/01/2013   Hypothyroid 06/16/2013   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   Unstable angina - history of 06/13/2013   Hyperlipidemia associated with type 2 diabetes mellitus (HCC)    GERD (gastroesophageal reflux disease)    Essential hypertension  Type 2 diabetes mellitus treated with insulin (HCC)    Dyspnea    Diverticulosis    HOARSENESS 06/30/2009   OSA treated with BiPAP 01/01/2007   WEIGHT GAIN 01/01/2007    PCP:  April Brooks, MD  REFERRING PROVIDER: Ocie Doyne, MD   REFERRING DIAG: cervicalgia  THERAPY DIAG:  Cervicalgia  Nonintractable headache, unspecified chronicity pattern, unspecified headache type  Neuralgia and neuritis  Rationale for Evaluation and  Treatment: Rehabilitation  ONSET DATE: Christmas season 2023.   SUBJECTIVE:                                                                                                                                                                                                         SUBJECTIVE STATEMENT: Patient is here with her DIL April [name] who contributes to history as appropriate. Patient states she is here for a headache that has been there for several months. She is having some difficulty turning her head. April [name] states that she had COVID19 around Christmas and a couple of hospitalizations in March and May. Her headache and neck stiffness started around the time she had COVID19 and has continued to be a problem through the hospitalizations. She had home health PT and OT after the first hospitalization, but they did not really focus on her neck pain or headache or the low back back pain she started having after she fell out of the bed back in January. She also fell off the commode after that but before people started staying with her. Her neck and head pain is always on the left [name]. She states it helps if she mashes on the mastoid process behind her left ear. She states she can turn her head left further when she holds that spot. She reports numbness and tingling in her hands and feet at baseline. She reports her left arm has been sore to touch and she has to position herself just right to be able to sleep on her left [name]. She does not remember having this arm pain prior to her neck pain. She states her head/neck pain is worse in the winter time.    Hand dominance: Right  PERTINENT HISTORY:  Patient is a 82 y.o. female who presents to outpatient physical therapy with a referral for medical diagnosis cervicalgia. This patient's chief complaints consist of left sided neck pain and stiffness and headache that is worst near the mastoid process and intermittently radiates to the top of her head, temple, and  behind left eye, and left UE pain leading to the following functional deficits:  difficulty feeling like "doing much of nothing," watching TV, being with family, crosswords, play solitaire on her phone, turning her head, flexing or extending neck, prolonged sitting, traveling. 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 She  has a past medical history of Abnormal weight gain, Acute diverticulitis (05/13/2021), Arthritis, COPD (chronic obstructive pulmonary disease) (HCC), Coronary artery disease, non-occlusive (06/13/2013), Diverticulosis, GERD (gastroesophageal reflux disease), History of stress test, Hyperlipidemia, Hypertension, NIDDM (non-insulin dependent diabetes mellitus), OSA (obstructive sleep apnea), and Tracheobronchomalacia. She  has a past surgical history that includes Tubal ligation; Cardiac catheterization (06/2013); transthoracic echocardiogram (06/17/2013); and left heart catheterization with coronary angiogram (N/A, 06/16/2013).  Patient/DIL denies hx of cancer, stroke, seizures,  unexplained weight loss, unexplained changes in bowel or bladder problems, unexplained stumbling or dropping things, and spinal surgery.  PAIN:  Are you having pain? Yes NPRS: Current: 4/10,  Best: 0/10 (1-2x in the last 6 months), Worst: 8-9/10. Pain location: left upper cervical spine near mastoid process radiating down left [name] of neck over upper trap, and up over top of head to behind left eye. Does report her left arm gets sore. Pain and headaches are daily. Occasionally gets to 0/10 "not very often." Pain description: "it's not really like a headache" "I don't really know" denies throbbing. "It's just a slow aching pain" Aggravating factors: cold air temperature, turning her head, noise.  Relieving factors: cold pack, pressing on the spot on her mastoid process that hurts, tylenol takes the edge off, middle of the day is better when it is warmer outside, she did notice she felt better when using home oxygen although there was no difference in her SpO2 measured by her family.   FUNCTIONAL LIMITATIONS: difficulty feeling like "doing much of nothing," watching TV, being with family, crosswords, play solitaire on her phone, turning her head, flexing or extending neck, prolonged sitting, traveling.   LEISURE: watching TV, being with family, crosswords, play solitaire on her phone  PRECAUTIONS: Fall  WEIGHT BEARING RESTRICTIONS: No  FALLS:  Has patient fallen in last 6 months? No Is really careful to prevent falls. She uses a [facility] or rollator when she goes out in the community. She holds onto the furniture and walls to help keep balance at home.   LIVING ENVIRONMENT: Lives with: alone; one of her 3 boys comes by every day.  Lives in: one story mobile home Stairs: 5 steps with bilateral handrails (unable to reach both at once) or a ramp with B handrails to enter.  Has following equipment at home: Single point cane, Walker - 4 wheeled, shower chair, Grab bars, and Ramped entry, hospital bed  with rails, over the toilet commode, walk-in shower.   OCCUPATION: retired; worked at [employer].   PLOF: mostly I with ADLs and ambulates with SPC or walker outside the home, holding on to furniture in the  home.    PATIENT GOALS: "want to get her head to quit hurting and would like to get my back to quit  hurting"   NEXT MD VISIT: 6 months from the last visit  OBJECTIVE  DIAGNOSTIC FINDINGS:  Brain MRI report from 07/06/2022:  CLINICAL DATA:  Altered mental status   EXAM: MRI HEAD WITHOUT CONTRAST   IMPRESSION: 1. No acute intracranial abnormality. 2. Findings of chronic small vessel ischemia.  OBSERVATION/INSPECTION Posture Posture (seated): forward head, rounded shoulders, slumped in sitting. Significnat increase in thoracic kyphosis, Right shoulder significantly lower than left.   Posture (standing): stooped.  Anthropometrics Tremor: none Body composition: low muscle mass throughout body Functional Mobility Bed mobility: sit to supine mod I, supine to sit min A  Transfers: sit <> stand mod I due to increased time/effort with use of B UE on chair arms/plinth. Gait: ambulates in clinic and to vehicle with SPC and stooped posture, careful pace and quality while being accompanied by DIL. More detailed gait analysis to be performed in the future as needed.   SPINE MOTION CERVICAL SPINE AROM *Indicates pain Flexion: 65 Extension: 45 movement mostly from upper cervical spine, increased concordant pain to the left temple and left neck.  [name] Flexion:   R 32  L 45 Rotation:  R 67 left concordant pain L 50 left concordant pain +   PERIPHERAL JOINT MOTION (in degrees)  ACTIVE RANGE OF MOTION (AROM) 10/25/2022: B UE grossly WFL for basic mobility except pain in left arm with shoulder motions and slightly less overhead motion than right.   MUSCLE PERFORMANCE (MMT):  *Indicates pain 10/25/22 Date Date  Joint/Motion R/L R/L R/L  Shoulder     Flexion / / /   Abduction (C5) / / /  External rotation / / /  Internal rotation / / /  Extension / / /  Elbow     Flexion (C6) / / /  Extension (C7) / / /  Hand     Thumb extension (C8) B 4-/5* / /  Finger abduction (T1) B WFL / /  Comments:  10/25/2022: further testing deferred to prevent unnecessary irritation and due to time limitations.   SPECIAL TESTS: CERVICAL SPINE Cervical spine axial compression: negative Spurling's part B:  R = negative, L = positive for localized pain Cervical spine axial distraction: no effect.  ** all with very gentle pressure  PALPATION: TTP at left suboccipital muscles and cervical paraspinals with reproduction of concordant pain  TTP at bilateral UT but not concordant   ACCESSORY MOTION - Gentle cervical spine traction initially felt good, then increased pain - TTP along cervical spinous processes at all level, minimal pressure applied, non-concordant.    TODAY'S TREATMENT: Therapeutic exercise: to centralize symptoms and improve ROM, strength, muscular endurance, and activity tolerance required for successful completion of functional activities.  - hooklying deep neck flexor nods, 1x3 with 10 second holds plus more reps to learn how to perform without activating SCM.  - seated cervical spine retraction AROM, 1x10 - seated scapular retraction, 1x5 - Education on diagnosis, prognosis, POC, anatomy and physiology of current condition.  - Education on HEP including handout   Manual therapy: to reduce pain and tissue tension, improve range of motion, neuromodulation, in order to promote improved ability to complete functional activities. HOOKLYING - STM with sustained pressure as tolerated to left suboccipital region.   Pt required multimodal cuing for proper technique and to facilitate improved neuromuscular control, strength, range of motion, and functional  ability resulting in improved performance and form.  PATIENT EDUCATION:  Education details:  Exercise purpose/form. Self management techniques. Education on diagnosis, prognosis, POC, anatomy and physiology of current condition. Education on HEP including handout. Person educated: Patient and Caregiver DIL April [name] Education method: Explanation, Demonstration, Tactile cues, Verbal cues, and Handouts Education comprehension: verbalized understanding, returned demonstration, and needs further education  HOME EXERCISE PROGRAM: Access Code: [email or web address] URL: [email or web address] Date: 10/25/2022 Prepared by: Norton Blizzard  Exercises - Supine Deep Neck Flexor Nods  - 1-2 x daily - 1 sets - 10 reps - 10 seconds hold - Seated Cervical Retraction  - 1-2 x daily - 3 sets - 10 reps - 1 seconds hold - Seated Scapular Retraction  - 1-2 x daily - 3 sets - 10 reps - 1 second hold  ASSESSMENT:  CLINICAL IMPRESSION: Patient is a 82 y.o. female referred to outpatient physical therapy with a medical diagnosis of cervicalgia who presents with signs and symptoms consistent with left sided neck pain with radiation to the left [name] of the head reproducing patient's headache complaint. Patient also has limited AROM especially into left rotation that is associated with the neck pain and headache and left arm discomfort possibly radiating from left sided neck. Patient presents with significant posture, joint stiffness, ROM, motor control, muscle tension, muscle performance (strength/power/endurance) and activity tolerance impairments that are limiting ability to complete usual activities such as watching TV, being with family, crosswords, play solitaire on her phone, turning her head, flexing or extending neck, prolonged sitting, traveling without difficulty and making her feel like "doing much of nothing." Patient will benefit from skilled physical therapy intervention to address current body structure impairments and activity limitations to improve function and work towards goals set in current POC in  order to return to prior level of function or maximal functional improvement.    OBJECTIVE IMPAIRMENTS: Abnormal gait, decreased activity tolerance, decreased cognition, decreased coordination, decreased endurance, decreased knowledge of condition, decreased mobility, difficulty walking, decreased ROM, decreased strength, hypomobility, impaired perceived functional ability, increased muscle spasms, impaired flexibility, impaired UE functional use, improper body mechanics, postural dysfunction, and pain.   ACTIVITY LIMITATIONS: carrying, lifting, sitting, sleeping, transfers, bed mobility, bathing, dressing, hygiene/grooming, and locomotion level  PARTICIPATION LIMITATIONS: meal prep, cleaning, interpersonal relationship, shopping, community activity, and   difficulty feeling like "doing much of nothing," watching TV, being with family, crosswords, play solitaire on her phone, turning her head, flexing or extending neck, prolonged sitting, traveling  PERSONAL FACTORS: Age, Fitness, Past/current experiences, Time since onset of injury/illness/exacerbation, and 3+ comorbidities:   OSA treated with BiPAP; WEIGHT GAIN; HOARSENESS; Hyperlipidemia associated with type 2 diabetes mellitus (HCC); GERD (gastroesophageal reflux disease); Essential hypertension; Type 2 diabetes mellitus treated with insulin (HCC); Dyspnea; Diverticulosis; Unstable angina - history of; Hypothyroid; Tracheobronchomalacia; 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); Pedal edema; Scoliosis of lumbar spine; Lumbar spondylosis; Radiculopathy, lumbar region; Subacute cough; Age-related osteoporosis with current pathological fracture, vertebra(e), initial encounter for fracture (HCC); Inflammation of sacroiliac joint (HCC); Polyneuropathy; Acute pulmonary embolism (HCC); Cholelithiasis; Wheezing; Acute respiratory failure with hypoxia (HCC); Falls; Altered mental status; Acute encephalopathy; Nausea  vomiting and diarrhea; and Acute metabolic encephalopathy on their problem list. She  has a past medical history of Abnormal weight gain, Acute diverticulitis (05/13/2021), Arthritis, COPD (chronic obstructive pulmonary disease) (HCC), Coronary artery disease, non-occlusive (06/13/2013), Diverticulosis, GERD (gastroesophageal reflux disease), History of stress test, Hyperlipidemia, Hypertension, NIDDM (non-insulin dependent diabetes mellitus), OSA (obstructive sleep apnea), and Tracheobronchomalacia.  She  has a past surgical history that includes Tubal ligation; Cardiac catheterization (06/2013); transthoracic echocardiogram (06/17/2013); and left heart catheterization with coronary angiogram (N/A, 06/16/2013) are also affecting patient's functional outcome.   REHAB POTENTIAL: Good  CLINICAL DECISION MAKING: Evolving/moderate complexity  EVALUATION COMPLEXITY: Moderate   GOALS: Goals reviewed with patient? No  SHORT TERM GOALS: Target date: 11/08/2022  Patient will be independent with initial home exercise program for self-management of symptoms. Baseline: Initial HEP provided at IE (10/25/22); Goal status: INITIAL   LONG TERM GOALS: Target date: 01/17/2023  Patient will be independent with a long-term home exercise program for self-management of symptoms.  Baseline: Initial HEP provided at IE (10/25/22); Goal status: INITIAL  2.  Patient will demonstrate improved FOTO by equal or greater than 10 points by visit #10 to demonstrate improvement in overall condition and self-reported functional ability.  Baseline: to be measured visit 2 as appropriate (10/25/22); Goal status: INITIAL  3.  Patient will improve cervical spine rotation to equal or greater than 60 degrees each direction without increased pain to improve ability to view her surroundings without difficulty.  Baseline: R/L 67/50 with concordant pain (10/25/22); Goal status: INITIAL  4.  Patient will score equal or greater than 26  seconds on the Deep Neck Flexor Endurance Test to demonstrate improved neck strength/endurance and reach age matched norm in healthy women to improve her ability to complete her daily activities with less difficulty due to neck pain associated with fatigue.  Baseline: to be tested visit 2 as appropriate (10/25/22); Goal status: INITIAL  5.  Patient will complete community, work and/or recreational activities with 50% less limitation due to current condition.  Baseline: difficulty feeling like "doing much of nothing," watching TV, being with family, crosswords, play solitaire on her phone, turning her head, flexing or extending neck, prolonged sitting, traveling (10/25/22); Goal status: INITIAL   PLAN:  PT FREQUENCY: 1-2x/week  PT DURATION: 12 weeks  PLANNED INTERVENTIONS: Therapeutic exercises, Therapeutic activity, Neuromuscular re-education, Patient/Family education, Self Care, DME instructions, Dry Needling, Electrical stimulation, Cryotherapy, Moist heat, Manual therapy, and Re-evaluation  PLAN FOR NEXT SESSION: update HEP as appropriate, progressive postural/UE/functional strengthening, motor control, and ROM exercises as appropriate. Education. Manual therapy as needed.    Cira Rue, PT, DPT 10/25/2022, 9:34 PM  [facility] [address] [place], [place] [address] P: [phone] I F: [phone]

## 2022-10-25 ENCOUNTER — Ambulatory Visit: Payer: Medicare Other | Attending: Psychiatry | Admitting: Physical Therapy

## 2022-10-25 ENCOUNTER — Encounter: Payer: Self-pay | Admitting: Physical Therapy

## 2022-10-25 DIAGNOSIS — M792 Neuralgia and neuritis, unspecified: Secondary | ICD-10-CM | POA: Diagnosis not present

## 2022-10-25 DIAGNOSIS — M542 Cervicalgia: Secondary | ICD-10-CM | POA: Diagnosis not present

## 2022-10-25 DIAGNOSIS — R519 Headache, unspecified: Secondary | ICD-10-CM | POA: Insufficient documentation

## 2022-10-26 NOTE — Therapy (Deleted)
OUTPATIENT PHYSICAL THERAPY TREATMENT   Patient Name: April [name] MRN: [id number] DOB:1940-02-22, 82 y.o., female Today's Date: 10/26/2022  END OF SESSION:    Past Medical History:  Diagnosis Date   Abnormal weight gain    Acute diverticulitis 05/13/2021   Arthritis    "joints" (06/13/2013)   COPD (chronic obstructive pulmonary disease) (HCC)    Coronary artery disease, non-occlusive 06/13/2013   Cardiac Cath: sharp Angle take-off of Large Dominant Cx: ~70-80% mid Cx bifurcation lesion (Lateral OM &  AVGCx-PL-PDA both with hairpin ostial & ~50% lesions) - Not PCI amenable due to vessel tortuosity; ~40-50% mid LAD;Ramus - no significnat diseaes; small non-dominant RCA   Diverticulosis    GERD (gastroesophageal reflux disease)    History of stress test    a. 09/2006 nl Dobutamine Echo   Hyperlipidemia    Hypertension    NIDDM (non-insulin dependent diabetes mellitus)    On home oxygen therapy    "2L q hs; runs into my BIPAP" (06/13/2013)   OSA (obstructive sleep apnea)    "BIPAP w/O2" (06/13/2013)   Tracheobronchomalacia    a. followed by [facility]   Past Surgical History:  Procedure Laterality Date   CARDIAC CATHETERIZATION  06/2013   Cardiac Cath: sharp Angle take-off of Large Dominant Cx: ~70-80% mid Cx bifurcation lesion (Lateral OM &  AVGCx-PL-PDA both with hairpin ostial & ~50% lesions) - Not PCI amenable due to vessel tortuosity; ~40-50% mid LAD;Ramus - no significnat diseaes; small non-dominant RCA   LEFT HEART CATHETERIZATION WITH CORONARY ANGIOGRAM N/A 06/16/2013   Procedure: LEFT HEART CATHETERIZATION WITH CORONARY ANGIOGRAM;  Surgeon: Marykay Lex, MD;  Location: [facility] CATH LAB;  Service: Cardiovascular;  Laterality: N/A;   TRANSTHORACIC ECHOCARDIOGRAM  06/17/2013   Normal LV size and function. EF 55-60% with no regional WMA. Grade 1 diastolic dysfunction. No significant valvular lesions   TUBAL LIGATION     Patient Active Problem List   Diagnosis Date Noted    Acute metabolic encephalopathy 07/07/2022   Acute encephalopathy 07/06/2022   Nausea vomiting and diarrhea 07/06/2022   Altered mental status 07/05/2022   Falls 05/01/2022   Acute pulmonary embolism (HCC) 04/18/2022   Cholelithiasis 04/18/2022   Wheezing 04/18/2022   Acute respiratory failure with hypoxia (HCC) 04/18/2022   Age-related osteoporosis with current pathological fracture, vertebra(e), initial encounter for fracture (HCC) 04/04/2022   Inflammation of sacroiliac joint (HCC) 04/04/2022   Polyneuropathy 04/04/2022   Subacute cough 03/28/2022   Pedal edema 11/04/2020   Scoliosis of lumbar spine 08/04/2020   Lumbar spondylosis 08/04/2020   Radiculopathy, lumbar region 05/19/2020   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  Rationale for Evaluation and Treatment: Rehabilitation  ONSET DATE: Christmas season 2023.   PERTINENT HISTORY:  Patient is a 82 y.o. female who presents to outpatient physical therapy with a referral for medical diagnosis cervicalgia. This patient's chief complaints  consist of left sided neck pain and stiffness and headache that is worst near the mastoid process and intermittently radiates to the top of her head, temple, and behind left eye, and left UE pain leading to the following functional deficits: difficulty feeling like "doing much of nothing," watching  TV, being with family, crosswords, play solitaire on her phone, turning her head, flexing or extending neck, prolonged sitting, traveling. 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 She  has a past medical history of Abnormal weight gain, Acute diverticulitis (05/13/2021), Arthritis, COPD (chronic obstructive pulmonary disease) (HCC), Coronary artery disease, non-occlusive (06/13/2013), Diverticulosis, GERD (gastroesophageal reflux disease), History of stress test, Hyperlipidemia, Hypertension, NIDDM (non-insulin dependent diabetes mellitus), OSA (obstructive sleep apnea), and Tracheobronchomalacia. She  has a past surgical history that includes Tubal ligation; Cardiac catheterization (06/2013); transthoracic echocardiogram (06/17/2013); and left heart catheterization with coronary angiogram (N/A, 06/16/2013).  Patient/DIL denies hx of cancer, stroke, seizures, unexplained weight loss, unexplained changes in bowel or bladder problems, unexplained stumbling or dropping things, and spinal  surgery.  SUBJECTIVE:                                                                                                                                                                                                         SUBJECTIVE STATEMENT: ***  PAIN:  NPRS: ***  PRECAUTIONS: Fall   PATIENT GOALS: "want to get her head to quit hurting and would like to get my back to quit  hurting"   NEXT MD VISIT: 6 months from the last visit  OBJECTIVE  There were no vitals filed for this visit.   MUSCLE PERFORMANCE (MMT):  *Indicates pain 10/25/22 Date Date  Joint/Motion R/L R/L R/L  Shoulder     Flexion / / /  Abduction (C5) / / /  External rotation / / /  Internal rotation / / /  Extension / / /  Elbow  Flexion (C6) / / /  Extension (C7) / / /  Hand     Thumb extension (C8) B 4-/5* / /  Finger abduction (T1) B WFL / /  Comments:  10/25/2022: further testing deferred to prevent unnecessary irritation and due to time limitations.   FUNCTIONAL TESTS - Deep Neck Flexor Endurance Test: ***    TODAY'S TREATMENT: Therapeutic exercise: to centralize symptoms and improve ROM, strength, muscular endurance, and activity tolerance required for successful completion of functional activities.  - hooklying deep neck flexor nods, 1x3 with 10 second holds plus more reps to learn how to perform without activating SCM.  - seated cervical spine retraction AROM, 1x10 - seated scapular retraction, 1x5 - Education on diagnosis, prognosis, POC, anatomy and physiology of current condition.  - Education on HEP including handout   Manual therapy: to reduce pain and tissue tension, improve range of motion, neuromodulation, in order to promote improved ability to complete functional activities. HOOKLYING - STM with sustained pressure as tolerated to left suboccipital region.   Pt required multimodal cuing for proper technique and to facilitate improved neuromuscular control, strength, range of  motion, and functional ability resulting in improved performance and form.  PATIENT EDUCATION:  Education details: Exercise purpose/form. Self management techniques. Education on diagnosis, prognosis, POC, anatomy and physiology of current condition. Education on HEP including handout. Person educated: Patient and Caregiver DIL [name] Education method: Explanation, Demonstration, Tactile cues, Verbal cues, and Handouts Education comprehension: verbalized understanding, returned demonstration, and needs further education  HOME EXERCISE PROGRAM: Access Code: [email or web address] URL: [email or web address] Date: 10/25/2022 Prepared by: Norton Blizzard  Exercises - Supine Deep Neck Flexor Nods  - 1-2 x daily - 1 sets - 10 reps - 10 seconds hold - Seated Cervical Retraction  - 1-2 x daily - 3 sets - 10 reps - 1 seconds hold - Seated Scapular Retraction  - 1-2 x daily - 3 sets - 10 reps - 1 second hold  ASSESSMENT:  CLINICAL IMPRESSION:   From Initial PT Evaluation 10/25/2022:  Patient is a 82 y.o. female referred to outpatient physical therapy with a medical diagnosis of cervicalgia who presents with signs and symptoms consistent with left sided neck pain with radiation to the left side of the head reproducing patient's headache complaint. Patient also has limited AROM especially into left rotation that is associated with the neck pain and headache and left arm discomfort possibly radiating from left sided neck. Patient presents with significant posture, joint stiffness, ROM, motor control, muscle tension, muscle performance (strength/power/endurance) and activity tolerance impairments that are limiting ability to complete usual activities such as watching TV, being with family, crosswords, play solitaire on her phone, turning her head, flexing or extending neck, prolonged sitting, traveling without difficulty and making her feel like "doing much of nothing." Patient will benefit from skilled  physical therapy intervention to address current body structure impairments and activity limitations to improve function and work towards goals set in current POC in order to return to prior level of function or maximal functional improvement.    OBJECTIVE IMPAIRMENTS: Abnormal gait, decreased activity tolerance, decreased cognition, decreased coordination, decreased endurance, decreased knowledge of condition, decreased mobility, difficulty walking, decreased ROM, decreased strength, hypomobility, impaired perceived functional ability, increased muscle spasms, impaired flexibility, impaired UE functional use, improper body mechanics, postural dysfunction, and pain.   ACTIVITY LIMITATIONS: carrying, lifting, sitting, sleeping, transfers, bed mobility, bathing, dressing, hygiene/grooming, and locomotion level  PARTICIPATION LIMITATIONS: meal prep, cleaning, interpersonal relationship, shopping, community activity, and  difficulty feeling like "doing much of nothing," watching TV, being with family, crosswords, play solitaire on her phone, turning her head, flexing or extending neck, prolonged sitting, traveling  PERSONAL FACTORS: Age, Fitness, Past/current experiences, Time since onset of injury/illness/exacerbation, and 3+ comorbidities:   OSA treated with BiPAP; WEIGHT GAIN; HOARSENESS; Hyperlipidemia associated with type 2 diabetes mellitus (HCC); GERD (gastroesophageal reflux disease); Essential hypertension; Type 2 diabetes mellitus treated with insulin (HCC); Dyspnea; Diverticulosis; Unstable angina - history of; Hypothyroid; Tracheobronchomalacia; 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); Pedal edema; Scoliosis of lumbar spine; Lumbar spondylosis; Radiculopathy, lumbar region; Subacute cough; Age-related osteoporosis with current pathological fracture, vertebra(e), initial encounter for fracture (HCC); Inflammation of sacroiliac joint (HCC); Polyneuropathy;  Acute pulmonary embolism (HCC); Cholelithiasis; Wheezing; Acute respiratory failure with hypoxia (HCC); Falls; Altered mental status; Acute encephalopathy; Nausea vomiting and diarrhea; and Acute metabolic encephalopathy on their problem list. She  has a past medical history of Abnormal weight gain, Acute diverticulitis (05/13/2021), Arthritis, COPD (chronic obstructive pulmonary disease) (HCC), Coronary artery disease, non-occlusive (06/13/2013), Diverticulosis, GERD (gastroesophageal reflux disease), History of stress test, Hyperlipidemia, Hypertension, NIDDM (non-insulin dependent diabetes mellitus), OSA (obstructive sleep apnea), and Tracheobronchomalacia. She  has a past surgical history that includes Tubal ligation; Cardiac catheterization (06/2013); transthoracic echocardiogram (06/17/2013); and left heart catheterization with coronary angiogram (N/A, 06/16/2013) are also affecting patient's functional outcome.   REHAB POTENTIAL: Good  CLINICAL DECISION MAKING: Evolving/moderate complexity  EVALUATION COMPLEXITY: Moderate   GOALS: Goals reviewed with patient? No  SHORT TERM GOALS: Target date: 11/08/2022  Patient will be independent with initial home exercise program for self-management of symptoms. Baseline: Initial HEP provided at IE (10/25/22); Goal status: In-progress   LONG TERM GOALS: Target date: 01/17/2023  Patient will be independent with a long-term home exercise program for self-management of symptoms.  Baseline: Initial HEP provided at IE (10/25/22); Goal status: In-progress  2.  Patient will demonstrate improved FOTO by equal or greater than 10 points by visit #10 to demonstrate improvement in overall condition and self-reported functional ability.  Baseline: to be measured visit 2 as appropriate (10/25/22); Goal status: In-progress  3.  Patient will improve cervical spine rotation to equal or greater than 60 degrees each direction without increased pain to improve ability  to view her surroundings without difficulty.  Baseline: R/L 67/50 with concordant pain (10/25/22); Goal status: In-progress  4.  Patient will score equal or greater than 26 seconds on the Deep Neck Flexor Endurance Test to demonstrate improved neck strength/endurance and reach age matched norm in healthy women to improve her ability to complete her daily activities with less difficulty due to neck pain associated with fatigue.  Baseline: to be tested visit 2 as appropriate (10/25/22); Goal status: In-progress  5.  Patient will complete community, work and/or recreational activities with 50% less limitation due to current condition.  Baseline: difficulty feeling like "doing much of nothing," watching TV, being with family, crosswords, play solitaire on her phone, turning her head, flexing or extending neck, prolonged sitting, traveling (10/25/22); Goal status: In-progress   PLAN:  PT FREQUENCY: 1-2x/week  PT DURATION: 12 weeks  PLANNED INTERVENTIONS: Therapeutic exercises, Therapeutic activity, Neuromuscular re-education, Patient/Family education, Self Care, DME instructions, Dry Needling, Electrical stimulation, Cryotherapy, Moist heat, Manual therapy, and Re-evaluation  PLAN FOR NEXT SESSION: update HEP as appropriate, progressive postural/UE/functional strengthening, motor control, and ROM exercises as appropriate. Education. Manual therapy as needed.    Cira Rue, PT, DPT 10/26/2022, 10:15 AM  [place]  [facility] [address] [place], [place] [address] P: [phone] I F: [phone]

## 2022-10-27 ENCOUNTER — Other Ambulatory Visit: Payer: Self-pay | Admitting: Family Medicine

## 2022-10-30 ENCOUNTER — Ambulatory Visit: Payer: Medicare Other | Admitting: Physical Therapy

## 2022-10-30 ENCOUNTER — Encounter: Payer: Self-pay | Admitting: Physical Therapy

## 2022-10-30 VITALS — BP 129/47 | HR 55

## 2022-10-30 DIAGNOSIS — M542 Cervicalgia: Secondary | ICD-10-CM | POA: Diagnosis not present

## 2022-10-30 DIAGNOSIS — M792 Neuralgia and neuritis, unspecified: Secondary | ICD-10-CM | POA: Diagnosis not present

## 2022-10-30 DIAGNOSIS — R519 Headache, unspecified: Secondary | ICD-10-CM | POA: Diagnosis not present

## 2022-10-30 NOTE — Therapy (Signed)
OUTPATIENT PHYSICAL THERAPY TREATMENT   Patient Name: April [name] MRN: [id number] DOB:[birth year], 82 y.o., female Today's Date: 10/30/2022  END OF SESSION:  PT End of Session - 10/30/22 1622     Visit Number 2    Number of Visits 13    Date for PT Re-Evaluation 01/17/23    Authorization Type [employer] MEDICARE reporting period from 10/25/2022    Progress Note Due on Visit 10    PT Start Time 1622    PT Stop Time 1646    PT Time Calculation (min) 24 min    Activity Tolerance Patient tolerated treatment well    Behavior During Therapy [facility] for tasks assessed/performed              Past Medical History:  Diagnosis Date   Abnormal weight gain    Acute diverticulitis 05/13/2021   Arthritis    "joints" (06/13/2013)   COPD (chronic obstructive pulmonary disease) (HCC)    Coronary artery disease, non-occlusive 06/13/2013   Cardiac Cath: sharp Angle take-off of Large Dominant Cx: ~70-80% mid Cx bifurcation lesion (Lateral OM &  AVGCx-PL-PDA both with hairpin ostial & ~50% lesions) - Not PCI amenable due to vessel tortuosity; ~40-50% mid LAD;Ramus - no significnat diseaes; small non-dominant RCA   Diverticulosis    GERD (gastroesophageal reflux disease)    History of stress test    a. 09/2006 nl Dobutamine Echo   Hyperlipidemia    Hypertension    NIDDM (non-insulin dependent diabetes mellitus)    On home oxygen therapy    "2L q hs; runs into my BIPAP" (06/13/2013)   OSA (obstructive sleep apnea)    "BIPAP w/O2" (06/13/2013)   Tracheobronchomalacia    a. followed by [facility]   Past Surgical History:  Procedure Laterality Date   CARDIAC CATHETERIZATION  06/2013   Cardiac Cath: sharp Angle take-off of Large Dominant Cx: ~70-80% mid Cx bifurcation lesion (Lateral OM &  AVGCx-PL-PDA both with hairpin ostial & ~50% lesions) - Not PCI amenable due to vessel tortuosity; ~40-50% mid LAD;Ramus - no significnat diseaes; small non-dominant RCA   LEFT HEART CATHETERIZATION WITH CORONARY  ANGIOGRAM N/A 06/16/2013   Procedure: LEFT HEART CATHETERIZATION WITH CORONARY ANGIOGRAM;  Surgeon: Marykay Lex, MD;  Location: [facility] CATH LAB;  Service: Cardiovascular;  Laterality: N/A;   TRANSTHORACIC ECHOCARDIOGRAM  06/17/2013   Normal LV size and function. EF 55-60% with no regional WMA. Grade 1 diastolic dysfunction. No significant valvular lesions   TUBAL LIGATION     Patient Active Problem List   Diagnosis Date Noted   Acute metabolic encephalopathy 07/07/2022   Acute encephalopathy 07/06/2022   Nausea vomiting and diarrhea 07/06/2022   Altered mental status 07/05/2022   Falls 05/01/2022   Acute pulmonary embolism (HCC) 04/18/2022   Cholelithiasis 04/18/2022   Wheezing 04/18/2022   Acute respiratory failure with hypoxia (HCC) 04/18/2022   Age-related osteoporosis with current pathological fracture, vertebra(e), initial encounter for fracture (HCC) 04/04/2022   Inflammation of sacroiliac joint (HCC) 04/04/2022   Polyneuropathy 04/04/2022   Subacute cough 03/28/2022   Pedal edema 11/04/2020   Scoliosis of lumbar spine 08/04/2020   Lumbar spondylosis 08/04/2020   Radiculopathy, lumbar region 05/19/2020   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) 07/01/2013   Hypothyroid 06/16/2013   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   Unstable angina - history of 06/13/2013   Hyperlipidemia associated with type 2 diabetes mellitus (HCC)    GERD (gastroesophageal reflux disease)    Essential hypertension  Type 2 diabetes mellitus treated with insulin (HCC)    Dyspnea    Diverticulosis    HOARSENESS 06/30/2009   OSA treated with BiPAP 01/01/2007   WEIGHT GAIN 01/01/2007    PCP:  Donita Brooks, MD  REFERRING PROVIDER: Ocie Doyne, MD   REFERRING DIAG: cervicalgia  THERAPY DIAG:  Cervicalgia  Nonintractable headache, unspecified chronicity pattern, unspecified headache type  Neuralgia and neuritis  Rationale for Evaluation and  Treatment: Rehabilitation  ONSET DATE: Christmas season 2023.   PERTINENT HISTORY:  Patient is a 82 y.o. female who presents to outpatient physical therapy with a referral for medical diagnosis cervicalgia. This patient's chief complaints consist of left sided neck pain and stiffness and headache that is worst near the mastoid process and intermittently radiates to the top of her head, temple, and behind left eye, and left UE pain leading to the following functional deficits: difficulty feeling like "doing much of nothing," watching TV, being with family, crosswords, play solitaire on her phone, turning her head, flexing or extending neck, prolonged sitting, traveling. 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 She  has a past medical history of Abnormal weight gain, Acute diverticulitis (05/13/2021), Arthritis, COPD (chronic obstructive pulmonary disease) (HCC), Coronary artery disease, non-occlusive (06/13/2013), Diverticulosis, GERD (gastroesophageal reflux disease), History of stress  test, Hyperlipidemia, Hypertension, NIDDM (non-insulin dependent diabetes mellitus), OSA (obstructive sleep apnea), and Tracheobronchomalacia. She  has a past surgical history that includes Tubal ligation; Cardiac catheterization (06/2013); transthoracic echocardiogram (06/17/2013); and left heart catheterization with coronary angiogram (N/A, 06/16/2013).  Patient/DIL denies hx of cancer, stroke, seizures, unexplained weight loss, unexplained changes in bowel or bladder problems, unexplained stumbling or dropping things, and spinal surgery.  SUBJECTIVE:  SUBJECTIVE STATEMENT: Patient reports she was sore all over her neck and shoulders after her first PT session. She states she did her HEP and that bothered her some too. She took some tylenol after doing the exercises, and the pain subsided in about an hour.   PAIN:  NPRS: 6/10 behind left ear  PRECAUTIONS: Fall   PATIENT GOALS: "want to get her head to quit hurting and would like to get my back to quit  hurting"   NEXT MD VISIT: 6 months from the last visit  OBJECTIVE  Vitals:   10/30/22 1624  BP: (!) 129/47  Pulse: (!) 55  SpO2: 96%    MUSCLE PERFORMANCE (MMT):  *Indicates pain 10/25/22 Date Date  Joint/Motion R/L R/L R/L  Shoulder     Flexion / / /  Abduction (C5) / / /  External rotation / / /  Internal rotation / / /  Extension / / /  Elbow     Flexion (C6) / / /  Extension (C7) / / /  Hand     Thumb extension (C8) B 4-/5* / /  Finger abduction (T1) B WFL / /  Comments:  10/25/2022: further testing deferred to prevent unnecessary irritation and due to time limitations.   FUNCTIONAL TESTS - Deep Neck Flexor Endurance Test: deferred due to irritability.    TODAY'S TREATMENT: Therapeutic exercise: to centralize symptoms and  improve ROM, strength, muscular endurance, and activity tolerance required for successful completion of functional activities.  - vitals measurement for baseline/safety screening screening.  - hooklying deep neck flexor nods, 1x10 with 5 second holds plus more unsuccessful reps. Heavy cuing for form. Unable to fully avoid activating SCM.  - seated cervical spine retraction AROM, 1x10 with heavy verbal and tactile cuing.  - seated scapular retraction, 1x5 with good carry over.   Pt required multimodal cuing for proper technique and to facilitate improved neuromuscular control, strength, range of motion, and functional ability resulting in improved performance and form.  Patient required assistance with FOTO questionnaire. 10 minutes.   PATIENT EDUCATION:  Education details: Exercise purpose/form. Self management techniques. Education on diagnosis, prognosis, POC, anatomy and physiology of current condition. Education on HEP including handout. Person educated: Patient and Caregiver DIL [name] Education method: Explanation, Demonstration, Tactile cues, Verbal cues, and Handouts Education comprehension: verbalized understanding, returned demonstration, and needs further education  HOME EXERCISE PROGRAM: Access Code: [email or web address] URL: [email or web address] Date: 10/25/2022 Prepared by: Norton Blizzard  Exercises - Supine Deep Neck Flexor Nods  - 1-2 x daily - 1 sets - 10 reps - 10 seconds hold - Seated Cervical Retraction  - 1-2 x daily - 3 sets - 10 reps - 1 seconds hold - Seated Scapular Retraction  - 1-2 x daily - 3 sets - 10 reps - 1 second hold  ASSESSMENT:  CLINICAL IMPRESSION: Patient arrives with her daughter reporting increased soreness with exercises. Patient had difficulty with carry over for HEP. Time shortened due to patient being 20 minutes late. Plan to continue with strengthening and stretching as tolerated next session. Patient would benefit from continued management  of limiting condition by skilled physical therapist to address remaining impairments and functional limitations to work towards stated goals and return to PLOF or maximal functional independence.   From Initial PT Evaluation 10/25/2022:  Patient is a 82 y.o. female referred to outpatient physical therapy with a medical diagnosis of cervicalgia who presents with signs and symptoms consistent with left sided neck pain with radiation to the  left side of the head reproducing patient's headache complaint. Patient also has limited AROM especially into left rotation that is associated with the neck pain and headache and left arm discomfort possibly radiating from left sided neck. Patient presents with significant posture, joint stiffness, ROM, motor control, muscle tension, muscle performance (strength/power/endurance) and activity tolerance impairments that are limiting ability to complete usual activities such as watching TV, being with family, crosswords, play solitaire on her phone, turning her head, flexing or extending neck, prolonged sitting, traveling without difficulty and making her feel like "doing much of nothing." Patient will benefit from skilled physical therapy intervention to address current body structure impairments and activity limitations to improve function and work towards goals set in current POC in order to return to prior level of function or maximal functional improvement.    OBJECTIVE IMPAIRMENTS: Abnormal gait, decreased activity tolerance, decreased cognition, decreased coordination, decreased endurance, decreased knowledge of condition, decreased mobility, difficulty walking, decreased ROM, decreased strength, hypomobility, impaired perceived functional ability, increased muscle spasms, impaired flexibility, impaired UE functional use, improper body mechanics, postural dysfunction, and pain.   ACTIVITY LIMITATIONS: carrying, lifting, sitting, sleeping, transfers, bed mobility, bathing,  dressing, hygiene/grooming, and locomotion level  PARTICIPATION LIMITATIONS: meal prep, cleaning, interpersonal relationship, shopping, community activity, and   difficulty feeling like "doing much of nothing," watching TV, being with family, crosswords, play solitaire on her phone, turning her head, flexing or extending neck, prolonged sitting, traveling  PERSONAL FACTORS: Age, Fitness, Past/current experiences, Time since onset of injury/illness/exacerbation, and 3+ comorbidities:   OSA treated with BiPAP; WEIGHT GAIN; HOARSENESS; Hyperlipidemia associated with type 2 diabetes mellitus (HCC); GERD (gastroesophageal reflux disease); Essential hypertension; Type 2 diabetes mellitus treated with insulin (HCC); Dyspnea; Diverticulosis; Unstable angina - history of; Hypothyroid; Tracheobronchomalacia; 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 She  has a past medical history of Abnormal weight gain, Acute diverticulitis (05/13/2021), Arthritis, COPD (chronic obstructive pulmonary disease) (HCC), Coronary artery disease, non-occlusive (06/13/2013), Diverticulosis, GERD (gastroesophageal reflux disease), History of stress test, Hyperlipidemia, Hypertension, NIDDM (non-insulin dependent diabetes mellitus), OSA (obstructive sleep apnea), and Tracheobronchomalacia. She  has a past surgical history that includes Tubal ligation; Cardiac catheterization (06/2013);  transthoracic echocardiogram (06/17/2013); and left heart catheterization with coronary angiogram (N/A, 06/16/2013) are also affecting patient's functional outcome.   REHAB POTENTIAL: Good  CLINICAL DECISION MAKING: Evolving/moderate complexity  EVALUATION COMPLEXITY: Moderate   GOALS: Goals reviewed with patient? No  SHORT TERM GOALS: Target date: 11/08/2022  Patient will be independent with initial home exercise program for self-management of symptoms. Baseline: Initial HEP provided at IE (10/25/22); Goal status: In-progress   LONG TERM GOALS: Target date: 01/17/2023  Patient will be independent with a long-term home exercise program for self-management of symptoms.  Baseline: Initial HEP provided at IE (10/25/22); Goal status: In-progress  2.  Patient will demonstrate improved FOTO by equal or greater than 10 points by visit #10 to demonstrate improvement in overall condition and self-reported functional ability.  Baseline: to be measured visit 2 as appropriate (10/25/22); Goal status: In-progress  3.  Patient will improve cervical spine rotation to equal or greater than 60  degrees each direction without increased pain to improve ability to view her surroundings without difficulty.  Baseline: R/L 67/50 with concordant pain (10/25/22); Goal status: In-progress  4.  Patient will score equal or greater than 26 seconds on the Deep Neck Flexor Endurance Test to demonstrate improved neck strength/endurance and reach age matched norm in healthy women to improve her ability to complete her daily activities with less difficulty due to neck pain associated with fatigue.  Baseline: to be tested visit 2 as appropriate (10/25/22); Goal status: In-progress  5.  Patient will complete community, work and/or recreational activities with 50% less limitation due to current condition.  Baseline: difficulty feeling like "doing much of nothing," watching TV, being with family, crosswords, play  solitaire on her phone, turning her head, flexing or extending neck, prolonged sitting, traveling (10/25/22); Goal status: In-progress   PLAN:  PT FREQUENCY: 1-2x/week  PT DURATION: 12 weeks  PLANNED INTERVENTIONS: Therapeutic exercises, Therapeutic activity, Neuromuscular re-education, Patient/Family education, Self Care, DME instructions, Dry Needling, Electrical stimulation, Cryotherapy, Moist heat, Manual therapy, and Re-evaluation  PLAN FOR NEXT SESSION: update HEP as appropriate, progressive postural/UE/functional strengthening, motor control, and ROM exercises as appropriate. Education. Manual therapy as needed.    Cira Rue, PT, DPT 10/30/2022, 5:05 PM  [address] [facility] [address] [place], [place] [address] P: [phone] I F: [phone]

## 2022-10-30 NOTE — Telephone Encounter (Signed)
OV 07/11/22 Requested Prescriptions  Pending Prescriptions Disp Refills   glucose blood (ONETOUCH VERIO) test strip [Pharmacy Med Name: ONETOUCH VERIO TEST STRIPS VERIO STRIP] 100 each 3    Sig: USE AS DIRECTED TO MONITOR BLOOD SUGARS UP TO THREE TIMES DAILY AS NEEDED     Endocrinology: Diabetes - Testing Supplies Failed - 10/27/2022 12:32 PM      Failed - Valid encounter within last 12 months    Recent Outpatient Visits           1 year ago Type 2 diabetes mellitus treated without insulin (HCC)   [facility] Pickard, Priscille Heidelberg, MD   1 year ago Diastolic dysfunction   [facility] Tanya Nones, Priscille Heidelberg, MD   1 year ago Leg swelling   [facility] Donita Brooks, MD   2 years ago Type 2 diabetes mellitus treated without insulin (HCC)   [facility] Donita Brooks, MD   3 years ago Type 2 diabetes mellitus treated without insulin (HCC)   [facility] Pickard, Priscille Heidelberg, MD       Future Appointments             In 2 weeks Herbie Baltimore Piedad Climes, MD [facility] at [facility]   In 1 month Oretha Milch, MD [facility] at [facility], [place]

## 2022-11-01 ENCOUNTER — Other Ambulatory Visit: Payer: Self-pay | Admitting: Family Medicine

## 2022-11-02 ENCOUNTER — Ambulatory Visit: Payer: Medicare Other | Admitting: Physical Therapy

## 2022-11-02 ENCOUNTER — Encounter: Payer: Self-pay | Admitting: Physical Therapy

## 2022-11-02 DIAGNOSIS — M792 Neuralgia and neuritis, unspecified: Secondary | ICD-10-CM | POA: Diagnosis not present

## 2022-11-02 DIAGNOSIS — R519 Headache, unspecified: Secondary | ICD-10-CM

## 2022-11-02 DIAGNOSIS — M542 Cervicalgia: Secondary | ICD-10-CM

## 2022-11-02 NOTE — Telephone Encounter (Signed)
Requested Prescriptions  Pending Prescriptions Disp Refills   metFORMIN (GLUCOPHAGE) 500 MG tablet [Pharmacy Med Name: METFORMIN HYDROCHLORIDE 500MG  TABLET] 180 tablet 1    Sig: TAKE ONE TABLET BY MOUTH TWICE (2) DAILY WITH A MEAL     Endocrinology:  Diabetes - Biguanides Failed - 11/01/2022  9:40 AM      Failed - Cr in normal range and within 360 days    Creat  Date Value Ref Range Status  07/11/2022 0.59 (L) 0.60 - 0.95 mg/dL Final   Creatinine, Urine  Date Value Ref Range Status  04/04/2022 47 20 - 275 mg/dL Final         Failed - HBA1C is between 0 and 7.9 and within 180 days    Hgb A1c MFr Bld  Date Value Ref Range Status  04/04/2022 7.3 (H) <5.7 % of total Hgb Final    Comment:    For someone without known diabetes, a hemoglobin A1c value of 6.5% or greater indicates that they may have  diabetes and this should be confirmed with a follow-up  test. . For someone with known diabetes, a value <7% indicates  that their diabetes is well controlled and a value  greater than or equal to 7% indicates suboptimal  control. A1c targets should be individualized based on  duration of diabetes, age, comorbid conditions, and  other considerations. . Currently, no consensus exists regarding use of hemoglobin A1c for diagnosis of diabetes for children. .          Failed - B12 Level in normal range and within 720 days    Vitamin B-12  Date Value Ref Range Status  07/17/2013 697 211 - 911 pg/mL Final         Failed - Valid encounter within last 6 months    Recent Outpatient Visits           1 year ago Type 2 diabetes mellitus treated without insulin (HCC)   [facility] Pickard, Priscille Heidelberg, MD   1 year ago Diastolic dysfunction   [facility] Pickard, Priscille Heidelberg, MD   1 year ago Leg swelling   [facility] Medicine Donita Brooks, MD   2 years ago Type 2 diabetes mellitus treated without insulin (HCC)   [facility]  Donita Brooks, MD   3 years ago Type 2 diabetes mellitus treated without insulin (HCC)   [facility] Pickard, Priscille Heidelberg, MD       Future Appointments             In 1 week Herbie Baltimore Piedad Climes, MD [facility] at [facility]   In 1 month Oretha Milch, MD [facility] at [facility], [place]            Passed - eGFR in normal range and within 360 days    GFR, Est African American  Date Value Ref Range Status  04/06/2020 99 > OR = 60 mL/min/1.14m2 Final   GFR, Est Non African American  Date Value Ref Range Status  04/06/2020 85 > OR = 60 mL/min/1.68m2 Final   GFR, Estimated  Date Value Ref Range Status  07/08/2022 >60 >60 mL/min Final    Comment:    (NOTE) Calculated using the CKD-EPI Creatinine Equation (2021)    eGFR  Date Value Ref Range Status  07/11/2022 91 > OR = 60 mL/min/1.56m2 Final         Passed - CBC  within normal limits and completed in the last 12 months    WBC  Date Value Ref Range Status  07/11/2022 5.8 3.8 - 10.8 Thousand/uL Final   RBC  Date Value Ref Range Status  07/11/2022 5.02 3.80 - 5.10 Million/uL Final   Hemoglobin  Date Value Ref Range Status  07/11/2022 14.5 11.7 - 15.5 g/dL Final   HCT  Date Value Ref Range Status  07/11/2022 45.3 (H) 35.0 - 45.0 % Final   MCHC  Date Value Ref Range Status  07/11/2022 32.0 32.0 - 36.0 g/dL Final   [facility]  Date Value Ref Range Status  07/11/2022 28.9 27.0 - 33.0 pg Final   MCV  Date Value Ref Range Status  07/11/2022 90.2 80.0 - 100.0 fL Final   No results found for: "PLTCOUNTKUC", "LABPLAT", "POCPLA" RDW  Date Value Ref Range Status  07/11/2022 12.5 11.0 - 15.0 % Final          gabapentin (NEURONTIN) 600 MG tablet [Pharmacy Med Name: GABAPENTIN 600MG  TABLET] 360 tablet 0    Sig: TAKE ONE TABLET BY MOUTH FOUR TIMES DAILY     Neurology: Anticonvulsants - gabapentin Failed - 11/01/2022  9:40 AM      Failed - Cr in normal range and within 360  days    Creat  Date Value Ref Range Status  07/11/2022 0.59 (L) 0.60 - 0.95 mg/dL Final   Creatinine, Urine  Date Value Ref Range Status  04/04/2022 47 20 - 275 mg/dL Final         Failed - Valid encounter within last 12 months    Recent Outpatient Visits           1 year ago Type 2 diabetes mellitus treated without insulin (HCC)   [facility] Pickard, Priscille Heidelberg, MD   1 year ago Diastolic dysfunction   [facility] Tanya Nones, Priscille Heidelberg, MD   1 year ago Leg swelling   [facility] Donita Brooks, MD   2 years ago Type 2 diabetes mellitus treated without insulin (HCC)   [facility] Donita Brooks, MD   3 years ago Type 2 diabetes mellitus treated without insulin (HCC)   [facility] Pickard, Priscille Heidelberg, MD       Future Appointments             In 1 week Herbie Baltimore Piedad Climes, MD [facility] at [facility]   In 1 month Oretha Milch, MD [facility] at [facility], [place]            Passed - Completed PHQ-2 or PHQ-9 in the last 360 days       escitalopram (LEXAPRO) 10 MG tablet [Pharmacy Med Name: ESCITALOPRAM OXALATE 10MG  TABLET] 30 tablet 3    Sig: TAKE ONE TABLET BY MOUTH ONCE A DAY     Psychiatry:  Antidepressants - SSRI Failed - 11/01/2022  9:40 AM      Failed - Valid encounter within last 6 months    Recent Outpatient Visits           1 year ago Type 2 diabetes mellitus treated without insulin (HCC)   [facility] Pickard, Priscille Heidelberg, MD   1 year ago Diastolic dysfunction   [facility] Pickard, Priscille Heidelberg, MD   1 year ago Leg swelling   [facility] Donita Brooks, MD   2 years ago Type 2  diabetes mellitus treated without insulin (HCC)   [facility] Pickard, Priscille Heidelberg, MD   3 years ago Type 2 diabetes mellitus treated without insulin (HCC)   [facility] Pickard, Priscille Heidelberg,  MD       Future Appointments             In 1 week Herbie Baltimore Piedad Climes, MD [facility] at [facility]   In 1 month Oretha Milch, MD [facility] at [facility], [place]

## 2022-11-02 NOTE — Therapy (Signed)
OUTPATIENT PHYSICAL THERAPY TREATMENT   Patient Name: April [name] MRN: [id number] DOB:1940-11-27, 82 y.o., female Today's Date: 11/02/2022  END OF SESSION:  PT End of Session - 11/02/22 0906     Visit Number 3    Number of Visits 13    Date for PT Re-Evaluation 01/17/23    Authorization Type [employer] MEDICARE reporting period from 10/25/2022    Progress Note Due on Visit 10    PT Start Time 0903    PT Stop Time 0941    PT Time Calculation (min) 38 min    Activity Tolerance Patient limited by pain    Behavior During Therapy [facility] for tasks assessed/performed               Past Medical History:  Diagnosis Date   Abnormal weight gain    Acute diverticulitis 05/13/2021   Arthritis    "joints" (06/13/2013)   COPD (chronic obstructive pulmonary disease) (HCC)    Coronary artery disease, non-occlusive 06/13/2013   Cardiac Cath: sharp Angle take-off of Large Dominant Cx: ~70-80% mid Cx bifurcation lesion (Lateral OM &  AVGCx-PL-PDA both with hairpin ostial & ~50% lesions) - Not PCI amenable due to vessel tortuosity; ~40-50% mid LAD;Ramus - no significnat diseaes; small non-dominant RCA   Diverticulosis    GERD (gastroesophageal reflux disease)    History of stress test    a. 09/2006 nl Dobutamine Echo   Hyperlipidemia    Hypertension    NIDDM (non-insulin dependent diabetes mellitus)    On home oxygen therapy    "2L q hs; runs into my BIPAP" (06/13/2013)   OSA (obstructive sleep apnea)    "BIPAP w/O2" (06/13/2013)   Tracheobronchomalacia    a. followed by [facility]   Past Surgical History:  Procedure Laterality Date   CARDIAC CATHETERIZATION  06/2013   Cardiac Cath: sharp Angle take-off of Large Dominant Cx: ~70-80% mid Cx bifurcation lesion (Lateral OM &  AVGCx-PL-PDA both with hairpin ostial & ~50% lesions) - Not PCI amenable due to vessel tortuosity; ~40-50% mid LAD;Ramus - no significnat diseaes; small non-dominant RCA   LEFT HEART CATHETERIZATION WITH CORONARY ANGIOGRAM  N/A 06/16/2013   Procedure: LEFT HEART CATHETERIZATION WITH CORONARY ANGIOGRAM;  Surgeon: Marykay Lex, MD;  [facility] CATH LAB;  Service: Cardiovascular;  Laterality: N/A;   TRANSTHORACIC ECHOCARDIOGRAM  06/17/2013   Normal LV size and function. EF 55-60% with no regional WMA. Grade 1 diastolic dysfunction. No significant valvular lesions   TUBAL LIGATION     Patient Active Problem List   Diagnosis Date Noted   Acute metabolic encephalopathy 07/07/2022   Acute encephalopathy 07/06/2022   Nausea vomiting and diarrhea 07/06/2022   Altered mental status 07/05/2022   Falls 05/01/2022   Acute pulmonary embolism (HCC) 04/18/2022   Cholelithiasis 04/18/2022   Wheezing 04/18/2022   Acute respiratory failure with hypoxia (HCC) 04/18/2022   Age-related osteoporosis with current pathological fracture, vertebra(e), initial encounter for fracture (HCC) 04/04/2022   Inflammation of sacroiliac joint (HCC) 04/04/2022   Polyneuropathy 04/04/2022   Subacute cough 03/28/2022   Pedal edema 11/04/2020   Scoliosis of lumbar spine 08/04/2020   Lumbar spondylosis 08/04/2020   Radiculopathy, lumbar region 05/19/2020   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) 07/01/2013   Hypothyroid 06/16/2013   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   Unstable angina - history of 06/13/2013   Hyperlipidemia associated with type 2 diabetes mellitus (HCC)    GERD (gastroesophageal reflux disease)    Essential hypertension  Type 2 diabetes mellitus treated with insulin (HCC)    Dyspnea    Diverticulosis    HOARSENESS 06/30/2009   OSA treated with BiPAP 01/01/2007   WEIGHT GAIN 01/01/2007    PCP:  Donita Brooks, MD  REFERRING PROVIDER: Ocie Doyne, MD   REFERRING DIAG: cervicalgia  THERAPY DIAG:  Cervicalgia  Nonintractable headache, unspecified chronicity pattern, unspecified headache type  Neuralgia and neuritis  Rationale for Evaluation and Treatment:  Rehabilitation  ONSET DATE: Christmas season 2023.   PERTINENT HISTORY:  Patient is a 82 y.o. female who presents to outpatient physical therapy with a referral for medical diagnosis cervicalgia. This patient's chief complaints consist of left sided neck pain and stiffness and headache that is worst near the mastoid process and intermittently radiates to the top of her head, temple, and behind left eye, and left UE pain leading to the following functional deficits: difficulty feeling like "doing much of nothing," watching TV, being with family, crosswords, play solitaire on her phone, turning her head, flexing or extending neck, prolonged sitting, traveling. 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 She  has a past medical history of Abnormal weight gain, Acute diverticulitis (05/13/2021), Arthritis, COPD (chronic obstructive pulmonary disease) (HCC), Coronary artery disease, non-occlusive (06/13/2013), Diverticulosis, GERD (gastroesophageal reflux disease), History of stress test,  Hyperlipidemia, Hypertension, NIDDM (non-insulin dependent diabetes mellitus), OSA (obstructive sleep apnea), and Tracheobronchomalacia. She  has a past surgical history that includes Tubal ligation; Cardiac catheterization (06/2013); transthoracic echocardiogram (06/17/2013); and left heart catheterization with coronary angiogram (N/A, 06/16/2013).  Patient/DIL denies hx of cancer, stroke, seizures, unexplained weight loss, unexplained changes in bowel or bladder problems, unexplained stumbling or dropping things, and spinal surgery.  SUBJECTIVE:  SUBJECTIVE STATEMENT: Patient reports she had increased pain after last PT session and the exercises seem to make her pain worse.   PAIN:  NPRS: 6/10 left sided headache and neck pain, worst at left ear  PRECAUTIONS: Fall   PATIENT GOALS: "want to get her head to quit hurting and would like to get my back to quit  hurting"   NEXT MD VISIT: 6 months from the last visit  OBJECTIVE  TODAY'S TREATMENT: Therapeutic exercise: to centralize symptoms and improve ROM, strength, muscular endurance, and activity tolerance required for successful completion of functional activities.  - Upper body ergometer level 1-2 to encourage joint nutrition, warm tissue, induce analgesic effect of aerobic exercise, improve muscular strength and endurance,  and prepare for remainder of session. 5 min total changing directions every 1 minute. Patient reports arm fatigue.   Superset: - seated rows with scapular retraction, 3x10 with RTB - seated suboccipital stretch (cervical retraction + upper cervical flexion with gentle overpressure at top of head and chin), 3x1 min  - seated rotation SNAG with pillow case, 1x10 each direction with 5 second hold.  - Sidelying open book  (thoracic rotation) to improve thoracic, shoulder girdle, and upper trunk mobility. 1x10 each side.   Pt required multimodal cuing for proper technique and to facilitate improved neuromuscular control, strength, range of motion, and functional ability resulting in improved performance and form.  PATIENT EDUCATION:  Education details: Exercise purpose/form. Self management techniques. Education on diagnosis, prognosis, POC, anatomy and physiology of current condition. Education on HEP including handout. Person educated: Patient and Caregiver DIL [name] Education method: Explanation, Demonstration, Tactile cues, Verbal cues, and Handouts Education comprehension: verbalized understanding, returned demonstration, and needs further education  HOME EXERCISE PROGRAM: Access Code: [email or web address] URL: [email or web address] Date: 11/02/2022 Prepared by: Norton Blizzard  Exercises - Supine Deep Neck Flexor Nods  - 1-2 x daily - 1 sets - 10 reps - 10 seconds hold - Sidelying Thoracic Rotation with Open Book  - 1 x daily - 1-2 sets - 10 reps - 5 seconds hold - Seated Cervical Retraction  - 1-2 x daily - 3 sets - 10 reps - 1 seconds hold - Seated Shoulder Row with Anchored Resistance  - 1 x daily - 3 sets - 15 reps  HOME EXERCISE PROGRAM [HAMDPVB] View at [email or web address]" using code: HAMDPVB Suboccipital Stretch -  Repeat 3 Repetitions, Hold 1 Minute, Complete 1 Set, Perform 1 Times a Day  ASSESSMENT:  CLINICAL IMPRESSION: Patient arrives with her daughter reporting continued increased pain with exercises. Today's session focused on gentle progressions in postural strengthening and cervical spine and thoracic spine mobility. Patient found exercises uncomfortable but was able to perform them all with guidance from PT. HEP updated to reflect progressions in session. Patient would benefit from continued management of limiting condition by skilled physical therapist to address remaining  impairments and functional limitations to work towards stated goals and return to PLOF or maximal functional independence.   From Initial PT Evaluation 10/25/2022:  Patient is a 82 y.o. female referred to outpatient physical therapy with a medical diagnosis of cervicalgia who presents with signs and symptoms consistent with left sided neck pain with radiation to the left side of the head reproducing patient's headache complaint. Patient also has limited AROM especially into left rotation that is associated with the neck pain and headache and left arm discomfort possibly radiating from left sided neck. Patient presents with significant posture, joint stiffness, ROM, motor control, muscle tension, muscle performance (strength/power/endurance)  and activity tolerance impairments that are limiting ability to complete usual activities such as watching TV, being with family, crosswords, play solitaire on her phone, turning her head, flexing or extending neck, prolonged sitting, traveling without difficulty and making her feel like "doing much of nothing." Patient will benefit from skilled physical therapy intervention to address current body structure impairments and activity limitations to improve function and work towards goals set in current POC in order to return to prior level of function or maximal functional improvement.    OBJECTIVE IMPAIRMENTS: Abnormal gait, decreased activity tolerance, decreased cognition, decreased coordination, decreased endurance, decreased knowledge of condition, decreased mobility, difficulty walking, decreased ROM, decreased strength, hypomobility, impaired perceived functional ability, increased muscle spasms, impaired flexibility, impaired UE functional use, improper body mechanics, postural dysfunction, and pain.   ACTIVITY LIMITATIONS: carrying, lifting, sitting, sleeping, transfers, bed mobility, bathing, dressing, hygiene/grooming, and locomotion level  PARTICIPATION  LIMITATIONS: meal prep, cleaning, interpersonal relationship, shopping, community activity, and   difficulty feeling like "doing much of nothing," watching TV, being with family, crosswords, play solitaire on her phone, turning her head, flexing or extending neck, prolonged sitting, traveling  PERSONAL FACTORS: Age, Fitness, Past/current experiences, Time since onset of injury/illness/exacerbation, and 3+ comorbidities:   OSA treated with BiPAP; WEIGHT GAIN; HOARSENESS; Hyperlipidemia associated with type 2 diabetes mellitus (HCC); GERD (gastroesophageal reflux disease); Essential hypertension; Type 2 diabetes mellitus treated with insulin (HCC); Dyspnea; Diverticulosis; Unstable angina - history of; Hypothyroid; Tracheobronchomalacia; 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 She  has a past medical history of Abnormal weight gain, Acute diverticulitis (05/13/2021), Arthritis, COPD (chronic obstructive pulmonary disease) (HCC), Coronary artery disease, non-occlusive (06/13/2013), Diverticulosis, GERD (gastroesophageal reflux disease), History of stress test, Hyperlipidemia, Hypertension, NIDDM (non-insulin dependent diabetes mellitus), OSA (obstructive sleep apnea), and Tracheobronchomalacia. She  has a past surgical history that includes Tubal ligation; Cardiac catheterization (06/2013); transthoracic echocardiogram (06/17/2013); and left heart catheterization  with coronary angiogram (N/A, 06/16/2013) are also affecting patient's functional outcome.   REHAB POTENTIAL: Good  CLINICAL DECISION MAKING: Evolving/moderate complexity  EVALUATION COMPLEXITY: Moderate   GOALS: Goals reviewed with patient? No  SHORT TERM GOALS: Target date: 11/08/2022  Patient will be independent with initial home exercise program for self-management of symptoms. Baseline: Initial HEP provided at IE (10/25/22); Goal status: In-progress   LONG TERM GOALS: Target date: 01/17/2023  Patient will be independent with a long-term home exercise program for self-management of symptoms.  Baseline: Initial HEP provided at IE (10/25/22); Goal status: In-progress  2.  Patient will demonstrate improved FOTO by equal or greater than 10 points by visit #10 to demonstrate improvement in overall condition and self-reported functional ability.  Baseline: to be measured visit 2 as appropriate (10/25/22); Goal status: In-progress  3.  Patient will improve cervical spine rotation to equal or greater than 60 degrees each direction without increased pain to improve ability to view her surroundings without difficulty.  Baseline: R/L 67/50 with concordant pain (10/25/22); Goal status: In-progress  4.  Patient will score equal or greater than 26 seconds on the Deep Neck Flexor Endurance Test to demonstrate improved neck strength/endurance and reach  age matched norm in healthy women to improve her ability to complete her daily activities with less difficulty due to neck pain associated with fatigue.  Baseline: to be tested visit 2 as appropriate (10/25/22); Goal status: In-progress  5.  Patient will complete community, work and/or recreational activities with 50% less limitation due to current condition.  Baseline: difficulty feeling like "doing much of nothing," watching TV, being with family, crosswords, play solitaire on her phone, turning her head, flexing or extending neck, prolonged  sitting, traveling (10/25/22); Goal status: In-progress   PLAN:  PT FREQUENCY: 1-2x/week  PT DURATION: 12 weeks  PLANNED INTERVENTIONS: Therapeutic exercises, Therapeutic activity, Neuromuscular re-education, Patient/Family education, Self Care, DME instructions, Dry Needling, Electrical stimulation, Cryotherapy, Moist heat, Manual therapy, and Re-evaluation  PLAN FOR NEXT SESSION: update HEP as appropriate, progressive postural/UE/functional strengthening, motor control, and ROM exercises as appropriate. Education. Manual therapy as needed.    Cira Rue, PT, DPT 11/02/2022, 9:42 AM  [facility] [address] [place], [place] [address] P: [phone] I F: [phone]

## 2022-11-06 ENCOUNTER — Ambulatory Visit: Payer: Medicare Other | Admitting: Physical Therapy

## 2022-11-06 ENCOUNTER — Encounter: Payer: Self-pay | Admitting: Physical Therapy

## 2022-11-06 DIAGNOSIS — M792 Neuralgia and neuritis, unspecified: Secondary | ICD-10-CM

## 2022-11-06 DIAGNOSIS — M542 Cervicalgia: Secondary | ICD-10-CM

## 2022-11-06 DIAGNOSIS — R519 Headache, unspecified: Secondary | ICD-10-CM

## 2022-11-06 NOTE — Therapy (Signed)
OUTPATIENT PHYSICAL THERAPY TREATMENT   Patient Name: April [name] MRN: [id number] DOB:[birth year], 82 y.o., female Today's Date: 11/06/2022  END OF SESSION:  PT End of Session - 11/06/22 1038     Visit Number 4    Number of Visits 13    Date for PT Re-Evaluation 01/17/23    Authorization Type [employer] MEDICARE reporting period from 10/25/2022    Progress Note Due on Visit 10    PT Start Time 0903    PT Stop Time 0941    PT Time Calculation (min) 38 min    Activity Tolerance Patient limited by pain    Behavior During Therapy [facility] for tasks assessed/performed                Past Medical History:  Diagnosis Date   Abnormal weight gain    Acute diverticulitis 05/13/2021   Arthritis    "joints" (06/13/2013)   COPD (chronic obstructive pulmonary disease) (HCC)    Coronary artery disease, non-occlusive 06/13/2013   Cardiac Cath: sharp Angle take-off of Large Dominant Cx: ~70-80% mid Cx bifurcation lesion (Lateral OM &  AVGCx-PL-PDA both with hairpin ostial & ~50% lesions) - Not PCI amenable due to vessel tortuosity; ~40-50% mid LAD;Ramus - no significnat diseaes; small non-dominant RCA   Diverticulosis    GERD (gastroesophageal reflux disease)    History of stress test    a. 09/2006 nl Dobutamine Echo   Hyperlipidemia    Hypertension    NIDDM (non-insulin dependent diabetes mellitus)    On home oxygen therapy    "2L q hs; runs into my BIPAP" (06/13/2013)   OSA (obstructive sleep apnea)    "BIPAP w/O2" (06/13/2013)   Tracheobronchomalacia    a. followed by [facility]   Past Surgical History:  Procedure Laterality Date   CARDIAC CATHETERIZATION  06/2013   Cardiac Cath: sharp Angle take-off of Large Dominant Cx: ~70-80% mid Cx bifurcation lesion (Lateral OM &  AVGCx-PL-PDA both with hairpin ostial & ~50% lesions) - Not PCI amenable due to vessel tortuosity; ~40-50% mid LAD;Ramus - no significnat diseaes; small non-dominant RCA   LEFT HEART CATHETERIZATION WITH CORONARY ANGIOGRAM  N/A 06/16/2013   Procedure: LEFT HEART CATHETERIZATION WITH CORONARY ANGIOGRAM;  Surgeon: Marykay Lex, MD;  Location: [facility] CATH LAB;  Service: Cardiovascular;  Laterality: N/A;   TRANSTHORACIC ECHOCARDIOGRAM  06/17/2013   Normal LV size and function. EF 55-60% with no regional WMA. Grade 1 diastolic dysfunction. No significant valvular lesions   TUBAL LIGATION     Patient Active Problem List   Diagnosis Date Noted   Acute metabolic encephalopathy 07/07/2022   Acute encephalopathy 07/06/2022   Nausea vomiting and diarrhea 07/06/2022   Altered mental status 07/05/2022   Falls 05/01/2022   Acute pulmonary embolism (HCC) 04/18/2022   Cholelithiasis 04/18/2022   Wheezing 04/18/2022   Acute respiratory failure with hypoxia (HCC) 04/18/2022   Age-related osteoporosis with current pathological fracture, vertebra(e), initial encounter for fracture (HCC) 04/04/2022   Inflammation of sacroiliac joint (HCC) 04/04/2022   Polyneuropathy 04/04/2022   Subacute cough 03/28/2022   Pedal edema 11/04/2020   Scoliosis of lumbar spine 08/04/2020   Lumbar spondylosis 08/04/2020   Radiculopathy, lumbar region 05/19/2020   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) 07/01/2013   Hypothyroid 06/16/2013   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   Unstable angina - history of 06/13/2013   Hyperlipidemia associated with type 2 diabetes mellitus (HCC)    GERD (gastroesophageal reflux disease)    Essential hypertension  Type 2 diabetes mellitus treated with insulin (HCC)    Dyspnea    Diverticulosis    HOARSENESS 06/30/2009   OSA treated with BiPAP 01/01/2007   WEIGHT GAIN 01/01/2007    PCP:  Donita Brooks, MD  REFERRING PROVIDER: Ocie Doyne, MD   REFERRING DIAG: cervicalgia  THERAPY DIAG:  Cervicalgia  Nonintractable headache, unspecified chronicity pattern, unspecified headache type  Neuralgia and neuritis  Rationale for Evaluation and Treatment:  Rehabilitation  ONSET DATE: Christmas season 2023.   PERTINENT HISTORY:  Patient is a 82 y.o. female who presents to outpatient physical therapy with a referral for medical diagnosis cervicalgia. This patient's chief complaints consist of left sided neck pain and stiffness and headache that is worst near the mastoid process and intermittently radiates to the top of her head, temple, and behind left eye, and left UE pain leading to the following functional deficits: difficulty feeling like "doing much of nothing," watching TV, being with family, crosswords, play solitaire on her phone, turning her head, flexing or extending neck, prolonged sitting, traveling. 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 She  has a past medical history of Abnormal weight gain, Acute diverticulitis (05/13/2021), Arthritis, COPD (chronic obstructive pulmonary disease) (HCC), Coronary artery disease, non-occlusive (06/13/2013), Diverticulosis, GERD (gastroesophageal reflux disease), History of stress test,  Hyperlipidemia, Hypertension, NIDDM (non-insulin dependent diabetes mellitus), OSA (obstructive sleep apnea), and Tracheobronchomalacia. She  has a past surgical history that includes Tubal ligation; Cardiac catheterization (06/2013); transthoracic echocardiogram (06/17/2013); and left heart catheterization with coronary angiogram (N/A, 06/16/2013).  Patient/DIL denies hx of cancer, stroke, seizures, unexplained weight loss, unexplained changes in bowel or bladder problems, unexplained stumbling or dropping things, and spinal surgery.  SUBJECTIVE:  SUBJECTIVE STATEMENT: Patient she felt pretty good after last PT session. She states she is not feeling well today and has some allergies. She states she did not do her HEP since last PT session because she did not feel like it. Patient later states she did the older HEP over the weekends, not the new ones.   PAIN:  NPRS: 5/10 left sided headache and neck pain, worst at left ear  PRECAUTIONS: Fall   PATIENT GOALS: "want to get her head to quit hurting and would like to get my back to quit  hurting"   NEXT MD VISIT: 6 months from the last visit  OBJECTIVE  TODAY'S TREATMENT: Therapeutic exercise: to centralize symptoms and improve ROM, strength, muscular endurance, and activity tolerance required for successful completion of functional activities.  - education on the importance of performing HEP including problem solving and discussion about barriers and importance of participating in HEP. Patient states the exercises are not hard, she does not have any part of the day that she feels better, and no part of the day that it is easier to exercise. She states she feels good enough to come to PT visit and was encouraged that if she feels good enough to come to PT,  she feels good enough to do her HEP.  - Upper body ergometer level 1 to encourage joint nutrition, warm tissue, induce analgesic effect of aerobic exercise, improve muscular strength and endurance,  and prepare for remainder of session. 5 min total changing directions every 1 minute. Patient reports arm fatigue.  (Manual therapy/dry needling - see below) - prone cervical spine retraction, 3x10 - prone scapular retractoin, 3x10 - seated rotation SNAG with pillow case, 1x10 each direction with 1 second hold.   Manual therapy: to reduce pain and tissue tension, improve range of motion, neuromodulation, in order to promote improved ability to complete functional activities. PRONE - STM to B suboccipital and cervical paraspinal muscles.   Modality: Dry needling performed to posterior cervical spine to decrease pain and spasms along patient's neck and region with patient in prone utilizing 1 dry needle(s) .25mm x 30mm with 1 stick each side at suboccipital muscles and mid cervical multifidi. Patient educated about the risks and benefits from therapy and verbally consents to treatment.  Dry needling performed by Luretha Murphy. Ilsa Iha PT, DPT who is certified in this technique   Pt required multimodal cuing for proper technique and to facilitate improved neuromuscular control, strength, range of motion, and functional ability resulting in improved performance and form.  PATIENT EDUCATION:  Education details: Exercise purpose/form. Self management techniques. Education on diagnosis, prognosis, POC, anatomy and physiology of current condition. Education on HEP including handout. Person educated: Patient and Caregiver DIL [name] Education method: Explanation, Demonstration, Tactile cues, Verbal cues, and Handouts Education comprehension: verbalized understanding, returned demonstration, and needs further education  HOME EXERCISE PROGRAM: Access Code: [email or web address] URL: [email or web address] Date:  11/02/2022 Prepared by: Norton Blizzard  Exercises - Supine Deep Neck Flexor Nods  - 1-2 x daily - 1 sets - 10 reps - 10 seconds hold - Sidelying Thoracic Rotation with Open Book  - 1 x daily - 1-2 sets - 10 reps - 5 seconds hold - Seated Cervical Retraction  - 1-2 x daily - 3 sets - 10 reps - 1 seconds hold - Seated Shoulder Row with Anchored Resistance  - 1 x daily - 3 sets - 15 reps  HOME EXERCISE PROGRAM [[email or web address]] View at [email or web address]" using code: [email or web address] Suboccipital Stretch -  Repeat 3 Repetitions, Hold 1 Minute, Complete 1 Set, Perform 1 Times a Day  ASSESSMENT:  CLINICAL IMPRESSION: Patient arrives with her daughter reporting incomplete participation in HEP due to having allergies that make her feel unwell. Today's session focused on education about importance of HEP, and continued gentle postural strengthening and cervical spine and thoracic spine mobility. Patient underwent dry needling to the mid cevical multifidi and suboccipital muscles with no immediate change in pain or ROM per observation/report. Plan to re-assess need for further dry needling at next PT session. Patient would benefit from continued management of limiting condition by skilled physical therapist to address remaining impairments and functional limitations to work towards stated goals and return to PLOF or maximal functional independence.   From Initial PT Evaluation 10/25/2022:  Patient is a 82 y.o. female referred to outpatient physical therapy with a medical diagnosis of cervicalgia who presents with signs and symptoms consistent with left sided neck pain with radiation to the left side of the head reproducing patient's headache complaint. Patient also has limited AROM especially into left rotation that is associated with the neck pain and headache and left arm discomfort possibly radiating from left sided neck. Patient presents with significant posture, joint stiffness, ROM, motor control, muscle tension,  muscle performance (strength/power/endurance) and activity tolerance impairments that are limiting ability to complete usual activities such as watching TV, being with family, crosswords, play solitaire on her phone, turning her head, flexing or extending neck, prolonged sitting, traveling without difficulty and making her feel like "doing much of nothing." Patient will benefit from skilled physical therapy intervention to address current body structure impairments and activity limitations to improve function and work towards goals set in current POC in order to return to prior level of function or maximal functional improvement.    OBJECTIVE IMPAIRMENTS: Abnormal gait, decreased activity tolerance, decreased cognition, decreased coordination, decreased endurance, decreased knowledge of condition, decreased mobility, difficulty walking, decreased ROM, decreased strength, hypomobility, impaired perceived functional ability, increased muscle spasms, impaired flexibility, impaired UE functional use, improper body mechanics, postural dysfunction, and pain.   ACTIVITY LIMITATIONS: carrying, lifting, sitting, sleeping, transfers, bed mobility, bathing, dressing, hygiene/grooming, and locomotion level  PARTICIPATION LIMITATIONS: meal prep, cleaning, interpersonal relationship, shopping, community activity, and   difficulty feeling like "doing much of nothing," watching TV, being with family, crosswords, play solitaire on her phone, turning her head, flexing or extending neck, prolonged sitting, traveling  PERSONAL FACTORS: Age, Fitness, Past/current experiences, Time since onset of injury/illness/exacerbation, and 3+ comorbidities:   OSA treated with BiPAP; WEIGHT GAIN; HOARSENESS; Hyperlipidemia associated with type 2 diabetes mellitus (HCC); GERD (gastroesophageal reflux disease); Essential hypertension; Type 2 diabetes mellitus treated with insulin (HCC); Dyspnea; Diverticulosis; Unstable angina - history of;  Hypothyroid; Tracheobronchomalacia; 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 She  has a past medical history of Abnormal weight gain, Acute diverticulitis (05/13/2021), Arthritis, COPD (chronic obstructive pulmonary disease) (HCC), Coronary artery disease, non-occlusive (06/13/2013), Diverticulosis, GERD (gastroesophageal reflux disease), History of stress test, Hyperlipidemia, Hypertension, NIDDM (non-insulin dependent diabetes mellitus), OSA (obstructive sleep apnea), and Tracheobronchomalacia. She  has a past surgical history that includes  Tubal ligation; Cardiac catheterization (06/2013); transthoracic echocardiogram (06/17/2013); and left heart catheterization with coronary angiogram (N/A, 06/16/2013) are also affecting patient's functional outcome.   REHAB POTENTIAL: Good  CLINICAL DECISION MAKING: Evolving/moderate complexity  EVALUATION COMPLEXITY: Moderate   GOALS: Goals reviewed with patient? No  SHORT TERM GOALS: Target date: 11/08/2022  Patient will be independent with initial home exercise program for self-management of symptoms. Baseline: Initial HEP provided at IE (10/25/22); Goal status: In-progress   LONG TERM GOALS: Target date: 01/17/2023  Patient will be independent with a long-term home exercise program for self-management of symptoms.  Baseline: Initial HEP provided at IE (10/25/22); Goal status: In-progress  2.  Patient  will demonstrate improved FOTO by equal or greater than 10 points by visit #10 to demonstrate improvement in overall condition and self-reported functional ability.  Baseline: to be measured visit 2 as appropriate (10/25/22); Goal status: In-progress  3.  Patient will improve cervical spine rotation to equal or greater than 60 degrees each direction without increased pain to improve ability to view her surroundings without difficulty.  Baseline: R/L 67/50 with concordant pain (10/25/22); Goal status: In-progress  4.  Patient will score equal or greater than 26 seconds on the Deep Neck Flexor Endurance Test to demonstrate improved neck strength/endurance and reach age matched norm in healthy women to improve her ability to complete her daily activities with less difficulty due to neck pain associated with fatigue.  Baseline: to be tested visit 2 as appropriate (10/25/22); Goal status: In-progress  5.  Patient will complete community, work and/or recreational activities with 50% less limitation due to current condition.  Baseline: difficulty feeling like "doing much of nothing," watching TV, being with family, crosswords, play solitaire on her phone, turning her head, flexing or extending neck, prolonged sitting, traveling (10/25/22); Goal status: In-progress   PLAN:  PT FREQUENCY: 1-2x/week  PT DURATION: 12 weeks  PLANNED INTERVENTIONS: Therapeutic exercises, Therapeutic activity, Neuromuscular re-education, Patient/Family education, Self Care, DME instructions, Dry Needling, Electrical stimulation, Cryotherapy, Moist heat, Manual therapy, and Re-evaluation  PLAN FOR NEXT SESSION: update HEP as appropriate, progressive postural/UE/functional strengthening, motor control, and ROM exercises as appropriate. Education. Manual therapy as needed.    Cira Rue, PT, DPT 11/06/2022, 11:19 AM  [facility] [address] [place], [place] [address] P:  [phone] I F: [phone]

## 2022-11-07 ENCOUNTER — Ambulatory Visit: Payer: Medicare Other

## 2022-11-07 ENCOUNTER — Ambulatory Visit
Admission: RE | Admit: 2022-11-07 | Discharge: 2022-11-07 | Disposition: A | Discharge status: Home / Self Care | Payer: Medicare Other | Source: Ambulatory Visit | Attending: Family Medicine | Admitting: Family Medicine

## 2022-11-07 DIAGNOSIS — Z1231 Encounter for screening mammogram for malignant neoplasm of breast: Secondary | ICD-10-CM

## 2022-11-08 ENCOUNTER — Encounter: Payer: Self-pay | Admitting: Physical Therapy

## 2022-11-08 ENCOUNTER — Ambulatory Visit: Payer: Medicare Other | Admitting: Physical Therapy

## 2022-11-08 DIAGNOSIS — M542 Cervicalgia: Secondary | ICD-10-CM

## 2022-11-08 DIAGNOSIS — M792 Neuralgia and neuritis, unspecified: Secondary | ICD-10-CM | POA: Diagnosis not present

## 2022-11-08 DIAGNOSIS — R519 Headache, unspecified: Secondary | ICD-10-CM | POA: Diagnosis not present

## 2022-11-08 NOTE — Therapy (Signed)
OUTPATIENT PHYSICAL THERAPY TREATMENT   Patient Name: WENDI RIZZARDI MRN: [id number] DOB:[birth year], 82 y.o., female Today's Date: 11/08/2022  END OF SESSION:  PT End of Session - 11/08/22 1532     Visit Number 5    Number of Visits 13    Date for PT Re-Evaluation 01/17/23    Authorization Type [employer] MEDICARE reporting period from 10/25/2022    Progress Note Due on Visit 10    PT Start Time 1435    PT Stop Time 1520    PT Time Calculation (min) 45 min    Activity Tolerance Patient tolerated treatment well    Behavior During Therapy [facility] for tasks assessed/performed                 Past Medical History:  Diagnosis Date   Abnormal weight gain    Acute diverticulitis 05/13/2021   Arthritis    "joints" (06/13/2013)   COPD (chronic obstructive pulmonary disease) (HCC)    Coronary artery disease, non-occlusive 06/13/2013   Cardiac Cath: sharp Angle take-off of Large Dominant Cx: ~70-80% mid Cx bifurcation lesion (Lateral OM &  AVGCx-PL-PDA both with hairpin ostial & ~50% lesions) - Not PCI amenable due to vessel tortuosity; ~40-50% mid LAD;Ramus - no significnat diseaes; small non-dominant RCA   Diverticulosis    GERD (gastroesophageal reflux disease)    History of stress test    a. 09/2006 nl Dobutamine Echo   Hyperlipidemia    Hypertension    NIDDM (non-insulin dependent diabetes mellitus)    On home oxygen therapy    "2L q hs; runs into my BIPAP" (06/13/2013)   OSA (obstructive sleep apnea)    "BIPAP w/O2" (06/13/2013)   Tracheobronchomalacia    a. followed by [facility]   Past Surgical History:  Procedure Laterality Date   CARDIAC CATHETERIZATION  06/2013   Cardiac Cath: sharp Angle take-off of Large Dominant Cx: ~70-80% mid Cx bifurcation lesion (Lateral OM &  AVGCx-PL-PDA both with hairpin ostial & ~50% lesions) - Not PCI amenable due to vessel tortuosity; ~40-50% mid LAD;Ramus - no significnat diseaes; small non-dominant RCA   LEFT HEART CATHETERIZATION WITH  CORONARY ANGIOGRAM N/A 06/16/2013   Procedure: LEFT HEART CATHETERIZATION WITH CORONARY ANGIOGRAM;  Surgeon: Marykay Lex, MD;  Location: [facility] CATH LAB;  Service: Cardiovascular;  Laterality: N/A;   TRANSTHORACIC ECHOCARDIOGRAM  06/17/2013   Normal LV size and function. EF 55-60% with no regional WMA. Grade 1 diastolic dysfunction. No significant valvular lesions   TUBAL LIGATION     Patient Active Problem List   Diagnosis Date Noted   Acute metabolic encephalopathy 07/07/2022   Acute encephalopathy 07/06/2022   Nausea vomiting and diarrhea 07/06/2022   Altered mental status 07/05/2022   Falls 05/01/2022   Acute pulmonary embolism (HCC) 04/18/2022   Cholelithiasis 04/18/2022   Wheezing 04/18/2022   Acute respiratory failure with hypoxia (HCC) 04/18/2022   Age-related osteoporosis with current pathological fracture, vertebra(e), initial encounter for fracture (HCC) 04/04/2022   Inflammation of sacroiliac joint (HCC) 04/04/2022   Polyneuropathy 04/04/2022   Subacute cough 03/28/2022   Pedal edema 11/04/2020   Scoliosis of lumbar spine 08/04/2020   Lumbar spondylosis 08/04/2020   Radiculopathy, lumbar region 05/19/2020   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) 07/01/2013   Hypothyroid 06/16/2013   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   Unstable angina - history of 06/13/2013   Hyperlipidemia associated with type 2 diabetes mellitus (HCC)    GERD (gastroesophageal reflux disease)    Essential  hypertension    Type 2 diabetes mellitus treated with insulin (HCC)    Dyspnea    Diverticulosis    HOARSENESS 06/30/2009   OSA treated with BiPAP 01/01/2007   WEIGHT GAIN 01/01/2007    PCP:  Donita Brooks, MD  REFERRING PROVIDER: Ocie Doyne, MD   REFERRING DIAG: cervicalgia  THERAPY DIAG:  Cervicalgia  Nonintractable headache, unspecified chronicity pattern, unspecified headache type  Neuralgia and neuritis  Rationale for Evaluation  and Treatment: Rehabilitation  ONSET DATE: Christmas season 2023.   PERTINENT HISTORY:  Patient is a 82 y.o. female who presents to outpatient physical therapy with a referral for medical diagnosis cervicalgia. This patient's chief complaints consist of left sided neck pain and stiffness and headache that is worst near the mastoid process and intermittently radiates to the top of her head, temple, and behind left eye, and left UE pain leading to the following functional deficits: difficulty feeling like "doing much of nothing," watching TV, being with family, crosswords, play solitaire on her phone, turning her head, flexing or extending neck, prolonged sitting, traveling. 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 She  has a past medical history of Abnormal weight gain, Acute diverticulitis (05/13/2021), Arthritis, COPD (chronic obstructive pulmonary disease) (HCC), Coronary artery disease, non-occlusive (06/13/2013), Diverticulosis, GERD (gastroesophageal reflux disease), History of  stress test, Hyperlipidemia, Hypertension, NIDDM (non-insulin dependent diabetes mellitus), OSA (obstructive sleep apnea), and Tracheobronchomalacia. She  has a past surgical history that includes Tubal ligation; Cardiac catheterization (06/2013); transthoracic echocardiogram (06/17/2013); and left heart catheterization with coronary angiogram (N/A, 06/16/2013).  Patient/DIL denies hx of cancer, stroke, seizures, unexplained weight loss, unexplained changes in bowel or bladder problems, unexplained stumbling or dropping things, and spinal surgery.  SUBJECTIVE:  SUBJECTIVE STATEMENT: Patient arrives with her DIL [name]. They state she is doing her HEP since last PT session. Patient states she cannot really tell that much has changed with her neck pain or from the dry needling. [name] states she did not complain of her neck pain for the rest of the day after last PT session. Patient states she is willing to participate in dry needling again today.   PAIN:  NPRS: 5/10 left sided headache and neck pain, worst at left ear  PRECAUTIONS: Fall   PATIENT GOALS: "want to get her head to quit hurting and would like to get my back to quit  hurting"   NEXT MD VISIT: 6 months from the last visit  OBJECTIVE  SELF-REPORTED FUNCTION FOTO score: 49/100 (neck questionnaire)  TODAY'S TREATMENT: Therapeutic exercise: to centralize symptoms and improve ROM, strength, muscular endurance, and activity tolerance required for successful completion of functional activities.  - Upper body ergometer level 1 to encourage joint nutrition, warm tissue, induce analgesic effect of aerobic exercise, improve muscular strength and endurance,  and prepare for remainder of session. 5 min total, mostly backwards. - prone scapular  retractoin, 3x10 - prone cervical spine retraction, 3x10 - hooklying chin tuck with lift, 5x10 seconds. Heavy cuing for chin position.  - sit <> prone, prone <> supine, supine <> sit with min A  Manual therapy: to reduce pain and tissue tension, improve range of motion, neuromodulation, in order to promote improved ability to complete functional activities. PRONE - STM to B suboccipital and cervical paraspinal muscles and upper traps HOOKLYING - STM to B suboccipital muscles, cervical paraspinals, masseter, and temporalis muscles.  - contract-relax technique for PROM suboccipital muscle stretch, 1x5 reps with 6 second holds/relax time.   Modality: Dry needling performed to posterior cervical spine to decrease pain and spasms along patient's neck and region with patient in prone utilizing 2 dry needle(s) .25mm x 30mm with 1 stick each side at suboccipital muscles, mid cervical multifidi, and 1 stick to left UT an 2 sticks to right UT. Patient educated about the risks and benefits from therapy and verbally consents to treatment.  Dry needling performed by Luretha Murphy. Ilsa Iha PT, DPT who is certified in this technique  Pt required multimodal cuing for proper technique and to facilitate improved neuromuscular control, strength, range of motion, and functional ability resulting in improved performance and form.  PATIENT EDUCATION:  Education details: Exercise purpose/form. Self management techniques. Education on diagnosis, prognosis, POC, anatomy and physiology of current condition. Education on HEP including handout. Person educated: Patient and Caregiver DIL [name] Education method: Explanation, Demonstration, Tactile cues, Verbal cues, and Handouts Education comprehension: verbalized understanding, returned demonstration, and needs further education  HOME EXERCISE PROGRAM: Access Code: [email or web address] URL: [email or web address] Date: 11/02/2022 Prepared by: Norton Blizzard  Exercises -  Supine Deep Neck Flexor Nods  - 1-2 x daily - 1 sets - 10 reps - 10 seconds hold - Sidelying Thoracic Rotation with Open Book  - 1 x daily - 1-2 sets - 10 reps - 5 seconds hold - Seated Cervical Retraction  - 1-2 x daily - 3 sets - 10 reps - 1 seconds hold - Seated Shoulder Row with Anchored Resistance  - 1 x daily - 3 sets - 15 reps  HOME EXERCISE PROGRAM [[email or web address]] View at [email or web address]" using code: [email or web address] Suboccipital Stretch -  Repeat 3 Repetitions, Hold 1 Minute, Complete 1 Set, Perform 1 Times a Day  ASSESSMENT:  CLINICAL IMPRESSION: Patient arrives with her DIL  Revonda Standard, who participated throughout the session. Patient continues to have pain neck pain radiating towards the left ear and the top of her head and behind her eye. Today's session progressed dry needling to include upper traps and patient reported some reproduction of symptoms with STM an dry needling to left suboccipital region and cervical paraspinals. She seemed to tolerate dry needling and and manual therapy better today and benefited from review of deep neck flexor strengthening exercises. She demonstrates improving tolerance for exercises an improving motor control. However, she continues to have significant weakness, pain, and poor motor control. Patient would benefit from continued management of limiting condition by skilled physical therapist to address remaining impairments and functional limitations to work towards stated goals and return to PLOF or maximal functional independence.   From Initial PT Evaluation 10/25/2022:  Patient is a 82 y.o. female referred to outpatient physical therapy with a medical diagnosis of cervicalgia who presents with signs and symptoms consistent with left sided neck pain with radiation to the left side of the head reproducing patient's headache complaint. Patient also has limited AROM especially into left rotation that is associated with the neck pain and headache and left arm discomfort  possibly radiating from left sided neck. Patient presents with significant posture, joint stiffness, ROM, motor control, muscle tension, muscle performance (strength/power/endurance) and activity tolerance impairments that are limiting ability to complete usual activities such as watching TV, being with family, crosswords, play solitaire on her phone, turning her head, flexing or extending neck, prolonged sitting, traveling without difficulty and making her feel like "doing much of nothing." Patient will benefit from skilled physical therapy intervention to address current body structure impairments and activity limitations to improve function and work towards goals set in current POC in order to return to prior level of function or maximal functional improvement.    OBJECTIVE IMPAIRMENTS: Abnormal gait, decreased activity tolerance, decreased cognition, decreased coordination, decreased endurance, decreased knowledge of condition, decreased mobility, difficulty walking, decreased ROM, decreased strength, hypomobility, impaired perceived functional ability, increased muscle spasms, impaired flexibility, impaired UE functional use, improper body mechanics, postural dysfunction, and pain.   ACTIVITY LIMITATIONS: carrying, lifting, sitting, sleeping, transfers, bed mobility, bathing, dressing, hygiene/grooming, and locomotion level  PARTICIPATION LIMITATIONS: meal prep, cleaning, interpersonal relationship, shopping, community activity, and   difficulty feeling like "doing much of nothing," watching TV, being with family, crosswords, play solitaire on her phone, turning her head, flexing or extending neck, prolonged sitting, traveling  PERSONAL FACTORS: Age, Fitness, Past/current experiences, Time since onset of injury/illness/exacerbation, and 3+ comorbidities:   OSA treated with BiPAP; WEIGHT GAIN; HOARSENESS; Hyperlipidemia associated with type 2 diabetes mellitus (HCC); GERD (gastroesophageal reflux  disease); Essential hypertension; Type 2 diabetes mellitus treated with insulin (HCC); Dyspnea; Diverticulosis; Unstable angina - history of; Hypothyroid; Tracheobronchomalacia; 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 She  has a past medical history of Abnormal weight gain, Acute diverticulitis (05/13/2021), Arthritis, COPD (chronic obstructive pulmonary disease) (HCC), Coronary artery disease, non-occlusive (06/13/2013), Diverticulosis, GERD (gastroesophageal reflux disease), History of stress test, Hyperlipidemia, Hypertension, NIDDM (non-insulin dependent diabetes mellitus), OSA (obstructive sleep apnea), and Tracheobronchomalacia. She  has a past surgical history that includes Tubal ligation; Cardiac catheterization (06/2013); transthoracic echocardiogram (06/17/2013); and left heart catheterization with coronary angiogram (N/A, 06/16/2013) are also affecting patient's functional outcome.   REHAB POTENTIAL: Good  CLINICAL DECISION MAKING: Evolving/moderate complexity  EVALUATION COMPLEXITY: Moderate   GOALS: Goals reviewed with patient? No  SHORT TERM GOALS: Target date: 11/08/2022  Patient will be independent with initial home exercise program for self-management of symptoms. Baseline: Initial HEP provided at IE (10/25/22); Goal status: In-progress   LONG TERM GOALS: Target date: 01/17/2023  Patient will be independent with a long-term home  exercise program for self-management of symptoms.  Baseline: Initial HEP provided at IE (10/25/22); Goal status: In-progress  2.  Patient will demonstrate improved FOTO by equal or greater than 10 points by visit #10 to demonstrate improvement in overall condition and self-reported functional ability.  Baseline: to be measured visit 2 as appropriate (10/25/22); Goal status: In-progress  3.  Patient will improve cervical spine rotation to equal or greater than 60 degrees each direction without increased pain to improve ability to view her surroundings without difficulty.  Baseline: R/L 67/50 with concordant pain (10/25/22); Goal status: In-progress  4.  Patient will score equal or greater than 26 seconds on the Deep Neck Flexor Endurance Test to demonstrate improved neck strength/endurance and reach age matched norm in healthy women to improve her ability to complete her daily activities with less difficulty due to neck pain associated with fatigue.  Baseline: to be tested visit 2 as appropriate (10/25/22); Goal status: In-progress  5.  Patient will complete community, work and/or recreational activities with 50% less limitation due to current condition.  Baseline: difficulty feeling like "doing much of nothing," watching TV, being with family, crosswords, play solitaire on her phone, turning her head, flexing or extending neck, prolonged sitting, traveling (10/25/22); Goal status: In-progress   PLAN:  PT FREQUENCY: 1-2x/week  PT DURATION: 12 weeks  PLANNED INTERVENTIONS: Therapeutic exercises, Therapeutic activity, Neuromuscular re-education, Patient/Family education, Self Care, DME instructions, Dry Needling, Electrical stimulation, Cryotherapy, Moist heat, Manual therapy, and Re-evaluation  PLAN FOR NEXT SESSION: update HEP as appropriate, progressive postural/UE/functional strengthening, motor control, and ROM exercises as appropriate. Education. Manual therapy as needed.    Cira Rue, PT, DPT 11/08/2022, 3:43 PM  [facility] [address] [place], [place] [address] P: [phone] I F: [phone]

## 2022-11-13 ENCOUNTER — Ambulatory Visit: Payer: Medicare Other | Admitting: Physical Therapy

## 2022-11-13 ENCOUNTER — Encounter: Payer: Self-pay | Admitting: Physical Therapy

## 2022-11-13 DIAGNOSIS — M792 Neuralgia and neuritis, unspecified: Secondary | ICD-10-CM

## 2022-11-13 DIAGNOSIS — M542 Cervicalgia: Secondary | ICD-10-CM

## 2022-11-13 DIAGNOSIS — R519 Headache, unspecified: Secondary | ICD-10-CM

## 2022-11-13 NOTE — Therapy (Signed)
OUTPATIENT PHYSICAL THERAPY TREATMENT   Patient Name: April [name] MRN: [id number] DOB:1940-05-05, 82 y.o., female Today's Date: 11/13/2022  END OF SESSION:  PT End of Session - 11/13/22 1354     Visit Number 6    Number of Visits 13    Date for PT Re-Evaluation 01/17/23    Authorization Type [employer] MEDICARE reporting period from 10/25/2022    Progress Note Due on Visit 10    PT Start Time 1354    PT Stop Time 1432    PT Time Calculation (min) 38 min    Activity Tolerance Patient tolerated treatment well    Behavior During Therapy [facility] for tasks assessed/performed              Past Medical History:  Diagnosis Date   Abnormal weight gain    Acute diverticulitis 05/13/2021   Arthritis    "joints" (06/13/2013)   COPD (chronic obstructive pulmonary disease) (HCC)    Coronary artery disease, non-occlusive 06/13/2013   Cardiac Cath: sharp Angle take-off of Large Dominant Cx: ~70-80% mid Cx bifurcation lesion (Lateral OM &  AVGCx-PL-PDA both with hairpin ostial & ~50% lesions) - Not PCI amenable due to vessel tortuosity; ~40-50% mid LAD;Ramus - no significnat diseaes; small non-dominant RCA   Diverticulosis    GERD (gastroesophageal reflux disease)    History of stress test    a. 09/2006 nl Dobutamine Echo   Hyperlipidemia    Hypertension    NIDDM (non-insulin dependent diabetes mellitus)    On home oxygen therapy    "2L q hs; runs into my BIPAP" (06/13/2013)   OSA (obstructive sleep apnea)    "BIPAP w/O2" (06/13/2013)   Tracheobronchomalacia    a. followed by [facility]   Past Surgical History:  Procedure Laterality Date   CARDIAC CATHETERIZATION  06/2013   Cardiac Cath: sharp Angle take-off of Large Dominant Cx: ~70-80% mid Cx bifurcation lesion (Lateral OM &  AVGCx-PL-PDA both with hairpin ostial & ~50% lesions) - Not PCI amenable due to vessel tortuosity; ~40-50% mid LAD;Ramus - no significnat diseaes; small non-dominant RCA   LEFT HEART CATHETERIZATION WITH CORONARY  ANGIOGRAM N/A 06/16/2013   Procedure: LEFT HEART CATHETERIZATION WITH CORONARY ANGIOGRAM;  Surgeon: Marykay Lex, MD;  Location: [facility] CATH LAB;  Service: Cardiovascular;  Laterality: N/A;   TRANSTHORACIC ECHOCARDIOGRAM  06/17/2013   Normal LV size and function. EF 55-60% with no regional WMA. Grade 1 diastolic dysfunction. No significant valvular lesions   TUBAL LIGATION     Patient Active Problem List   Diagnosis Date Noted   Acute metabolic encephalopathy 07/07/2022   Acute encephalopathy 07/06/2022   Nausea vomiting and diarrhea 07/06/2022   Altered mental status 07/05/2022   Falls 05/01/2022   Acute pulmonary embolism (HCC) 04/18/2022   Cholelithiasis 04/18/2022   Wheezing 04/18/2022   Acute respiratory failure with hypoxia (HCC) 04/18/2022   Age-related osteoporosis with current pathological fracture, vertebra(e), initial encounter for fracture (HCC) 04/04/2022   Inflammation of sacroiliac joint (HCC) 04/04/2022   Polyneuropathy 04/04/2022   Subacute cough 03/28/2022   Pedal edema 11/04/2020   Scoliosis of lumbar spine 08/04/2020   Lumbar spondylosis 08/04/2020   Radiculopathy, lumbar region 05/19/2020   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) 07/01/2013   Hypothyroid 06/16/2013   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   Unstable angina - history of 06/13/2013   Hyperlipidemia associated with type 2 diabetes mellitus (HCC)    GERD (gastroesophageal reflux disease)    Essential hypertension  Type 2 diabetes mellitus treated with insulin (HCC)    Dyspnea    Diverticulosis    HOARSENESS 06/30/2009   OSA treated with BiPAP 01/01/2007   WEIGHT GAIN 01/01/2007    PCP:  Donita Brooks, MD  REFERRING PROVIDER: Ocie Doyne, MD   REFERRING DIAG: cervicalgia  THERAPY DIAG:  Cervicalgia  Nonintractable headache, unspecified chronicity pattern, unspecified headache type  Neuralgia and neuritis  Rationale for Evaluation and  Treatment: Rehabilitation  ONSET DATE: Christmas season 2023.   PERTINENT HISTORY:  Patient is a 82 y.o. female who presents to outpatient physical therapy with a referral for medical diagnosis cervicalgia. This patient's chief complaints consist of left sided neck pain and stiffness and headache that is worst near the mastoid process and intermittently radiates to the top of her head, temple, and behind left eye, and left UE pain leading to the following functional deficits: difficulty feeling like "doing much of nothing," watching TV, being with family, crosswords, play solitaire on her phone, turning her head, flexing or extending neck, prolonged sitting, traveling. 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 She  has a past medical history of Abnormal weight gain, Acute diverticulitis (05/13/2021), Arthritis, COPD (chronic obstructive pulmonary disease) (HCC), Coronary artery disease, non-occlusive (06/13/2013), Diverticulosis, GERD (gastroesophageal reflux disease), History of stress  test, Hyperlipidemia, Hypertension, NIDDM (non-insulin dependent diabetes mellitus), OSA (obstructive sleep apnea), and Tracheobronchomalacia. She  has a past surgical history that includes Tubal ligation; Cardiac catheterization (06/2013); transthoracic echocardiogram (06/17/2013); and left heart catheterization with coronary angiogram (N/A, 06/16/2013).  Patient/DIL denies hx of cancer, stroke, seizures, unexplained weight loss, unexplained changes in bowel or bladder problems, unexplained stumbling or dropping things, and spinal surgery.  SUBJECTIVE:  SUBJECTIVE STATEMENT: Patient arrives with her DIL [name] and using SPC. She states her neck hurts a bit more since last PT session. She is unsure if the dry needling helped her or not. She states her neck is really sore on the right side behind her ear. [name] said she thinks patient has been feeling better. She states she can tell it seems like it is lingering but not pounding like it was before. She states patient has seemed in a better mood and not complaining about the pain as much, with less pain behaviors since last PT session. She states patient is doing her exercises. [name] states patient not really able to tell others if her pain is getting better or not. [name] wonders if the way she sleeps or sits is contributing to her pain. [name] thinks the dry needling last session helped her.   PAIN:  NPRS: 6/10 left sided headache to top of head and neck pain, worst at left ear. Also sore and "feels like it might be bruised" behind the right ear.   PRECAUTIONS: Fall  PATIENT GOALS: "want to get her head to quit hurting and would like to get my back to quit  hurting"   NEXT MD VISIT: 6 months from the last visit  OBJECTIVE  TODAY'S TREATMENT: Therapeutic  exercise: to centralize symptoms and improve ROM, strength, muscular endurance, and activity tolerance required for successful completion of functional activities.  - NuStep level 1 using bilateral upper and lower extremities. Seat/handle setting 7/8. For improved extremity mobility, muscular endurance, and activity tolerance; and to induce the analgesic effect of aerobic exercise, stimulate improved joint nutrition, and prepare body structures and systems for following interventions. x 8  minutes. Average SPM = 57. Cued to keep SPM above 60.  - prone scapular retractoin, 3x10 - prone cervical spine retraction, 3x10 - hooklying chin tuck with lift, 10x10 seconds. Moderate cuing for chin position.  - sit <> prone, prone <> supine, supine <> sit with min A  Manual therapy: to reduce pain and tissue tension, improve range of motion, neuromodulation, in order to promote improved ability to complete functional activities. PRONE - STM to B suboccipital and cervical paraspinal muscles and upper traps  Modality: Dry needling performed to posterior cervical spine to decrease pain and spasms along patient's neck and region with patient in prone utilizing 2 dry needle(s) .25mm x 30mm with 1 stick each side at suboccipital muscles, 2 sticks left mid cervical multifidi, and 1 stick at right mid- cervical multifidi. Patient educated about the risks and benefits from therapy and verbally consents to treatment.  Dry needling performed by Luretha Murphy. Ilsa Iha PT, DPT who is certified in this technique  Pt required multimodal cuing for proper technique and to facilitate improved neuromuscular control, strength, range of motion, and functional ability resulting in improved performance and form.  PATIENT EDUCATION:  Education details: Exercise purpose/form. Self management techniques. Education on diagnosis, prognosis, POC, anatomy and physiology of current condition. Education on HEP including handout. Person educated:  Patient and Caregiver DIL [name] Education method: Explanation, Demonstration, Tactile cues, Verbal cues, and Handouts Education comprehension: verbalized understanding, returned demonstration, and needs further education  HOME EXERCISE PROGRAM: Access Code: [email or web address] URL: [email or web address] Date: 11/02/2022 Prepared by: Norton Blizzard  Exercises - Supine Deep Neck Flexor Nods  - 1-2 x daily - 1 sets - 10 reps - 10 seconds hold - Sidelying Thoracic Rotation with Open Book  - 1 x daily - 1-2 sets - 10 reps -  5 seconds hold - Seated Cervical Retraction  - 1-2 x daily - 3 sets - 10 reps - 1 seconds hold - Seated Shoulder Row with Anchored Resistance  - 1 x daily - 3 sets - 15 reps  HOME EXERCISE PROGRAM [[email or web address]] View at [email or web address]" using code: [email or web address] Suboccipital Stretch -  Repeat 3 Repetitions, Hold 1 Minute, Complete 1 Set, Perform 1 Times a Day  ASSESSMENT:  CLINICAL IMPRESSION: Patient arrives with her DIL [name], who participated throughout the session. Patient seems unable to tell that her pain is improving, but [name] reports she seems to have less pain behaviors and complaints since last PT session. Patient willing to continue with dry needling. She tolerated it well and appears to be improving tolerance for strengtheing exercises as well. Patient contineus to reqiure heavy cuing and some physical assistance to copmlete exercises effectively and and safely. She does show improving proficiency and familiarity with exercises and movement. Patient and DIL was provided with education on sleeping position and use of pillows that may improve her comfort upon waking. Patient would benefit from continued management of limiting condition by skilled physical therapist to address remaining impairments and functional limitations to work towards stated goals and return to PLOF or maximal functional independence.  From Initial PT Evaluation 10/25/2022:  Patient is a 82 y.o.  female referred to outpatient physical therapy with a medical diagnosis of cervicalgia who presents with signs and symptoms consistent with left sided neck pain with radiation to the left side of the head reproducing patient's headache complaint. Patient also has limited AROM especially into left rotation that is associated with the neck pain and headache and left arm discomfort possibly radiating from left sided neck. Patient presents with significant posture, joint stiffness, ROM, motor control, muscle tension, muscle performance (strength/power/endurance) and activity tolerance impairments that are limiting ability to complete usual activities such as watching TV, being with family, crosswords, play solitaire on her phone, turning her head, flexing or extending neck, prolonged sitting, traveling without difficulty and making her feel like "doing much of nothing." Patient will benefit from skilled physical therapy intervention to address current body structure impairments and activity limitations to improve function and work towards goals set in current POC in order to return to prior level of function or maximal functional improvement.    OBJECTIVE IMPAIRMENTS: Abnormal gait, decreased activity tolerance, decreased cognition, decreased coordination, decreased endurance, decreased knowledge of condition, decreased mobility, difficulty walking, decreased ROM, decreased strength, hypomobility, impaired perceived functional ability, increased muscle spasms, impaired flexibility, impaired UE functional use, improper body mechanics, postural dysfunction, and pain.   ACTIVITY LIMITATIONS: carrying, lifting, sitting, sleeping, transfers, bed mobility, bathing, dressing, hygiene/grooming, and locomotion level  PARTICIPATION LIMITATIONS: meal prep, cleaning, interpersonal relationship, shopping, community activity, and   difficulty feeling like "doing much of nothing," watching TV, being with family, crosswords,  play solitaire on her phone, turning her head, flexing or extending neck, prolonged sitting, traveling  PERSONAL FACTORS: Age, Fitness, Past/current experiences, Time since onset of injury/illness/exacerbation, and 3+ comorbidities:   OSA treated with BiPAP; WEIGHT GAIN; HOARSENESS; Hyperlipidemia associated with type 2 diabetes mellitus (HCC); GERD (gastroesophageal reflux disease); Essential hypertension; Type 2 diabetes mellitus treated with insulin (HCC); Dyspnea; Diverticulosis; Unstable angina - history of; Hypothyroid; Tracheobronchomalacia; 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); Pedal edema; Scoliosis of lumbar spine; Lumbar spondylosis; Radiculopathy, lumbar region; Subacute cough; Age-related osteoporosis with current pathological fracture, vertebra(e), initial encounter for fracture (HCC); Inflammation of sacroiliac joint (HCC);  Polyneuropathy; Acute pulmonary embolism (HCC); Cholelithiasis; Wheezing; Acute respiratory failure with hypoxia (HCC); Falls; Altered mental status; Acute encephalopathy; Nausea vomiting and diarrhea; and Acute metabolic encephalopathy on their problem list. She  has a past medical history of Abnormal weight gain, Acute diverticulitis (05/13/2021), Arthritis, COPD (chronic obstructive pulmonary disease) (HCC), Coronary artery disease, non-occlusive (06/13/2013), Diverticulosis, GERD (gastroesophageal reflux disease), History of stress test, Hyperlipidemia, Hypertension, NIDDM (non-insulin dependent diabetes mellitus), OSA (obstructive sleep apnea), and Tracheobronchomalacia. She  has a past surgical history that includes Tubal ligation; Cardiac catheterization (06/2013); transthoracic echocardiogram (06/17/2013); and left heart catheterization with coronary angiogram (N/A, 06/16/2013) are also affecting patient's functional outcome.   REHAB POTENTIAL: Good  CLINICAL DECISION MAKING: Evolving/moderate complexity  EVALUATION  COMPLEXITY: Moderate   GOALS: Goals reviewed with patient? No  SHORT TERM GOALS: Target date: 11/08/2022  Patient will be independent with initial home exercise program for self-management of symptoms. Baseline: Initial HEP provided at IE (10/25/22); Goal status: In-progress   LONG TERM GOALS: Target date: 01/17/2023  Patient will be independent with a long-term home exercise program for self-management of symptoms.  Baseline: Initial HEP provided at IE (10/25/22); Goal status: In-progress  2.  Patient will demonstrate improved FOTO by equal or greater than 10 points by visit #10 to demonstrate improvement in overall condition and self-reported functional ability.  Baseline: to be measured visit 2 as appropriate (10/25/22); Goal status: In-progress  3.  Patient will improve cervical spine rotation to equal or greater than 60 degrees each direction without increased pain to improve ability to view her surroundings without difficulty.  Baseline: R/L 67/50 with concordant pain (10/25/22); Goal status: In-progress  4.  Patient will score equal or greater than 26 seconds on the Deep Neck Flexor Endurance Test to demonstrate improved neck strength/endurance and reach age matched norm in healthy women to improve her ability to complete her daily activities with less difficulty due to neck pain associated with fatigue.  Baseline: to be tested visit 2 as appropriate (10/25/22); Goal status: In-progress  5.  Patient will complete community, work and/or recreational activities with 50% less limitation due to current condition.  Baseline: difficulty feeling like "doing much of nothing," watching TV, being with family, crosswords, play solitaire on her phone, turning her head, flexing or extending neck, prolonged sitting, traveling (10/25/22); Goal status: In-progress   PLAN:  PT FREQUENCY: 1-2x/week  PT DURATION: 12 weeks  PLANNED INTERVENTIONS: Therapeutic exercises, Therapeutic  activity, Neuromuscular re-education, Patient/Family education, Self Care, DME instructions, Dry Needling, Electrical stimulation, Cryotherapy, Moist heat, Manual therapy, and Re-evaluation  PLAN FOR NEXT SESSION: update HEP as appropriate, progressive postural/UE/functional strengthening, motor control, and ROM exercises as appropriate. Education. Manual therapy as needed.    Cira Rue, PT, DPT 11/13/2022, 9:10 PM  [facility] [address] [place], [place] [address] P: [phone] I F: [phone]

## 2022-11-15 ENCOUNTER — Encounter: Payer: Self-pay | Admitting: Cardiology

## 2022-11-15 ENCOUNTER — Other Ambulatory Visit: Payer: Self-pay | Admitting: Family Medicine

## 2022-11-15 ENCOUNTER — Ambulatory Visit: Payer: Medicare Other | Attending: Cardiology | Admitting: Cardiology

## 2022-11-15 VITALS — BP 132/68 | HR 69 | Ht 60.0 in | Wt 133.0 lb

## 2022-11-15 DIAGNOSIS — E785 Hyperlipidemia, unspecified: Secondary | ICD-10-CM

## 2022-11-15 DIAGNOSIS — I200 Unstable angina: Secondary | ICD-10-CM

## 2022-11-15 DIAGNOSIS — I350 Nonrheumatic aortic (valve) stenosis: Secondary | ICD-10-CM | POA: Diagnosis not present

## 2022-11-15 DIAGNOSIS — I10 Essential (primary) hypertension: Secondary | ICD-10-CM | POA: Diagnosis not present

## 2022-11-15 DIAGNOSIS — I2699 Other pulmonary embolism without acute cor pulmonale: Secondary | ICD-10-CM

## 2022-11-15 DIAGNOSIS — I25119 Atherosclerotic heart disease of native coronary artery with unspecified angina pectoris: Secondary | ICD-10-CM

## 2022-11-15 DIAGNOSIS — E1169 Type 2 diabetes mellitus with other specified complication: Secondary | ICD-10-CM

## 2022-11-15 DIAGNOSIS — R634 Abnormal weight loss: Secondary | ICD-10-CM

## 2022-11-15 DIAGNOSIS — G4733 Obstructive sleep apnea (adult) (pediatric): Secondary | ICD-10-CM

## 2022-11-15 DIAGNOSIS — I5189 Other ill-defined heart diseases: Secondary | ICD-10-CM

## 2022-11-15 MED ORDER — ROSUVASTATIN CALCIUM 20 MG PO TABS
20.0000 mg | ORAL_TABLET | Freq: Every day | ORAL | 3 refills | Status: AC
Start: 2022-11-15 — End: 2023-02-13

## 2022-11-15 NOTE — Patient Instructions (Addendum)
Medication Instructions:   Decrease Rosuvastatin to 20 mg daily     Once Dr Tanya Nones stops medication --taking Eliquis  then start taking Aspirin 81 mg daily  *If you need a refill on your cardiac medications before your next appointment, please call your pharmacy*   Lab Work:  Please ask  primary to check Lipid and Hga1c at next visit   If you have labs (blood work) drawn today and your tests are completely normal, you will receive your results only by: MyChart Message (if you have MyChart) OR A paper copy in the mail If you have any lab test that is abnormal or we need to change your treatment, we will call you to review the results.   Testing/Procedures: Not needed   Follow-Up: At [facility], you and your health needs are our priority.  As part of our continuing mission to provide you with exceptional heart care, we have created designated Provider Care Teams.  These Care Teams include your primary Cardiologist (physician) and Advanced Practice Providers (APPs -  Physician Assistants and Nurse Practitioners) who all work together to provide you with the care you need, when you need it.     Your next appointment:   12 month(s)  The format for your next appointment:   In Person  Provider:   Bryan Lemma, MD

## 2022-11-15 NOTE — Progress Notes (Signed)
Cardiology Office Note:  .   Date:  11/19/2022  ID:  April [name], DOB Apr 29, 1940, MRN [id number] PCP: Donita Brooks, MD  [facility] Providers Cardiologist:  Bryan Lemma, MD     No chief complaint on file.   Patient Profile: April [name] is a formerly obese 82 y.o. female with a PMH notable for CAD (Med Rx of LCx 80%), ~very mild AS, COPD - chronic dyspnea, OSA-CPAP (w/O2) who presents here for delayed annual-36-month follow-up.  She returns at the request of Donita Brooks, MD.  May 2015: Single-vessel CAD (severe LCx disease-not approachable by PCI),   April [name] was last seen on May 09, 2021-doing fairly well.  Just chronic dizziness and exertional dyspnea but no change in baseline.  Rare fluttering sensations lasting a few seconds but no sustained rhythms.  No chest pain or pressure at rest exertion.  Trivial edema.  No PND orthopnea-using BiPAP and oxygen.  Occasional dizziness with changing positions quickly.      Subjective   INTERVAL HPI Discussed the use of AI scribe software for clinical note transcription with the patient, who gave verbal consent to proceed.  => Admitted in March 2024 with PE.  Started Eliquis.  History of Present Illness   The patient, with a history of pulmonary embolism, gallstones, pneumonia, and diabetes, presents for a follow-up visit after two hospitalizations within the past year. The first hospitalization was due to pulmonary embolism, which the patient believes was a result of a previous COVID-19 infection. The patient was started on Eliquis for the blood clots and has been on it for approximately six months. The second hospitalization was due to pneumonia, which was treated with antibiotics.  The patient has also been experiencing some shortness of breath when lying flat but does not wake up short of breath at night. The patient uses a BiPAP machine for sleep. The patient denies any chest pain, heart  palpitations, or dizziness. No edema.  NO syncope/near syncope, TIA/amaurosis fuga/CVA symptoms.  No claudication.   In addition to these health issues, the patient has been dealing with significant weight loss over the past year, losing about forty pounds. This weight loss began after the death of the patient's husband (in January) and continued with the patient's subsequent health issues. The patient has also been managing diabetes with metformin and occasional use of Lantus. The patient's blood sugar levels have been well-controlled with this regimen.      Cardiovascular ROS: positive for - dyspnea on exertion, edema, orthopnea, shortness of breath, and all pretty stable; significant weight loss.  Improving fatigue. negative for - chest pain, palpitations, paroxysmal nocturnal dyspnea, rapid heart rate, or syncope or near syncope, TIA or amaurosis fugax, claudication  ROS:  Review of Systems - Negative except symptoms noted above.     Objective   Studies Reviewed: Marland Kitchen   EKG Interpretation Date/Time:  Wednesday November 15 2022 16:11:14 EDT Ventricular Rate:  69 PR Interval:  180 QRS Duration:  72 QT Interval:  404 QTC Calculation: 432 R Axis:   -2  Text Interpretation: Normal sinus rhythm Normal ECG When compared with ECG of 06-Jul-2022 10:21, PREVIOUS ECG IS PRESENT Confirmed by Bryan Lemma ([id number]) on 11/15/2022 4:52:17 PM    Results LABS  (03/2022) Total cholesterol: 66 mg/dL; Triglycerides: 188 mg/dL; HDL: 29 mg/dL; LDL: 11 mg/dl; [id number]: 4.4%.  (06/2022): Hgb 14.5; Cr 0.59, K+ 3.7,   (04/2022) Lower extremity ultrasound: No deep venous  thrombosis  CTA Chest: Lower lung bilateral pulmonary embolism Abdominal ultrasound: Cholelithiasis with sludge, no evidence of inflammation   Echocardiogram (04/19/2021): Normal left ventricular ejection fraction of 60-65%, no wall motion abnormalities, mild aortic stenosis with mean gradient of 10 mmHg  Risk Assessment/Calculations:           Physical Exam:   VS:  BP 132/68 (BP Location: Left Arm, Patient Position: Sitting, Cuff Size: Normal)   Pulse 69   Ht 5' (1.524 m)   Wt 133 lb (60.3 kg)   LMP  (LMP Unknown)   SpO2 93%   BMI 25.97 kg/m    Wt Readings from Last 3 Encounters:  11/15/22 133 lb (60.3 kg)  10/11/22 130 lb (59 kg)  07/20/22 124 lb (56.2 kg)  05/09/2021: 153 lb, 3.2oz (69.5 kg  GEN: Well nourished, well developed in no acute distress; no longer obese.  Notable weight loss NECK: No JVD; No carotid bruits CARDIAC: Normal S1, S2; RRR, soft 1/6 SEM at RUSB but otherwise no notable murmurs, rubs, gallops RESPIRATORY:  Clear to auscultation without rales; nonlabored, good air movement.  Increased AP diameter with prolonged expiration phase but no wheezing or rhonchi. ABDOMEN: Soft, non-tender, non-distended EXTREMITIES: Trivial ankle edema; No deformity      ASSESSMENT AND PLAN: .    Problem List Items Addressed This Visit       Cardiology Problems   Aortic stenosis, mild (Chronic)    Very mild AS identified on echocardiogram with a mean gradient of 10. -Plan for repeat echocardiogram in 3 years to monitor progression = > March 2027       Relevant Medications   rosuvastatin (CRESTOR) 20 MG tablet   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) - Primary (Chronic)    History of significant single-vessel CAD in LCx identified in 2015, managed with Imdur (isosorbide). No recent angina symptoms.  Anginal equivalent was mostly jaw pain-exertional open thankfully, has done well. Continue Imdur 60 mg daily along with Lopressor 25 mg twice daily. Not on aspirin because of Eliquis. On rosuvastatin 40 mg, with most recent LDL 11, we can reduce to 20 mg tablet.4 Continue fish oil 1000 mg daily Recommend rechecking labs at next follow-up visit with PCP. Remains on metformin for diabetes.  No longer on insulin.       Relevant Medications   rosuvastatin (CRESTOR) 20 MG  tablet   Other Relevant Orders   EKG 12-Lead (Completed)   Essential hypertension (Chronic)    Borderline BP today on combination of metoprolol tartrate 25 mg twice daily and Imdur 60 mg daily.  Would possibly allow for mild permissive hypertension especially with her weight loss      Relevant Medications   rosuvastatin (CRESTOR) 20 MG tablet   Other Relevant Orders   EKG 12-Lead (Completed)   Hyperlipidemia associated with type 2 diabetes mellitus (HCC) (Chronic)    Hyperlipidemia Total cholesterol 66, LDL 11, on Crestor (rosuvastatin). Concerns about elevated liver function tests. -Reduce Crestor dose by half. -Check cholesterol levels at next primary care visit.  Diabetes Mellitus A1C 7.3, on metformin. Recent weight loss of 40 pounds. -Continue metformin. -Check A1C at next primary care visit.      Relevant Medications   rosuvastatin (CRESTOR) 20 MG tablet   Pulmonary embolism (HCC) (Chronic)    History of PE in March 2023, likely secondary to COVID-19. Currently on Eliquis. No evidence of right heart strain on echocardiogram. -Continue Eliquis as directed by primary care  physician. ?  Usual treatment options are 6 to 12 months DOAC.  Once she is no longer on DOAC, would switch back to aspirin 81 mg.      Relevant Medications   rosuvastatin (CRESTOR) 20 MG tablet   Unstable angina - history of (Chronic)    Thankfully, no further ACS symptoms despite having significant circumflex lesion.  Not amenable to PCI.  Medical management only.  Continue beta-blocker and Imdur.  Aggressive CAD management.      Relevant Medications   rosuvastatin (CRESTOR) 20 MG tablet   Other Relevant Orders   EKG 12-Lead (Completed)     Other   Excessive body weight loss    Significant weight loss over the last year.  She thinks it was up to 40 pounds.  A lot of this started due to significant depression and morning of her husband's death back in 03/12/2022.  Unfortunate, since she is  emotionally stabilized, she has not been out the back on the weight.  Multiple health issues have also made it hard for her to get back into a full swing of things.  Encouraged adequate nutrition even considering nutrition supplementation.  Need to be careful with diabetes to use diabetes- safe supplements.      OSA treated with BiPAP (Chronic)    Continue BiPAP nightly with oxygen.            Dispo: Return in about 1 year (around 11/15/2023) for 1 Yr Follow-up.   Total time spent: 25 min spent with patient + 17 min spent charting = 42 min      Signed, Marykay Lex, MD, MS Bryan Lemma, M.D., M.S. Interventional Cardiologist  [facility]  Pager # [phone] Phone # [phone] [address] [place], [place] [address]

## 2022-11-19 DIAGNOSIS — R634 Abnormal weight loss: Secondary | ICD-10-CM | POA: Insufficient documentation

## 2022-11-19 NOTE — Assessment & Plan Note (Signed)
Continue BiPAP nightly with oxygen.

## 2022-11-19 NOTE — Assessment & Plan Note (Signed)
Borderline BP today on combination of metoprolol tartrate 25 mg twice daily and Imdur 60 mg daily.  Would possibly allow for mild permissive hypertension especially with her weight loss

## 2022-11-19 NOTE — Assessment & Plan Note (Signed)
Significant weight loss over the last year.  She thinks it was up to 40 pounds.  A lot of this started due to significant depression and morning of her husband's death back in 2022/03/25.  Unfortunate, since she is emotionally stabilized, she has not been out the back on the weight.  Multiple health issues have also made it hard for her to get back into a full swing of things.  Encouraged adequate nutrition even considering nutrition supplementation.  Need to be careful with diabetes to use diabetes- safe supplements.

## 2022-11-19 NOTE — Assessment & Plan Note (Signed)
History of PE in March 2023, likely secondary to COVID-19. Currently on Eliquis. No evidence of right heart strain on echocardiogram. -Continue Eliquis as directed by primary care physician. ?  Usual treatment options are 6 to 12 months DOAC.  Once she is no longer on DOAC, would switch back to aspirin 81 mg.

## 2022-11-19 NOTE — Assessment & Plan Note (Signed)
History of significant single-vessel CAD in LCx identified in 2015, managed with Imdur (isosorbide). No recent angina symptoms.  Anginal equivalent was mostly jaw pain-exertional open thankfully, has done well. Continue Imdur 60 mg daily along with Lopressor 25 mg twice daily. Not on aspirin because of Eliquis. On rosuvastatin 40 mg, with most recent LDL 11, we can reduce to 20 mg tablet.4 Continue fish oil 1000 mg daily Recommend rechecking labs at next follow-up visit with PCP. Remains on metformin for diabetes.  No longer on insulin.

## 2022-11-19 NOTE — Assessment & Plan Note (Signed)
Thankfully, no further ACS symptoms despite having significant circumflex lesion.  Not amenable to PCI.  Medical management only.  Continue beta-blocker and Imdur.  Aggressive CAD management.

## 2022-11-19 NOTE — Assessment & Plan Note (Signed)
Hyperlipidemia Total cholesterol 66, LDL 11, on Crestor (rosuvastatin). Concerns about elevated liver function tests. -Reduce Crestor dose by half. -Check cholesterol levels at next primary care visit.  Diabetes Mellitus A1C 7.3, on metformin. Recent weight loss of 40 pounds. -Continue metformin. -Check A1C at next primary care visit.

## 2022-11-19 NOTE — Assessment & Plan Note (Signed)
Very mild AS identified on echocardiogram with a mean gradient of 10. -Plan for repeat echocardiogram in 3 years to monitor progression = > March 2027

## 2022-11-21 ENCOUNTER — Ambulatory Visit (INDEPENDENT_AMBULATORY_CARE_PROVIDER_SITE_OTHER): Payer: Medicare Other | Admitting: Family Medicine

## 2022-11-21 ENCOUNTER — Encounter: Payer: Self-pay | Admitting: Family Medicine

## 2022-11-21 ENCOUNTER — Ambulatory Visit: Payer: Medicare Other | Admitting: Physical Therapy

## 2022-11-21 VITALS — BP 120/62 | HR 62 | Temp 97.7°F | Ht 60.0 in | Wt 131.8 lb

## 2022-11-21 DIAGNOSIS — E119 Type 2 diabetes mellitus without complications: Secondary | ICD-10-CM | POA: Diagnosis not present

## 2022-11-21 DIAGNOSIS — Z7984 Long term (current) use of oral hypoglycemic drugs: Secondary | ICD-10-CM

## 2022-11-21 DIAGNOSIS — J9601 Acute respiratory failure with hypoxia: Secondary | ICD-10-CM | POA: Diagnosis not present

## 2022-11-21 DIAGNOSIS — J441 Chronic obstructive pulmonary disease with (acute) exacerbation: Secondary | ICD-10-CM

## 2022-11-21 DIAGNOSIS — R4182 Altered mental status, unspecified: Secondary | ICD-10-CM

## 2022-11-21 LAB — URINALYSIS, ROUTINE W REFLEX MICROSCOPIC
Bilirubin Urine: NEGATIVE
Glucose, UA: NEGATIVE
Hgb urine dipstick: NEGATIVE
Ketones, ur: NEGATIVE
Leukocytes,Ua: NEGATIVE
Nitrite: NEGATIVE
Protein, ur: NEGATIVE
Specific Gravity, Urine: 1.02 (ref 1.001–1.035)
pH: 5.5 (ref 5.0–8.0)

## 2022-11-21 MED ORDER — ALBUTEROL SULFATE HFA 108 (90 BASE) MCG/ACT IN AERS: 2.0000 | INHALATION_SPRAY | Freq: Four times a day (QID) | RESPIRATORY_TRACT | 0 refills | Status: AC | PRN

## 2022-11-21 MED ORDER — PREDNISONE 20 MG PO TABS: ORAL_TABLET | ORAL | 0 refills | Status: DC

## 2022-11-21 MED ORDER — AMOXICILLIN-POT CLAVULANATE 875-125 MG PO TABS: 1.0000 | ORAL_TABLET | Freq: Two times a day (BID) | ORAL | 0 refills | Status: DC

## 2022-11-21 NOTE — Progress Notes (Signed)
Subjective:    Patient ID: April [name], female    DOB: 1940-06-23, 82 y.o.   MRN: [id number]  Patient presents today with her daughter-in-law.  History morning she was more confused.  She states that she did not feel well.  She has been coughing for about a week.  The cough is nonproductive.  She states that she feels tight in her chest.  She states that she feels like she is unable to cough up chest congestion.  She does have some shortness of breath.  She any fever urgency or hemoptysis.  On exam today, the patient has faint left basilar crackles in the left lower lobe.  She also has diffuse wheezing and diminished breath sounds.  Patient has not been using albuterol.  She also has been getting recurrent lesions on her lower lip.  Family states that inside the lower lip adjacent to the gumline, the patient is getting canker sore like lesions.  They last approximately 1 week and then spontaneously resolved.  They are sore and tender.  Last week they were present.  However they have resolved by today and are no longer visible. Past Medical History:  Diagnosis Date   Abnormal weight gain    Acute diverticulitis 05/13/2021   Arthritis    "joints" (06/13/2013)   COPD (chronic obstructive pulmonary disease) (HCC)    Coronary artery disease, non-occlusive 06/13/2013   Cardiac Cath: sharp Angle take-off of Large Dominant Cx: ~70-80% mid Cx bifurcation lesion (Lateral OM &  AVGCx-PL-PDA both with hairpin ostial & ~50% lesions) - Not PCI amenable due to vessel tortuosity; ~40-50% mid LAD;Ramus - no significnat diseaes; small non-dominant RCA   Diverticulosis    GERD (gastroesophageal reflux disease)    History of stress test    a. 09/2006 nl Dobutamine Echo   Hyperlipidemia    Hypertension    NIDDM (non-insulin dependent diabetes mellitus)    On home oxygen therapy    "2L q hs; runs into my BIPAP" (06/13/2013)   OSA (obstructive sleep apnea)    "BIPAP w/O2" (06/13/2013)   Tracheobronchomalacia     a. followed by [facility]   Past Surgical History:  Procedure Laterality Date   CARDIAC CATHETERIZATION  06/2013   Cardiac Cath: sharp Angle take-off of Large Dominant Cx: ~70-80% mid Cx bifurcation lesion (Lateral OM &  AVGCx-PL-PDA both with hairpin ostial & ~50% lesions) - Not PCI amenable due to vessel tortuosity; ~40-50% mid LAD;Ramus - no significnat diseaes; small non-dominant RCA   LEFT HEART CATHETERIZATION WITH CORONARY ANGIOGRAM N/A 06/16/2013   Procedure: LEFT HEART CATHETERIZATION WITH CORONARY ANGIOGRAM;  Surgeon: Marykay Lex, MD;  Location: [facility] CATH LAB;  Service: Cardiovascular;  Laterality: N/A;   TRANSTHORACIC ECHOCARDIOGRAM  06/17/2013   Normal LV size and function. EF 55-60% with no regional WMA. Grade 1 diastolic dysfunction. No significant valvular lesions   TUBAL LIGATION     Current Outpatient Medications on File Prior to Visit  Medication Sig Dispense Refill   calcitonin, salmon, (MIACALCIN/FORTICAL) 200 UNIT/ACT nasal spray Place into alternate nostrils.     Calcium Carbonate-Vitamin D (CALCIUM 600 + D PO) Take 1 tablet by mouth daily.     ELIQUIS 5 MG TABS tablet TAKE ONE TABLET (5 MG TOTAL) BY MOUTH TWO (TWO) TIMES DAILY. 180 tablet 1   escitalopram (LEXAPRO) 10 MG tablet TAKE ONE TABLET BY MOUTH ONCE A DAY 30 tablet 3   gabapentin (NEURONTIN) 300 MG capsule Take 300 mg in the afternoon. Take 600  mg pill in AM and PM for a total of 600 mg in the morning, 300 mg in the afternoon, and 600 mg at bedtime 90 capsule 2   gabapentin (NEURONTIN) 600 MG tablet TAKE ONE TABLET BY MOUTH FOUR TIMES DAILY 360 tablet 0   glucose blood (ONETOUCH VERIO) test strip USE AS DIRECTED TO MONITOR BLOOD SUGARS UP TO THREE TIMES DAILY AS NEEDED 100 each 3   isosorbide mononitrate (IMDUR) 60 MG 24 hr tablet TAKE ONE TABLET BY MOUTH ONCE DAILY. (NEED APPT FOR FUTURE REFILLS) 30 tablet 11   LANTUS SOLOSTAR 100 UNIT/ML Solostar Pen INJECT 40-50 UNITS UNDER THE SKIN ONCE EVERY NIGHT AT  BEDTIME. 15 mL 3   metFORMIN (GLUCOPHAGE) 500 MG tablet TAKE ONE TABLET BY MOUTH TWICE (2) DAILY WITH A MEAL 180 tablet 1   metoprolol tartrate (LOPRESSOR) 25 MG tablet TAKE ONE TABLET BY MOUTH TWICE A DAY 180 tablet 3   Omega-3 Fatty Acids (FISH OIL) 1000 MG CAPS Take 1 capsule by mouth daily.     omeprazole (PRILOSEC) 20 MG capsule Take 20 mg by mouth daily.     potassium chloride (KLOR-CON) 10 MEQ tablet Take 10 mEq by mouth 2 (two) times daily.     rosuvastatin (CRESTOR) 20 MG tablet Take 1 tablet (20 mg total) by mouth daily. 90 tablet 3   traMADol (ULTRAM) 50 MG tablet TAKE ONE TABLET BY MOUTH AT BEDTIME AS NEEDED FOR SEVERE PAIN 30 tablet 0   triamcinolone (KENALOG) 0.1 % paste Use as directed 1 Application in the mouth or throat 2 (two) times daily. 5 g 12   No current facility-administered medications on file prior to visit.   Allergies  Allergen Reactions   Neomycin Other (See Comments)    Visual disturbance and swelling in eyes   Social History   Socioeconomic History   Marital status: Married    Spouse name: Not on file   Number of children: 3   Years of education: Not on file   Highest education level: Not on file  Occupational History   Occupation: Engineer, petroleum  Tobacco Use   Smoking status: Former    Current packs/day: 0.00    Average packs/day: 1.5 packs/day for 20.0 years (30.0 ttl pk-yrs)    Types: Cigarettes    Start date: 02/13/1966    Quit date: 02/13/1986    Years since quitting: 36.7   Smokeless tobacco: Never  Vaping Use   Vaping status: Never Used  Substance and Sexual Activity   Alcohol use: No   Drug use: No   Sexual activity: Yes  Other Topics Concern   Not on file  Social History Narrative   Lives in [place] with her husband. 3 children.  Works is Futures trader.Former 30 Pk/yr Smoker (1.5 PPD x 20 yr) - quit in 1998.      Right handed    Wear glasses    Drinks coffee daily (1 cup in am)   Drinks soda caffeine free (coke)   Social  Determinants of Health   Financial Resource Strain: Low Risk  (07/20/2022)   Overall Financial Resource Strain (CARDIA)    Difficulty of Paying Living Expenses: Not hard at all  Food Insecurity: No Food Insecurity (07/20/2022)   Hunger Vital Sign    Worried About Running Out of Food in the Last Year: Never true    Ran Out of Food in the Last Year: Never true  Transportation Needs: No Transportation Needs (07/20/2022)   PRAPARE - Transportation  Lack of Transportation (Medical): No    Lack of Transportation (Non-Medical): No  Physical Activity: Inactive (07/20/2022)   Exercise Vital Sign    Days of Exercise per Week: 0 days    Minutes of Exercise per Session: 0 min  Stress: No Stress Concern Present (07/20/2022)   [employer] of Occupational Health - Occupational Stress Questionnaire    Feeling of Stress : Not at all  Social Connections: Moderately Integrated (07/20/2022)   Social Connection and Isolation Panel [NHANES]    Frequency of Communication with Friends and Family: More than three times a week    Frequency of Social Gatherings with Friends and Family: Three times a week    Attends Religious Services: More than 4 times per year    Active Member of Clubs or Organizations: No    Attends Banker Meetings: Never    Marital Status: Married  Catering manager Violence: Not At Risk (07/20/2022)   Humiliation, Afraid, Rape, and Kick questionnaire    Fear of Current or Ex-Partner: No    Emotionally Abused: No    Physically Abused: No    Sexually Abused: No      Review of Systems  Musculoskeletal:  Positive for back pain.  All other systems reviewed and are negative.      Objective:   Physical Exam Constitutional:      General: She is not in acute distress.    Appearance: Normal appearance. She is obese. She is not ill-appearing, toxic-appearing or diaphoretic.  HENT:     Head: Normocephalic and atraumatic.      Right Ear: External ear normal. There is no  impacted cerumen.     Left Ear: External ear normal. There is no impacted cerumen.     Nose: Nose normal. No congestion or rhinorrhea.     Mouth/Throat:     Mouth: Mucous membranes are moist.     Pharynx: Oropharynx is clear.  Eyes:     General: No scleral icterus.       Right eye: No discharge.        Left eye: No discharge.     Extraocular Movements: Extraocular movements intact.     Conjunctiva/sclera: Conjunctivae normal.     Pupils: Pupils are equal, round, and reactive to light.  Neck:     Vascular: No carotid bruit.  Cardiovascular:     Rate and Rhythm: Normal rate and regular rhythm.     Pulses: Normal pulses.     Heart sounds: Murmur heard.     No friction rub. No gallop.  Pulmonary:     Effort: Pulmonary effort is normal. No respiratory distress.     Breath sounds: No stridor. Examination of the left-lower field reveals rales. Wheezing and rales present. No rhonchi.  Chest:     Chest wall: No tenderness.  Abdominal:     General: Abdomen is flat. Bowel sounds are normal. There is no distension.     Palpations: Abdomen is soft.     Tenderness: There is no abdominal tenderness. There is no right CVA tenderness, left CVA tenderness, guarding or rebound.     Hernia: No hernia is present.  Musculoskeletal:     Cervical back: Normal range of motion and neck supple. No rigidity.     Right lower leg: No edema.     Left lower leg: No edema.  Lymphadenopathy:     Cervical: No cervical adenopathy.  Skin:    Findings: No bruising, erythema, lesion or rash.  Neurological:  Mental Status: She is alert.     Gait: Gait abnormal.     Deep Tendon Reflexes: Reflexes normal.           Assessment & Plan:  Altered mental status, unspecified altered mental status type - Plan: Urinalysis, Routine w reflex microscopic  Type 2 diabetes mellitus treated without insulin (HCC) - Plan: Hemoglobin A1c, CBC with Differential/Platelet, COMPLETE METABOLIC PANEL WITH GFR, Lipid  panel  COPD exacerbation (HCC) I believe the patient is dealing with a COPD exacerbation and possibly early left lower lobe pneumonia.  Begin Augmentin 875 mg twice daily for 10 days.  Use a prednisone taper pack for bronchospasm and wheezing.  Also use albuterol 2 puffs every 4-6 hours as needed for wheezing.  There are no visible lesions to see on the lower lip today but I suspect the patient is dealing with aphthous stomatitis.  I would like the patient to check fasting lab work in the morning including a CBC a CMP and a lipid panel and an A1c.

## 2022-11-22 ENCOUNTER — Ambulatory Visit: Payer: Medicare Other | Admitting: Physical Therapy

## 2022-11-22 ENCOUNTER — Other Ambulatory Visit: Payer: Self-pay

## 2022-11-22 LAB — HEMOGLOBIN A1C
Hgb A1c MFr Bld: 7.6 %{Hb} — ABNORMAL HIGH (ref <5.7)
Mean Plasma Glucose: 171 mg/dL
eAG (mmol/L): 9.5 mmol/L

## 2022-11-22 LAB — CBC WITH DIFFERENTIAL/PLATELET
Absolute Monocytes: 332 {cells}/uL (ref 200–950)
Basophils Absolute: 33 {cells}/uL (ref 0–200)
Basophils Relative: 0.5 %
Eosinophils Absolute: 78 {cells}/uL (ref 15–500)
Eosinophils Relative: 1.2 %
HCT: 45 % (ref 35.0–45.0)
Hemoglobin: 14.3 g/dL (ref 11.7–15.5)
Lymphs Abs: 3621 {cells}/uL (ref 850–3900)
MCH: 29.4 pg (ref 27.0–33.0)
MCHC: 31.8 g/dL — ABNORMAL LOW (ref 32.0–36.0)
MCV: 92.4 fL (ref 80.0–100.0)
MPV: 10.8 fL (ref 7.5–12.5)
Monocytes Relative: 5.1 %
Neutro Abs: 2438 {cells}/uL (ref 1500–7800)
Neutrophils Relative %: 37.5 %
Platelets: 219 10*3/uL (ref 140–400)
RBC: 4.87 10*6/uL (ref 3.80–5.10)
RDW: 12 % (ref 11.0–15.0)
Total Lymphocyte: 55.7 %
WBC: 6.5 10*3/uL (ref 3.8–10.8)

## 2022-11-22 LAB — COMPLETE METABOLIC PANEL WITH GFR
AG Ratio: 1.5 (calc) (ref 1.0–2.5)
ALT: 29 U/L (ref 6–29)
AST: 23 U/L (ref 10–35)
Albumin: 3.9 g/dL (ref 3.6–5.1)
Alkaline phosphatase (APISO): 67 U/L (ref 37–153)
BUN: 21 mg/dL (ref 7–25)
CO2: 31 mmol/L (ref 20–32)
Calcium: 9.6 mg/dL (ref 8.6–10.4)
Chloride: 102 mmol/L (ref 98–110)
Creat: 0.69 mg/dL (ref 0.60–0.95)
Globulin: 2.6 g/dL (ref 1.9–3.7)
Glucose, Bld: 179 mg/dL — ABNORMAL HIGH (ref 65–99)
Potassium: 4.2 mmol/L (ref 3.5–5.3)
Sodium: 139 mmol/L (ref 135–146)
Total Bilirubin: 0.3 mg/dL (ref 0.2–1.2)
Total Protein: 6.5 g/dL (ref 6.1–8.1)
eGFR: 88 mL/min/{1.73_m2} (ref >=60)

## 2022-11-22 LAB — LIPID PANEL
Cholesterol: 113 mg/dL (ref <200)
HDL: 46 mg/dL — ABNORMAL LOW (ref >=50)
LDL Cholesterol (Calc): 39 mg/dL
Non-HDL Cholesterol (Calc): 67 mg/dL (ref <130)
Total CHOL/HDL Ratio: 2.5 (calc) (ref <5.0)
Triglycerides: 227 mg/dL — ABNORMAL HIGH (ref <150)

## 2022-11-22 MED ORDER — EMPAGLIFLOZIN 10 MG PO TABS: 10.0000 mg | ORAL_TABLET | Freq: Every day | ORAL | 3 refills | Status: DC

## 2022-11-23 ENCOUNTER — Other Ambulatory Visit: Payer: Medicare Other

## 2022-11-23 ENCOUNTER — Ambulatory Visit: Payer: Medicare Other | Admitting: Physical Therapy

## 2022-11-28 ENCOUNTER — Encounter: Payer: Self-pay | Admitting: Physical Therapy

## 2022-11-28 ENCOUNTER — Ambulatory Visit: Payer: Medicare Other | Attending: Psychiatry | Admitting: Physical Therapy

## 2022-11-28 DIAGNOSIS — R519 Headache, unspecified: Secondary | ICD-10-CM | POA: Diagnosis not present

## 2022-11-28 DIAGNOSIS — M542 Cervicalgia: Secondary | ICD-10-CM | POA: Insufficient documentation

## 2022-11-28 DIAGNOSIS — M792 Neuralgia and neuritis, unspecified: Secondary | ICD-10-CM | POA: Diagnosis not present

## 2022-11-28 NOTE — Therapy (Signed)
OUTPATIENT PHYSICAL THERAPY TREATMENT   Patient Name: April [name] MRN: [id number] DOB:1940/03/12, 82 y.o., female Today's Date: 11/28/2022  END OF SESSION:  PT End of Session - 11/28/22 1114     Visit Number 7    Number of Visits 13    Date for PT Re-Evaluation 01/17/23    Authorization Type [employer] MEDICARE reporting period from 10/25/2022    Progress Note Due on Visit 10    PT Start Time 1115    PT Stop Time 1205    PT Time Calculation (min) 50 min    Activity Tolerance Patient tolerated treatment well    Behavior During Therapy [facility] for tasks assessed/performed               Past Medical History:  Diagnosis Date   Abnormal weight gain    Acute diverticulitis 05/13/2021   Arthritis    "joints" (06/13/2013)   COPD (chronic obstructive pulmonary disease) (HCC)    Coronary artery disease, non-occlusive 06/13/2013   Cardiac Cath: sharp Angle take-off of Large Dominant Cx: ~70-80% mid Cx bifurcation lesion (Lateral OM &  AVGCx-PL-PDA both with hairpin ostial & ~50% lesions) - Not PCI amenable due to vessel tortuosity; ~40-50% mid LAD;Ramus - no significnat diseaes; small non-dominant RCA   Diverticulosis    GERD (gastroesophageal reflux disease)    History of stress test    a. 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   Past Surgical History:  Procedure Laterality Date   CARDIAC CATHETERIZATION  06/2013   Cardiac Cath: sharp Angle take-off of Large Dominant Cx: ~70-80% mid Cx bifurcation lesion (Lateral OM &  AVGCx-PL-PDA both with hairpin ostial & ~50% lesions) - Not PCI amenable due to vessel tortuosity; ~40-50% mid LAD;Ramus - no significnat diseaes; small non-dominant RCA   LEFT HEART CATHETERIZATION WITH CORONARY  ANGIOGRAM N/A 06/16/2013   Procedure: LEFT HEART CATHETERIZATION WITH CORONARY ANGIOGRAM;  Surgeon: Marykay Lex, MD;  Location: [facility] CATH LAB;  Service: Cardiovascular;  Laterality: N/A;   TRANSTHORACIC ECHOCARDIOGRAM  06/17/2013   Normal LV size and function. EF 55-60% with no regional WMA. Grade 1 diastolic dysfunction. No significant valvular lesions   TUBAL LIGATION     Patient Active Problem List   Diagnosis Date Noted   Excessive body weight loss 11/19/2022   Acute metabolic encephalopathy 07/07/2022   Acute encephalopathy 07/06/2022   Nausea vomiting and diarrhea 07/06/2022   Altered mental status 07/05/2022   Falls 05/01/2022   Aortic stenosis, mild 04/20/2022   Pulmonary embolism (HCC) 04/18/2022   Cholelithiasis 04/18/2022   Wheezing 04/18/2022   Acute respiratory failure with hypoxia (HCC) 04/18/2022   Age-related osteoporosis with current pathological fracture, vertebra(e), initial encounter for fracture (HCC) 04/04/2022   Inflammation of sacroiliac joint (HCC) 04/04/2022   Polyneuropathy 04/04/2022   Subacute cough 03/28/2022   Pedal edema 11/04/2020   Scoliosis of lumbar spine 08/04/2020   Lumbar spondylosis 08/04/2020   Radiculopathy, lumbar region 05/19/2020   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) 07/01/2013   Hypothyroid 06/16/2013   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   Unstable angina - history of 06/13/2013   Hyperlipidemia associated with type 2 diabetes mellitus (HCC)  GERD (gastroesophageal reflux disease)    Essential hypertension    Type 2 diabetes mellitus treated with insulin (HCC)    Dyspnea    Diverticulosis    HOARSENESS 06/30/2009   OSA treated with BiPAP 01/01/2007   WEIGHT GAIN 01/01/2007    PCP:  Donita Brooks, MD  REFERRING PROVIDER: Ocie Doyne, MD   REFERRING DIAG: cervicalgia  THERAPY DIAG:  Cervicalgia  Nonintractable headache, unspecified chronicity pattern, unspecified  headache type  Neuralgia and neuritis  Rationale for Evaluation and Treatment: Rehabilitation  ONSET DATE: Christmas season 2023.   PERTINENT HISTORY:  Patient is a 82 y.o. female who presents to outpatient physical therapy with a referral for medical diagnosis cervicalgia. This patient's chief complaints consist of left sided neck pain and stiffness and headache that is worst near the mastoid process and intermittently radiates to the top of her head, temple, and behind left eye, and left UE pain leading to the following functional deficits: difficulty feeling like "doing much of nothing," watching TV, being with family, crosswords, play solitaire on her phone, turning her head, flexing or extending neck, prolonged sitting, traveling. 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 She  has a past medical history of Abnormal weight gain, Acute diverticulitis (05/13/2021), Arthritis, COPD (chronic obstructive pulmonary disease) (HCC), Coronary artery disease, non-occlusive (06/13/2013),  Diverticulosis, GERD (gastroesophageal reflux disease), History of stress test, Hyperlipidemia, Hypertension, NIDDM (non-insulin dependent diabetes mellitus), OSA (obstructive sleep apnea), and Tracheobronchomalacia. She  has a past surgical history that includes Tubal ligation; Cardiac catheterization (06/2013); transthoracic echocardiogram (06/17/2013); and left heart catheterization with coronary angiogram (N/A, 06/16/2013).  Patient/DIL denies hx of cancer, stroke, seizures, unexplained weight loss, unexplained changes in bowel or bladder problems, unexplained stumbling or dropping things, and spinal surgery.  SUBJECTIVE:  SUBJECTIVE STATEMENT: Patient arrives with her son BJ and using SPC. She states she had pneumonia last week and has just been starting to feel better the last couple of days. She states she wasn't able to keep doing all of her HEP while she was sick and has not returned to her exercises yet. She states her neck/head pain was getting better before she had pneumonia but it got worse again since being sick. She denies falls. She is still taking medication for pneumonia.   PAIN:  NPRS: 6/10 behind left ear radiating to the top of the left side of her head.  PRECAUTIONS: Fall  PATIENT GOALS: "want to get her head to quit hurting and would like to get my back to quit  hurting"   NEXT MD VISIT: 6 months from the last visit  OBJECTIVE  TODAY'S TREATMENT: Therapeutic exercise: to centralize symptoms and improve ROM, strength, muscular endurance, and activity tolerance required for successful completion of functional activities.  - NuStep level 1 using bilateral upper and lower extremities. Seat/handle setting 7/8. For improved extremity mobility, muscular endurance, and activity tolerance;  and to induce the analgesic effect of aerobic exercise, stimulate improved joint nutrition, and prepare body structures and systems for following interventions. x 5  minutes. Average SPM = 64. Cued to keep SPM above 60.  (Manual therapy / dry needling - see below) - prone cervical spine retraction, 1x10, 5 second holds - prone scapular retractoin, 1x10, 5 second holds - prone cervical and scapular retractoin, 1x10, 5 second holds - Sidelying open book (thoracic rotation) to improve thoracic, shoulder girdle, and upper trunk mobility. 1x10 each side.  - hooklying chin tuck with lift, 10x10 seconds. Min cuing for chin position, and need for longer rests due to form fatigue.  - seated cervical spine rotation SNAG with pillow case, 1x10 each direction with 5 second holds.  - various bed mobility maneuvers with supervision and increased effort.   Manual therapy: to reduce pain and tissue tension, improve range of motion, neuromodulation, in order to promote improved ability to complete functional activities. PRONE - STM to B suboccipital and cervical paraspinal muscles and upper traps  Modality: Dry needling performed to posterior cervical spine to decrease pain and spasms along patient's neck and region with patient in prone utilizing 2 dry needle(s) .25mm x 30mm with 1 stick each side at suboccipital muscles, 1 stick each side mid cervical multifidi, and 1 stick each side at upper traps. Patient educated about the risks and benefits from therapy and verbally consents to treatment.  Dry needling performed by Luretha Murphy. Ilsa Iha PT, DPT who is certified in this technique  Pt required multimodal cuing for proper technique and to facilitate improved neuromuscular control, strength, range of motion, and functional ability resulting in improved performance and form.  PATIENT EDUCATION:  Education details: Exercise purpose/form. Self management techniques. Education on diagnosis, prognosis, POC, anatomy and  physiology of current condition. Education on HEP including handout. Person educated: Patient and Caregiver DIL [name] Education method: Explanation, Demonstration, Tactile cues, Verbal cues, and Handouts Education comprehension: verbalized understanding, returned demonstration, and needs further education  HOME EXERCISE PROGRAM: Access Code: [email or web address] URL: [email or web address] Date: 11/02/2022 Prepared by: Norton Blizzard  Exercises - Supine Deep Neck Flexor Nods  - 1-2 x daily - 1 sets - 10 reps - 10 seconds hold - Sidelying Thoracic Rotation with Open Book  - 1 x daily - 1-2 sets - 10 reps - 5 seconds hold - Seated Cervical Retraction  -  1-2 x daily - 3 sets - 10 reps - 1 seconds hold - Seated Shoulder Row with Anchored Resistance  - 1 x daily - 3 sets - 15 reps  HOME EXERCISE PROGRAM [[email or web address]] View at [email or web address]" using code: [email or web address] Suboccipital Stretch -  Repeat 3 Repetitions, Hold 1 Minute, Complete 1 Set, Perform 1 Times a Day  ASSESSMENT:  CLINICAL IMPRESSION: Patient arrives with her son BJ, who participated throughout the session. Patient returns to PT after missing last week due to being sick with pneumonia. Session focused on dry needling for pain relief followed by postural strengthening and ROM exercises. Patient reported feeling a bit better by end of session and demonstrated improved ability to perform bed mobility and exercises. She continues to require significant cuing for optimal and effective performance of exercises. Patient would benefit from continued management of limiting condition by skilled physical therapist to address remaining impairments and functional limitations to work towards stated goals and return to PLOF or maximal functional independence.   From Initial PT Evaluation 10/25/2022:  Patient is a 82 y.o. female referred to outpatient physical therapy with a medical diagnosis of cervicalgia who presents with signs and symptoms  consistent with left sided neck pain with radiation to the left side of the head reproducing patient's headache complaint. Patient also has limited AROM especially into left rotation that is associated with the neck pain and headache and left arm discomfort possibly radiating from left sided neck. Patient presents with significant posture, joint stiffness, ROM, motor control, muscle tension, muscle performance (strength/power/endurance) and activity tolerance impairments that are limiting ability to complete usual activities such as watching TV, being with family, crosswords, play solitaire on her phone, turning her head, flexing or extending neck, prolonged sitting, traveling without difficulty and making her feel like "doing much of nothing." Patient will benefit from skilled physical therapy intervention to address current body structure impairments and activity limitations to improve function and work towards goals set in current POC in order to return to prior level of function or maximal functional improvement.    OBJECTIVE IMPAIRMENTS: Abnormal gait, decreased activity tolerance, decreased cognition, decreased coordination, decreased endurance, decreased knowledge of condition, decreased mobility, difficulty walking, decreased ROM, decreased strength, hypomobility, impaired perceived functional ability, increased muscle spasms, impaired flexibility, impaired UE functional use, improper body mechanics, postural dysfunction, and pain.   ACTIVITY LIMITATIONS: carrying, lifting, sitting, sleeping, transfers, bed mobility, bathing, dressing, hygiene/grooming, and locomotion level  PARTICIPATION LIMITATIONS: meal prep, cleaning, interpersonal relationship, shopping, community activity, and   difficulty feeling like "doing much of nothing," watching TV, being with family, crosswords, play solitaire on her phone, turning her head, flexing or extending neck, prolonged sitting, traveling  PERSONAL FACTORS:  Age, Fitness, Past/current experiences, Time since onset of injury/illness/exacerbation, and 3+ comorbidities:   OSA treated with BiPAP; WEIGHT GAIN; HOARSENESS; Hyperlipidemia associated with type 2 diabetes mellitus (HCC); GERD (gastroesophageal reflux disease); Essential hypertension; Type 2 diabetes mellitus treated with insulin (HCC); Dyspnea; Diverticulosis; Unstable angina - history of; Hypothyroid; Tracheobronchomalacia; 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 She  has a past medical history of Abnormal weight gain, Acute  diverticulitis (05/13/2021), Arthritis, COPD (chronic obstructive pulmonary disease) (HCC), Coronary artery disease, non-occlusive (06/13/2013), Diverticulosis, GERD (gastroesophageal reflux disease), History of stress test, Hyperlipidemia, Hypertension, NIDDM (non-insulin dependent diabetes mellitus), OSA (obstructive sleep apnea), and Tracheobronchomalacia. She  has a past surgical history that includes Tubal ligation; Cardiac catheterization (06/2013); transthoracic echocardiogram (06/17/2013); and left heart catheterization with coronary angiogram (N/A, 06/16/2013) are also affecting patient's functional outcome.   REHAB POTENTIAL: Good  CLINICAL DECISION MAKING: Evolving/moderate complexity  EVALUATION COMPLEXITY: Moderate   GOALS: Goals reviewed with patient? No  SHORT TERM GOALS: Target date: 11/08/2022  Patient will be  independent with initial home exercise program for self-management of symptoms. Baseline: Initial HEP provided at IE (10/25/22); Goal status: In-progress   LONG TERM GOALS: Target date: 01/17/2023  Patient will be independent with a long-term home exercise program for self-management of symptoms.  Baseline: Initial HEP provided at IE (10/25/22); Goal status: In-progress  2.  Patient will demonstrate improved FOTO by equal or greater than 10 points by visit #10 to demonstrate improvement in overall condition and self-reported functional ability.  Baseline: to be measured visit 2 as appropriate (10/25/22); Goal status: In-progress  3.  Patient will improve cervical spine rotation to equal or greater than 60 degrees each direction without increased pain to improve ability to view her surroundings without difficulty.  Baseline: R/L 67/50 with concordant pain (10/25/22); Goal status: In-progress  4.  Patient will score equal or greater than 26 seconds on the Deep Neck Flexor Endurance Test to demonstrate improved neck strength/endurance and reach age matched norm in healthy women to improve her ability to complete her daily activities with less difficulty due to neck pain associated with fatigue.  Baseline: to be tested visit 2 as appropriate (10/25/22); Goal status: In-progress  5.  Patient will complete community, work and/or recreational activities with 50% less limitation due to current condition.  Baseline: difficulty feeling like "doing much of nothing," watching TV, being with family, crosswords, play solitaire on her phone, turning her head, flexing or extending neck, prolonged sitting, traveling (10/25/22); Goal status: In-progress   PLAN:  PT FREQUENCY: 1-2x/week  PT DURATION: 12 weeks  PLANNED INTERVENTIONS: Therapeutic exercises, Therapeutic activity, Neuromuscular re-education, Patient/Family education, Self Care, DME instructions, Dry Needling, Electrical stimulation,  Cryotherapy, Moist heat, Manual therapy, and Re-evaluation  PLAN FOR NEXT SESSION: update HEP as appropriate, progressive postural/UE/functional strengthening, motor control, and ROM exercises as appropriate. Education. Manual therapy as needed.    Cira Rue, PT, DPT 11/28/2022, 12:08 PM  [facility] [address] [place], [place] [address] P: [phone] I F: [phone]

## 2022-11-30 ENCOUNTER — Ambulatory Visit: Payer: Medicare Other | Admitting: Physical Therapy

## 2022-11-30 ENCOUNTER — Encounter: Payer: Self-pay | Admitting: Physical Therapy

## 2022-11-30 DIAGNOSIS — M542 Cervicalgia: Secondary | ICD-10-CM

## 2022-11-30 DIAGNOSIS — R519 Headache, unspecified: Secondary | ICD-10-CM | POA: Diagnosis not present

## 2022-11-30 DIAGNOSIS — M792 Neuralgia and neuritis, unspecified: Secondary | ICD-10-CM | POA: Diagnosis not present

## 2022-11-30 NOTE — Therapy (Signed)
OUTPATIENT PHYSICAL THERAPY TREATMENT   Patient Name: April [name] MRN: [id number] DOB:1941-01-22, 82 y.o., female Today's Date: 11/30/2022  END OF SESSION:  PT End of Session - 11/30/22 1427     Visit Number 8    Number of Visits 13    Date for PT Re-Evaluation 01/17/23    Authorization Type [employer] MEDICARE reporting period from 10/25/2022    Progress Note Due on Visit 10    PT Start Time 1350    PT Stop Time 1430    PT Time Calculation (min) 40 min    Activity Tolerance Patient tolerated treatment well    Behavior During Therapy [facility] for tasks assessed/performed              Past Medical History:  Diagnosis Date   Abnormal weight gain    Acute diverticulitis 05/13/2021   Arthritis    "joints" (06/13/2013)   COPD (chronic obstructive pulmonary disease) (HCC)    Coronary artery disease, non-occlusive 06/13/2013   Cardiac Cath: sharp Angle take-off of Large Dominant Cx: ~70-80% mid Cx bifurcation lesion (Lateral OM &  AVGCx-PL-PDA both with hairpin ostial & ~50% lesions) - Not PCI amenable due to vessel tortuosity; ~40-50% mid LAD;Ramus - no significnat diseaes; small non-dominant RCA   Diverticulosis    GERD (gastroesophageal reflux disease)    History of stress test    a. 09/2006 nl Dobutamine Echo   Hyperlipidemia    Hypertension    NIDDM (non-insulin dependent diabetes mellitus)    On home oxygen therapy    "2L q hs; runs into my BIPAP" (06/13/2013)   OSA (obstructive sleep apnea)    "BIPAP w/O2" (06/13/2013)   Tracheobronchomalacia    a. followed by [facility]   Past Surgical History:  Procedure Laterality Date   CARDIAC CATHETERIZATION  06/2013   Cardiac Cath: sharp Angle take-off of Large Dominant Cx: ~70-80% mid Cx bifurcation lesion (Lateral OM &  AVGCx-PL-PDA both with hairpin ostial & ~50% lesions) - Not PCI amenable due to vessel tortuosity; ~40-50% mid LAD;Ramus - no significnat diseaes; small non-dominant RCA   LEFT HEART CATHETERIZATION WITH CORONARY  ANGIOGRAM N/A 06/16/2013   Procedure: LEFT HEART CATHETERIZATION WITH CORONARY ANGIOGRAM;  Surgeon: Marykay Lex, MD;  Location: [facility] CATH LAB;  Service: Cardiovascular;  Laterality: N/A;   TRANSTHORACIC ECHOCARDIOGRAM  06/17/2013   Normal LV size and function. EF 55-60% with no regional WMA. Grade 1 diastolic dysfunction. No significant valvular lesions   TUBAL LIGATION     Patient Active Problem List   Diagnosis Date Noted   Excessive body weight loss 11/19/2022   Acute metabolic encephalopathy 07/07/2022   Acute encephalopathy 07/06/2022   Nausea vomiting and diarrhea 07/06/2022   Altered mental status 07/05/2022   Falls 05/01/2022   Aortic stenosis, mild 04/20/2022   Pulmonary embolism (HCC) 04/18/2022   Cholelithiasis 04/18/2022   Wheezing 04/18/2022   Acute respiratory failure with hypoxia (HCC) 04/18/2022   Age-related osteoporosis with current pathological fracture, vertebra(e), initial encounter for fracture (HCC) 04/04/2022   Inflammation of sacroiliac joint (HCC) 04/04/2022   Polyneuropathy 04/04/2022   Subacute cough 03/28/2022   Pedal edema 11/04/2020   Scoliosis of lumbar spine 08/04/2020   Lumbar spondylosis 08/04/2020   Radiculopathy, lumbar region 05/19/2020   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) 07/01/2013   Hypothyroid 06/16/2013   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   Unstable angina - history of 06/13/2013   Hyperlipidemia associated with type 2 diabetes mellitus (HCC)  GERD (gastroesophageal reflux disease)    Essential hypertension    Type 2 diabetes mellitus treated with insulin (HCC)    Dyspnea    Diverticulosis    HOARSENESS 06/30/2009   OSA treated with BiPAP 01/01/2007   WEIGHT GAIN 01/01/2007    PCP:  Donita Brooks, MD  REFERRING PROVIDER: Ocie Doyne, MD   REFERRING DIAG: cervicalgia  THERAPY DIAG:  Cervicalgia  Nonintractable headache, unspecified chronicity pattern, unspecified  headache type  Neuralgia and neuritis  Rationale for Evaluation and Treatment: Rehabilitation  ONSET DATE: Christmas season 2023.   PERTINENT HISTORY:  Patient is a 82 y.o. female who presents to outpatient physical therapy with a referral for medical diagnosis cervicalgia. This patient's chief complaints consist of left sided neck pain and stiffness and headache that is worst near the mastoid process and intermittently radiates to the top of her head, temple, and behind left eye, and left UE pain leading to the following functional deficits: difficulty feeling like "doing much of nothing," watching TV, being with family, crosswords, play solitaire on her phone, turning her head, flexing or extending neck, prolonged sitting, traveling. 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 She  has a past medical history of Abnormal weight gain, Acute diverticulitis (05/13/2021), Arthritis, COPD (chronic obstructive pulmonary disease) (HCC), Coronary artery disease, non-occlusive (06/13/2013),  Diverticulosis, GERD (gastroesophageal reflux disease), History of stress test, Hyperlipidemia, Hypertension, NIDDM (non-insulin dependent diabetes mellitus), OSA (obstructive sleep apnea), and Tracheobronchomalacia. She  has a past surgical history that includes Tubal ligation; Cardiac catheterization (06/2013); transthoracic echocardiogram (06/17/2013); and left heart catheterization with coronary angiogram (N/A, 06/16/2013).  Patient/DIL denies hx of cancer, stroke, seizures, unexplained weight loss, unexplained changes in bowel or bladder problems, unexplained stumbling or dropping things, and spinal surgery.  SUBJECTIVE:  SUBJECTIVE STATEMENT: Patient arrives with her DIL [name] and using SPC. Patient states her head and neck are still hurting and today the pain is going behind her left eye. [name] states Ocie Doyne, MD left [facility] and she is scheduled to see Ihor Austin, NP there in March now.   PAIN:  NPRS: 6/10 behind left ear radiating to the top of the left side of her head and behind her left eye.  PRECAUTIONS: Fall  PATIENT GOALS: "want to get her head to quit hurting and would like to get my back to quit  hurting"   NEXT MD VISIT: 6 months from the last visit  OBJECTIVE  TODAY'S TREATMENT: Therapeutic exercise: to centralize symptoms and improve ROM, strength, muscular endurance, and activity tolerance required for successful completion of functional activities.  - NuStep level 1 using bilateral upper and lower extremities. Seat/handle setting 7/8. For improved extremity mobility, muscular endurance, and activity tolerance; and to induce the analgesic effect of aerobic exercise, stimulate improved joint nutrition, and prepare body structures and systems for  following interventions. x 5  minutes. Average SPM = 64. Cued to keep SPM above 60.  (Manual therapy / dry needling - see below) - seated cervical spine rotation SNAG with pillow case, 1x10 each direction with 5 second holds.  - various bed mobility maneuvers with supervision and increased effort.   Manual therapy: to reduce pain and tissue tension, improve range of motion, neuromodulation, in order to promote improved ability to complete functional activities. PRONE - STM to B suboccipital and cervical paraspinal muscles and L upper trap HOOKLYING - STM to B suboccipital (including suboccipital release) and cervical paraspinal muscles and upper traps - contract-relax PROM of the suboccipital region, 6 reps of 6/6 second contract/relax cycles.  - PROM upper trap stretch, 3x30 seconds for left upper trap, 1x30 seconds right upper trap - upper cervical spine mobilization, grade II-III, targeting left side, 2-3x30 seconds  Modality: Dry needling performed to posterior cervical spine to decrease pain and spasms along patient's neck and region with patient in prone utilizing 2 dry needle(s) .25mm x 30mm with 3 stick at left suboccipital muscles, and 1 stick at right suboccipital muscles, 1 stick at left splenius capitus/cervicis, and 2 sticks at left upper trap. Patient educated about the risks and benefits from therapy and verbally consents to treatment.  Dry needling performed by Luretha Murphy. Ilsa Iha PT, DPT who is certified in this technique  Pt required multimodal cuing for proper technique and to facilitate improved neuromuscular control, strength, range of motion, and functional ability resulting in improved performance and form.  PATIENT EDUCATION:  Education details: Exercise purpose/form. Self management techniques. Education on diagnosis, prognosis, POC, anatomy and physiology of current condition. Education on HEP including handout. Person educated: Patient and Caregiver DIL [name] Education  method: Explanation, Demonstration, Tactile cues, Verbal cues, and Handouts Education comprehension: verbalized understanding, returned demonstration, and needs further education  HOME EXERCISE PROGRAM: Access Code: [email or web address] URL: [email or web address] Date: 11/30/2022 Prepared by: Norton Blizzard  Exercises - Supine Deep Neck Flexor Nods  - 1-2 x daily - 1 sets - 10 reps - 10 seconds hold - Sidelying Thoracic Rotation with Open Book  - 1 x daily - 1-2 sets - 10 reps - 5 seconds hold - Seated Cervical Retraction  - 1-2 x daily - 3 sets - 10 reps - 1 seconds hold - Seated Shoulder Row with Anchored Resistance  - 1 x daily - 3 sets - 15 reps -  Seated Assisted Cervical Rotation with Towel  - 1 x daily - 1-2 sets - 10 reps - 5 seconds hold  HOME EXERCISE PROGRAM [[email or web address]] View at [email or web address]" using code: [email or web address] Suboccipital Stretch -  Repeat 3 Repetitions, Hold 1 Minute, Complete 1 Set, Perform 1 Times a Day  ASSESSMENT:  CLINICAL IMPRESSION: Patient arrives with her DIL [name], who participated throughout the session. Patient did not seem to get substantial relief from last PT session. Spent more time this session on manual interventions to help loosen up the muscles in the suboccipital region and left upper traps that are likely contributing to patient's symptoms. Patient had concordant pain radiating to the left head and behind the eye with needling to left suboccipitals and left upper trap. Strong twitch response noted at left upper trap. She reported feeling better by end of session. She is also showing improved carry over for SNAG exercises. Patient would benefit from continued management of limiting condition by skilled physical therapist to address remaining impairments and functional limitations to work towards stated goals and return to PLOF or maximal functional independence.   From Initial PT Evaluation 10/25/2022:  Patient is a 82 y.o. female referred to  outpatient physical therapy with a medical diagnosis of cervicalgia who presents with signs and symptoms consistent with left sided neck pain with radiation to the left side of the head reproducing patient's headache complaint. Patient also has limited AROM especially into left rotation that is associated with the neck pain and headache and left arm discomfort possibly radiating from left sided neck. Patient presents with significant posture, joint stiffness, ROM, motor control, muscle tension, muscle performance (strength/power/endurance) and activity tolerance impairments that are limiting ability to complete usual activities such as watching TV, being with family, crosswords, play solitaire on her phone, turning her head, flexing or extending neck, prolonged sitting, traveling without difficulty and making her feel like "doing much of nothing." Patient will benefit from skilled physical therapy intervention to address current body structure impairments and activity limitations to improve function and work towards goals set in current POC in order to return to prior level of function or maximal functional improvement.    OBJECTIVE IMPAIRMENTS: Abnormal gait, decreased activity tolerance, decreased cognition, decreased coordination, decreased endurance, decreased knowledge of condition, decreased mobility, difficulty walking, decreased ROM, decreased strength, hypomobility, impaired perceived functional ability, increased muscle spasms, impaired flexibility, impaired UE functional use, improper body mechanics, postural dysfunction, and pain.   ACTIVITY LIMITATIONS: carrying, lifting, sitting, sleeping, transfers, bed mobility, bathing, dressing, hygiene/grooming, and locomotion level  PARTICIPATION LIMITATIONS: meal prep, cleaning, interpersonal relationship, shopping, community activity, and   difficulty feeling like "doing much of nothing," watching TV, being with family, crosswords, play solitaire on her  phone, turning her head, flexing or extending neck, prolonged sitting, traveling  PERSONAL FACTORS: Age, Fitness, Past/current experiences, Time since onset of injury/illness/exacerbation, and 3+ comorbidities:   OSA treated with BiPAP; WEIGHT GAIN; HOARSENESS; Hyperlipidemia associated with type 2 diabetes mellitus (HCC); GERD (gastroesophageal reflux disease); Essential hypertension; Type 2 diabetes mellitus treated with insulin (HCC); Dyspnea; Diverticulosis; Unstable angina - history of; Hypothyroid; Tracheobronchomalacia; 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); Pedal edema; Scoliosis of lumbar spine; Lumbar spondylosis; Radiculopathy, lumbar region; Subacute cough; Age-related osteoporosis with current pathological fracture, vertebra(e), initial encounter for fracture (HCC); Inflammation of sacroiliac joint (HCC); Polyneuropathy; Acute pulmonary embolism (HCC); Cholelithiasis; Wheezing; Acute respiratory failure with hypoxia (HCC); Falls; Altered mental status; Acute encephalopathy; Nausea vomiting and diarrhea; and Acute metabolic  encephalopathy on their problem list. She  has a past medical history of Abnormal weight gain, Acute diverticulitis (05/13/2021), Arthritis, COPD (chronic obstructive pulmonary disease) (HCC), Coronary artery disease, non-occlusive (06/13/2013), Diverticulosis, GERD (gastroesophageal reflux disease), History of stress test, Hyperlipidemia, Hypertension, NIDDM (non-insulin dependent diabetes mellitus), OSA (obstructive sleep apnea), and Tracheobronchomalacia. She  has a past surgical history that includes Tubal ligation; Cardiac catheterization (06/2013); transthoracic echocardiogram (06/17/2013); and left heart catheterization with coronary angiogram (N/A, 06/16/2013) are also affecting patient's functional outcome.   REHAB POTENTIAL: Good  CLINICAL DECISION MAKING: Evolving/moderate complexity  EVALUATION COMPLEXITY:  Moderate   GOALS: Goals reviewed with patient? No  SHORT TERM GOALS: Target date: 11/08/2022  Patient will be independent with initial home exercise program for self-management of symptoms. Baseline: Initial HEP provided at IE (10/25/22); Goal status: In-progress   LONG TERM GOALS: Target date: 01/17/2023  Patient will be independent with a long-term home exercise program for self-management of symptoms.  Baseline: Initial HEP provided at IE (10/25/22); Goal status: In-progress  2.  Patient will demonstrate improved FOTO by equal or greater than 10 points by visit #10 to demonstrate improvement in overall condition and self-reported functional ability.  Baseline: to be measured visit 2 as appropriate (10/25/22); Goal status: In-progress  3.  Patient will improve cervical spine rotation to equal or greater than 60 degrees each direction without increased pain to improve ability to view her surroundings without difficulty.  Baseline: R/L 67/50 with concordant pain (10/25/22); Goal status: In-progress  4.  Patient will score equal or greater than 26 seconds on the Deep Neck Flexor Endurance Test to demonstrate improved neck strength/endurance and reach age matched norm in healthy women to improve her ability to complete her daily activities with less difficulty due to neck pain associated with fatigue.  Baseline: to be tested visit 2 as appropriate (10/25/22); Goal status: In-progress  5.  Patient will complete community, work and/or recreational activities with 50% less limitation due to current condition.  Baseline: difficulty feeling like "doing much of nothing," watching TV, being with family, crosswords, play solitaire on her phone, turning her head, flexing or extending neck, prolonged sitting, traveling (10/25/22); Goal status: In-progress   PLAN:  PT FREQUENCY: 1-2x/week  PT DURATION: 12 weeks  PLANNED INTERVENTIONS: Therapeutic exercises, Therapeutic activity,  Neuromuscular re-education, Patient/Family education, Self Care, DME instructions, Dry Needling, Electrical stimulation, Cryotherapy, Moist heat, Manual therapy, and Re-evaluation  PLAN FOR NEXT SESSION: update HEP as appropriate, progressive postural/UE/functional strengthening, motor control, and ROM exercises as appropriate. Education. Manual therapy as needed.    Cira Rue, PT, DPT 11/30/2022, 6:03 PM  [facility] [address] [place], [place] [address] P: [phone] I F: [phone]

## 2022-12-04 ENCOUNTER — Encounter: Payer: Self-pay | Admitting: Physical Therapy

## 2022-12-04 ENCOUNTER — Ambulatory Visit: Payer: Medicare Other | Admitting: Physical Therapy

## 2022-12-04 DIAGNOSIS — M792 Neuralgia and neuritis, unspecified: Secondary | ICD-10-CM

## 2022-12-04 DIAGNOSIS — R519 Headache, unspecified: Secondary | ICD-10-CM | POA: Diagnosis not present

## 2022-12-04 DIAGNOSIS — M542 Cervicalgia: Secondary | ICD-10-CM | POA: Diagnosis not present

## 2022-12-04 NOTE — Therapy (Signed)
OUTPATIENT PHYSICAL THERAPY TREATMENT   Patient Name: April [name] MRN: [id number] DOB:1940/02/18, 82 y.o., female Today's Date: 12/04/2022  END OF SESSION:  PT End of Session - 12/04/22 0946     Visit Number 9    Number of Visits 13    Date for PT Re-Evaluation 01/17/23    Authorization Type [employer] MEDICARE reporting period from 10/25/2022    Progress Note Due on Visit 10    PT Start Time 0947    PT Stop Time 1028    PT Time Calculation (min) 41 min    Activity Tolerance Patient tolerated treatment well    Behavior During Therapy [facility] for tasks assessed/performed               Past Medical History:  Diagnosis Date   Abnormal weight gain    Acute diverticulitis 05/13/2021   Arthritis    "joints" (06/13/2013)   COPD (chronic obstructive pulmonary disease) (HCC)    Coronary artery disease, non-occlusive 06/13/2013   Cardiac Cath: sharp Angle take-off of Large Dominant Cx: ~70-80% mid Cx bifurcation lesion (Lateral OM &  AVGCx-PL-PDA both with hairpin ostial & ~50% lesions) - Not PCI amenable due to vessel tortuosity; ~40-50% mid LAD;Ramus - no significnat diseaes; small non-dominant RCA   Diverticulosis    GERD (gastroesophageal reflux disease)    History of stress test    a. 09/2006 nl Dobutamine Echo   Hyperlipidemia    Hypertension    NIDDM (non-insulin dependent diabetes mellitus)    On home oxygen therapy    "2L q hs; runs into my BIPAP" (06/13/2013)   OSA (obstructive sleep apnea)    "BIPAP w/O2" (06/13/2013)   Tracheobronchomalacia    a. followed by [facility]   Past Surgical History:  Procedure Laterality Date   CARDIAC CATHETERIZATION  06/2013   Cardiac Cath: sharp Angle take-off of Large Dominant Cx: ~70-80% mid Cx bifurcation lesion (Lateral OM &  AVGCx-PL-PDA both with hairpin ostial & ~50% lesions) - Not PCI amenable due to vessel tortuosity; ~40-50% mid LAD;Ramus - no significnat diseaes; small non-dominant RCA   LEFT HEART CATHETERIZATION WITH CORONARY  ANGIOGRAM N/A 06/16/2013   Procedure: LEFT HEART CATHETERIZATION WITH CORONARY ANGIOGRAM;  Surgeon: Marykay Lex, MD;  Location: [facility] CATH LAB;  Service: Cardiovascular;  Laterality: N/A;   TRANSTHORACIC ECHOCARDIOGRAM  06/17/2013   Normal LV size and function. EF 55-60% with no regional WMA. Grade 1 diastolic dysfunction. No significant valvular lesions   TUBAL LIGATION     Patient Active Problem List   Diagnosis Date Noted   Excessive body weight loss 11/19/2022   Acute metabolic encephalopathy 07/07/2022   Acute encephalopathy 07/06/2022   Nausea vomiting and diarrhea 07/06/2022   Altered mental status 07/05/2022   Falls 05/01/2022   Aortic stenosis, mild 04/20/2022   Pulmonary embolism (HCC) 04/18/2022   Cholelithiasis 04/18/2022   Wheezing 04/18/2022   Acute respiratory failure with hypoxia (HCC) 04/18/2022   Age-related osteoporosis with current pathological fracture, vertebra(e), initial encounter for fracture (HCC) 04/04/2022   Inflammation of sacroiliac joint (HCC) 04/04/2022   Polyneuropathy 04/04/2022   Subacute cough 03/28/2022   Pedal edema 11/04/2020   Scoliosis of lumbar spine 08/04/2020   Lumbar spondylosis 08/04/2020   Radiculopathy, lumbar region 05/19/2020   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) 07/01/2013   Hypothyroid 06/16/2013   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   Unstable angina - history of 06/13/2013   Hyperlipidemia associated with type 2 diabetes mellitus (HCC)  GERD (gastroesophageal reflux disease)    Essential hypertension    Type 2 diabetes mellitus treated with insulin (HCC)    Dyspnea    Diverticulosis    HOARSENESS 06/30/2009   OSA treated with BiPAP 01/01/2007   WEIGHT GAIN 01/01/2007    PCP:  Donita Brooks, MD  REFERRING PROVIDER: Ocie Doyne, MD   REFERRING DIAG: cervicalgia  THERAPY DIAG:  Cervicalgia  Nonintractable headache, unspecified chronicity pattern, unspecified  headache type  Neuralgia and neuritis  Rationale for Evaluation and Treatment: Rehabilitation  ONSET DATE: Christmas season 2023.   PERTINENT HISTORY:  Patient is a 82 y.o. female who presents to outpatient physical therapy with a referral for medical diagnosis cervicalgia. This patient's chief complaints consist of left sided neck pain and stiffness and headache that is worst near the mastoid process and intermittently radiates to the top of her head, temple, and behind left eye, and left UE pain leading to the following functional deficits: difficulty feeling like "doing much of nothing," watching TV, being with family, crosswords, play solitaire on her phone, turning her head, flexing or extending neck, prolonged sitting, traveling. 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 She  has a past medical history of Abnormal weight gain, Acute diverticulitis (05/13/2021), Arthritis, COPD (chronic obstructive pulmonary disease) (HCC), Coronary artery disease, non-occlusive (06/13/2013),  Diverticulosis, GERD (gastroesophageal reflux disease), History of stress test, Hyperlipidemia, Hypertension, NIDDM (non-insulin dependent diabetes mellitus), OSA (obstructive sleep apnea), and Tracheobronchomalacia. She  has a past surgical history that includes Tubal ligation; Cardiac catheterization (06/2013); transthoracic echocardiogram (06/17/2013); and left heart catheterization with coronary angiogram (N/A, 06/16/2013).  Patient/DIL denies hx of cancer, stroke, seizures, unexplained weight loss, unexplained changes in bowel or bladder problems, unexplained stumbling or dropping things, and spinal surgery.  SUBJECTIVE:  SUBJECTIVE STATEMENT: Patient arrives with her son BJ and using SPC. She states her head has been continuing to hurt and feels worse today. She states she felt better for the rest of the day after last PT session. She did her HEP yesterday and today but did not "do much of anything" on the days before that. She states she has been feeling extra tired since she had pneumonia. She finished her medication for pneumonia yesterday.   PAIN:  NPRS: 6/10 behind left ear radiating to the top of the left side of her head.   PRECAUTIONS: Fall  PATIENT GOALS: "want to get her head to quit hurting and would like to get my back to quit  hurting"   NEXT MD VISIT: 6 months from the last visit  OBJECTIVE  TODAY'S TREATMENT: Therapeutic exercise: to centralize symptoms and improve ROM, strength, muscular endurance, and activity tolerance required for successful completion of functional activities.  - NuStep level 2 using bilateral upper and lower extremities. Seat/handle setting 7/9. For improved extremity mobility, muscular endurance, and activity tolerance; and to induce the analgesic effect of  aerobic exercise, stimulate improved joint nutrition, and prepare body structures and systems for following interventions. x 5  minutes. Average SPM = 61. Cued to keep SPM above 60.  (Manual therapy / dry needling - see below) - prone cervical + scapular retraction, 3x10 with 5 second holds, cuing for improved position and breathing.  - seated cervical spine rotation SNAG with pillow case, 1x10 each direction with 5 second holds. Cuing for turning the head.  - various bed mobility maneuvers with supervision and increased effort.   Manual therapy: to reduce pain and tissue tension, improve range of motion, neuromodulation, in order to promote improved ability to complete functional activities. PRONE - STM to B suboccipital and cervical paraspinal muscles and L upper trap  Modality: Dry needling performed to posterior cervical spine to decrease pain and spasms along patient's neck and region with patient in prone utilizing 2 dry needle(s) .25mm x 30mm with 1 stick at each side at suboccipital muscles, and 3 sticks at left upper trap. Patient educated about the risks and benefits from therapy and verbally consents to treatment.  Dry needling performed by Luretha Murphy. Ilsa Iha PT, DPT who is certified in this technique  Pt required multimodal cuing for proper technique and to facilitate improved neuromuscular control, strength, range of motion, and functional ability resulting in improved performance and form.  PATIENT EDUCATION:  Education details: Exercise purpose/form. Self management techniques. Education on diagnosis, prognosis, POC, anatomy and physiology of current condition. Education on HEP including handout. Person educated: Patient and Caregiver DIL [name] Education method: Explanation, Demonstration, Tactile cues, Verbal cues, and Handouts Education comprehension: verbalized understanding, returned demonstration, and needs further education  HOME EXERCISE PROGRAM: Access Code: [email or web address] URL:  [email or web address] Date: 11/30/2022 Prepared by: Norton Blizzard  Exercises - Supine Deep Neck Flexor Nods  - 1-2 x daily - 1 sets - 10 reps - 10 seconds hold - Sidelying Thoracic Rotation with Open Book  - 1 x daily - 1-2 sets - 10 reps - 5 seconds hold - Seated Cervical Retraction  - 1-2 x daily - 3 sets - 10 reps - 1 seconds hold - Seated Shoulder Row with Anchored Resistance  - 1 x daily - 3 sets - 15 reps - Seated Assisted Cervical Rotation with Towel  - 1 x daily - 1-2 sets - 10 reps - 5 seconds hold  HOME EXERCISE PROGRAM [HAMDPVB]  View at "[email or web address]" using code: HAMDPVB Suboccipital Stretch -  Repeat 3 Repetitions, Hold 1 Minute, Complete 1 Set, Perform 1 Times a Day  ASSESSMENT:  CLINICAL IMPRESSION: Patient arrives with her son BJ, who participated throughout the session. Patient appeared to get some relief for the rest of the day after last PT session but has not been doing her HEP each day and returns with similar pain to last session. She reported feeling better by the end of the session. She continues to require cuing for sufficient form for exercises to be most effective and safe. Plan to complete 10th visit progress note next session. Patient would benefit from continued management of limiting condition by skilled physical therapist to address remaining impairments and functional limitations to work towards stated goals and return to PLOF or maximal functional independence.    From Initial PT Evaluation 10/25/2022:  Patient is a 82 y.o. female referred to outpatient physical therapy with a medical diagnosis of cervicalgia who presents with signs and symptoms consistent with left sided neck pain with radiation to the left side of the head reproducing patient's headache complaint. Patient also has limited AROM especially into left rotation that is associated with the neck pain and headache and left arm discomfort possibly radiating from left sided neck.  Patient presents with significant posture, joint stiffness, ROM, motor control, muscle tension, muscle performance (strength/power/endurance) and activity tolerance impairments that are limiting ability to complete usual activities such as watching TV, being with family, crosswords, play solitaire on her phone, turning her head, flexing or extending neck, prolonged sitting, traveling without difficulty and making her feel like "doing much of nothing." Patient will benefit from skilled physical therapy intervention to address current body structure impairments and activity limitations to improve function and work towards goals set in current POC in order to return to prior level of function or maximal functional improvement.    OBJECTIVE IMPAIRMENTS: Abnormal gait, decreased activity tolerance, decreased cognition, decreased coordination, decreased endurance, decreased knowledge of condition, decreased mobility, difficulty walking, decreased ROM, decreased strength, hypomobility, impaired perceived functional ability, increased muscle spasms, impaired flexibility, impaired UE functional use, improper body mechanics, postural dysfunction, and pain.   ACTIVITY LIMITATIONS: carrying, lifting, sitting, sleeping, transfers, bed mobility, bathing, dressing, hygiene/grooming, and locomotion level  PARTICIPATION LIMITATIONS: meal prep, cleaning, interpersonal relationship, shopping, community activity, and   difficulty feeling like "doing much of nothing," watching TV, being with family, crosswords, play solitaire on her phone, turning her head, flexing or extending neck, prolonged sitting, traveling  PERSONAL FACTORS: Age, Fitness, Past/current experiences, Time since onset of injury/illness/exacerbation, and 3+ comorbidities:   OSA treated with BiPAP; WEIGHT GAIN; HOARSENESS; Hyperlipidemia associated with type 2 diabetes mellitus (HCC); GERD (gastroesophageal reflux disease); Essential hypertension; Type 2  diabetes mellitus treated with insulin (HCC); Dyspnea; Diverticulosis; Unstable angina - history of; Hypothyroid; Tracheobronchomalacia; 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 She  has a past medical history of Abnormal weight gain, Acute diverticulitis (05/13/2021), Arthritis, COPD (chronic obstructive pulmonary disease) (HCC), Coronary artery disease, non-occlusive (06/13/2013), Diverticulosis, GERD (gastroesophageal reflux disease), History of stress test, Hyperlipidemia, Hypertension, NIDDM (non-insulin dependent diabetes  mellitus), OSA (obstructive sleep apnea), and Tracheobronchomalacia. She  has a past surgical history that includes Tubal ligation; Cardiac catheterization (06/2013); transthoracic echocardiogram (06/17/2013); and left heart catheterization with coronary angiogram (N/A, 06/16/2013) are also affecting patient's functional outcome.   REHAB POTENTIAL: Good  CLINICAL DECISION MAKING: Evolving/moderate complexity  EVALUATION COMPLEXITY: Moderate   GOALS: Goals reviewed with patient? No  SHORT TERM GOALS: Target date: 11/08/2022  Patient will be independent with initial home exercise program for self-management of symptoms. Baseline: Initial HEP provided at IE (10/25/22); Goal status: In-progress   LONG TERM GOALS: Target date: 01/17/2023  Patient will be independent with a long-term home exercise program for self-management of  symptoms.  Baseline: Initial HEP provided at IE (10/25/22); Goal status: In-progress  2.  Patient will demonstrate improved FOTO by equal or greater than 10 points by visit #10 to demonstrate improvement in overall condition and self-reported functional ability.  Baseline: to be measured visit 2 as appropriate (10/25/22); Goal status: In-progress  3.  Patient will improve cervical spine rotation to equal or greater than 60 degrees each direction without increased pain to improve ability to view her surroundings without difficulty.  Baseline: R/L 67/50 with concordant pain (10/25/22); Goal status: In-progress  4.  Patient will score equal or greater than 26 seconds on the Deep Neck Flexor Endurance Test to demonstrate improved neck strength/endurance and reach age matched norm in healthy women to improve her ability to complete her daily activities with less difficulty due to neck pain associated with fatigue.  Baseline: to be tested visit 2 as appropriate (10/25/22); Goal status: In-progress  5.  Patient will complete community, work and/or recreational activities with 50% less limitation due to current condition.  Baseline: difficulty feeling like "doing much of nothing," watching TV, being with family, crosswords, play solitaire on her phone, turning her head, flexing or extending neck, prolonged sitting, traveling (10/25/22); Goal status: In-progress   PLAN:  PT FREQUENCY: 1-2x/week  PT DURATION: 12 weeks  PLANNED INTERVENTIONS: Therapeutic exercises, Therapeutic activity, Neuromuscular re-education, Patient/Family education, Self Care, DME instructions, Dry Needling, Electrical stimulation, Cryotherapy, Moist heat, Manual therapy, and Re-evaluation  PLAN FOR NEXT SESSION: update HEP as appropriate, progressive postural/UE/functional strengthening, motor control, and ROM exercises as appropriate. Education. Manual therapy as needed.    Cira Rue, PT, DPT 12/04/2022, 10:35  AM  [facility] [address] [place], [place] [address] P: [phone] I F: [phone]

## 2022-12-06 ENCOUNTER — Encounter: Payer: Self-pay | Admitting: Physical Therapy

## 2022-12-06 ENCOUNTER — Ambulatory Visit: Payer: Medicare Other | Admitting: Physical Therapy

## 2022-12-06 DIAGNOSIS — M792 Neuralgia and neuritis, unspecified: Secondary | ICD-10-CM | POA: Diagnosis not present

## 2022-12-06 DIAGNOSIS — R519 Headache, unspecified: Secondary | ICD-10-CM | POA: Diagnosis not present

## 2022-12-06 DIAGNOSIS — M542 Cervicalgia: Secondary | ICD-10-CM | POA: Diagnosis not present

## 2022-12-06 NOTE — Therapy (Addendum)
OUTPATIENT PHYSICAL THERAPY TREATMENT / PROGRESS NOTE Dates of reporting from 10/25/2022 to 12/06/2022   Patient Name: April [name] MRN: [id number] DOB:1940-04-26, 82 y.o., female Today's Date: 12/06/2022  END OF SESSION:  PT End of Session - 12/06/22 2055     Visit Number 10    Number of Visits 13    Date for PT Re-Evaluation 01/17/23    Authorization Type [employer] MEDICARE reporting period from 10/25/2022    Progress Note Due on Visit 10    PT Start Time 0950    PT Stop Time 1030    PT Time Calculation (min) 40 min    Activity Tolerance Patient tolerated treatment well;Patient limited by pain    Behavior During Therapy [facility] for tasks assessed/performed                Past Medical History:  Diagnosis Date   Abnormal weight gain    Acute diverticulitis 05/13/2021   Arthritis    "joints" (06/13/2013)   COPD (chronic obstructive pulmonary disease) (HCC)    Coronary artery disease, non-occlusive 06/13/2013   Cardiac Cath: sharp Angle take-off of Large Dominant Cx: ~70-80% mid Cx bifurcation lesion (Lateral OM &  AVGCx-PL-PDA both with hairpin ostial & ~50% lesions) - Not PCI amenable due to vessel tortuosity; ~40-50% mid LAD;Ramus - no significnat diseaes; small non-dominant RCA   Diverticulosis    GERD (gastroesophageal reflux disease)    History of stress test    a. 09/2006 nl Dobutamine Echo   Hyperlipidemia    Hypertension    NIDDM (non-insulin dependent diabetes mellitus)    On home oxygen therapy    "2L q hs; runs into my BIPAP" (06/13/2013)   OSA (obstructive sleep apnea)    "BIPAP w/O2" (06/13/2013)   Tracheobronchomalacia    a. followed by [facility]   Past Surgical History:  Procedure Laterality Date   CARDIAC CATHETERIZATION  06/2013   Cardiac Cath: sharp Angle take-off of Large Dominant Cx: ~70-80% mid Cx bifurcation lesion (Lateral OM &  AVGCx-PL-PDA both with hairpin ostial & ~50% lesions) - Not PCI amenable due to vessel tortuosity; ~40-50% mid LAD;Ramus -  no significnat diseaes; small non-dominant RCA   LEFT HEART CATHETERIZATION WITH CORONARY ANGIOGRAM N/A 06/16/2013   Procedure: LEFT HEART CATHETERIZATION WITH CORONARY ANGIOGRAM;  Surgeon: Marykay Lex, MD;  Location: [facility] CATH LAB;  Service: Cardiovascular;  Laterality: N/A;   TRANSTHORACIC ECHOCARDIOGRAM  06/17/2013   Normal LV size and function. EF 55-60% with no regional WMA. Grade 1 diastolic dysfunction. No significant valvular lesions   TUBAL LIGATION     Patient Active Problem List   Diagnosis Date Noted   Excessive body weight loss 11/19/2022   Acute metabolic encephalopathy 07/07/2022   Acute encephalopathy 07/06/2022   Nausea vomiting and diarrhea 07/06/2022   Altered mental status 07/05/2022   Falls 05/01/2022   Aortic stenosis, mild 04/20/2022   Pulmonary embolism (HCC) 04/18/2022   Cholelithiasis 04/18/2022   Wheezing 04/18/2022   Acute respiratory failure with hypoxia (HCC) 04/18/2022   Age-related osteoporosis with current pathological fracture, vertebra(e), initial encounter for fracture (HCC) 04/04/2022   Inflammation of sacroiliac joint (HCC) 04/04/2022   Polyneuropathy 04/04/2022   Subacute cough 03/28/2022   Pedal edema 11/04/2020   Scoliosis of lumbar spine 08/04/2020   Lumbar spondylosis 08/04/2020   Radiculopathy, lumbar region 05/19/2020   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) 07/01/2013   Hypothyroid 06/16/2013   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   Unstable angina -  history of 06/13/2013   Hyperlipidemia associated with type 2 diabetes mellitus (HCC)    GERD (gastroesophageal reflux disease)    Essential hypertension    Type 2 diabetes mellitus treated with insulin (HCC)    Dyspnea    Diverticulosis    HOARSENESS 06/30/2009   OSA treated with BiPAP 01/01/2007   WEIGHT GAIN 01/01/2007    PCP:  Donita Brooks, MD  REFERRING PROVIDER: Ocie Doyne, MD   REFERRING DIAG: cervicalgia  THERAPY DIAG:   Cervicalgia  Nonintractable headache, unspecified chronicity pattern, unspecified headache type  Neuralgia and neuritis  Rationale for Evaluation and Treatment: Rehabilitation  ONSET DATE: Christmas season 2023.   PERTINENT HISTORY:  Patient is a 82 y.o. female who presents to outpatient physical therapy with a referral for medical diagnosis cervicalgia. This patient's chief complaints consist of left sided neck pain and stiffness and headache that is worst near the mastoid process and intermittently radiates to the top of her head, temple, and behind left eye, and left UE pain leading to the following functional deficits: difficulty feeling like "doing much of nothing," watching TV, being with family, crosswords, play solitaire on her phone, turning her head, flexing or extending neck, prolonged sitting, traveling. 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 She  has a past medical history of Abnormal weight gain, Acute diverticulitis (05/13/2021), Arthritis, COPD (chronic  obstructive pulmonary disease) (HCC), Coronary artery disease, non-occlusive (06/13/2013), Diverticulosis, GERD (gastroesophageal reflux disease), History of stress test, Hyperlipidemia, Hypertension, NIDDM (non-insulin dependent diabetes mellitus), OSA (obstructive sleep apnea), and Tracheobronchomalacia. She  has a past surgical history that includes Tubal ligation; Cardiac catheterization (06/2013); transthoracic echocardiogram (06/17/2013); and left heart catheterization with coronary angiogram (N/A, 06/16/2013).  Patient/DIL denies hx of cancer, stroke, seizures, unexplained weight loss, unexplained changes in bowel or bladder problems, unexplained stumbling or dropping things, and spinal surgery.  SUBJECTIVE:  SUBJECTIVE STATEMENT: Patient reports headache since Monday that got worse after her last PT session. Headache hurts worse when lying on that side of her head and with moving. She said she keeps hoping therapy will help but not sure if it's helping.  She said it seemed like PT was helping but hit a setback once she got sick.  Patient reports that she feels recovered from pneumonia and that she's been more active. Caregiver impression: Caregiver thinks it's helping but she reports that patient has a hard time articulating and goes back and forth. She complains of the headache less often and has seemed more energetic and more active.   PAIN:  NPRS: 7/10 behind left ear radiating to the top of the left side of her head and behind her left eye.  PRECAUTIONS: Fall  PATIENT GOALS: "want to get her head to quit hurting and would like to get my back to quit hurting"   NEXT MD VISIT: 6 months from the last visit  OBJECTIVE  SELF-REPORTED FUNCTION FOTO score: 52/100 (neck questionnaire)  SPINE  MOTION CERVICAL SPINE AROM *Indicates pain Flexion: 70 Extension: 50  Side Flexion:       R 40      L 45 (pain and popping) Rotation:  R 50 left concordant pain L 50 left concordant pain +  FUNCTIONAL TEST Deep Neck Flexor Endurance Test: 48 seconds - incomplete chin tuck   TODAY'S TREATMENT: Therapeutic exercise: to centralize symptoms and improve ROM, strength, muscular endurance, and activity tolerance required for successful completion of functional activities.  - NuStep level 2 using bilateral upper and lower extremities. Seat/handle setting 7/9. For improved extremity mobility, muscular endurance, and activity tolerance; and to induce the analgesic effect of aerobic exercise, stimulate improved joint nutrition, and prepare body structures and systems for following interventions. x 10.5  minutes. Average SPM = 51.  - Tests and measures to assess progress (see above)  Manual therapy: to reduce pain and tissue tension, improve range of motion, neuromodulation, in order to promote improved ability to complete functional activities. SUPINE - STM to B suboccipital and cervical paraspinal muscles, upper traps, and SCMs. - gentle intermittent cervical spine traction  Pt required multimodal cuing for proper technique and to facilitate improved neuromuscular control, strength, range of motion, and functional ability resulting in improved performance and form.  PATIENT EDUCATION:  Education details: Exercise purpose/form. Self management techniques. POC, progress. Education on HEP including handout. Person educated: Patient and Caregiver DIL [name] Education method: Explanation, Demonstration, Tactile cues, Verbal cues, and Handouts Education comprehension: verbalized understanding, returned demonstration, and needs further education  HOME EXERCISE PROGRAM: Access Code: [email or web address] URL: [email or web address] Date: 11/30/2022 Prepared by: Norton Blizzard  Exercises - Supine  Deep Neck Flexor Nods  - 1-2 x daily - 1 sets - 10 reps - 10 seconds hold - Sidelying Thoracic Rotation with Open Book  - 1 x daily - 1-2 sets - 10 reps - 5 seconds hold - Seated Cervical Retraction  - 1-2 x daily - 3 sets - 10 reps - 1 seconds hold - Seated Shoulder Row with Anchored Resistance  - 1 x daily - 3 sets - 15 reps - Seated Assisted Cervical Rotation with Towel  - 1 x daily - 1-2 sets - 10 reps - 5 seconds hold  HOME EXERCISE PROGRAM [HAMDPVB] View at [email or web address]" using code: [email or web address] Suboccipital Stretch -  Repeat 3 Repetitions, Hold 1 Minute, Complete 1 Set, Perform 1 Times a Day  ASSESSMENT:  CLINICAL IMPRESSION:  Patient has attended 10 physical therapy sessions since starting current episode of care on 10/25/2022. Patient has cognitive deficits at baseline and has difficulty reporting changes over time. Subjectively, patient reported she was improving before getting pneumonia 2 weeks ago, but now does not see improvement besides walking more regularly. Her DIL states she has observed improvements in patient's function, with her not complaining as much about neck and and head pain and being more activate since starting PT. Objectively, patient has met her FOTO goal and deep neck flexor endurance test goal. She continues to complain of high levels of pain that restrict her function and quality of life, and she has not had improvement in her cervical spine rotation She appears to have been participating better in her HEP prior to pneumonia. Patient has not yet reached her rehab potential and would benefit from continued PT, but also may benefit from further medical assessment since her improvements so far have been temporary and small. Patient would benefit from continued management of limiting condition by skilled physical therapist to address remaining impairments and functional limitations to work towards stated goals and return to PLOF or maximal functional independence.    From Initial PT Evaluation 10/25/2022:  Patient is a 82 y.o. female referred to outpatient physical therapy with a medical diagnosis of cervicalgia who presents with signs and symptoms consistent with left sided neck pain with radiation to the left side of the head reproducing patient's headache complaint. Patient also has limited AROM especially into left rotation that is associated with the neck pain and headache and left arm discomfort possibly radiating from left sided neck. Patient presents with significant posture, joint stiffness, ROM, motor control, muscle tension, muscle performance (strength/power/endurance) and activity tolerance impairments that are limiting ability to complete usual activities such as watching TV, being with family, crosswords, play solitaire on her phone, turning her head, flexing or extending neck, prolonged sitting, traveling without difficulty and making her feel like "doing much of nothing." Patient will benefit from skilled physical therapy intervention to address current body structure impairments and activity limitations to improve function and work towards goals set in current POC in order to return to prior level of function or maximal functional improvement.    OBJECTIVE IMPAIRMENTS: Abnormal gait, decreased activity tolerance, decreased cognition, decreased coordination, decreased endurance, decreased knowledge of condition, decreased mobility, difficulty walking, decreased ROM, decreased strength, hypomobility, impaired perceived functional ability, increased muscle spasms, impaired flexibility, impaired UE functional use, improper body mechanics, postural dysfunction, and pain.   ACTIVITY LIMITATIONS: carrying, lifting, sitting, sleeping, transfers, bed mobility, bathing, dressing, hygiene/grooming, and locomotion level  PARTICIPATION LIMITATIONS: meal prep, cleaning, interpersonal relationship, shopping, community activity, and   difficulty feeling like "doing  much of nothing," watching TV, being with family, crosswords, play solitaire on her phone, turning her head, flexing or extending neck, prolonged sitting, traveling  PERSONAL FACTORS: Age, Fitness, Past/current experiences, Time since onset of injury/illness/exacerbation, and 3+ comorbidities:   OSA treated with BiPAP; WEIGHT GAIN; HOARSENESS; Hyperlipidemia associated with type 2 diabetes mellitus (HCC); GERD (gastroesophageal reflux disease); Essential hypertension; Type 2 diabetes mellitus treated with insulin (HCC); Dyspnea; Diverticulosis; Unstable angina - history of; Hypothyroid; Tracheobronchomalacia; 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); Pedal edema; Scoliosis of lumbar spine; Lumbar spondylosis; Radiculopathy, lumbar region; Subacute cough; Age-related osteoporosis with current pathological fracture, vertebra(e), initial encounter for fracture (HCC); Inflammation of sacroiliac joint (HCC); Polyneuropathy; Acute pulmonary embolism (HCC); Cholelithiasis; Wheezing; Acute respiratory failure with hypoxia (HCC); Falls; Altered  mental status; Acute encephalopathy; Nausea vomiting and diarrhea; and Acute metabolic encephalopathy on their problem list. She  has a past medical history of Abnormal weight gain, Acute diverticulitis (05/13/2021), Arthritis, COPD (chronic obstructive pulmonary disease) (HCC), Coronary artery disease, non-occlusive (06/13/2013), Diverticulosis, GERD (gastroesophageal reflux disease), History of stress test, Hyperlipidemia, Hypertension, NIDDM (non-insulin dependent diabetes mellitus), OSA (obstructive sleep apnea), and Tracheobronchomalacia. She  has a past surgical history that includes Tubal ligation; Cardiac catheterization (06/2013); transthoracic echocardiogram (06/17/2013); and left heart catheterization with coronary angiogram (N/A, 06/16/2013) are also affecting patient's functional outcome.   REHAB POTENTIAL: Good  CLINICAL  DECISION MAKING: Evolving/moderate complexity  EVALUATION COMPLEXITY: Moderate   GOALS: Goals reviewed with patient? No  SHORT TERM GOALS: Target date: 11/08/2022  Patient will be independent with initial home exercise program for self-management of symptoms. Baseline: Initial HEP provided at IE (10/25/22); Goal status: MET   LONG TERM GOALS: Target date: 01/17/2023  Patient will be independent with a long-term home exercise program for self-management of symptoms.  Baseline: Initial HEP provided at IE (10/25/22); participating fair - less after pneumonia (12/06/2022);  Goal status: In-progress  2.  Patient will demonstrate improved FOTO by equal or greater than 10 points by visit #10 to demonstrate improvement in overall condition and self-reported functional ability.  Baseline: to be measured visit 2 as appropriate (10/25/22); 39 at visit #2 (10/30/2022); 52 at visit #10 (12/06/2022);  Goal status: MET (score of 52 on 12/06/22)    3.  Patient will improve cervical spine rotation to equal or greater than 60 degrees each direction without increased pain to improve ability to view her surroundings without difficulty.  Baseline: R/L 67/50 with concordant pain (10/25/22); R/L 50/50 with concordant pain (12/06/22); Goal status: Ongoing  4.  Patient will score equal or greater than 26 seconds on the Deep Neck Flexor Endurance Test to demonstrate improved neck strength/endurance and reach age matched norm in healthy women to improve her ability to complete her daily activities with less difficulty due to neck pain associated with fatigue.  Baseline: to be tested visit 2 as appropriate (10/25/22); Goal status: MET (48 seconds on 12/06/22)  5.  Patient will complete community, work and/or recreational activities with 50% less limitation due to current condition.  Baseline: difficulty feeling like "doing much of nothing," watching TV, being with family, crosswords, play solitaire on her  phone, turning her head, flexing or extending neck, prolonged sitting, traveling (10/25/22); deferred due to patient having difficulty distinguishing from current symptoms and overall symptoms/function (12/06/2022);  Goal status: Ongoing   PLAN:  PT FREQUENCY: 1-2x/week  PT DURATION: 12 weeks  PLANNED INTERVENTIONS: Therapeutic exercises, Therapeutic activity, Neuromuscular re-education, Patient/Family education, Self Care, DME instructions, Dry Needling, Electrical stimulation, Cryotherapy, Moist heat, Manual therapy, and Re-evaluation  PLAN FOR NEXT SESSION: update HEP as appropriate, progressive postural/UE/functional strengthening, motor control, and ROM exercises as appropriate. Education. Manual therapy as needed.    Matthew Saras, Student-PT 12/06/2022, 9:57 AM  Luretha Murphy. Ilsa Iha, PT, DPT 12/06/22, 9:08 PM  [facility] [address] [place], [place] [address] P: [phone] I F: [phone]

## 2022-12-07 ENCOUNTER — Other Ambulatory Visit: Payer: Self-pay

## 2022-12-11 ENCOUNTER — Ambulatory Visit: Payer: Medicare Other | Admitting: Physical Therapy

## 2022-12-11 DIAGNOSIS — R519 Headache, unspecified: Secondary | ICD-10-CM | POA: Diagnosis not present

## 2022-12-11 DIAGNOSIS — M792 Neuralgia and neuritis, unspecified: Secondary | ICD-10-CM

## 2022-12-11 DIAGNOSIS — M542 Cervicalgia: Secondary | ICD-10-CM | POA: Diagnosis not present

## 2022-12-11 NOTE — Therapy (Signed)
OUTPATIENT PHYSICAL THERAPY TREATMENT    Patient Name: April [name] MRN: [id number] DOB:05/11/1940, 82 y.o., female Today's Date: 12/12/2022  END OF SESSION:  PT End of Session - 12/12/22 2004     Visit Number 11    Number of Visits 13    Date for PT Re-Evaluation 01/17/23    Authorization Type [employer] MEDICARE reporting period from 910/23/2024    Progress Note Due on Visit 10    PT Start Time 0905    PT Stop Time 0945    PT Time Calculation (min) 40 min    Activity Tolerance Patient tolerated treatment well;Patient limited by pain    Behavior During Therapy [facility] for tasks assessed/performed             Past Medical History:  Diagnosis Date   Abnormal weight gain    Acute diverticulitis 05/13/2021   Arthritis    "joints" (06/13/2013)   COPD (chronic obstructive pulmonary disease) (HCC)    Coronary artery disease, non-occlusive 06/13/2013   Cardiac Cath: sharp Angle take-off of Large Dominant Cx: ~70-80% mid Cx bifurcation lesion (Lateral OM &  AVGCx-PL-PDA both with hairpin ostial & ~50% lesions) - Not PCI amenable due to vessel tortuosity; ~40-50% mid LAD;Ramus - no significnat diseaes; small non-dominant RCA   Diverticulosis    GERD (gastroesophageal reflux disease)    History of stress test    a. 09/2006 nl Dobutamine Echo   Hyperlipidemia    Hypertension    NIDDM (non-insulin dependent diabetes mellitus)    On home oxygen therapy    "2L q hs; runs into my BIPAP" (06/13/2013)   OSA (obstructive sleep apnea)    "BIPAP w/O2" (06/13/2013)   Tracheobronchomalacia    a. followed by [facility]   Past Surgical History:  Procedure Laterality Date   CARDIAC CATHETERIZATION  06/2013   Cardiac Cath: sharp Angle take-off of Large Dominant Cx: ~70-80% mid Cx bifurcation lesion (Lateral OM &  AVGCx-PL-PDA both with hairpin ostial & ~50% lesions) - Not PCI amenable due to vessel tortuosity; ~40-50% mid LAD;Ramus - no significnat diseaes; small non-dominant RCA   LEFT HEART  CATHETERIZATION WITH CORONARY ANGIOGRAM N/A 06/16/2013   Procedure: LEFT HEART CATHETERIZATION WITH CORONARY ANGIOGRAM;  Surgeon: Marykay Lex, MD;  Location: [facility] CATH LAB;  Service: Cardiovascular;  Laterality: N/A;   TRANSTHORACIC ECHOCARDIOGRAM  06/17/2013   Normal LV size and function. EF 55-60% with no regional WMA. Grade 1 diastolic dysfunction. No significant valvular lesions   TUBAL LIGATION     Patient Active Problem List   Diagnosis Date Noted   Excessive body weight loss 11/19/2022   Acute metabolic encephalopathy 07/07/2022   Acute encephalopathy 07/06/2022   Nausea vomiting and diarrhea 07/06/2022   Altered mental status 07/05/2022   Falls 05/01/2022   Aortic stenosis, mild 04/20/2022   Pulmonary embolism (HCC) 04/18/2022   Cholelithiasis 04/18/2022   Wheezing 04/18/2022   Acute respiratory failure with hypoxia (HCC) 04/18/2022   Age-related osteoporosis with current pathological fracture, vertebra(e), initial encounter for fracture (HCC) 04/04/2022   Inflammation of sacroiliac joint (HCC) 04/04/2022   Polyneuropathy 04/04/2022   Subacute cough 03/28/2022   Pedal edema 11/04/2020   Scoliosis of lumbar spine 08/04/2020   Lumbar spondylosis 08/04/2020   Radiculopathy, lumbar region 05/19/2020   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) 07/01/2013   Hypothyroid 06/16/2013   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   Unstable angina - history of 06/13/2013   Hyperlipidemia associated with type 2 diabetes mellitus (  HCC)    GERD (gastroesophageal reflux disease)    Essential hypertension    Type 2 diabetes mellitus treated with insulin (HCC)    Dyspnea    Diverticulosis    HOARSENESS 06/30/2009   OSA treated with BiPAP 01/01/2007   WEIGHT GAIN 01/01/2007    PCP:  Donita Brooks, MD  REFERRING PROVIDER: Ocie Doyne, MD   REFERRING DIAG: cervicalgia  THERAPY DIAG:  Cervicalgia  Nonintractable headache, unspecified  chronicity pattern, unspecified headache type  Neuralgia and neuritis  Rationale for Evaluation and Treatment: Rehabilitation  ONSET DATE: Christmas season 2023.   PERTINENT HISTORY:  Patient is a 82 y.o. female who presents to outpatient physical therapy with a referral for medical diagnosis cervicalgia. This patient's chief complaints consist of left sided neck pain and stiffness and headache that is worst near the mastoid process and intermittently radiates to the top of her head, temple, and behind left eye, and left UE pain leading to the following functional deficits: difficulty feeling like "doing much of nothing," watching TV, being with family, crosswords, play solitaire on her phone, turning her head, flexing or extending neck, prolonged sitting, traveling. 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 She  has a past medical history of Abnormal weight gain, Acute diverticulitis (05/13/2021), Arthritis, COPD (chronic obstructive pulmonary disease) (HCC), Coronary artery disease,  non-occlusive (06/13/2013), Diverticulosis, GERD (gastroesophageal reflux disease), History of stress test, Hyperlipidemia, Hypertension, NIDDM (non-insulin dependent diabetes mellitus), OSA (obstructive sleep apnea), and Tracheobronchomalacia. She  has a past surgical history that includes Tubal ligation; Cardiac catheterization (06/2013); transthoracic echocardiogram (06/17/2013); and left heart catheterization with coronary angiogram (N/A, 06/16/2013).  Patient/DIL denies hx of cancer, stroke, seizures, unexplained weight loss, unexplained changes in bowel or bladder problems, unexplained stumbling or dropping things, and spinal surgery.  SUBJECTIVE:  SUBJECTIVE STATEMENT: Patient arrives with her DIL [name] and using a SPC. She states she unexpectedly stepped off the edge of her friend's sidewalk in the dark last night and fell and hit her left knee and cheekbone. She was with her son who helped her up. She denies diplopia, difficulty with speech, dizziness, or excessive pain since the fall. She states she has been doing her HEP daily. She does not remember how she felt after last time she was here. She states her memory is not as good as it used to be. She states her head/neck hurts every time it pops (demonstrates left extension/rotation/sidebending).   PAIN:  NPRS: 6/10 behind left ear radiating to the top of the left side of her head and behind her left eye. L cheekbone is "sore."  PRECAUTIONS: Fall  PATIENT GOALS: "want to get her head to quit hurting and would like to get my back to quit hurting"   NEXT MD VISIT: 6 months from the last visit  OBJECTIVE  TODAY'S TREATMENT: Therapeutic exercise: to centralize symptoms and improve ROM, strength, muscular endurance, and activity tolerance required  for successful completion of functional activities.  - NuStep level 2 using bilateral upper and lower extremities. Seat/handle setting 7/9. For improved extremity mobility, muscular endurance, and activity tolerance; and to induce the analgesic effect of aerobic exercise, stimulate improved joint nutrition, and prepare body structures and systems for following interventions. 5 minutes. Average SPM = 73.  - seated C-spine AROM assessment before/after manual  Manual therapy: to reduce pain and tissue tension, improve range of motion, neuromodulation, in order to promote improved ability to complete functional activities. SUPINE - STM to B suboccipital and cervical paraspinal muscles, upper traps, and SCMs. - cervical spine joint mobilizations grade II-III, CPA, upglides, lateral glides, C2-7 with various amounts of time spent at each location.  - gentle intermittent cervical spine traction  Pt required multimodal cuing for proper technique and to facilitate improved neuromuscular control, strength, range of motion, and functional ability resulting in improved performance and form.  PATIENT EDUCATION:  Education details: Exercise purpose/form. Self management techniques. POC, progress. Education on HEP including handout. Person educated: Patient and Caregiver DIL [name] Education method: Explanation, Demonstration, Tactile cues, Verbal cues, and Handouts Education comprehension: verbalized understanding, returned demonstration, and needs further education  HOME EXERCISE PROGRAM: Access Code: [email or web address] URL: [email or web address] Date: 11/30/2022 Prepared by: Norton Blizzard  Exercises - Supine Deep Neck Flexor Nods  - 1-2 x daily - 1 sets - 10 reps - 10 seconds hold - Sidelying Thoracic Rotation with Open Book  - 1 x daily - 1-2 sets - 10 reps - 5 seconds hold - Seated Cervical Retraction  - 1-2 x daily - 3 sets - 10 reps - 1 seconds hold - Seated Shoulder Row with Anchored  Resistance  - 1 x daily - 3 sets - 15 reps - Seated Assisted Cervical Rotation with Towel  - 1 x daily - 1-2 sets - 10 reps - 5 seconds hold  HOME EXERCISE PROGRAM [HAMDPVB] View at [email or web address]" using code: HAMDPVB Suboccipital Stretch -  Repeat 3 Repetitions, Hold 1 Minute, Complete 1 Set, Perform 1 Times a Day  ASSESSMENT:  CLINICAL IMPRESSION: Patient arrives with large bruise over left eye after unexpectedly stepping off the edge of a walkway in the dark at a friends house last night. Her neck and head pain did not seemed worsened today and there were no red flags for serious injury after the fall. Today's session focused  in gentle joint mobilizations to improve cervical spine mobility, since patient is demonstrating/complaining of painful popping. Mild reduction in popping following manual therapy and popping that was there was less painful. Plan to continue with manual therapy interventions and strengthening exercises as tolerated. Patient would benefit from continued management of limiting condition by skilled physical therapist to address remaining impairments and functional limitations to work towards stated goals and return to PLOF or maximal functional independence.   From Initial PT Evaluation 10/25/2022:  Patient is a 82 y.o. female referred to outpatient physical therapy with a medical diagnosis of cervicalgia who presents with signs and symptoms consistent with left sided neck pain with radiation to the left side of the head reproducing patient's headache complaint. Patient also has limited AROM especially into left rotation that is associated with the neck pain and headache and left arm discomfort possibly radiating from left sided neck. Patient presents with significant posture, joint stiffness, ROM, motor control, muscle tension, muscle performance (strength/power/endurance) and activity tolerance impairments that are limiting ability to complete usual activities such as  watching TV, being with family, crosswords, play solitaire on her phone, turning her head, flexing or extending neck, prolonged sitting, traveling without difficulty and making her feel like "doing much of nothing." Patient will benefit from skilled physical therapy intervention to address current body structure impairments and activity limitations to improve function and work towards goals set in current POC in order to return to prior level of function or maximal functional improvement.    OBJECTIVE IMPAIRMENTS: Abnormal gait, decreased activity tolerance, decreased cognition, decreased coordination, decreased endurance, decreased knowledge of condition, decreased mobility, difficulty walking, decreased ROM, decreased strength, hypomobility, impaired perceived functional ability, increased muscle spasms, impaired flexibility, impaired UE functional use, improper body mechanics, postural dysfunction, and pain.   ACTIVITY LIMITATIONS: carrying, lifting, sitting, sleeping, transfers, bed mobility, bathing, dressing, hygiene/grooming, and locomotion level  PARTICIPATION LIMITATIONS: meal prep, cleaning, interpersonal relationship, shopping, community activity, and   difficulty feeling like "doing much of nothing," watching TV, being with family, crosswords, play solitaire on her phone, turning her head, flexing or extending neck, prolonged sitting, traveling  PERSONAL FACTORS: Age, Fitness, Past/current experiences, Time since onset of injury/illness/exacerbation, and 3+ comorbidities:   OSA treated with BiPAP; WEIGHT GAIN; HOARSENESS; Hyperlipidemia associated with type 2 diabetes mellitus (HCC); GERD (gastroesophageal reflux disease); Essential hypertension; Type 2 diabetes mellitus treated with insulin (HCC); Dyspnea; Diverticulosis; Unstable angina - history of; Hypothyroid; Tracheobronchomalacia; 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); Pedal edema; Scoliosis of  lumbar spine; Lumbar spondylosis; Radiculopathy, lumbar region; Subacute cough; Age-related osteoporosis with current pathological fracture, vertebra(e), initial encounter for fracture (HCC); Inflammation of sacroiliac joint (HCC); Polyneuropathy; Acute pulmonary embolism (HCC); Cholelithiasis; Wheezing; Acute respiratory failure with hypoxia (HCC); Falls; Altered mental status; Acute encephalopathy; Nausea vomiting and diarrhea; and Acute metabolic encephalopathy on their problem list. She  has a past medical history of Abnormal weight gain, Acute diverticulitis (05/13/2021), Arthritis, COPD (chronic obstructive pulmonary disease) (HCC), Coronary artery disease, non-occlusive (06/13/2013), Diverticulosis, GERD (gastroesophageal reflux disease), History of stress test, Hyperlipidemia, Hypertension, NIDDM (non-insulin dependent diabetes mellitus), OSA (obstructive sleep apnea), and Tracheobronchomalacia. She  has a past surgical history that includes Tubal ligation; Cardiac catheterization (06/2013); transthoracic echocardiogram (06/17/2013); and left heart catheterization with coronary angiogram (N/A, 06/16/2013) are also affecting patient's functional outcome.   REHAB POTENTIAL: Good  CLINICAL DECISION MAKING: Evolving/moderate complexity  EVALUATION COMPLEXITY: Moderate   GOALS: Goals reviewed with patient? No  SHORT TERM GOALS:  Target date: 11/08/2022  Patient will be independent with initial home exercise program for self-management of symptoms. Baseline: Initial HEP provided at IE (10/25/22); Goal status: MET   LONG TERM GOALS: Target date: 01/17/2023  Patient will be independent with a long-term home exercise program for self-management of symptoms.  Baseline: Initial HEP provided at IE (10/25/22); participating fair - less after pneumonia (12/06/2022);  Goal status: In-progress  2.  Patient will demonstrate improved FOTO by equal or greater than 10 points by visit #10 to demonstrate  improvement in overall condition and self-reported functional ability.  Baseline: to be measured visit 2 as appropriate (10/25/22); 39 at visit #2 (10/30/2022); 52 at visit #10 (12/06/2022);  Goal status: MET (score of 52 on 12/06/22)    3.  Patient will improve cervical spine rotation to equal or greater than 60 degrees each direction without increased pain to improve ability to view her surroundings without difficulty.  Baseline: R/L 67/50 with concordant pain (10/25/22); R/L 50/50 with concordant pain (12/06/22); Goal status: Ongoing  4.  Patient will score equal or greater than 26 seconds on the Deep Neck Flexor Endurance Test to demonstrate improved neck strength/endurance and reach age matched norm in healthy women to improve her ability to complete her daily activities with less difficulty due to neck pain associated with fatigue.  Baseline: to be tested visit 2 as appropriate (10/25/22); Goal status: MET (48 seconds on 12/06/22)  5.  Patient will complete community, work and/or recreational activities with 50% less limitation due to current condition.  Baseline: difficulty feeling like "doing much of nothing," watching TV, being with family, crosswords, play solitaire on her phone, turning her head, flexing or extending neck, prolonged sitting, traveling (10/25/22); deferred due to patient having difficulty distinguishing from current symptoms and overall symptoms/function (12/06/2022);  Goal status: Ongoing   PLAN:  PT FREQUENCY: 1-2x/week  PT DURATION: 12 weeks  PLANNED INTERVENTIONS: Therapeutic exercises, Therapeutic activity, Neuromuscular re-education, Patient/Family education, Self Care, DME instructions, Dry Needling, Electrical stimulation, Cryotherapy, Moist heat, Manual therapy, and Re-evaluation  PLAN FOR NEXT SESSION: update HEP as appropriate, progressive postural/UE/functional strengthening, motor control, and ROM exercises as appropriate. Education. Manual therapy as  needed.    Luretha Murphy. Ilsa Iha, PT, DPT 12/12/22, 8:11 PM  [facility] [address] [place], [place] [address] P: [phone] I F: [phone]

## 2022-12-12 ENCOUNTER — Encounter: Payer: Self-pay | Admitting: Physical Therapy

## 2022-12-13 ENCOUNTER — Telehealth: Payer: Self-pay | Admitting: Psychiatry

## 2022-12-13 ENCOUNTER — Encounter: Payer: Medicare Other | Admitting: Physical Therapy

## 2022-12-13 DIAGNOSIS — K080 Exfoliation of teeth due to systemic causes: Secondary | ICD-10-CM | POA: Diagnosis not present

## 2022-12-13 NOTE — Telephone Encounter (Signed)
Called April [name]. She is aware Dr. Delena Bali no longer at office. Relayed that AL,NP offered to fit in her mother Monday 12/18/22 at 2pm if they would like. She accepted. Aware to check in at 1:30pm.

## 2022-12-13 NOTE — Telephone Encounter (Signed)
Pt's daugher, April [name] ( do not have DPR in file), said she has been doing physical therapy since the September and physical therapy is not working. Therapist recommended discuss with neurologist to get a nerve block. Would like a call from the nurse to discuss.

## 2022-12-14 NOTE — Patient Instructions (Signed)
Below is our plan:  We will continue to monitor. Please let me know if you need me!  Please make sure you are staying well hydrated. I recommend 50-60 ounces daily. Well balanced diet and regular exercise encouraged. Consistent sleep schedule with 6-8 hours recommended.   Please continue follow up with care team as directed.   Follow up with me as needed  You may receive a survey regarding today's visit. I encourage you to leave honest feed back as I do use this information to improve patient care. Thank you for seeing me today!

## 2022-12-14 NOTE — Progress Notes (Signed)
Chief Complaint  Patient presents with   Room 1    Pt is here with her Daughter-in-Law. Pt has taken Eliquis 5mg  today.     HISTORY OF PRESENT ILLNESS:  12/18/22 ALL:  April [name] is a 82 y.o. female here today for follow up for occipital neuralgia. She was seen in consult with Dr Delena Bali 09/2022. Gabapentin was increased to 600/300/600 (originally on for neuropathy) and she was referred to PT for neck pain. Occipital nerve blocks discussed as option if needed.   She reports pain has waxed and waned. She reports most pain is in left occipital region but radiates to the down to the base of the neck and up to the frontal region. Increasing gabapentin did not help. She is tolerating it well. She did have a fall last week. She fell down the steps in the dark. She denies any significant injuries but does have bruising of the left eye. She has follow up with her PCP soon.    HISTORY (copied from Dr Quentin Mulling previous note)  Medical co-morbidities: CAD, HTN, OSA on BiPAP, COPD, HLD, DM2, hypothyroidism, PE on Eliquis   The patient presents for evaluation of headaches which began in December 2023 after she developed COVID. Headaches is described as a constant left-sided aching in her left occiput which radiates forward and down her neck. Pain does not radiate down her arms. She reports paresthesias and weak grip strength in bilateral hands due to her neuropathy. Headache fluctuates in severity and is worsened by the cold and by turning her head. No photophobia, phonophobia, or nausea. Has improved a little since the summer but continues to be daily.    Her PCP started Topamax for headache prevention, however she developed confusion so this was discontinued. She takes gabapentin 600 mg BID for neuropathy but this doesn't seem to help her headaches. Takes Tylenol as needed but this is not very effective.   Headache History: Onset: December 2023 Triggers: cold, turning her head Aura: no Location:  left occiput radiating forward Quality/Description: aching Associated Symptoms:             Photophobia: no             Phonophobia: no              Nausea: no Worse with activity?:  yes Duration of headaches: constant   Headache days per month: 30 Headache free days per month: 0   Current Treatment: Abortive Tylenol   Preventative none   Prior Therapies                                 Topamax - confusion Gabapentin 600 mg BID - lack of efficacy Metoprolol 25 mg BID Lexapro 10 mg daily Lisinopril  Losartan - hypokalemia   REVIEW OF SYSTEMS: Out of a complete 14 system review of symptoms, the patient complains only of the following symptoms, sharp pain in head and all other reviewed systems are negative.   ALLERGIES: Allergies  Allergen Reactions   Neomycin Other (See Comments)    Visual disturbance and swelling in eyes     HOME MEDICATIONS: Outpatient Medications Prior to Visit  Medication Sig Dispense Refill   albuterol (VENTOLIN HFA) 108 (90 Base) MCG/ACT inhaler Inhale 2 puffs into the lungs every 6 (six) hours as needed for wheezing or shortness of breath. 8 g 0   calcitonin, salmon, (MIACALCIN/FORTICAL) 200 UNIT/ACT nasal spray  Place into alternate nostrils.     Calcium Carbonate-Vitamin D (CALCIUM 600 + D PO) Take 1 tablet by mouth daily.     ELIQUIS 5 MG TABS tablet TAKE ONE TABLET (5 MG TOTAL) BY MOUTH TWO (TWO) TIMES DAILY. 180 tablet 1   empagliflozin (JARDIANCE) 10 MG TABS tablet Take 1 tablet (10 mg total) by mouth daily before breakfast. 30 tablet 3   escitalopram (LEXAPRO) 10 MG tablet TAKE ONE TABLET BY MOUTH ONCE A DAY 30 tablet 3   gabapentin (NEURONTIN) 300 MG capsule Take 300 mg in the afternoon. Take 600 mg pill in AM and PM for a total of 600 mg in the morning, 300 mg in the afternoon, and 600 mg at bedtime 90 capsule 2   gabapentin (NEURONTIN) 600 MG tablet TAKE ONE TABLET BY MOUTH FOUR TIMES DAILY 360 tablet 0   glucose blood (ONETOUCH VERIO)  test strip USE AS DIRECTED TO MONITOR BLOOD SUGARS UP TO THREE TIMES DAILY AS NEEDED 100 each 3   isosorbide mononitrate (IMDUR) 60 MG 24 hr tablet TAKE ONE TABLET BY MOUTH ONCE DAILY. (NEED APPT FOR FUTURE REFILLS) 30 tablet 11   LANTUS SOLOSTAR 100 UNIT/ML Solostar Pen INJECT 40-50 UNITS UNDER THE SKIN ONCE EVERY NIGHT AT BEDTIME. 15 mL 3   metFORMIN (GLUCOPHAGE) 500 MG tablet TAKE ONE TABLET BY MOUTH TWICE (2) DAILY WITH A MEAL 180 tablet 1   metoprolol tartrate (LOPRESSOR) 25 MG tablet TAKE ONE TABLET BY MOUTH TWICE A DAY 180 tablet 3   Omega-3 Fatty Acids (FISH OIL) 1000 MG CAPS Take 1 capsule by mouth daily.     omeprazole (PRILOSEC) 20 MG capsule Take 20 mg by mouth daily.     potassium chloride (KLOR-CON) 10 MEQ tablet Take 10 mEq by mouth 2 (two) times daily.     rosuvastatin (CRESTOR) 20 MG tablet Take 1 tablet (20 mg total) by mouth daily. 90 tablet 3   traMADol (ULTRAM) 50 MG tablet TAKE ONE TABLET BY MOUTH AT BEDTIME AS NEEDED FOR SEVERE PAIN 30 tablet 0   triamcinolone (KENALOG) 0.1 % paste Use as directed 1 Application in the mouth or throat 2 (two) times daily. 5 g 12   amoxicillin-clavulanate (AUGMENTIN) 875-125 MG tablet Take 1 tablet by mouth 2 (two) times daily. (Patient not taking: Reported on 12/18/2022) 20 tablet 0   predniSONE (DELTASONE) 20 MG tablet 3 tabs poqday 1-2, 2 tabs poqday 3-4, 1 tab poqday 5-6 (Patient not taking: Reported on 12/18/2022) 12 tablet 0   No facility-administered medications prior to visit.     PAST MEDICAL HISTORY: Past Medical History:  Diagnosis Date   Abnormal weight gain    Acute diverticulitis 05/13/2021   Arthritis    "joints" (06/13/2013)   COPD (chronic obstructive pulmonary disease) (HCC)    Coronary artery disease, non-occlusive 06/13/2013   Cardiac Cath: sharp Angle take-off of Large Dominant Cx: ~70-80% mid Cx bifurcation lesion (Lateral OM &  AVGCx-PL-PDA both with hairpin ostial & ~50% lesions) - Not PCI amenable due to vessel  tortuosity; ~40-50% mid LAD;Ramus - no significnat diseaes; small non-dominant RCA   Diverticulosis    GERD (gastroesophageal reflux disease)    History of stress test    a. 09/2006 nl Dobutamine Echo   Hyperlipidemia    Hypertension    NIDDM (non-insulin dependent diabetes mellitus)    On home oxygen therapy    "2L q hs; runs into my BIPAP" (06/13/2013)   OSA (obstructive sleep apnea)    "  BIPAP w/O2" (06/13/2013)   Tracheobronchomalacia    a. followed by [facility].     PAST SURGICAL HISTORY: Past Surgical History:  Procedure Laterality Date   CARDIAC CATHETERIZATION  06/2013   Cardiac Cath: sharp Angle take-off of Large Dominant Cx: ~70-80% mid Cx bifurcation lesion (Lateral OM &  AVGCx-PL-PDA both with hairpin ostial & ~50% lesions) - Not PCI amenable due to vessel tortuosity; ~40-50% mid LAD;Ramus - no significnat diseaes; small non-dominant RCA   LEFT HEART CATHETERIZATION WITH CORONARY ANGIOGRAM N/A 06/16/2013   Procedure: LEFT HEART CATHETERIZATION WITH CORONARY ANGIOGRAM;  Surgeon: Marykay Lex, MD;  Location: [facility] CATH LAB;  Service: Cardiovascular;  Laterality: N/A;   TRANSTHORACIC ECHOCARDIOGRAM  06/17/2013   Normal LV size and function. EF 55-60% with no regional WMA. Grade 1 diastolic dysfunction. No significant valvular lesions   TUBAL LIGATION       FAMILY HISTORY: Family History  Problem Relation Age of Onset   Heart disease Mother        developed coronary dzs in her 73's.   Clotting disorder Mother    Breast cancer Sister 22   Diabetes Paternal Aunt    Asthma Maternal Grandmother    Heart disease Maternal Grandmother    Heart disease Maternal Grandfather    Diabetes Brother    CAD Brother        s/p cabg in his 70's   CAD Brother        s/p cabg in his 75's.     SOCIAL HISTORY: Social History   Socioeconomic History   Marital status: Married    Spouse name: Not on file   Number of children: 3   Years of education: Not on file   Highest education  level: Not on file  Occupational History   Occupation: Engineer, petroleum  Tobacco Use   Smoking status: Former    Current packs/day: 0.00    Average packs/day: 1.5 packs/day for 20.0 years (30.0 ttl pk-yrs)    Types: Cigarettes    Start date: 02/13/1966    Quit date: 02/13/1986    Years since quitting: 36.8   Smokeless tobacco: Never  Vaping Use   Vaping status: Never Used  Substance and Sexual Activity   Alcohol use: No   Drug use: No   Sexual activity: Yes  Other Topics Concern   Not on file  Social History Narrative   Lives in [place] with her husband. 3 children.  Works is Futures trader.Former 30 Pk/yr Smoker (1.5 PPD x 20 yr) - quit in 1998.      Right handed    Wear glasses    Drinks coffee daily (1 cup in am)   Drinks soda caffeine free (coke)   Social Determinants of Health   Financial Resource Strain: Low Risk  (07/20/2022)   Overall Financial Resource Strain (CARDIA)    Difficulty of Paying Living Expenses: Not hard at all  Food Insecurity: No Food Insecurity (07/20/2022)   Hunger Vital Sign    Worried About Running Out of Food in the Last Year: Never true    Ran Out of Food in the Last Year: Never true  Transportation Needs: No Transportation Needs (07/20/2022)   PRAPARE - Administrator, Civil Service (Medical): No    Lack of Transportation (Non-Medical): No  Physical Activity: Inactive (07/20/2022)   Exercise Vital Sign    Days of Exercise per Week: 0 days    Minutes of Exercise per Session: 0 min  Stress:  No Stress Concern Present (07/20/2022)   [employer] of Occupational Health - Occupational Stress Questionnaire    Feeling of Stress : Not at all  Social Connections: Moderately Integrated (07/20/2022)   Social Connection and Isolation Panel [NHANES]    Frequency of Communication with Friends and Family: More than three times a week    Frequency of Social Gatherings with Friends and Family: Three times a week    Attends Religious Services:  More than 4 times per year    Active Member of Clubs or Organizations: No    Attends Banker Meetings: Never    Marital Status: Married  Catering manager Violence: Not At Risk (07/20/2022)   Humiliation, Afraid, Rape, and Kick questionnaire    Fear of Current or Ex-Partner: No    Emotionally Abused: No    Physically Abused: No    Sexually Abused: No    PHYSICAL EXAM  There were no vitals filed for this visit. There is no height or weight on file to calculate BMI.  Generalized: Well developed, in no acute distress  Neurological examination  Mentation: Alert oriented to time, place, history taking. Follows all commands speech and language fluent Cranial nerve II-XII: Pupils were equal round reactive to light. Extraocular movements were full, visual field were full on confrontational test. Facial sensation and strength were normal. Head turning and shoulder shrug  were normal and symmetric. Motor: The motor testing reveals 5 over 5 strength of all 4 extremities. Good symmetric motor tone is noted throughout.    DIAGNOSTIC DATA (LABS, IMAGING, TESTING) - I reviewed patient records, labs, notes, testing and imaging myself where available.  Lab Results  Component Value Date   WBC 6.5 11/21/2022   HGB 14.3 11/21/2022   HCT 45.0 11/21/2022   MCV 92.4 11/21/2022   PLT 219 11/21/2022      Component Value Date/Time   NA 139 11/21/2022 1628   K 4.2 11/21/2022 1628   CL 102 11/21/2022 1628   CO2 31 11/21/2022 1628   GLUCOSE 179 (H) 11/21/2022 1628   BUN 21 11/21/2022 1628   CREATININE 0.69 11/21/2022 1628   CALCIUM 9.6 11/21/2022 1628   PROT 6.5 11/21/2022 1628   ALBUMIN 2.9 (L) 07/07/2022 0214   AST 23 11/21/2022 1628   ALT 29 11/21/2022 1628   ALKPHOS 37 (L) 07/07/2022 0214   BILITOT 0.3 11/21/2022 1628   GFRNONAA >60 07/08/2022 0039   GFRNONAA 85 04/06/2020 0940   GFRAA 99 04/06/2020 0940   Lab Results  Component Value Date   CHOL 113 11/21/2022   HDL 46  (L) 11/21/2022   LDLCALC 39 11/21/2022   TRIG 227 (H) 11/21/2022   CHOLHDL 2.5 11/21/2022   Lab Results  Component Value Date   HGBA1C 7.6 (H) 11/21/2022   Lab Results  Component Value Date   VITAMINB12 697 07/17/2013   Lab Results  Component Value Date   TSH 2.482 07/06/2022        No data to display               No data to display           ASSESSMENT AND PLAN  82 y.o. year old female  has a past medical history of Abnormal weight gain, Acute diverticulitis (05/13/2021), Arthritis, COPD (chronic obstructive pulmonary disease) (HCC), Coronary artery disease, non-occlusive (06/13/2013), Diverticulosis, GERD (gastroesophageal reflux disease), History of stress test, Hyperlipidemia, Hypertension, NIDDM (non-insulin dependent diabetes mellitus), On home oxygen therapy, OSA (obstructive sleep apnea),  and Tracheobronchomalacia. here with    Occipital neuralgia of left side - Plan: lidocaine (XYLOCAINE) 2 % (with pres) injection 30 mg, bupivacaine (MARCAINE) 0.25 % (with pres) injection 1.5 mL  Kristl P Przybylski continues to have occipital pain despite increased dose of gabapentin and PT. We have discussed occipital nerve block and she agrees to procedure.   Bupivicaine injection/lidocaine protocol for occipital neuralgia  Bupivacaine 0.5% and Betamethasone 30 mg/5cc 1:1 mixture was injected on the scalp left side at several locations:  -On the occipital area of the head, 3 injections, 1 cc per injection at the midpoint between the mastoid process and the occipital protuberance. 2 other injections were done one finger breadth from the initial injection, one at a 10 o'clock position and the other at a 2 o'clock position.  The patient tolerated the injections well, no complications of the procedure were noted. Injections were made with a 27-gauge needle.  Healthy lifestyle habits encouraged. She will follow up with PCP as directed. She will return to see me as needed. She  verbalizes understanding and agreement with this plan.   After procedure, provider was notified that bupivacaine was expired effective 09/14/2022. Patient made aware. Medication inspected for any signs of impurities and discarded per protocol. Patient will monitor for any adverse effects and contact me immediately for any concerns. She was instructed to seek emergency medical attention for any unusual or acute symptoms of chest pain, shob, changes in speech or unexpected numbness or sensory changes. She verbalizes understanding and agreement with this plan.    No orders of the defined types were placed in this encounter.    Meds ordered this encounter  Medications   lidocaine (XYLOCAINE) 2 % (with pres) injection 30 mg    Batch [id number] Exp 08/2024 NDC:55150-255-20   bupivacaine (MARCAINE) 0.25 % (with pres) injection 1.5 mL    Lot: [id number] Exp:09/14/2022 NDC:0409-1160-18     Shawnie Dapper, MSN, FNP-C 12/18/2022, 2:32 PM  [facility] [address], [address] [place], [place] [address] [phone]

## 2022-12-18 ENCOUNTER — Ambulatory Visit: Payer: Medicare Other | Admitting: Family Medicine

## 2022-12-18 DIAGNOSIS — M5481 Occipital neuralgia: Secondary | ICD-10-CM

## 2022-12-18 MED ORDER — LIDOCAINE HCL 2 % IJ SOLN
1.5000 mL | Freq: Once | INTRAMUSCULAR | Status: AC
Start: 2022-12-18 — End: ?

## 2022-12-18 MED ORDER — BUPIVACAINE HCL 0.25 % IJ SOLN
1.5000 mL | Freq: Once | INTRAMUSCULAR | Status: AC
Start: 2022-12-18 — End: ?

## 2022-12-18 NOTE — Progress Notes (Signed)
0.25% Bupivacaine 1.26mL  Lot: [id number] Exp:09/14/2022 NDC:0409-1160-18  Vial intact Medicine Clear  2% Lidocaine Batch [id number] 1.74mL Exp 08/2024  NDC:55150-255-20   Witnessed By, Shawnie Dapper, NP

## 2022-12-19 ENCOUNTER — Ambulatory Visit: Payer: Medicare Other | Attending: Adult Health | Admitting: Physical Therapy

## 2022-12-19 ENCOUNTER — Encounter: Payer: Self-pay | Admitting: Physical Therapy

## 2022-12-19 DIAGNOSIS — R519 Headache, unspecified: Secondary | ICD-10-CM

## 2022-12-19 DIAGNOSIS — M792 Neuralgia and neuritis, unspecified: Secondary | ICD-10-CM

## 2022-12-19 DIAGNOSIS — M542 Cervicalgia: Secondary | ICD-10-CM

## 2022-12-19 NOTE — Therapy (Signed)
OUTPATIENT PHYSICAL THERAPY TREATMENT    Patient Name: April [name] MRN: [id number] DOB:1940-12-15, 82 y.o., female Today's Date: 12/19/2022  END OF SESSION:  PT End of Session - 12/19/22 1400     Visit Number 12    Number of Visits 13    Date for PT Re-Evaluation 01/17/23    Authorization Type [employer] MEDICARE reporting period from 910/23/2024    Progress Note Due on Visit 10    PT Start Time 1348    PT Stop Time 1427    PT Time Calculation (min) 39 min    Activity Tolerance Patient tolerated treatment well;Patient limited by pain    Behavior During Therapy [facility] for tasks assessed/performed              Past Medical History:  Diagnosis Date   Abnormal weight gain    Acute diverticulitis 05/13/2021   Arthritis    "joints" (06/13/2013)   COPD (chronic obstructive pulmonary disease) (HCC)    Coronary artery disease, non-occlusive 06/13/2013   Cardiac Cath: sharp Angle take-off of Large Dominant Cx: ~70-80% mid Cx bifurcation lesion (Lateral OM &  AVGCx-PL-PDA both with hairpin ostial & ~50% lesions) - Not PCI amenable due to vessel tortuosity; ~40-50% mid LAD;Ramus - no significnat diseaes; small non-dominant RCA   Diverticulosis    GERD (gastroesophageal reflux disease)    History of stress test    a. 09/2006 nl Dobutamine Echo   Hyperlipidemia    Hypertension    NIDDM (non-insulin dependent diabetes mellitus)    On home oxygen therapy    "2L q hs; runs into my BIPAP" (06/13/2013)   OSA (obstructive sleep apnea)    "BIPAP w/O2" (06/13/2013)   Tracheobronchomalacia    a. followed by [facility]   Past Surgical History:  Procedure Laterality Date   CARDIAC CATHETERIZATION  06/2013   Cardiac Cath: sharp Angle take-off of Large Dominant Cx: ~70-80% mid Cx bifurcation lesion (Lateral OM &  AVGCx-PL-PDA both with hairpin ostial & ~50% lesions) - Not PCI amenable due to vessel tortuosity; ~40-50% mid LAD;Ramus - no significnat diseaes; small non-dominant RCA   LEFT HEART  CATHETERIZATION WITH CORONARY ANGIOGRAM N/A 06/16/2013   Procedure: LEFT HEART CATHETERIZATION WITH CORONARY ANGIOGRAM;  Surgeon: Marykay Lex, MD;  Location: [facility] CATH LAB;  Service: Cardiovascular;  Laterality: N/A;   TRANSTHORACIC ECHOCARDIOGRAM  06/17/2013   Normal LV size and function. EF 55-60% with no regional WMA. Grade 1 diastolic dysfunction. No significant valvular lesions   TUBAL LIGATION     Patient Active Problem List   Diagnosis Date Noted   Excessive body weight loss 11/19/2022   Acute metabolic encephalopathy 07/07/2022   Acute encephalopathy 07/06/2022   Nausea vomiting and diarrhea 07/06/2022   Altered mental status 07/05/2022   Falls 05/01/2022   Aortic stenosis, mild 04/20/2022   Pulmonary embolism (HCC) 04/18/2022   Cholelithiasis 04/18/2022   Wheezing 04/18/2022   Acute respiratory failure with hypoxia (HCC) 04/18/2022   Age-related osteoporosis with current pathological fracture, vertebra(e), initial encounter for fracture (HCC) 04/04/2022   Inflammation of sacroiliac joint (HCC) 04/04/2022   Polyneuropathy 04/04/2022   Subacute cough 03/28/2022   Pedal edema 11/04/2020   Scoliosis of lumbar spine 08/04/2020   Lumbar spondylosis 08/04/2020   Radiculopathy, lumbar region 05/19/2020   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) 07/01/2013   Hypothyroid 06/16/2013   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   Unstable angina - history of 06/13/2013   Hyperlipidemia associated with type 2 diabetes  mellitus (HCC)    GERD (gastroesophageal reflux disease)    Essential hypertension    Type 2 diabetes mellitus treated with insulin (HCC)    Dyspnea    Diverticulosis    HOARSENESS 06/30/2009   OSA treated with BiPAP 01/01/2007   WEIGHT GAIN 01/01/2007    PCP:  Donita Brooks, MD  REFERRING PROVIDER: Ocie Doyne, MD   REFERRING DIAG: cervicalgia  THERAPY DIAG:  Cervicalgia  Nonintractable headache, unspecified  chronicity pattern, unspecified headache type  Neuralgia and neuritis  Rationale for Evaluation and Treatment: Rehabilitation  ONSET DATE: Christmas season 2023.   PERTINENT HISTORY:  Patient is a 82 y.o. female who presents to outpatient physical therapy with a referral for medical diagnosis cervicalgia. This patient's chief complaints consist of left sided neck pain and stiffness and headache that is worst near the mastoid process and intermittently radiates to the top of her head, temple, and behind left eye, and left UE pain leading to the following functional deficits: difficulty feeling like "doing much of nothing," watching TV, being with family, crosswords, play solitaire on her phone, turning her head, flexing or extending neck, prolonged sitting, traveling. 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 She  has a past medical history of Abnormal weight gain, Acute diverticulitis (05/13/2021), Arthritis, COPD (chronic obstructive pulmonary disease) (HCC), Coronary artery disease,  non-occlusive (06/13/2013), Diverticulosis, GERD (gastroesophageal reflux disease), History of stress test, Hyperlipidemia, Hypertension, NIDDM (non-insulin dependent diabetes mellitus), OSA (obstructive sleep apnea), and Tracheobronchomalacia. She  has a past surgical history that includes Tubal ligation; Cardiac catheterization (06/2013); transthoracic echocardiogram (06/17/2013); and left heart catheterization with coronary angiogram (N/A, 06/16/2013).  Patient/DIL denies hx of cancer, stroke, seizures, unexplained weight loss, unexplained changes in bowel or bladder problems, unexplained stumbling or dropping things, and spinal surgery.  SUBJECTIVE:  SUBJECTIVE STATEMENT: Had injection yesterday (nerve block) on left side of head and doctor said it was a 50/50 chance. Patient reports it didn't do anything and she had a severe headache last night and this morning.  The other days were okay.  NP did injection and wants to do referral to neurology to make sure nothing in missing.  Caregiver says HEP is going okay and she does it multiple times per day.  She reports a lot of popping, which is painful.  PAIN:  NPRS: 5/10 behind left ear radiating to the top of the left side of her head and behind her left eye. L cheekbone is still "sore."  PRECAUTIONS: Fall  PATIENT GOALS: "want to get her head to quit hurting and would like to get my back to quit hurting"   NEXT MD VISIT: 6 months from the last visit  OBJECTIVE  TODAY'S TREATMENT: Therapeutic exercise: to centralize symptoms and improve ROM, strength, muscular endurance, and activity tolerance required for successful completion of functional activities.  - NuStep level 2 using bilateral upper and lower extremities. Seat/handle setting 7/9. For improved  extremity mobility, muscular endurance, and activity tolerance; and to induce the analgesic effect of aerobic exercise, stimulate improved joint nutrition, and prepare body structures and systems for following interventions. 5 minutes. Average SPM = 65.   - seated C-spine AROM assessment before/after manual - Bow and arrow row: 2x10 per side with black band - T-spine extention x10 per side  Manual therapy: to reduce pain and tissue tension, improve range of motion, neuromodulation, in order to promote improved ability to complete functional activities. HOOLYING - suboccipital release, 3x30-40 seconds - PROM with upper cervical spine flexion with contract-relax technique for suboccipital muscles, 3 cycles of 2-5 contract-release with 5 second holds.  - CPA to approx grade II-III to mid to lower segments, 10-30 seconds each segment. Feels hypomobile at C5 (improved after 30 second mob).   Pt required multimodal cuing for proper technique and to facilitate improved neuromuscular control, strength, range of motion, and functional ability resulting in improved performance and form.  PATIENT EDUCATION:  Education details: Exercise purpose/form. Self management techniques. POC, progress. Education on HEP including handout. Person educated: Patient and Caregiver DIL [name] Education method: Explanation, Demonstration, Tactile cues, Verbal cues, and Handouts Education comprehension: verbalized understanding, returned demonstration, and needs further education  HOME EXERCISE PROGRAM: Access Code: [email or web address] URL: [email or web address] Date: 11/30/2022 Prepared by: Norton Blizzard  Exercises - Supine Deep Neck Flexor Nods  - 1-2 x daily - 1 sets - 10 reps - 10 seconds hold - Sidelying Thoracic Rotation with Open Book  - 1 x daily - 1-2 sets - 10 reps - 5 seconds hold - Seated Cervical Retraction  - 1-2 x daily - 3 sets - 10 reps - 1 seconds hold - Seated Shoulder Row with Anchored  Resistance  - 1 x daily - 3 sets - 15 reps - Seated Assisted Cervical Rotation with Towel  - 1 x daily - 1-2 sets - 10 reps - 5 seconds hold  HOME EXERCISE PROGRAM [[email or web address]] View at [email or web address]" using code: [email or web address] Suboccipital Stretch -  Repeat 3 Repetitions, Hold 1 Minute, Complete 1 Set, Perform 1 Times a Day  ASSESSMENT:  CLINICAL IMPRESSION: Patient arrives with large bruise over left eye after unexpectedly stepping off the edge of a walkway in the dark at a friends house last night. Her neck and head pain did not seemed worsened today and there were no red flags for  serious injury after the fall. Today's session focused in gentle joint mobilizations to improve cervical spine mobility, since patient is demonstrating/complaining of painful popping. Mild reduction in popping following manual therapy and popping that was there was less painful. Plan to continue with manual therapy interventions and strengthening exercises as tolerated. Patient would benefit from continued management of limiting condition by skilled physical therapist to address remaining impairments and functional limitations to work towards stated goals and return to PLOF or maximal functional independence.   From Initial PT Evaluation 10/25/2022:  Patient is a 82 y.o. female referred to outpatient physical therapy with a medical diagnosis of cervicalgia who presents with signs and symptoms consistent with left sided neck pain with radiation to the left side of the head reproducing patient's headache complaint. Patient also has limited AROM especially into left rotation that is associated with the neck pain and headache and left arm discomfort possibly radiating from left sided neck. Patient presents with significant posture, joint stiffness, ROM, motor control, muscle tension, muscle performance (strength/power/endurance) and activity tolerance impairments that are limiting ability to complete usual activities such as  watching TV, being with family, crosswords, play solitaire on her phone, turning her head, flexing or extending neck, prolonged sitting, traveling without difficulty and making her feel like "doing much of nothing." Patient will benefit from skilled physical therapy intervention to address current body structure impairments and activity limitations to improve function and work towards goals set in current POC in order to return to prior level of function or maximal functional improvement.    OBJECTIVE IMPAIRMENTS: Abnormal gait, decreased activity tolerance, decreased cognition, decreased coordination, decreased endurance, decreased knowledge of condition, decreased mobility, difficulty walking, decreased ROM, decreased strength, hypomobility, impaired perceived functional ability, increased muscle spasms, impaired flexibility, impaired UE functional use, improper body mechanics, postural dysfunction, and pain.   ACTIVITY LIMITATIONS: carrying, lifting, sitting, sleeping, transfers, bed mobility, bathing, dressing, hygiene/grooming, and locomotion level  PARTICIPATION LIMITATIONS: meal prep, cleaning, interpersonal relationship, shopping, community activity, and   difficulty feeling like "doing much of nothing," watching TV, being with family, crosswords, play solitaire on her phone, turning her head, flexing or extending neck, prolonged sitting, traveling  PERSONAL FACTORS: Age, Fitness, Past/current experiences, Time since onset of injury/illness/exacerbation, and 3+ comorbidities:   OSA treated with BiPAP; WEIGHT GAIN; HOARSENESS; Hyperlipidemia associated with type 2 diabetes mellitus (HCC); GERD (gastroesophageal reflux disease); Essential hypertension; Type 2 diabetes mellitus treated with insulin (HCC); Dyspnea; Diverticulosis; Unstable angina - history of; Hypothyroid; Tracheobronchomalacia; 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); Pedal edema; Scoliosis of  lumbar spine; Lumbar spondylosis; Radiculopathy, lumbar region; Subacute cough; Age-related osteoporosis with current pathological fracture, vertebra(e), initial encounter for fracture (HCC); Inflammation of sacroiliac joint (HCC); Polyneuropathy; Acute pulmonary embolism (HCC); Cholelithiasis; Wheezing; Acute respiratory failure with hypoxia (HCC); Falls; Altered mental status; Acute encephalopathy; Nausea vomiting and diarrhea; and Acute metabolic encephalopathy on their problem list. She  has a past medical history of Abnormal weight gain, Acute diverticulitis (05/13/2021), Arthritis, COPD (chronic obstructive pulmonary disease) (HCC), Coronary artery disease, non-occlusive (06/13/2013), Diverticulosis, GERD (gastroesophageal reflux disease), History of stress test, Hyperlipidemia, Hypertension, NIDDM (non-insulin dependent diabetes mellitus), OSA (obstructive sleep apnea), and Tracheobronchomalacia. She  has a past surgical history that includes Tubal ligation; Cardiac catheterization (06/2013); transthoracic echocardiogram (06/17/2013); and left heart catheterization with coronary angiogram (N/A, 06/16/2013) are also affecting patient's functional outcome.   REHAB POTENTIAL: Good  CLINICAL DECISION MAKING: Evolving/moderate complexity  EVALUATION COMPLEXITY: Moderate   GOALS: Goals  reviewed with patient? No  SHORT TERM GOALS: Target date: 11/08/2022  Patient will be independent with initial home exercise program for self-management of symptoms. Baseline: Initial HEP provided at IE (10/25/22); Goal status: MET   LONG TERM GOALS: Target date: 01/17/2023  Patient will be independent with a long-term home exercise program for self-management of symptoms.  Baseline: Initial HEP provided at IE (10/25/22); participating fair - less after pneumonia (12/06/2022);  Goal status: In-progress  2.  Patient will demonstrate improved FOTO by equal or greater than 10 points by visit #10 to demonstrate  improvement in overall condition and self-reported functional ability.  Baseline: to be measured visit 2 as appropriate (10/25/22); 39 at visit #2 (10/30/2022); 52 at visit #10 (12/06/2022);  Goal status: MET (score of 52 on 12/06/22)    3.  Patient will improve cervical spine rotation to equal or greater than 60 degrees each direction without increased pain to improve ability to view her surroundings without difficulty.  Baseline: R/L 67/50 with concordant pain (10/25/22); R/L 50/50 with concordant pain (12/06/22); Goal status: Ongoing  4.  Patient will score equal or greater than 26 seconds on the Deep Neck Flexor Endurance Test to demonstrate improved neck strength/endurance and reach age matched norm in healthy women to improve her ability to complete her daily activities with less difficulty due to neck pain associated with fatigue.  Baseline: to be tested visit 2 as appropriate (10/25/22); Goal status: MET (48 seconds on 12/06/22)  5.  Patient will complete community, work and/or recreational activities with 50% less limitation due to current condition.  Baseline: difficulty feeling like "doing much of nothing," watching TV, being with family, crosswords, play solitaire on her phone, turning her head, flexing or extending neck, prolonged sitting, traveling (10/25/22); deferred due to patient having difficulty distinguishing from current symptoms and overall symptoms/function (12/06/2022);  Goal status: Ongoing   PLAN:  PT FREQUENCY: 1-2x/week  PT DURATION: 12 weeks  PLANNED INTERVENTIONS: Therapeutic exercises, Therapeutic activity, Neuromuscular re-education, Patient/Family education, Self Care, DME instructions, Dry Needling, Electrical stimulation, Cryotherapy, Moist heat, Manual therapy, and Re-evaluation  PLAN FOR NEXT SESSION: update HEP as appropriate, progressive postural/UE/functional strengthening, motor control, and ROM exercises as appropriate. Education. Manual therapy as  needed.    Luretha Murphy. Ilsa Iha, PT, DPT 12/19/22, 2:16 PM  [facility] [address] [place], [place] [address] P: [phone] I F: [phone]

## 2022-12-21 ENCOUNTER — Ambulatory Visit: Payer: Medicare Other | Admitting: Physical Therapy

## 2022-12-21 ENCOUNTER — Encounter: Payer: Self-pay | Admitting: Physical Therapy

## 2022-12-21 DIAGNOSIS — M542 Cervicalgia: Secondary | ICD-10-CM

## 2022-12-21 DIAGNOSIS — M792 Neuralgia and neuritis, unspecified: Secondary | ICD-10-CM

## 2022-12-21 DIAGNOSIS — R519 Headache, unspecified: Secondary | ICD-10-CM

## 2022-12-21 NOTE — Therapy (Signed)
OUTPATIENT PHYSICAL THERAPY TREATMENT    Patient Name: QUANISHA DREWRY MRN: [id number] DOB:1940-11-27, 82 y.o., female Today's Date: 12/21/2022  END OF SESSION:  PT End of Session - 12/21/22 1408     Visit Number 13    Number of Visits 20    Date for PT Re-Evaluation 01/17/23    Authorization Type [employer] MEDICARE reporting period from 910/23/2024    Progress Note Due on Visit 20    PT Start Time 1347    PT Stop Time 1425    PT Time Calculation (min) 38 min    Activity Tolerance Patient tolerated treatment well;Patient limited by pain    Behavior During Therapy [facility] for tasks assessed/performed               Past Medical History:  Diagnosis Date   Abnormal weight gain    Acute diverticulitis 05/13/2021   Arthritis    "joints" (06/13/2013)   COPD (chronic obstructive pulmonary disease) (HCC)    Coronary artery disease, non-occlusive 06/13/2013   Cardiac Cath: sharp Angle take-off of Large Dominant Cx: ~70-80% mid Cx bifurcation lesion (Lateral OM &  AVGCx-PL-PDA both with hairpin ostial & ~50% lesions) - Not PCI amenable due to vessel tortuosity; ~40-50% mid LAD;Ramus - no significnat diseaes; small non-dominant RCA   Diverticulosis    GERD (gastroesophageal reflux disease)    History of stress test    a. 09/2006 nl Dobutamine Echo   Hyperlipidemia    Hypertension    NIDDM (non-insulin dependent diabetes mellitus)    On home oxygen therapy    "2L q hs; runs into my BIPAP" (06/13/2013)   OSA (obstructive sleep apnea)    "BIPAP w/O2" (06/13/2013)   Tracheobronchomalacia    a. followed by [facility]   Past Surgical History:  Procedure Laterality Date   CARDIAC CATHETERIZATION  06/2013   Cardiac Cath: sharp Angle take-off of Large Dominant Cx: ~70-80% mid Cx bifurcation lesion (Lateral OM &  AVGCx-PL-PDA both with hairpin ostial & ~50% lesions) - Not PCI amenable due to vessel tortuosity; ~40-50% mid LAD;Ramus - no significnat diseaes; small non-dominant RCA   LEFT HEART  CATHETERIZATION WITH CORONARY ANGIOGRAM N/A 06/16/2013   Procedure: LEFT HEART CATHETERIZATION WITH CORONARY ANGIOGRAM;  Surgeon: Marykay Lex, MD;  Location: [facility] CATH LAB;  Service: Cardiovascular;  Laterality: N/A;   TRANSTHORACIC ECHOCARDIOGRAM  06/17/2013   Normal LV size and function. EF 55-60% with no regional WMA. Grade 1 diastolic dysfunction. No significant valvular lesions   TUBAL LIGATION     Patient Active Problem List   Diagnosis Date Noted   Excessive body weight loss 11/19/2022   Acute metabolic encephalopathy 07/07/2022   Acute encephalopathy 07/06/2022   Nausea vomiting and diarrhea 07/06/2022   Altered mental status 07/05/2022   Falls 05/01/2022   Aortic stenosis, mild 04/20/2022   Pulmonary embolism (HCC) 04/18/2022   Cholelithiasis 04/18/2022   Wheezing 04/18/2022   Acute respiratory failure with hypoxia (HCC) 04/18/2022   Age-related osteoporosis with current pathological fracture, vertebra(e), initial encounter for fracture (HCC) 04/04/2022   Inflammation of sacroiliac joint (HCC) 04/04/2022   Polyneuropathy 04/04/2022   Subacute cough 03/28/2022   Pedal edema 11/04/2020   Scoliosis of lumbar spine 08/04/2020   Lumbar spondylosis 08/04/2020   Radiculopathy, lumbar region 05/19/2020   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) 07/01/2013   Hypothyroid 06/16/2013   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   Unstable angina - history of 06/13/2013   Hyperlipidemia associated with type 2  diabetes mellitus (HCC)    GERD (gastroesophageal reflux disease)    Essential hypertension    Type 2 diabetes mellitus treated with insulin (HCC)    Dyspnea    Diverticulosis    HOARSENESS 06/30/2009   OSA treated with BiPAP 01/01/2007   WEIGHT GAIN 01/01/2007    PCP:  Donita Brooks, MD  REFERRING PROVIDER: Ocie Doyne, MD   REFERRING DIAG: cervicalgia  THERAPY DIAG:  Cervicalgia  Nonintractable headache, unspecified  chronicity pattern, unspecified headache type  Neuralgia and neuritis  Rationale for Evaluation and Treatment: Rehabilitation  ONSET DATE: Christmas season 2023.   PERTINENT HISTORY:  Patient is a 82 y.o. female who presents to outpatient physical therapy with a referral for medical diagnosis cervicalgia. This patient's chief complaints consist of left sided neck pain and stiffness and headache that is worst near the mastoid process and intermittently radiates to the top of her head, temple, and behind left eye, and left UE pain leading to the following functional deficits: difficulty feeling like "doing much of nothing," watching TV, being with family, crosswords, play solitaire on her phone, turning her head, flexing or extending neck, prolonged sitting, traveling. 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 She  has a past medical history of Abnormal weight gain, Acute diverticulitis (05/13/2021), Arthritis, COPD (chronic obstructive pulmonary disease) (HCC), Coronary artery disease,  non-occlusive (06/13/2013), Diverticulosis, GERD (gastroesophageal reflux disease), History of stress test, Hyperlipidemia, Hypertension, NIDDM (non-insulin dependent diabetes mellitus), OSA (obstructive sleep apnea), and Tracheobronchomalacia. She  has a past surgical history that includes Tubal ligation; Cardiac catheterization (06/2013); transthoracic echocardiogram (06/17/2013); and left heart catheterization with coronary angiogram (N/A, 06/16/2013).  Patient/DIL denies hx of cancer, stroke, seizures, unexplained weight loss, unexplained changes in bowel or bladder problems, unexplained stumbling or dropping things, and spinal surgery.  SUBJECTIVE:  SUBJECTIVE STATEMENT: Patient arrives with [facility]. She states she feels like her pain has gotten worse since the nerve block. She states her pain continues to be behind her left ear and up to her head.   PAIN:  NPRS: 6/10 behind left ear radiating to the top of the left side of her head   PRECAUTIONS: Fall  PATIENT GOALS: "want to get her head to quit hurting and would like to get my back to quit hurting"   NEXT MD VISIT: 6 months from the last visit  OBJECTIVE  TODAY'S TREATMENT: Therapeutic exercise: to centralize symptoms and improve ROM, strength, muscular endurance, and activity tolerance required for successful completion of functional activities.  - NuStep level 3 using bilateral upper and lower extremities. Seat/handle setting 8/10. For improved extremity mobility, muscular endurance, and activity tolerance; and to induce the analgesic effect of aerobic exercise, stimulate improved joint nutrition, and prepare body structures and systems for following interventions. 6 minutes. - seated T-spine extension over towel roll in green chair with hands  behind head and pec stretch, 3x10.  - sit <> stand from 18 inch chair with airex+[id number] pad on it, moving 2kg med ball from between knees to over head, 2x10, difficult. Reports pain during in left shoulder but no worse afterwards.  - seated cervical rotation SNAG, 1x10 each direction with pillow cae.  - seated (on green theraball) Bow and arrow row with ipsilateral cervical spine rotation: 3x10 per side with black band - ambulation approx 100 feet with SPC and SBA to meet DIL [name] at vehicle.   Pt required multimodal cuing for proper technique and to facilitate improved neuromuscular control, strength, range of motion, and functional ability resulting in improved performance and form.  PATIENT EDUCATION:  Education details: Exercise purpose/form. Self management techniques. POC, progress. Education on HEP including handout. Person educated: Patient and Caregiver DIL [name] Education method: Explanation, Demonstration, Tactile cues, Verbal cues, and Handouts Education comprehension: verbalized understanding, returned demonstration, and needs further education  HOME EXERCISE PROGRAM: Access Code: [email or web address] URL: [email or web address] Date: 11/30/2022 Prepared by: Norton Blizzard  Exercises - Supine Deep Neck Flexor Nods  - 1-2 x daily - 1 sets - 10 reps - 10 seconds hold - Sidelying Thoracic Rotation with Open Book  - 1 x daily - 1-2 sets - 10 reps - 5 seconds hold - Seated Cervical Retraction  - 1-2 x daily - 3 sets - 10 reps - 1 seconds hold - Seated Shoulder Row with Anchored Resistance  - 1 x daily - 3 sets - 15 reps - Seated Assisted Cervical Rotation with Towel  - 1 x daily - 1-2 sets - 10 reps - 5 seconds hold  HOME EXERCISE PROGRAM [HAMDPVB] View at [email or web address]" using code: [email or web address] Suboccipital Stretch -  Repeat 3 Repetitions, Hold 1 Minute, Complete 1 Set, Perform 1 Times a Day  ASSESSMENT:  CLINICAL IMPRESSION: Patient arrives reporting unchanged to slightly  worse concordant pain upon arrival. She continues to report painful popping.  Today's session focused on exercises to improve her postural strength and endurance. She tolerated them with mild left shoulder pain during overhead movement that resolved with rest. She reported mild increase in concordant pain by end of session. She demonstrated quick fatigue with exercises that required upright posture, but continued to participate well. Her significant thoracic kyphosis and poor posture is likely contributing to her pain, as well as low muscular strength and endurance. Plan to incorporate more manual/soft tissue intervention next session for pain relief while  continuing to work on improved postural strength and endurance. Patient would benefit from continued management of limiting condition by skilled physical therapist to address remaining impairments and functional limitations to work towards stated goals and return to PLOF or maximal functional independence.   From Initial PT Evaluation 10/25/2022:  Patient is a 82 y.o. female referred to outpatient physical therapy with a medical diagnosis of cervicalgia who presents with signs and symptoms consistent with left sided neck pain with radiation to the left side of the head reproducing patient's headache complaint. Patient also has limited AROM especially into left rotation that is associated with the neck pain and headache and left arm discomfort possibly radiating from left sided neck. Patient presents with significant posture, joint stiffness, ROM, motor control, muscle tension, muscle performance (strength/power/endurance) and activity tolerance impairments that are limiting ability to complete usual activities such as watching TV, being with family, crosswords, play solitaire on her phone, turning her head, flexing or extending neck, prolonged sitting, traveling without difficulty and making her feel like "doing much of nothing." Patient will benefit from  skilled physical therapy intervention to address current body structure impairments and activity limitations to improve function and work towards goals set in current POC in order to return to prior level of function or maximal functional improvement.    OBJECTIVE IMPAIRMENTS: Abnormal gait, decreased activity tolerance, decreased cognition, decreased coordination, decreased endurance, decreased knowledge of condition, decreased mobility, difficulty walking, decreased ROM, decreased strength, hypomobility, impaired perceived functional ability, increased muscle spasms, impaired flexibility, impaired UE functional use, improper body mechanics, postural dysfunction, and pain.   ACTIVITY LIMITATIONS: carrying, lifting, sitting, sleeping, transfers, bed mobility, bathing, dressing, hygiene/grooming, and locomotion level  PARTICIPATION LIMITATIONS: meal prep, cleaning, interpersonal relationship, shopping, community activity, and   difficulty feeling like "doing much of nothing," watching TV, being with family, crosswords, play solitaire on her phone, turning her head, flexing or extending neck, prolonged sitting, traveling  PERSONAL FACTORS: Age, Fitness, Past/current experiences, Time since onset of injury/illness/exacerbation, and 3+ comorbidities:   OSA treated with BiPAP; WEIGHT GAIN; HOARSENESS; Hyperlipidemia associated with type 2 diabetes mellitus (HCC); GERD (gastroesophageal reflux disease); Essential hypertension; Type 2 diabetes mellitus treated with insulin (HCC); Dyspnea; Diverticulosis; Unstable angina - history of; Hypothyroid; Tracheobronchomalacia; 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); Pedal edema; Scoliosis of lumbar spine; Lumbar spondylosis; Radiculopathy, lumbar region; Subacute cough; Age-related osteoporosis with current pathological fracture, vertebra(e), initial encounter for fracture (HCC); Inflammation of sacroiliac joint (HCC);  Polyneuropathy; Acute pulmonary embolism (HCC); Cholelithiasis; Wheezing; Acute respiratory failure with hypoxia (HCC); Falls; Altered mental status; Acute encephalopathy; Nausea vomiting and diarrhea; and Acute metabolic encephalopathy on their problem list. She  has a past medical history of Abnormal weight gain, Acute diverticulitis (05/13/2021), Arthritis, COPD (chronic obstructive pulmonary disease) (HCC), Coronary artery disease, non-occlusive (06/13/2013), Diverticulosis, GERD (gastroesophageal reflux disease), History of stress test, Hyperlipidemia, Hypertension, NIDDM (non-insulin dependent diabetes mellitus), OSA (obstructive sleep apnea), and Tracheobronchomalacia. She  has a past surgical history that includes Tubal ligation; Cardiac catheterization (06/2013); transthoracic echocardiogram (06/17/2013); and left heart catheterization with coronary angiogram (N/A, 06/16/2013) are also affecting patient's functional outcome.   REHAB POTENTIAL: Good  CLINICAL DECISION MAKING: Evolving/moderate complexity  EVALUATION COMPLEXITY: Moderate   GOALS: Goals reviewed with patient? No  SHORT TERM GOALS: Target date: 11/08/2022  Patient will be independent with initial home exercise program for self-management of symptoms. Baseline: Initial HEP provided at IE (10/25/22); Goal status: MET   LONG TERM GOALS: Target date:  01/17/2023  Patient will be independent with a long-term home exercise program for self-management of symptoms.  Baseline: Initial HEP provided at IE (10/25/22); participating fair - less after pneumonia (12/06/2022);  Goal status: In-progress  2.  Patient will demonstrate improved FOTO by equal or greater than 10 points by visit #10 to demonstrate improvement in overall condition and self-reported functional ability.  Baseline: to be measured visit 2 as appropriate (10/25/22); 39 at visit #2 (10/30/2022); 52 at visit #10 (12/06/2022);  Goal status: MET (score of 52 on  12/06/22)    3.  Patient will improve cervical spine rotation to equal or greater than 60 degrees each direction without increased pain to improve ability to view her surroundings without difficulty.  Baseline: R/L 67/50 with concordant pain (10/25/22); R/L 50/50 with concordant pain (12/06/22); Goal status: Ongoing  4.  Patient will score equal or greater than 26 seconds on the Deep Neck Flexor Endurance Test to demonstrate improved neck strength/endurance and reach age matched norm in healthy women to improve her ability to complete her daily activities with less difficulty due to neck pain associated with fatigue.  Baseline: to be tested visit 2 as appropriate (10/25/22); Goal status: MET (48 seconds on 12/06/22)  5.  Patient will complete community, work and/or recreational activities with 50% less limitation due to current condition.  Baseline: difficulty feeling like "doing much of nothing," watching TV, being with family, crosswords, play solitaire on her phone, turning her head, flexing or extending neck, prolonged sitting, traveling (10/25/22); deferred due to patient having difficulty distinguishing from current symptoms and overall symptoms/function (12/06/2022);  Goal status: Ongoing   PLAN:  PT FREQUENCY: 1-2x/week  PT DURATION: 12 weeks  PLANNED INTERVENTIONS: Therapeutic exercises, Therapeutic activity, Neuromuscular re-education, Patient/Family education, Self Care, DME instructions, Dry Needling, Electrical stimulation, Cryotherapy, Moist heat, Manual therapy, and Re-evaluation  PLAN FOR NEXT SESSION: update HEP as appropriate, progressive postural/UE/functional strengthening, motor control, and ROM exercises as appropriate. Education. Manual therapy as needed.    Luretha Murphy. Ilsa Iha, PT, DPT 12/21/22, 7:51 PM  [facility] [address] [place], [place] [address] P: [phone] I F: [phone]

## 2022-12-22 DIAGNOSIS — J9601 Acute respiratory failure with hypoxia: Secondary | ICD-10-CM | POA: Diagnosis not present

## 2022-12-25 ENCOUNTER — Ambulatory Visit: Payer: Medicare Other | Admitting: Physical Therapy

## 2022-12-25 DIAGNOSIS — M542 Cervicalgia: Secondary | ICD-10-CM

## 2022-12-25 DIAGNOSIS — M792 Neuralgia and neuritis, unspecified: Secondary | ICD-10-CM

## 2022-12-25 DIAGNOSIS — R519 Headache, unspecified: Secondary | ICD-10-CM | POA: Diagnosis not present

## 2022-12-25 NOTE — Therapy (Signed)
OUTPATIENT PHYSICAL THERAPY TREATMENT    Patient Name: LATRENA NICODEMUS MRN: [id number] DOB:[birth year], 82 y.o., female Today's Date: 12/26/2022  END OF SESSION:  PT End of Session - 12/25/22 1409     Visit Number 14    Number of Visits 20    Date for PT Re-Evaluation 01/17/23    Authorization Type [employer] MEDICARE reporting period from 910/23/2024    Progress Note Due on Visit 20    PT Start Time 1347    PT Stop Time 1425    PT Time Calculation (min) 38 min    Activity Tolerance Patient tolerated treatment well;Patient limited by pain;Patient limited by fatigue    Behavior During Therapy [facility] for tasks assessed/performed                Past Medical History:  Diagnosis Date   Abnormal weight gain    Acute diverticulitis 05/13/2021   Arthritis    "joints" (06/13/2013)   COPD (chronic obstructive pulmonary disease) (HCC)    Coronary artery disease, non-occlusive 06/13/2013   Cardiac Cath: sharp Angle take-off of Large Dominant Cx: ~70-80% mid Cx bifurcation lesion (Lateral OM &  AVGCx-PL-PDA both with hairpin ostial & ~50% lesions) - Not PCI amenable due to vessel tortuosity; ~40-50% mid LAD;Ramus - no significnat diseaes; small non-dominant RCA   Diverticulosis    GERD (gastroesophageal reflux disease)    History of stress test    a. 09/2006 nl Dobutamine Echo   Hyperlipidemia    Hypertension    NIDDM (non-insulin dependent diabetes mellitus)    On home oxygen therapy    "2L q hs; runs into my BIPAP" (06/13/2013)   OSA (obstructive sleep apnea)    "BIPAP w/O2" (06/13/2013)   Tracheobronchomalacia    a. followed by [facility].   Past Surgical History:  Procedure Laterality Date   CARDIAC CATHETERIZATION  06/2013   Cardiac Cath: sharp Angle take-off of Large Dominant Cx: ~70-80% mid Cx bifurcation lesion (Lateral OM &  AVGCx-PL-PDA both with hairpin ostial & ~50% lesions) - Not PCI amenable due to vessel tortuosity; ~40-50% mid LAD;Ramus - no significnat diseaes; small  non-dominant RCA   LEFT HEART CATHETERIZATION WITH CORONARY ANGIOGRAM N/A 06/16/2013   Procedure: LEFT HEART CATHETERIZATION WITH CORONARY ANGIOGRAM;  Surgeon: Marykay Lex, MD;  Location: [facility] CATH LAB;  Service: Cardiovascular;  Laterality: N/A;   TRANSTHORACIC ECHOCARDIOGRAM  06/17/2013   Normal LV size and function. EF 55-60% with no regional WMA. Grade 1 diastolic dysfunction. No significant valvular lesions   TUBAL LIGATION     Patient Active Problem List   Diagnosis Date Noted   Excessive body weight loss 11/19/2022   Acute metabolic encephalopathy 07/07/2022   Acute encephalopathy 07/06/2022   Nausea vomiting and diarrhea 07/06/2022   Altered mental status 07/05/2022   Falls 05/01/2022   Aortic stenosis, mild 04/20/2022   Pulmonary embolism (HCC) 04/18/2022   Cholelithiasis 04/18/2022   Wheezing 04/18/2022   Acute respiratory failure with hypoxia (HCC) 04/18/2022   Age-related osteoporosis with current pathological fracture, vertebra(e), initial encounter for fracture (HCC) 04/04/2022   Inflammation of sacroiliac joint (HCC) 04/04/2022   Polyneuropathy 04/04/2022   Subacute cough 03/28/2022   Pedal edema 11/04/2020   Scoliosis of lumbar spine 08/04/2020   Lumbar spondylosis 08/04/2020   Radiculopathy, lumbar region 05/19/2020   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) 07/01/2013   Hypothyroid 06/16/2013   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   Unstable angina - history of 06/13/2013   Hyperlipidemia  associated with type 2 diabetes mellitus (HCC)    GERD (gastroesophageal reflux disease)    Essential hypertension    Type 2 diabetes mellitus treated with insulin (HCC)    Dyspnea    Diverticulosis    HOARSENESS 06/30/2009   OSA treated with BiPAP 01/01/2007   WEIGHT GAIN 01/01/2007    PCP:  Donita Brooks, MD  REFERRING PROVIDER: Ocie Doyne, MD   REFERRING DIAG: cervicalgia  THERAPY DIAG:   Cervicalgia  Nonintractable headache, unspecified chronicity pattern, unspecified headache type  Neuralgia and neuritis  Rationale for Evaluation and Treatment: Rehabilitation  ONSET DATE: Christmas season 2023.   PERTINENT HISTORY:  Patient is a 82 y.o. female who presents to outpatient physical therapy with a referral for medical diagnosis cervicalgia. This patient's chief complaints consist of left sided neck pain and stiffness and headache that is worst near the mastoid process and intermittently radiates to the top of her head, temple, and behind left eye, and left UE pain leading to the following functional deficits: difficulty feeling like "doing much of nothing," watching TV, being with family, crosswords, play solitaire on her phone, turning her head, flexing or extending neck, prolonged sitting, traveling. 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 She  has a past medical history of Abnormal weight gain, Acute diverticulitis (05/13/2021), Arthritis, COPD (chronic  obstructive pulmonary disease) (HCC), Coronary artery disease, non-occlusive (06/13/2013), Diverticulosis, GERD (gastroesophageal reflux disease), History of stress test, Hyperlipidemia, Hypertension, NIDDM (non-insulin dependent diabetes mellitus), OSA (obstructive sleep apnea), and Tracheobronchomalacia. She  has a past surgical history that includes Tubal ligation; Cardiac catheterization (06/2013); transthoracic echocardiogram (06/17/2013); and left heart catheterization with coronary angiogram (N/A, 06/16/2013).  Patient/DIL denies hx of cancer, stroke, seizures, unexplained weight loss, unexplained changes in bowel or bladder problems, unexplained stumbling or dropping things, and spinal surgery.  SUBJECTIVE:  SUBJECTIVE STATEMENT: Patient arrives with [facility]. She states her neck still hurts. She states she  cannot remember how it felt after last PT session. She continues to report painful popping when she turns her head.   PAIN:  NPRS: 5/10 behind left ear radiating to the top of the left side of her head   PRECAUTIONS: Fall  PATIENT GOALS: "want to get her head to quit hurting and would like to get my back to quit hurting"   NEXT MD VISIT: 6 months from the last visit  OBJECTIVE  TODAY'S TREATMENT: Manual therapy: to reduce pain and tissue tension, improve range of motion, neuromodulation, in order to promote improved ability to complete functional activities. HOOKLYING - STM to suboccipital region, focusing on suboccipital release at left side (reproduces concordant pain).  - Contract-relax PROM stretch for left suboccipitals, 2x6 contract/relax 5/5 seconds.  - cervical rotation isometric/MET, 1x10 each side with 5 second hold, manual resistance and assistance holding in upper cervical flexion.    Therapeutic exercise: to centralize symptoms and improve ROM, strength, muscular endurance, and activity tolerance required for successful completion of functional activities.  - hooklying chin tuck to lift, 1x10 seconds - hooklying B shoulder abduction with scapular retraction, 3x10 with BlackTB (cuing for scapular retraction, posterior tilt, depression) - hooklying D2 flexion with BlackTB, 3x10 each side (cuing for scapular retraction, posterior tilt, depression).  - ambulation approx 100 feet with SPC and SBA to meet son at vehicle.   Pt required multimodal cuing for proper technique and to facilitate improved neuromuscular control, strength, range of motion, and functional ability resulting in improved performance and form.  PATIENT EDUCATION:  Education details: Exercise purpose/form. Self management techniques. POC, progress. Education on HEP including handout. Person educated: Patient and Caregiver DIL [name] Education method: Explanation, Demonstration, Tactile cues, Verbal cues, and Handouts Education comprehension: verbalized understanding, returned demonstration, and needs further education  HOME EXERCISE PROGRAM: Access Code: [email or web address] URL: [email or web address] Date: 12/25/2022 Prepared by: Norton Blizzard  Exercises - Seated Cervical Retraction  - 1 x daily - 3 sets - 10 reps - 1 seconds hold - Seated Assisted Cervical Rotation with Towel  - 1 x daily - 2 sets - 10 reps - 5 seconds hold - Sidelying Thoracic Rotation with Open Book  - 1 x daily - 1-2 sets - 10 reps - 5 seconds hold - Supine Deep Neck Flexor Training - Repetitions  - 3-4 x weekly - 3 sets - 10 reps - Supine Shoulder Horizontal Abduction with Resistance  - 3-4 x weekly - 3 sets - 10 reps - Supine PNF D2 Flexion with Resistance  - 3-4 x weekly - 3 sets - 10 reps - Staggered Stance Scapular Retraction and Depression with Resistance  - 1 x daily - 3-4 x weekly - 3 sets - 10  reps  ASSESSMENT:  CLINICAL IMPRESSION: Patient arrives reporting continued concordant pain. Today's session focused on manual therapy to decrease tension in left suboccipital region with improved pain following. Updates were also made to postural strengthening in supine and standing without exacerbation of pain and these were added to HEP. Patient does appear to get at least temporary relief from manual interventions, but likely needs improved postural strength for long term improvement. Pain is likely related to severe forward head posture as well as spasming of muscles that are under-trained and deconditioned and do not have enough strength or endurance to support patient's desired postures during her daily life. Patient would benefit from continued management of limiting  condition by skilled physical therapist to address remaining impairments and functional limitations to work towards stated goals and return to PLOF or maximal functional independence.   From Initial PT Evaluation 10/25/2022:  Patient is a 82 y.o. female referred to outpatient physical therapy with a medical diagnosis of cervicalgia who presents with signs and symptoms consistent with left sided neck pain with radiation to the left side of the head reproducing patient's headache complaint. Patient also has limited AROM especially into left rotation that is associated with the neck pain and headache and left arm discomfort possibly radiating from left sided neck. Patient presents with significant posture, joint stiffness, ROM, motor control, muscle tension, muscle performance (strength/power/endurance) and activity tolerance impairments that are limiting ability to complete usual activities such as watching TV, being with family, crosswords, play solitaire on her phone, turning her head, flexing or extending neck, prolonged sitting, traveling without difficulty and making her feel like "doing much of nothing." Patient will benefit from  skilled physical therapy intervention to address current body structure impairments and activity limitations to improve function and work towards goals set in current POC in order to return to prior level of function or maximal functional improvement.    OBJECTIVE IMPAIRMENTS: Abnormal gait, decreased activity tolerance, decreased cognition, decreased coordination, decreased endurance, decreased knowledge of condition, decreased mobility, difficulty walking, decreased ROM, decreased strength, hypomobility, impaired perceived functional ability, increased muscle spasms, impaired flexibility, impaired UE functional use, improper body mechanics, postural dysfunction, and pain.   ACTIVITY LIMITATIONS: carrying, lifting, sitting, sleeping, transfers, bed mobility, bathing, dressing, hygiene/grooming, and locomotion level  PARTICIPATION LIMITATIONS: meal prep, cleaning, interpersonal relationship, shopping, community activity, and   difficulty feeling like "doing much of nothing," watching TV, being with family, crosswords, play solitaire on her phone, turning her head, flexing or extending neck, prolonged sitting, traveling  PERSONAL FACTORS: Age, Fitness, Past/current experiences, Time since onset of injury/illness/exacerbation, and 3+ comorbidities:   OSA treated with BiPAP; WEIGHT GAIN; HOARSENESS; Hyperlipidemia associated with type 2 diabetes mellitus (HCC); GERD (gastroesophageal reflux disease); Essential hypertension; Type 2 diabetes mellitus treated with insulin (HCC); Dyspnea; Diverticulosis; Unstable angina - history of; Hypothyroid; Tracheobronchomalacia; 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); Pedal edema; Scoliosis of lumbar spine; Lumbar spondylosis; Radiculopathy, lumbar region; Subacute cough; Age-related osteoporosis with current pathological fracture, vertebra(e), initial encounter for fracture (HCC); Inflammation of sacroiliac joint (HCC);  Polyneuropathy; Acute pulmonary embolism (HCC); Cholelithiasis; Wheezing; Acute respiratory failure with hypoxia (HCC); Falls; Altered mental status; Acute encephalopathy; Nausea vomiting and diarrhea; and Acute metabolic encephalopathy on their problem list. She  has a past medical history of Abnormal weight gain, Acute diverticulitis (05/13/2021), Arthritis, COPD (chronic obstructive pulmonary disease) (HCC), Coronary artery disease, non-occlusive (06/13/2013), Diverticulosis, GERD (gastroesophageal reflux disease), History of stress test, Hyperlipidemia, Hypertension, NIDDM (non-insulin dependent diabetes mellitus), OSA (obstructive sleep apnea), and Tracheobronchomalacia. She  has a past surgical history that includes Tubal ligation; Cardiac catheterization (06/2013); transthoracic echocardiogram (06/17/2013); and left heart catheterization with coronary angiogram (N/A, 06/16/2013) are also affecting patient's functional outcome.   REHAB POTENTIAL: Good  CLINICAL DECISION MAKING: Evolving/moderate complexity  EVALUATION COMPLEXITY: Moderate   GOALS: Goals reviewed with patient? No  SHORT TERM GOALS: Target date: 11/08/2022  Patient will be independent with initial home exercise program for self-management of symptoms. Baseline: Initial HEP provided at IE (10/25/22); Goal status: MET   LONG TERM GOALS: Target date: 01/17/2023  Patient will be independent with a long-term home exercise program for self-management of symptoms.  Baseline: Initial HEP provided at IE (10/25/22); participating fair - less after pneumonia (12/06/2022);  Goal status: In-progress  2.  Patient will demonstrate improved FOTO by equal or greater than 10 points by visit #10 to demonstrate improvement in overall condition and self-reported functional ability.  Baseline: to be measured visit 2 as appropriate (10/25/22); 39 at visit #2 (10/30/2022); 52 at visit #10 (12/06/2022);  Goal status: MET (score of 52 on  12/06/22)    3.  Patient will improve cervical spine rotation to equal or greater than 60 degrees each direction without increased pain to improve ability to view her surroundings without difficulty.  Baseline: R/L 67/50 with concordant pain (10/25/22); R/L 50/50 with concordant pain (12/06/22); Goal status: Ongoing  4.  Patient will score equal or greater than 26 seconds on the Deep Neck Flexor Endurance Test to demonstrate improved neck strength/endurance and reach age matched norm in healthy women to improve her ability to complete her daily activities with less difficulty due to neck pain associated with fatigue.  Baseline: to be tested visit 2 as appropriate (10/25/22); Goal status: MET (48 seconds on 12/06/22)  5.  Patient will complete community, work and/or recreational activities with 50% less limitation due to current condition.  Baseline: difficulty feeling like "doing much of nothing," watching TV, being with family, crosswords, play solitaire on her phone, turning her head, flexing or extending neck, prolonged sitting, traveling (10/25/22); deferred due to patient having difficulty distinguishing from current symptoms and overall symptoms/function (12/06/2022);  Goal status: Ongoing   PLAN:  PT FREQUENCY: 1-2x/week  PT DURATION: 12 weeks  PLANNED INTERVENTIONS: Therapeutic exercises, Therapeutic activity, Neuromuscular re-education, Patient/Family education, Self Care, DME instructions, Dry Needling, Electrical stimulation, Cryotherapy, Moist heat, Manual therapy, and Re-evaluation  PLAN FOR NEXT SESSION: update HEP as appropriate, progressive postural/UE/functional strengthening, motor control, and ROM exercises as appropriate. Education. Manual therapy as needed.    Luretha Murphy. Ilsa Iha, PT, DPT 12/26/22, 8:42 PM  [facility] [address] [place], [place] [address] P: [phone] I F: [phone]

## 2022-12-26 ENCOUNTER — Encounter: Payer: Self-pay | Admitting: Physical Therapy

## 2022-12-27 ENCOUNTER — Encounter (HOSPITAL_BASED_OUTPATIENT_CLINIC_OR_DEPARTMENT_OTHER): Payer: Self-pay | Admitting: Pulmonary Disease

## 2022-12-27 ENCOUNTER — Ambulatory Visit (HOSPITAL_BASED_OUTPATIENT_CLINIC_OR_DEPARTMENT_OTHER): Payer: Medicare Other | Admitting: Pulmonary Disease

## 2022-12-27 ENCOUNTER — Ambulatory Visit: Payer: Medicare Other | Admitting: Physical Therapy

## 2022-12-27 ENCOUNTER — Encounter: Payer: Self-pay | Admitting: Physical Therapy

## 2022-12-27 VITALS — BP 118/64 | HR 64 | Resp 16 | Ht 60.0 in | Wt 136.8 lb

## 2022-12-27 DIAGNOSIS — M792 Neuralgia and neuritis, unspecified: Secondary | ICD-10-CM

## 2022-12-27 DIAGNOSIS — M542 Cervicalgia: Secondary | ICD-10-CM | POA: Diagnosis not present

## 2022-12-27 DIAGNOSIS — R519 Headache, unspecified: Secondary | ICD-10-CM | POA: Diagnosis not present

## 2022-12-27 DIAGNOSIS — I2699 Other pulmonary embolism without acute cor pulmonale: Secondary | ICD-10-CM

## 2022-12-27 DIAGNOSIS — Z23 Encounter for immunization: Secondary | ICD-10-CM

## 2022-12-27 DIAGNOSIS — G4733 Obstructive sleep apnea (adult) (pediatric): Secondary | ICD-10-CM

## 2022-12-27 DIAGNOSIS — J9611 Chronic respiratory failure with hypoxia: Secondary | ICD-10-CM | POA: Diagnosis not present

## 2022-12-27 NOTE — Progress Notes (Signed)
   Subjective:    Patient ID: April [name], female    DOB: Aug 04, 1940, 82 y.o.   MRN: [id number]  HPI  82 yo  ex-smoker with tracheobroncho- malacia & mild OSA Improved with BiPAP PMH - large Hiatal hernia.  diabetes mellitus type 2  PE 04/2022 on CT abdomen-due to relative immobility from L1 fracture and right foot  fracture ,also covid in dec 2023    Current BiPAP machine since 2018  Chief Complaint  Patient presents with   Follow-up    Doing alright. Needs a new machine. Was told she needed a sleep study prior to new machine per DME.    She had an eventful year, her husband passed away Feb 12, 2024he refused dialysis  Admitted 04/2022 for pulm embolism -she remains on apixaban Adm 06/2022 presented with intermittent confusion for few days, diarrhea and nausea and vomiting.  In the ED, patient was also noted to have hypoxia and required 2 L of oxygen by nasal cannula.  Chest x-ray done in the ED showed atypical pneumonia versus CHF.  She feels this was related to migraine medication  She is compliant with her BiPAP machine, accompanied by her daughter-in-law who corroborates history.  She requested a replacement machine and replaced the order but insurance denied it since there is no sleep study on record.  She has oxygen at home but is not using.  CT abdomen has suggested reticular markings  Significant tests/ events reviewed   08/2012 sniff test negative   HRCT 03/2007 & 05/2008 did not show any evidence of pulmonary fibrosis . Large hiatal hernia was noted.    82ly showed intraparenchymal restriction with a diffuse capacity of 49%. PFTs 09/2007 >> marked improvment, FEV1% was 78, FEV68 %, FVC 63%, TLC 67%, DLCO remains 51%.  PSG  did reveal mild obstructive sleep apnea. On BiPAP +10/5 & is able to sleep supine.  Echo- no rt--> LT shunt    Feb, 2012 - Hoarseness resolved - lisinopril stopped, neg ENt evaluation Jearld Fenton)  06/17/2010 -ONO on bipap/ RA desaturation for  about 6 mins -    08/22/2012 spirometry >> FVC 75% , mild restriction - improved slightly  Review of Systems neg for any significant sore throat, dysphagia, itching, sneezing, nasal congestion or excess/ purulent secretions, fever, chills, sweats, unintended wt loss, pleuritic or exertional cp, hempoptysis, orthopnea pnd or change in chronic leg swelling. Also denies presyncope, palpitations, heartburn, abdominal pain, nausea, vomiting, diarrhea or change in bowel or urinary habits, dysuria,hematuria, rash, arthralgias, visual complaints, headache, numbness weakness or ataxia.     Objective:   Physical Exam  Gen. Pleasant, well-nourished, in no distress ENT - no thrush, no pallor/icterus,no post nasal drip Neck: No JVD, no thyromegaly, no carotid bruits Lungs: no use of accessory muscles, no dullness to percussion, LLL dry rales no rhonchi  Cardiovascular: Rhythm regular, heart sounds  normal, no murmurs or gallops, no peripheral edema Musculoskeletal: No deformities, no cyanosis or clubbing        Assessment & Plan:

## 2022-12-27 NOTE — Assessment & Plan Note (Addendum)
We will reassess with a home sleep test.  She sleeps in a hospital bed with head of bed elevated and I have asked her to try to sleep flat if she can. She has clearly benefited from the BiPAP machine. She is very compliant and this is confirmed on download which shows good control of events on 10/5 without a single missed night and minimal leak.  This is clearly improved her somnolence and fatigue.  It has also enable her to lie flat and I believe positive pressure splints her airways for tracheobronchomalacia

## 2022-12-27 NOTE — Assessment & Plan Note (Signed)
She does not desaturate on ambulation today.  In the past we have checked her for pulmonary fibrosis with high-resolution CT and this was negative.  We can reconsider this in the future. Will assess with the sleep study with her she really needs oxygen

## 2022-12-27 NOTE — Assessment & Plan Note (Signed)
Okay to DC apixaban since she has completed more than 6 months and this was a provoked embolism.  I have asked her to take baby aspirin instead

## 2022-12-27 NOTE — Addendum Note (Signed)
Addended by: Jama Flavors on: 12/27/2022 11:51 AM   Modules accepted: Orders

## 2022-12-27 NOTE — Patient Instructions (Addendum)
x ambulatory saturation  Okay to discontinue blood thinner.  Start taking baby aspirin 81 mg daily  x schedule home sleep test.  Based on this we will request replacement BiPAP machine  x trial of AirFit F30 fullface mask

## 2022-12-27 NOTE — Therapy (Signed)
OUTPATIENT PHYSICAL THERAPY TREATMENT    Patient Name: April [name] MRN: [id number] DOB:[birth year], 82 y.o., female Today's Date: 12/27/2022  END OF SESSION:  PT End of Session - 12/27/22 1351     Visit Number 15    Number of Visits 20    Date for PT Re-Evaluation 01/17/23    Authorization Type [employer] MEDICARE reporting period from 910/23/2024    Progress Note Due on Visit 20    PT Start Time 1350    PT Stop Time 1430    PT Time Calculation (min) 40 min    Activity Tolerance Patient tolerated treatment well;Patient limited by pain;Patient limited by fatigue    Behavior During Therapy [facility] for tasks assessed/performed                 Past Medical History:  Diagnosis Date   Abnormal weight gain    Acute diverticulitis 05/13/2021   Arthritis    "joints" (06/13/2013)   COPD (chronic obstructive pulmonary disease) (HCC)    Coronary artery disease, non-occlusive 06/13/2013   Cardiac Cath: sharp Angle take-off of Large Dominant Cx: ~70-80% mid Cx bifurcation lesion (Lateral OM &  AVGCx-PL-PDA both with hairpin ostial & ~50% lesions) - Not PCI amenable due to vessel tortuosity; ~40-50% mid LAD;Ramus - no significnat diseaes; small non-dominant RCA   Diverticulosis    GERD (gastroesophageal reflux disease)    History of stress test    a. 09/2006 nl Dobutamine Echo   Hyperlipidemia    Hypertension    NIDDM (non-insulin dependent diabetes mellitus)    On home oxygen therapy    "2L q hs; runs into my BIPAP" (06/13/2013)   OSA (obstructive sleep apnea)    "BIPAP w/O2" (06/13/2013)   Tracheobronchomalacia    a. followed by [facility]   Past Surgical History:  Procedure Laterality Date   CARDIAC CATHETERIZATION  06/2013   Cardiac Cath: sharp Angle take-off of Large Dominant Cx: ~70-80% mid Cx bifurcation lesion (Lateral OM &  AVGCx-PL-PDA both with hairpin ostial & ~50% lesions) - Not PCI amenable due to vessel tortuosity; ~40-50% mid LAD;Ramus - no significnat diseaes; small  non-dominant RCA   LEFT HEART CATHETERIZATION WITH CORONARY ANGIOGRAM N/A 06/16/2013   Procedure: LEFT HEART CATHETERIZATION WITH CORONARY ANGIOGRAM;  Surgeon: Marykay Lex, MD;  Location: [facility] CATH LAB;  Service: Cardiovascular;  Laterality: N/A;   TRANSTHORACIC ECHOCARDIOGRAM  06/17/2013   Normal LV size and function. EF 55-60% with no regional WMA. Grade 1 diastolic dysfunction. No significant valvular lesions   TUBAL LIGATION     Patient Active Problem List   Diagnosis Date Noted   Excessive body weight loss 11/19/2022   Falls 05/01/2022   Aortic stenosis, mild 04/20/2022   Pulmonary embolism (HCC) 04/18/2022   Cholelithiasis 04/18/2022   Wheezing 04/18/2022   Chronic respiratory failure with hypoxia (HCC) 04/18/2022   Age-related osteoporosis with current pathological fracture, vertebra(e), initial encounter for fracture (HCC) 04/04/2022   Inflammation of sacroiliac joint (HCC) 04/04/2022   Polyneuropathy 04/04/2022   Subacute cough 03/28/2022   Pedal edema 11/04/2020   Scoliosis of lumbar spine 08/04/2020   Lumbar spondylosis 08/04/2020   Radiculopathy, lumbar region 05/19/2020   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) 07/01/2013   Hypothyroid 06/16/2013   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   Unstable angina - history of 06/13/2013   Hyperlipidemia associated with type 2 diabetes mellitus (HCC)    GERD (gastroesophageal reflux disease)    Essential hypertension    Type  2 diabetes mellitus treated with insulin (HCC)    Dyspnea    Diverticulosis    HOARSENESS 06/30/2009   OSA treated with BiPAP 01/01/2007   WEIGHT GAIN 01/01/2007    PCP:  Donita Brooks, MD  REFERRING PROVIDER: Ocie Doyne, MD   REFERRING DIAG: cervicalgia  THERAPY DIAG:  Cervicalgia  Nonintractable headache, unspecified chronicity pattern, unspecified headache type  Neuralgia and neuritis  Rationale for Evaluation and Treatment:  Rehabilitation  ONSET DATE: Christmas season 2023.   PERTINENT HISTORY:  Patient is a 82 y.o. female who presents to outpatient physical therapy with a referral for medical diagnosis cervicalgia. This patient's chief complaints consist of left sided neck pain and stiffness and headache that is worst near the mastoid process and intermittently radiates to the top of her head, temple, and behind left eye, and left UE pain leading to the following functional deficits: difficulty feeling like "doing much of nothing," watching TV, being with family, crosswords, play solitaire on her phone, turning her head, flexing or extending neck, prolonged sitting, traveling. 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 She  has a past medical history of Abnormal weight gain, Acute diverticulitis (05/13/2021), Arthritis, COPD (chronic obstructive pulmonary disease) (HCC), Coronary artery disease, non-occlusive (06/13/2013), Diverticulosis, GERD (gastroesophageal reflux disease), History of stress test,  Hyperlipidemia, Hypertension, NIDDM (non-insulin dependent diabetes mellitus), OSA (obstructive sleep apnea), and Tracheobronchomalacia. She  has a past surgical history that includes Tubal ligation; Cardiac catheterization (06/2013); transthoracic echocardiogram (06/17/2013); and left heart catheterization with coronary angiogram (N/A, 06/16/2013).  Patient/DIL denies hx of cancer, stroke, seizures, unexplained weight loss, unexplained changes in bowel or bladder problems, unexplained stumbling or dropping things, and spinal surgery.  SUBJECTIVE:  SUBJECTIVE STATEMENT: Patient arrives with SPC accompanied by DIL [name]. They say she felt better right after the PT session, but the later had increased pain and she thinks this was worse. She did some of her new exercises yesterday and didn't feel any worse. [name] states she has been doing her HEP at home. She is still reporting painful popping. [name] states the doctor who did the injection is working on getting patient in to see someone else for more testing.   PAIN:  NPRS: 6/10 behind left ear radiating to the top of the left side of her head   PRECAUTIONS: Fall  PATIENT GOALS: "want to get her head to quit hurting and would like to get my back to quit hurting"   NEXT MD VISIT: 6 months from the last visit  OBJECTIVE   SELF-REPORTED FUNCTION FOTO score: 56/100 (neck questionnaire)   SPINE MOTION CERVICAL SPINE AROM *Indicates pain Rotation:  R 50 left concordant pain L 50 left concordant pain   FUNCTIONAL TEST Deep Neck Flexor Endurance Test: 41 seconds - incomplete chin tuck   TODAY'S TREATMENT: Therapeutic exercise: to centralize symptoms and improve ROM, strength, muscular endurance, and activity tolerance required for successful  completion of functional activities.  - cervical spine AROM testing (see above) - deep neck flexor endurance test to assess progress (see above) - hooklying B shoulder abduction with scapular retraction, 1x10 with BlackTB (cuing for technique, scapular retraction, posterior tilt, depression) - hooklying D2 flexion with BlackTB, 1x10 each side (cuing for technique, scapular retraction, posterior tilt, depression).  - standing scapular retraction with B shoulder extension against BlackTB pulled across front of hip.  - sit <> stand from 18.5 inch plinth with pillow on it without UE support, 1x10.  - Education on HEP including handout   Pt required multimodal cuing for proper technique and to facilitate improved neuromuscular control, strength, range of motion, and functional ability resulting in improved performance and form.  PATIENT EDUCATION:  Education details: Exercise purpose/form. Self management techniques. Discharge  Person educated: Patient and Caregiver DIL [name] Education method: Explanation, Demonstration, Tactile cues, Verbal cues, and Handouts Education comprehension: verbalized understanding, returned demonstration, and needs further education  HOME EXERCISE PROGRAM: Access Code: [email or web address] URL: [email or web address] Date: 12/27/2022 Prepared by: Norton Blizzard  Exercises - Sit to Stand Without Arm Support  - 1 x daily - 3 sets - 10 reps - Seated Cervical Retraction  - 1 x daily - 3 sets - 10 reps - 1 seconds hold - Seated Assisted Cervical Rotation with Towel  - 1 x daily - 2 sets - 10 reps - 5 seconds hold - Sidelying Thoracic Rotation with Open Book  - 1 x daily - 1-2 sets - 10 reps - 5 seconds hold - Supine Deep Neck Flexor Training - Repetitions  - 3-4 x weekly - 3 sets - 10 reps - Supine Shoulder Horizontal Abduction with Resistance  - 3-4 x weekly - 3 sets - 10 reps - Supine PNF D2 Flexion with Resistance  - 3-4 x weekly - 3 sets - 10 reps - Staggered  Stance Scapular Retraction and Depression with Resistance  - 1 x daily - 3-4 x weekly - 3 sets - 10 reps - Recumbent Bike  - 3-5 x weekly - 10-30 min time  ASSESSMENT:  CLINICAL IMPRESSION: Patient has attended 15 physical therapy sessions since starting current episode of care on 10/25/2022. She initially seemed to be improving with HEP and dry needling sessions, but had a setback  after missing PT and HEP while sick with pneumonia. Since then her improvement with dry needling has been temporary. She has met her short and long term HEP, FOTO goal, and deep neck flexor endurance test goal. Unfortunately her ROM and self-reported functional limitations have not improved. She is now being discharged to long term HEP (with the help of her family) for long term postural strengthening and reccommended to return to doctor for further medical assessment or treatment options.   OBJECTIVE IMPAIRMENTS: Abnormal gait, decreased activity tolerance, decreased cognition, decreased coordination, decreased endurance, decreased knowledge of condition, decreased mobility, difficulty walking, decreased ROM, decreased strength, hypomobility, impaired perceived functional ability, increased muscle spasms, impaired flexibility, impaired UE functional use, improper body mechanics, postural dysfunction, and pain.   ACTIVITY LIMITATIONS: carrying, lifting, sitting, sleeping, transfers, bed mobility, bathing, dressing, hygiene/grooming, and locomotion level  PARTICIPATION LIMITATIONS: meal prep, cleaning, interpersonal relationship, shopping, community activity, and   difficulty feeling like "doing much of nothing," watching TV, being with family, crosswords, play solitaire on her phone, turning her head, flexing or extending neck, prolonged sitting, traveling  PERSONAL FACTORS: Age, Fitness, Past/current experiences, Time since onset of injury/illness/exacerbation, and 3+ comorbidities:   OSA treated with BiPAP; WEIGHT GAIN;  HOARSENESS; Hyperlipidemia associated with type 2 diabetes mellitus (HCC); GERD (gastroesophageal reflux disease); Essential hypertension; Type 2 diabetes mellitus treated with insulin (HCC); Dyspnea; Diverticulosis; Unstable angina - history of; Hypothyroid; Tracheobronchomalacia; 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 She  has a past medical history of Abnormal weight gain, Acute diverticulitis (05/13/2021), Arthritis, COPD (chronic obstructive pulmonary disease) (HCC), Coronary artery disease, non-occlusive (06/13/2013), Diverticulosis, GERD (gastroesophageal reflux disease), History of stress test, Hyperlipidemia, Hypertension, NIDDM (non-insulin dependent diabetes mellitus), OSA (obstructive sleep apnea), and Tracheobronchomalacia. She  has a past surgical history that includes Tubal ligation; Cardiac catheterization (06/2013); transthoracic echocardiogram (06/17/2013); and left heart catheterization with coronary angiogram (N/A, 06/16/2013) are also affecting patient's functional outcome.   REHAB POTENTIAL: Good  CLINICAL DECISION MAKING: Evolving/moderate complexity  EVALUATION COMPLEXITY: Moderate   GOALS: Goals reviewed with patient? No  SHORT TERM GOALS: Target date: 11/08/2022  Patient will be independent with initial home exercise program for self-management of symptoms. Baseline: Initial HEP provided at IE (10/25/22); Goal status:  MET   LONG TERM GOALS: Target date: 01/17/2023  Patient will be independent with a long-term home exercise program for self-management of symptoms.  Baseline: Initial HEP provided at IE (10/25/22); participating fair - less after pneumonia (12/06/2022); provided with long term HEP and participating (12/27/2022);  Goal status: MET  2.  Patient will demonstrate improved FOTO by equal or greater than 10 points by visit #10 to demonstrate improvement in overall condition and self-reported functional ability.  Baseline: to be measured visit 2 as appropriate (10/25/22); 39 at visit #2 (10/30/2022); 52 at visit #10 (12/06/2022);  Goal status: MET (score of 52 on 12/06/22)  3.  Patient will improve cervical spine rotation to equal or greater than 60 degrees each direction without increased pain to improve ability to view her surroundings without difficulty.  Baseline: R/L 67/50 with concordant pain (10/25/22); R/L 50/50 with concordant pain (  12/06/22); R/L 50/50 with concordant pain (12/27/22); Goal status: NOT MET  4.  Patient will score equal or greater than 26 seconds on the Deep Neck Flexor Endurance Test to demonstrate improved neck strength/endurance and reach age matched norm in healthy women to improve her ability to complete her daily activities with less difficulty due to neck pain associated with fatigue.  Baseline: to be tested visit 2 as appropriate (10/25/22); 48 seconds (12/06/22); 41 seconds at visit #15 (12/27/2022);  Goal status: MET (48 seconds on 12/06/22)  5.  Patient will complete community, work and/or recreational activities with 50% less limitation due to current condition.  Baseline: difficulty feeling like "doing much of nothing," watching TV, being with family, crosswords, play solitaire on her phone, turning her head, flexing or extending neck, prolonged sitting, traveling (10/25/22); deferred due to patient having difficulty distinguishing from current symptoms and overall  symptoms/function (12/06/2022);  patient reports she is walking better, getting out of the chair better, stamina is better but her neck - related things are not better (12/27/2022);  Goal status: Not MET   PLAN:  PT FREQUENCY: 1-2x/week  PT DURATION: 12 weeks  PLANNED INTERVENTIONS: Therapeutic exercises, Therapeutic activity, Neuromuscular re-education, Patient/Family education, Self Care, DME instructions, Dry Needling, Electrical stimulation, Cryotherapy, Moist heat, Manual therapy, and Re-evaluation  PLAN FOR NEXT SESSION: patient is now discharged from PT   Yolinda Duerr R. Ilsa Iha, PT, DPT 12/27/22, 3:15 PM  [facility] [address] [place], [place] [address] P: [phone] I F: [phone]

## 2023-01-10 DIAGNOSIS — G4730 Sleep apnea, unspecified: Secondary | ICD-10-CM | POA: Diagnosis not present

## 2023-01-18 ENCOUNTER — Encounter: Payer: Self-pay | Admitting: Family Medicine

## 2023-01-18 ENCOUNTER — Ambulatory Visit (INDEPENDENT_AMBULATORY_CARE_PROVIDER_SITE_OTHER): Payer: Medicare Other | Admitting: Family Medicine

## 2023-01-18 VITALS — BP 120/76 | HR 66 | Temp 97.8°F | Ht 60.0 in | Wt 137.7 lb

## 2023-01-18 DIAGNOSIS — I5189 Other ill-defined heart diseases: Secondary | ICD-10-CM | POA: Diagnosis not present

## 2023-01-18 DIAGNOSIS — Z0001 Encounter for general adult medical examination with abnormal findings: Secondary | ICD-10-CM | POA: Diagnosis not present

## 2023-01-18 DIAGNOSIS — Z0000 Encounter for general adult medical examination without abnormal findings: Secondary | ICD-10-CM

## 2023-01-18 DIAGNOSIS — M542 Cervicalgia: Secondary | ICD-10-CM

## 2023-01-18 DIAGNOSIS — Z780 Asymptomatic menopausal state: Secondary | ICD-10-CM | POA: Diagnosis not present

## 2023-01-18 DIAGNOSIS — E119 Type 2 diabetes mellitus without complications: Secondary | ICD-10-CM | POA: Diagnosis not present

## 2023-01-18 DIAGNOSIS — Z7984 Long term (current) use of oral hypoglycemic drugs: Secondary | ICD-10-CM

## 2023-01-18 DIAGNOSIS — E1169 Type 2 diabetes mellitus with other specified complication: Secondary | ICD-10-CM | POA: Diagnosis not present

## 2023-01-18 DIAGNOSIS — I2699 Other pulmonary embolism without acute cor pulmonale: Secondary | ICD-10-CM

## 2023-01-18 NOTE — Progress Notes (Signed)
Subjective:    Patient ID: April [name], female    DOB: 1940/04/19, 82 y.o.   MRN: [id number] Patient is an 82 year old Caucasian female here today for a complete physical exam.  She also continues to have a daily occipital headache.  The pain originates on the left side of her neck and radiates into the left occiput.  I initially tried the patient on Topamax and then gabapentin.  These medications did not help.  I then referred the patient to neurology.  They recommended physical therapy.  They also tried trigger point injections without relief.  She is here today requesting assistance in managing her headaches.  They seem to be neuralgiform in nature and originate in the left side of the neck.  Her mammogram is up-to-date.  She is due for a bone density test.  She states that her blood sugars have been much higher since starting Jardiance.  She is no longer on insulin.  She states that her sugars are typically 160.  She denies any blood sugars over 200.  She denies any hypoglycemic episodes.  Diabetic foot exam was performed today and is normal.  The patient is due for COVID shot, and RSV shot, and a shingles vaccine. Past Medical History:  Diagnosis Date   Abnormal weight gain    Acute diverticulitis 05/13/2021   Arthritis    "joints" (06/13/2013)   COPD (chronic obstructive pulmonary disease) (HCC)    Coronary artery disease, non-occlusive 06/13/2013   Cardiac Cath: sharp Angle take-off of Large Dominant Cx: ~70-80% mid Cx bifurcation lesion (Lateral OM &  AVGCx-PL-PDA both with hairpin ostial & ~50% lesions) - Not PCI amenable due to vessel tortuosity; ~40-50% mid LAD;Ramus - no significnat diseaes; small non-dominant RCA   Diverticulosis    GERD (gastroesophageal reflux disease)    History of stress test    a. 09/2006 nl Dobutamine Echo   Hyperlipidemia    Hypertension    NIDDM (non-insulin dependent diabetes mellitus)    On home oxygen therapy    "2L q hs; runs into my BIPAP" (06/13/2013)    OSA (obstructive sleep apnea)    "BIPAP w/O2" (06/13/2013)   Tracheobronchomalacia    a. followed by [facility].   Past Surgical History:  Procedure Laterality Date   CARDIAC CATHETERIZATION  06/2013   Cardiac Cath: sharp Angle take-off of Large Dominant Cx: ~70-80% mid Cx bifurcation lesion (Lateral OM &  AVGCx-PL-PDA both with hairpin ostial & ~50% lesions) - Not PCI amenable due to vessel tortuosity; ~40-50% mid LAD;Ramus - no significnat diseaes; small non-dominant RCA   LEFT HEART CATHETERIZATION WITH CORONARY ANGIOGRAM N/A 06/16/2013   Procedure: LEFT HEART CATHETERIZATION WITH CORONARY ANGIOGRAM;  Surgeon: Marykay Lex, MD;  Location: [facility] CATH LAB;  Service: Cardiovascular;  Laterality: N/A;   TRANSTHORACIC ECHOCARDIOGRAM  06/17/2013   Normal LV size and function. EF 55-60% with no regional WMA. Grade 1 diastolic dysfunction. No significant valvular lesions   TUBAL LIGATION     Current Outpatient Medications on File Prior to Visit  Medication Sig Dispense Refill   albuterol (VENTOLIN HFA) 108 (90 Base) MCG/ACT inhaler Inhale 2 puffs into the lungs every 6 (six) hours as needed for wheezing or shortness of breath. 8 g 0   calcitonin, salmon, (MIACALCIN/FORTICAL) 200 UNIT/ACT nasal spray Place into alternate nostrils.     Calcium Carbonate-Vitamin D (CALCIUM 600 + D PO) Take 1 tablet by mouth daily.     empagliflozin (JARDIANCE) 10 MG TABS tablet Take 1  tablet (10 mg total) by mouth daily before breakfast. 30 tablet 3   escitalopram (LEXAPRO) 10 MG tablet TAKE ONE TABLET BY MOUTH ONCE A DAY 30 tablet 3   gabapentin (NEURONTIN) 300 MG capsule Take 300 mg in the afternoon. Take 600 mg pill in AM and PM for a total of 600 mg in the morning, 300 mg in the afternoon, and 600 mg at bedtime 90 capsule 2   gabapentin (NEURONTIN) 600 MG tablet TAKE ONE TABLET BY MOUTH FOUR TIMES DAILY 360 tablet 0   glucose blood (ONETOUCH VERIO) test strip USE AS DIRECTED TO MONITOR BLOOD SUGARS UP TO THREE  TIMES DAILY AS NEEDED 100 each 3   isosorbide mononitrate (IMDUR) 60 MG 24 hr tablet TAKE ONE TABLET BY MOUTH ONCE DAILY. (NEED APPT FOR FUTURE REFILLS) 30 tablet 11   LANTUS SOLOSTAR 100 UNIT/ML Solostar Pen INJECT 40-50 UNITS UNDER THE SKIN ONCE EVERY NIGHT AT BEDTIME. 15 mL 3   metFORMIN (GLUCOPHAGE) 500 MG tablet TAKE ONE TABLET BY MOUTH TWICE (2) DAILY WITH A MEAL 180 tablet 1   metoprolol tartrate (LOPRESSOR) 25 MG tablet TAKE ONE TABLET BY MOUTH TWICE A DAY 180 tablet 3   Omega-3 Fatty Acids (FISH OIL) 1000 MG CAPS Take 1 capsule by mouth daily.     omeprazole (PRILOSEC) 20 MG capsule Take 20 mg by mouth daily.     potassium chloride (KLOR-CON) 10 MEQ tablet Take 10 mEq by mouth 2 (two) times daily.     rosuvastatin (CRESTOR) 20 MG tablet Take 1 tablet (20 mg total) by mouth daily. 90 tablet 3   traMADol (ULTRAM) 50 MG tablet TAKE ONE TABLET BY MOUTH AT BEDTIME AS NEEDED FOR SEVERE PAIN 30 tablet 0   triamcinolone (KENALOG) 0.1 % paste Use as directed 1 Application in the mouth or throat 2 (two) times daily. 5 g 12   Current Facility-Administered Medications on File Prior to Visit  Medication Dose Route Frequency Provider Last Rate Last Admin   bupivacaine (MARCAINE) 0.25 % (with pres) injection 1.5 mL  1.5 mL Other Once Lomax, Amy, NP       lidocaine (XYLOCAINE) 2 % (with pres) injection 30 mg  1.5 mL Intradermal Once Lomax, Amy, NP       Allergies  Allergen Reactions   Neomycin Other (See Comments)    Visual disturbance and swelling in eyes   Social History   Socioeconomic History   Marital status: Married    Spouse name: Not on file   Number of children: 3   Years of education: Not on file   Highest education level: Not on file  Occupational History   Occupation: Engineer, petroleum  Tobacco Use   Smoking status: Former    Current packs/day: 0.00    Average packs/day: 1.5 packs/day for 20.0 years (30.0 ttl pk-yrs)    Types: Cigarettes    Start date: 02/13/1966    Quit date:  02/13/1986    Years since quitting: 36.9   Smokeless tobacco: Never  Vaping Use   Vaping status: Never Used  Substance and Sexual Activity   Alcohol use: No   Drug use: No   Sexual activity: Yes  Other Topics Concern   Not on file  Social History Narrative   Lives in [place] with her husband. 3 children.  Works is Futures trader.Former 30 Pk/yr Smoker (1.5 PPD x 20 yr) - quit in 1998.      Right handed    Wear glasses  Drinks coffee daily (1 cup in am)   Drinks soda caffeine free (coke)   Social Determinants of Health   Financial Resource Strain: Low Risk  (07/20/2022)   Overall Financial Resource Strain (CARDIA)    Difficulty of Paying Living Expenses: Not hard at all  Food Insecurity: No Food Insecurity (07/20/2022)   Hunger Vital Sign    Worried About Running Out of Food in the Last Year: Never true    Ran Out of Food in the Last Year: Never true  Transportation Needs: No Transportation Needs (07/20/2022)   PRAPARE - Administrator, Civil Service (Medical): No    Lack of Transportation (Non-Medical): No  Physical Activity: Inactive (07/20/2022)   Exercise Vital Sign    Days of Exercise per Week: 0 days    Minutes of Exercise per Session: 0 min  Stress: No Stress Concern Present (07/20/2022)   [employer] of Occupational Health - Occupational Stress Questionnaire    Feeling of Stress : Not at all  Social Connections: Moderately Integrated (07/20/2022)   Social Connection and Isolation Panel [NHANES]    Frequency of Communication with Friends and Family: More than three times a week    Frequency of Social Gatherings with Friends and Family: Three times a week    Attends Religious Services: More than 4 times per year    Active Member of Clubs or Organizations: No    Attends Banker Meetings: Never    Marital Status: Married  Catering manager Violence: Not At Risk (07/20/2022)   Humiliation, Afraid, Rape, and Kick questionnaire    Fear of Current  or Ex-Partner: No    Emotionally Abused: No    Physically Abused: No    Sexually Abused: No      Review of Systems  Musculoskeletal:  Positive for back pain.  All other systems reviewed and are negative.      Objective:   Physical Exam Constitutional:      General: She is not in acute distress.    Appearance: Normal appearance. She is obese. She is not ill-appearing, toxic-appearing or diaphoretic.  HENT:     Head: Normocephalic and atraumatic.      Right Ear: External ear normal. There is no impacted cerumen.     Left Ear: External ear normal. There is no impacted cerumen.     Nose: Nose normal. No congestion or rhinorrhea.     Mouth/Throat:     Mouth: Mucous membranes are moist.     Pharynx: Oropharynx is clear.  Eyes:     General: No scleral icterus.       Right eye: No discharge.        Left eye: No discharge.     Extraocular Movements: Extraocular movements intact.     Conjunctiva/sclera: Conjunctivae normal.     Pupils: Pupils are equal, round, and reactive to light.  Neck:     Vascular: No carotid bruit.  Cardiovascular:     Rate and Rhythm: Normal rate and regular rhythm.     Pulses: Normal pulses.     Heart sounds: Murmur heard.     No friction rub. No gallop.  Pulmonary:     Effort: Pulmonary effort is normal. No respiratory distress.     Breath sounds: No stridor. Rales present. No wheezing or rhonchi.  Chest:     Chest wall: No tenderness.  Abdominal:     General: Abdomen is flat. Bowel sounds are normal. There is no distension.  Palpations: Abdomen is soft.     Tenderness: There is no abdominal tenderness. There is no right CVA tenderness, left CVA tenderness, guarding or rebound.     Hernia: No hernia is present.  Musculoskeletal:     Cervical back: Normal range of motion and neck supple. No rigidity.     Right lower leg: No edema.     Left lower leg: No edema.  Lymphadenopathy:     Cervical: No cervical adenopathy.  Skin:    Findings: No  bruising, erythema, lesion or rash.  Neurological:     Mental Status: She is alert.     Gait: Gait abnormal.     Deep Tendon Reflexes: Reflexes normal.           Assessment & Plan:  Postmenopausal estrogen deficiency - Plan: DG Bone Density  Diastolic dysfunction  Type 2 diabetes mellitus treated without insulin (HCC) - Plan: CBC with Differential/Platelet, COMPLETE METABOLIC PANEL WITH GFR, Hemoglobin A1c  Neck pain on left side - Plan: DG Cervical Spine Complete  Pulmonary embolism and infarction [facility])  General medical exam Mammogram is up-to-date.  Schedule the patient for a bone density test.  Due to age, she does not require colonoscopy or a Pap smear.  Check CBC CMP and an A1c.  If A1c is less than 8, I believe that this is sufficient given her age.  Continue Jardiance due to diastolic dysfunction.  The patient has completed 6 months of anticoagulation on Eliquis for her pulmonary embolism.  Discontinue Eliquis especially given her recent falls.  Schedule the patient for an x-ray of the neck to evaluate for degenerative disc disease and if severe, proceed with an MRI of the neck to determine if she would benefit from epidural steroid injections.  Recommended COVID shot as well as an RSV shot and a shingles vaccine.

## 2023-01-19 LAB — CBC WITH DIFFERENTIAL/PLATELET
Absolute Lymphocytes: 2423 {cells}/uL (ref 850–3900)
Absolute Monocytes: 426 {cells}/uL (ref 200–950)
Basophils Absolute: 21 {cells}/uL (ref 0–200)
Basophils Relative: 0.4 %
Eosinophils Absolute: 57 {cells}/uL (ref 15–500)
Eosinophils Relative: 1.1 %
HCT: 45.4 % — ABNORMAL HIGH (ref 35.0–45.0)
Hemoglobin: 14.2 g/dL (ref 11.7–15.5)
MCH: 29.2 pg (ref 27.0–33.0)
MCHC: 31.3 g/dL — ABNORMAL LOW (ref 32.0–36.0)
MCV: 93.2 fL (ref 80.0–100.0)
MPV: 11 fL (ref 7.5–12.5)
Monocytes Relative: 8.2 %
Neutro Abs: 2272 {cells}/uL (ref 1500–7800)
Neutrophils Relative %: 43.7 %
Platelets: 208 10*3/uL (ref 140–400)
RBC: 4.87 10*6/uL (ref 3.80–5.10)
RDW: 12.5 % (ref 11.0–15.0)
Total Lymphocyte: 46.6 %
WBC: 5.2 10*3/uL (ref 3.8–10.8)

## 2023-01-19 LAB — COMPLETE METABOLIC PANEL WITH GFR
AG Ratio: 1.8 (calc) (ref 1.0–2.5)
ALT: 19 U/L (ref 6–29)
AST: 19 U/L (ref 10–35)
Albumin: 4.2 g/dL (ref 3.6–5.1)
Alkaline phosphatase (APISO): 57 U/L (ref 37–153)
BUN/Creatinine Ratio: 39 (calc) — ABNORMAL HIGH (ref 6–22)
BUN: 26 mg/dL — ABNORMAL HIGH (ref 7–25)
CO2: 31 mmol/L (ref 20–32)
Calcium: 10.1 mg/dL (ref 8.6–10.4)
Chloride: 102 mmol/L (ref 98–110)
Creat: 0.67 mg/dL (ref 0.60–0.95)
Globulin: 2.4 g/dL (ref 1.9–3.7)
Glucose, Bld: 249 mg/dL — ABNORMAL HIGH (ref 65–99)
Potassium: 5.7 mmol/L — ABNORMAL HIGH (ref 3.5–5.3)
Sodium: 140 mmol/L (ref 135–146)
Total Bilirubin: 0.3 mg/dL (ref 0.2–1.2)
Total Protein: 6.6 g/dL (ref 6.1–8.1)
eGFR: 88 mL/min/{1.73_m2} (ref >=60)

## 2023-01-19 LAB — HEMOGLOBIN A1C
Hgb A1c MFr Bld: 7.9 %{Hb} — ABNORMAL HIGH (ref <5.7)
Mean Plasma Glucose: 180 mg/dL
eAG (mmol/L): 10 mmol/L

## 2023-01-21 DIAGNOSIS — J9601 Acute respiratory failure with hypoxia: Secondary | ICD-10-CM | POA: Diagnosis not present

## 2023-01-24 ENCOUNTER — Other Ambulatory Visit: Payer: Self-pay | Admitting: Family Medicine

## 2023-01-25 ENCOUNTER — Other Ambulatory Visit: Payer: Medicare Other

## 2023-01-25 DIAGNOSIS — R635 Abnormal weight gain: Secondary | ICD-10-CM | POA: Diagnosis not present

## 2023-01-25 DIAGNOSIS — I10 Essential (primary) hypertension: Secondary | ICD-10-CM | POA: Diagnosis not present

## 2023-01-25 DIAGNOSIS — R4182 Altered mental status, unspecified: Secondary | ICD-10-CM

## 2023-01-25 DIAGNOSIS — I5189 Other ill-defined heart diseases: Secondary | ICD-10-CM

## 2023-01-26 ENCOUNTER — Telehealth: Payer: Self-pay | Admitting: Psychiatry

## 2023-01-26 LAB — BASIC METABOLIC PANEL
BUN: 20 mg/dL (ref 7–25)
CO2: 30 mmol/L (ref 20–32)
Calcium: 10.3 mg/dL (ref 8.6–10.4)
Chloride: 104 mmol/L (ref 98–110)
Creat: 0.7 mg/dL (ref 0.60–0.95)
Glucose, Bld: 157 mg/dL — ABNORMAL HIGH (ref 65–99)
Potassium: 4.9 mmol/L (ref 3.5–5.3)
Sodium: 142 mmol/L (ref 135–146)

## 2023-01-26 NOTE — Telephone Encounter (Signed)
Pt's daughter states the injection given to pt 12/18/22 does not seem to be working and would like to speak about other options. Requesting call back

## 2023-01-29 NOTE — Telephone Encounter (Signed)
Attempted to call daughter at her home #.  No answer.

## 2023-01-29 NOTE — Telephone Encounter (Addendum)
I called daughter of pt.  She said that her mother noted no difference at all in the occipital nerve block  and would like to proceed with referral.    Call her daughters home # [phone].

## 2023-01-30 NOTE — Telephone Encounter (Signed)
I called daughter at home and mobile no answer.

## 2023-01-31 NOTE — Telephone Encounter (Signed)
LMVM for daughter mobile.  That I had tried her home #. But was not able to LM.  Wanted to confirm that if PT was what she was asking about, there may be another option at [facility], let me know.

## 2023-02-01 ENCOUNTER — Ambulatory Visit: Payer: Medicare Other

## 2023-02-01 ENCOUNTER — Telehealth: Payer: Self-pay | Admitting: Pulmonary Disease

## 2023-02-01 DIAGNOSIS — G4733 Obstructive sleep apnea (adult) (pediatric): Secondary | ICD-10-CM

## 2023-02-01 NOTE — Telephone Encounter (Signed)
HST showed mild OSA with AHI 9/ hr but low sat 81% & 116 min with low sat Please provide Rx to DME  for replacement biPAP 10/5

## 2023-02-02 NOTE — Telephone Encounter (Signed)
Order sent.

## 2023-02-13 ENCOUNTER — Other Ambulatory Visit: Payer: Self-pay | Admitting: Family Medicine

## 2023-02-19 ENCOUNTER — Encounter: Payer: Self-pay | Admitting: Family Medicine

## 2023-02-21 DIAGNOSIS — J9601 Acute respiratory failure with hypoxia: Secondary | ICD-10-CM | POA: Diagnosis not present

## 2023-02-23 ENCOUNTER — Ambulatory Visit
Admission: RE | Admit: 2023-02-23 | Discharge: 2023-02-23 | Disposition: A | Discharge status: Home / Self Care | Payer: Medicare Other | Source: Ambulatory Visit | Attending: Family Medicine | Admitting: Family Medicine

## 2023-02-23 DIAGNOSIS — M542 Cervicalgia: Secondary | ICD-10-CM | POA: Diagnosis not present

## 2023-02-26 ENCOUNTER — Encounter (HOSPITAL_BASED_OUTPATIENT_CLINIC_OR_DEPARTMENT_OTHER): Payer: Self-pay | Admitting: Pulmonary Disease

## 2023-02-27 ENCOUNTER — Ambulatory Visit (INDEPENDENT_AMBULATORY_CARE_PROVIDER_SITE_OTHER): Payer: Medicare Other | Admitting: Family Medicine

## 2023-02-27 VITALS — BP 120/68 | HR 60 | Temp 97.6°F | Ht 60.0 in | Wt 138.8 lb

## 2023-02-27 DIAGNOSIS — R0902 Hypoxemia: Secondary | ICD-10-CM

## 2023-02-27 DIAGNOSIS — R0602 Shortness of breath: Secondary | ICD-10-CM | POA: Diagnosis not present

## 2023-02-27 MED ORDER — LEVOFLOXACIN 500 MG PO TABS: 500.0000 mg | ORAL_TABLET | Freq: Every day | ORAL | 0 refills | Status: AC

## 2023-02-27 NOTE — Progress Notes (Signed)
 Subjective:    Patient ID: April [name], female    DOB: 07/14/1940, 83 y.o.   MRN: [id number]  Patient is an 83 year old Caucasian female with a congestive heart failure, COPD, coronary artery disease.  She also has a history of pulmonary embolism.  Over the last 2 weeks she reports increasing shortness of breath.  She denies any chest pain.  She denies any pleurisy.  She denies any purulent sputum or hemoptysis.  There is no peripheral edema in her legs.  She does have an occasional cough but this is at her baseline.  She denies any melena or hematochezia.  Pulse oximetry is down to 90% on room air.  On exam today she has rhonchorous breath sounds in her left lung asymmetric compared to the right lung.  She has crackles in the mid left lung and into the lung. Past Medical History:  Diagnosis Date   Abnormal weight gain    Acute diverticulitis 05/13/2021   Arthritis    joints (06/13/2013)   COPD (chronic obstructive pulmonary disease) (HCC)    Coronary artery disease, non-occlusive 06/13/2013   Cardiac Cath: sharp Angle take-off of Large Dominant Cx: ~70-80% mid Cx bifurcation lesion (Lateral OM &  AVGCx-PL-PDA both with hairpin ostial & ~50% lesions) - Not PCI amenable due to vessel tortuosity; ~40-50% mid LAD;Ramus - no significnat diseaes; small non-dominant RCA   Diverticulosis    GERD (gastroesophageal reflux disease)    History of stress test    a. 09/2006 nl Dobutamine Echo   Hyperlipidemia    Hypertension    NIDDM (non-insulin  dependent diabetes mellitus)    On home oxygen  therapy    2L q hs; runs into my BIPAP (06/13/2013)   OSA (obstructive sleep apnea)    BIPAP w/O2 (06/13/2013)   Tracheobronchomalacia    a. followed by [facility].   Past Surgical History:  Procedure Laterality Date   CARDIAC CATHETERIZATION  06/2013   Cardiac Cath: sharp Angle take-off of Large Dominant Cx: ~70-80% mid Cx bifurcation lesion (Lateral OM &  AVGCx-PL-PDA both with hairpin ostial & ~50%  lesions) - Not PCI amenable due to vessel tortuosity; ~40-50% mid LAD;Ramus - no significnat diseaes; small non-dominant RCA   LEFT HEART CATHETERIZATION WITH CORONARY ANGIOGRAM N/A 06/16/2013   Procedure: LEFT HEART CATHETERIZATION WITH CORONARY ANGIOGRAM;  Surgeon: Alm LELON Clay, MD;  Location: [facility] CATH LAB;  Service: Cardiovascular;  Laterality: N/A;   TRANSTHORACIC ECHOCARDIOGRAM  06/17/2013   Normal LV size and function. EF 55-60% with no regional WMA. Grade 1 diastolic dysfunction. No significant valvular lesions   TUBAL LIGATION     Current Outpatient Medications on File Prior to Visit  Medication Sig Dispense Refill   albuterol  (VENTOLIN  HFA) 108 (90 Base) MCG/ACT inhaler Inhale 2 puffs into the lungs every 6 (six) hours as needed for wheezing or shortness of breath. 8 g 0   calcitonin, salmon, (MIACALCIN /FORTICAL) 200 UNIT/ACT nasal spray Place into alternate nostrils.     Calcium  Carbonate-Vitamin D (CALCIUM  600 + D PO) Take 1 tablet by mouth daily.     empagliflozin  (JARDIANCE ) 10 MG TABS tablet Take 1 tablet (10 mg total) by mouth daily before breakfast. 30 tablet 3   escitalopram  (LEXAPRO ) 10 MG tablet TAKE ONE TABLET BY MOUTH ONCE A DAY 30 tablet 3   gabapentin  (NEURONTIN ) 300 MG capsule Take 300 mg in the afternoon. Take 600 mg pill in AM and PM for a total of 600 mg in the morning, 300 mg in  the afternoon, and 600 mg at bedtime 90 capsule 2   gabapentin  (NEURONTIN ) 600 MG tablet TAKE ONE TABLET BY MOUTH FOUR TIMES DAILY 360 tablet 0   glucose blood (ONETOUCH VERIO) test strip USE AS DIRECTED TO MONITOR BLOOD SUGARS UP TO THREE TIMES DAILY AS NEEDED 100 each 3   isosorbide  mononitrate (IMDUR ) 60 MG 24 hr tablet TAKE ONE TABLET BY MOUTH ONCE DAILY. (NEED APPT FOR FUTURE REFILLS) 30 tablet 11   LANTUS  SOLOSTAR 100 UNIT/ML Solostar Pen INJECT 40-50 UNITS UNDER THE SKIN ONCE EVERY NIGHT AT BEDTIME. 15 mL 3   metFORMIN  (GLUCOPHAGE ) 500 MG tablet TAKE 1 TABLET BY MOUTH TWICE A DAY WITH A  MEAL 180 tablet 1   metoprolol  tartrate (LOPRESSOR ) 25 MG tablet TAKE ONE TABLET BY MOUTH TWICE A DAY 180 tablet 3   Omega-3 Fatty Acids (FISH OIL) 1000 MG CAPS Take 1 capsule by mouth daily.     omeprazole (PRILOSEC) 20 MG capsule Take 20 mg by mouth daily.     potassium chloride  (KLOR-CON ) 10 MEQ tablet Take 10 mEq by mouth 2 (two) times daily.     traMADol  (ULTRAM ) 50 MG tablet TAKE ONE TABLET BY MOUTH AT BEDTIME AS NEEDED FOR SEVERE PAIN 30 tablet 0   triamcinolone  (KENALOG ) 0.1 % paste Use as directed 1 Application in the mouth or throat 2 (two) times daily. 5 g 12   rosuvastatin  (CRESTOR ) 20 MG tablet Take 1 tablet (20 mg total) by mouth daily. 90 tablet 3   Current Facility-Administered Medications on File Prior to Visit  Medication Dose Route Frequency Provider Last Rate Last Admin   bupivacaine  (MARCAINE ) 0.25 % (with pres) injection 1.5 mL  1.5 mL Other Once Lomax, Amy, NP       lidocaine  (XYLOCAINE ) 2 % (with pres) injection 30 mg  1.5 mL Intradermal Once Lomax, Amy, NP       Allergies  Allergen Reactions   Neomycin Other (See Comments)    Visual disturbance and swelling in eyes   Social History   Socioeconomic History   Marital status: Married    Spouse name: Not on file   Number of children: 3   Years of education: Not on file   Highest education level: Not on file  Occupational History   Occupation: Engineer, petroleum  Tobacco Use   Smoking status: Former    Current packs/day: 0.00    Average packs/day: 1.5 packs/day for 20.0 years (30.0 ttl pk-yrs)    Types: Cigarettes    Start date: 02/13/1966    Quit date: 02/13/1986    Years since quitting: 37.0   Smokeless tobacco: Never  Vaping Use   Vaping status: Never Used  Substance and Sexual Activity   Alcohol  use: No   Drug use: No   Sexual activity: Yes  Other Topics Concern   Not on file  Social History Narrative   Lives in [place] with her husband. 3 children.  Works is futures trader.Former 30 Pk/yr Smoker  (1.5 PPD x 20 yr) - quit in 1998.      Right handed    Wear glasses    Drinks coffee daily (1 cup in am)   Drinks soda caffeine free (coke)   Social Drivers of Health   Financial Resource Strain: Low Risk  (07/20/2022)   Overall Financial Resource Strain (CARDIA)    Difficulty of Paying Living Expenses: Not hard at all  Food Insecurity: No Food Insecurity (07/20/2022)   Hunger Vital Sign  Worried About Programme Researcher, Broadcasting/film/video in the Last Year: Never true    Ran Out of Food in the Last Year: Never true  Transportation Needs: No Transportation Needs (07/20/2022)   PRAPARE - Administrator, Civil Service (Medical): No    Lack of Transportation (Non-Medical): No  Physical Activity: Inactive (07/20/2022)   Exercise Vital Sign    Days of Exercise per Week: 0 days    Minutes of Exercise per Session: 0 min  Stress: No Stress Concern Present (07/20/2022)   [employer] of Occupational Health - Occupational Stress Questionnaire    Feeling of Stress : Not at all  Social Connections: Moderately Integrated (07/20/2022)   Social Connection and Isolation Panel [NHANES]    Frequency of Communication with Friends and Family: More than three times a week    Frequency of Social Gatherings with Friends and Family: Three times a week    Attends Religious Services: More than 4 times per year    Active Member of Clubs or Organizations: No    Attends Banker Meetings: Never    Marital Status: Married  Catering Manager Violence: Not At Risk (07/20/2022)   Humiliation, Afraid, Rape, and Kick questionnaire    Fear of Current or Ex-Partner: No    Emotionally Abused: No    Physically Abused: No    Sexually Abused: No      Review of Systems  Musculoskeletal:  Positive for back pain.  All other systems reviewed and are negative.      Objective:   Physical Exam Constitutional:      General: She is not in acute distress.    Appearance: Normal appearance. She is obese. She is not  ill-appearing, toxic-appearing or diaphoretic.  HENT:     Head: Normocephalic and atraumatic.      Right Ear: External ear normal. There is no impacted cerumen.     Left Ear: External ear normal. There is no impacted cerumen.     Nose: Nose normal. No congestion or rhinorrhea.     Mouth/Throat:     Mouth: Mucous membranes are moist.     Pharynx: Oropharynx is clear.  Eyes:     General: No scleral icterus.       Right eye: No discharge.        Left eye: No discharge.     Extraocular Movements: Extraocular movements intact.     Conjunctiva/sclera: Conjunctivae normal.     Pupils: Pupils are equal, round, and reactive to light.  Neck:     Vascular: No carotid bruit.  Cardiovascular:     Rate and Rhythm: Normal rate and regular rhythm.     Pulses: Normal pulses.     Heart sounds: Murmur heard.     No friction rub. No gallop.  Pulmonary:     Effort: Pulmonary effort is normal. No respiratory distress.     Breath sounds: No stridor. Examination of the left-middle field reveals rhonchi and rales. Examination of the left-lower field reveals rhonchi and rales. Rhonchi and rales present. No wheezing.  Chest:     Chest wall: No tenderness.  Abdominal:     General: Abdomen is flat. Bowel sounds are normal. There is no distension.     Palpations: Abdomen is soft.     Tenderness: There is no abdominal tenderness. There is no right CVA tenderness, left CVA tenderness, guarding or rebound.     Hernia: No hernia is present.  Musculoskeletal:     Cervical back:  Normal range of motion and neck supple. No rigidity.     Right lower leg: No edema.     Left lower leg: No edema.  Lymphadenopathy:     Cervical: No cervical adenopathy.  Skin:    Findings: No bruising, erythema, lesion or rash.  Neurological:     Mental Status: She is alert.     Gait: Gait abnormal.     Deep Tendon Reflexes: Reflexes normal.           Assessment & Plan:  Hypoxia - Plan: CBC with Differential/Platelet,  Brain natriuretic peptide, BASIC METABOLIC PANEL WITH GFR I am concerned that the patient has community-acquired pneumonia/walking pneumonia.  Check CBC CMP and BNP to evaluate for other potential causes.  Meanwhile empirically start the patient on Levaquin  500 milligrams daily for 7 days.  Reassess in 48 hours or sooner if worsening.  Obtain imaging of the lungs if the shortness of breath is not improving in the next 48 hours.

## 2023-02-28 LAB — BRAIN NATRIURETIC PEPTIDE: Brain Natriuretic Peptide: 70 pg/mL (ref <100)

## 2023-02-28 NOTE — Telephone Encounter (Signed)
I have sent urgent message to [employer] to check on this issue

## 2023-02-28 NOTE — Telephone Encounter (Signed)
 I have received a message from April [name] with [employer] [facility], Dariel Edelson   Order was recieved, processed and we are trying to reach customer to schedule her. We have made attempts to contact her but have not reached her and she has not returned our calls. She will need to call us  at [phone] to schedule her setup appt.   Numbers on file are [phone] and [phone].

## 2023-03-02 LAB — BASIC METABOLIC PANEL WITH GFR
BUN: 16 mg/dL (ref 7–25)
CO2: 29 mmol/L (ref 20–32)
Calcium: 10.1 mg/dL (ref 8.6–10.4)
Chloride: 103 mmol/L (ref 98–110)
Creat: 0.67 mg/dL (ref 0.60–0.95)
Glucose, Bld: 152 mg/dL — ABNORMAL HIGH (ref 65–99)
Potassium: 4.8 mmol/L (ref 3.5–5.3)
Sodium: 140 mmol/L (ref 135–146)
eGFR: 88 mL/min/{1.73_m2} (ref >=60)

## 2023-03-02 LAB — CBC WITH DIFFERENTIAL/PLATELET

## 2023-03-05 ENCOUNTER — Encounter: Payer: Self-pay | Admitting: Family Medicine

## 2023-03-06 ENCOUNTER — Other Ambulatory Visit: Payer: Self-pay | Admitting: Family Medicine

## 2023-03-06 DIAGNOSIS — R0602 Shortness of breath: Secondary | ICD-10-CM

## 2023-03-08 DIAGNOSIS — G4733 Obstructive sleep apnea (adult) (pediatric): Secondary | ICD-10-CM | POA: Diagnosis not present

## 2023-03-09 ENCOUNTER — Ambulatory Visit
Admission: RE | Admit: 2023-03-09 | Discharge: 2023-03-09 | Disposition: A | Discharge status: Home / Self Care | Payer: Medicare Other | Source: Ambulatory Visit | Attending: Family Medicine | Admitting: Family Medicine

## 2023-03-09 DIAGNOSIS — R0602 Shortness of breath: Secondary | ICD-10-CM

## 2023-03-19 ENCOUNTER — Other Ambulatory Visit: Payer: Self-pay | Admitting: Family Medicine

## 2023-03-24 DIAGNOSIS — J9601 Acute respiratory failure with hypoxia: Secondary | ICD-10-CM | POA: Diagnosis not present

## 2023-03-28 DIAGNOSIS — G4733 Obstructive sleep apnea (adult) (pediatric): Secondary | ICD-10-CM | POA: Diagnosis not present

## 2023-04-04 ENCOUNTER — Other Ambulatory Visit: Payer: Self-pay | Admitting: Family Medicine

## 2023-04-08 DIAGNOSIS — G4733 Obstructive sleep apnea (adult) (pediatric): Secondary | ICD-10-CM | POA: Diagnosis not present

## 2023-04-21 DIAGNOSIS — J9601 Acute respiratory failure with hypoxia: Secondary | ICD-10-CM | POA: Diagnosis not present

## 2023-04-23 ENCOUNTER — Encounter: Payer: Self-pay | Admitting: Family Medicine

## 2023-04-23 ENCOUNTER — Ambulatory Visit: Payer: Medicare Other | Admitting: Adult Health

## 2023-04-24 ENCOUNTER — Other Ambulatory Visit: Payer: Self-pay | Admitting: Family Medicine

## 2023-04-24 DIAGNOSIS — G4452 New daily persistent headache (NDPH): Secondary | ICD-10-CM

## 2023-04-25 DIAGNOSIS — G4733 Obstructive sleep apnea (adult) (pediatric): Secondary | ICD-10-CM | POA: Diagnosis not present

## 2023-05-06 DIAGNOSIS — G4733 Obstructive sleep apnea (adult) (pediatric): Secondary | ICD-10-CM | POA: Diagnosis not present

## 2023-05-10 ENCOUNTER — Other Ambulatory Visit

## 2023-05-15 DEATH — deceased
